# Patient Record
Sex: Male | Born: 1940 | Race: Black or African American | Hispanic: No | Marital: Single | Smoking: Current some day smoker
Health system: Southern US, Community
[De-identification: ages and names within clinical notes are randomized; demographics above are authoritative.]

## PROBLEM LIST (undated history)

## (undated) VITALS — BP 127/83 | HR 62 | Temp 97.0°F | Resp 18 | Ht 63.0 in | Wt 144.0 lb

## (undated) DIAGNOSIS — F419 Anxiety disorder, unspecified: Secondary | ICD-10-CM

## (undated) DIAGNOSIS — K769 Liver disease, unspecified: Secondary | ICD-10-CM

## (undated) DIAGNOSIS — E785 Hyperlipidemia, unspecified: Secondary | ICD-10-CM

## (undated) DIAGNOSIS — J449 Chronic obstructive pulmonary disease, unspecified: Secondary | ICD-10-CM

## (undated) DIAGNOSIS — K9189 Other postprocedural complications and disorders of digestive system: Secondary | ICD-10-CM

## (undated) DIAGNOSIS — M1990 Unspecified osteoarthritis, unspecified site: Secondary | ICD-10-CM

## (undated) DIAGNOSIS — M2550 Pain in unspecified joint: Secondary | ICD-10-CM

## (undated) DIAGNOSIS — K449 Diaphragmatic hernia without obstruction or gangrene: Secondary | ICD-10-CM

## (undated) DIAGNOSIS — F32A Depression, unspecified: Secondary | ICD-10-CM

## (undated) DIAGNOSIS — F329 Major depressive disorder, single episode, unspecified: Secondary | ICD-10-CM

## (undated) DIAGNOSIS — I480 Paroxysmal atrial fibrillation: Secondary | ICD-10-CM

## (undated) DIAGNOSIS — I10 Essential (primary) hypertension: Secondary | ICD-10-CM

## (undated) DIAGNOSIS — R0602 Shortness of breath: Secondary | ICD-10-CM

## (undated) DIAGNOSIS — Z933 Colostomy status: Secondary | ICD-10-CM

## (undated) DIAGNOSIS — I2510 Atherosclerotic heart disease of native coronary artery without angina pectoris: Secondary | ICD-10-CM

## (undated) DIAGNOSIS — F1010 Alcohol abuse, uncomplicated: Secondary | ICD-10-CM

## (undated) DIAGNOSIS — F99 Mental disorder, not otherwise specified: Secondary | ICD-10-CM

## (undated) DIAGNOSIS — I209 Angina pectoris, unspecified: Secondary | ICD-10-CM

## (undated) DIAGNOSIS — K567 Ileus, unspecified: Secondary | ICD-10-CM

## (undated) HISTORY — PX: ABDOMINAL SURGERY: SHX537

## (undated) HISTORY — DX: Colostomy status: Z93.3

## (undated) HISTORY — DX: Liver disease, unspecified: K76.9

## (undated) HISTORY — DX: Alcohol abuse, uncomplicated: F10.10

---

## 2005-10-03 ENCOUNTER — Emergency Department (HOSPITAL_COMMUNITY): Admission: EM | Admit: 2005-10-03 | Discharge: 2005-10-03 | Payer: Self-pay | Admitting: Emergency Medicine

## 2006-11-11 ENCOUNTER — Emergency Department (HOSPITAL_COMMUNITY): Admission: EM | Admit: 2006-11-11 | Discharge: 2006-11-11 | Payer: Self-pay | Admitting: Emergency Medicine

## 2007-05-13 ENCOUNTER — Emergency Department (HOSPITAL_COMMUNITY): Admission: EM | Admit: 2007-05-13 | Discharge: 2007-05-13 | Payer: Self-pay | Admitting: Emergency Medicine

## 2007-05-26 ENCOUNTER — Emergency Department (HOSPITAL_COMMUNITY): Admission: EM | Admit: 2007-05-26 | Discharge: 2007-05-26 | Payer: Self-pay | Admitting: *Deleted

## 2008-03-20 ENCOUNTER — Inpatient Hospital Stay (HOSPITAL_COMMUNITY): Admission: EM | Admit: 2008-03-20 | Discharge: 2008-03-20 | Payer: Self-pay | Admitting: Emergency Medicine

## 2008-08-04 ENCOUNTER — Ambulatory Visit: Payer: Self-pay | Admitting: Cardiology

## 2008-08-05 ENCOUNTER — Inpatient Hospital Stay (HOSPITAL_COMMUNITY): Admission: EM | Admit: 2008-08-05 | Discharge: 2008-08-09 | Payer: Self-pay | Admitting: Emergency Medicine

## 2008-08-05 ENCOUNTER — Encounter (INDEPENDENT_AMBULATORY_CARE_PROVIDER_SITE_OTHER): Payer: Self-pay | Admitting: Internal Medicine

## 2008-10-27 ENCOUNTER — Emergency Department (HOSPITAL_COMMUNITY): Admission: EM | Admit: 2008-10-27 | Discharge: 2008-10-27 | Payer: Self-pay | Admitting: Emergency Medicine

## 2008-12-01 ENCOUNTER — Ambulatory Visit: Payer: Self-pay | Admitting: Internal Medicine

## 2008-12-01 DIAGNOSIS — B1910 Unspecified viral hepatitis B without hepatic coma: Secondary | ICD-10-CM | POA: Insufficient documentation

## 2008-12-01 DIAGNOSIS — I10 Essential (primary) hypertension: Secondary | ICD-10-CM | POA: Insufficient documentation

## 2008-12-01 DIAGNOSIS — Z87898 Personal history of other specified conditions: Secondary | ICD-10-CM | POA: Insufficient documentation

## 2008-12-01 DIAGNOSIS — M129 Arthropathy, unspecified: Secondary | ICD-10-CM | POA: Insufficient documentation

## 2008-12-01 DIAGNOSIS — J449 Chronic obstructive pulmonary disease, unspecified: Secondary | ICD-10-CM

## 2008-12-01 DIAGNOSIS — J4489 Other specified chronic obstructive pulmonary disease: Secondary | ICD-10-CM | POA: Insufficient documentation

## 2008-12-01 DIAGNOSIS — K7030 Alcoholic cirrhosis of liver without ascites: Secondary | ICD-10-CM

## 2009-01-01 ENCOUNTER — Ambulatory Visit: Payer: Self-pay | Admitting: Internal Medicine

## 2009-03-05 ENCOUNTER — Encounter (INDEPENDENT_AMBULATORY_CARE_PROVIDER_SITE_OTHER): Payer: Self-pay | Admitting: Cardiovascular Disease

## 2009-03-05 ENCOUNTER — Observation Stay (HOSPITAL_COMMUNITY): Admission: EM | Admit: 2009-03-05 | Discharge: 2009-03-05 | Payer: Self-pay | Admitting: *Deleted

## 2009-03-24 ENCOUNTER — Emergency Department (HOSPITAL_COMMUNITY): Admission: EM | Admit: 2009-03-24 | Discharge: 2009-03-24 | Payer: Self-pay | Admitting: Emergency Medicine

## 2009-07-27 ENCOUNTER — Emergency Department (HOSPITAL_COMMUNITY): Admission: EM | Admit: 2009-07-27 | Discharge: 2009-07-27 | Payer: Self-pay | Admitting: Emergency Medicine

## 2009-07-29 ENCOUNTER — Inpatient Hospital Stay (HOSPITAL_COMMUNITY): Admission: EM | Admit: 2009-07-29 | Discharge: 2009-07-30 | Payer: Self-pay | Admitting: Emergency Medicine

## 2009-08-06 ENCOUNTER — Emergency Department (HOSPITAL_COMMUNITY): Admission: EM | Admit: 2009-08-06 | Discharge: 2009-08-06 | Payer: Self-pay | Admitting: Emergency Medicine

## 2009-08-17 ENCOUNTER — Emergency Department (HOSPITAL_COMMUNITY): Admission: EM | Admit: 2009-08-17 | Discharge: 2009-08-17 | Payer: Self-pay | Admitting: Emergency Medicine

## 2009-09-06 ENCOUNTER — Emergency Department (HOSPITAL_COMMUNITY): Admission: EM | Admit: 2009-09-06 | Discharge: 2009-09-06 | Payer: Self-pay | Admitting: Emergency Medicine

## 2009-09-17 ENCOUNTER — Encounter (INDEPENDENT_AMBULATORY_CARE_PROVIDER_SITE_OTHER): Payer: Self-pay | Admitting: Orthopedic Surgery

## 2009-09-17 ENCOUNTER — Ambulatory Visit (HOSPITAL_COMMUNITY): Admission: RE | Admit: 2009-09-17 | Discharge: 2009-09-18 | Payer: Self-pay | Admitting: Orthopedic Surgery

## 2010-01-05 ENCOUNTER — Inpatient Hospital Stay (HOSPITAL_COMMUNITY): Admission: EM | Admit: 2010-01-05 | Discharge: 2010-01-07 | Payer: Self-pay | Admitting: Emergency Medicine

## 2010-01-07 ENCOUNTER — Encounter (INDEPENDENT_AMBULATORY_CARE_PROVIDER_SITE_OTHER): Payer: Self-pay | Admitting: Internal Medicine

## 2010-01-08 HISTORY — PX: ESOPHAGOGASTRODUODENOSCOPY: SHX1529

## 2010-01-08 HISTORY — PX: COLONOSCOPY: SHX174

## 2010-06-17 ENCOUNTER — Emergency Department (HOSPITAL_COMMUNITY): Admission: EM | Admit: 2010-06-17 | Discharge: 2010-06-17 | Payer: Self-pay | Admitting: Emergency Medicine

## 2010-06-29 ENCOUNTER — Emergency Department (HOSPITAL_COMMUNITY): Admission: EM | Admit: 2010-06-29 | Discharge: 2010-06-30 | Payer: Self-pay | Admitting: Emergency Medicine

## 2010-07-01 ENCOUNTER — Emergency Department (HOSPITAL_COMMUNITY): Admission: EM | Admit: 2010-07-01 | Discharge: 2010-07-01 | Payer: Self-pay | Admitting: Family Medicine

## 2010-09-19 ENCOUNTER — Emergency Department (HOSPITAL_COMMUNITY): Admission: EM | Admit: 2010-09-19 | Discharge: 2010-09-20 | Payer: Self-pay | Admitting: Emergency Medicine

## 2010-09-28 ENCOUNTER — Emergency Department (HOSPITAL_COMMUNITY): Admission: EM | Admit: 2010-09-28 | Discharge: 2010-09-28 | Payer: Self-pay | Admitting: Emergency Medicine

## 2010-11-11 ENCOUNTER — Ambulatory Visit (HOSPITAL_COMMUNITY)
Admission: RE | Admit: 2010-11-11 | Discharge: 2010-11-11 | Payer: Self-pay | Source: Home / Self Care | Attending: Cardiovascular Disease | Admitting: Cardiovascular Disease

## 2010-12-15 ENCOUNTER — Encounter
Admission: RE | Admit: 2010-12-15 | Discharge: 2010-12-15 | Payer: Self-pay | Source: Home / Self Care | Attending: Cardiovascular Disease | Admitting: Cardiovascular Disease

## 2010-12-18 ENCOUNTER — Emergency Department (HOSPITAL_COMMUNITY)
Admission: EM | Admit: 2010-12-18 | Discharge: 2010-12-18 | Payer: Self-pay | Source: Home / Self Care | Admitting: Emergency Medicine

## 2011-01-04 NOTE — Procedures (Signed)
  NAMEEITHEN, CASTIGLIA             ACCOUNT NO.:  [id number]  MEDICAL RECORD NO.:  [id number]          PATIENT TYPE:  OIB  LOCATION:  2899                         FACILITY:  [facility]  PHYSICIAN:  Ricki Rodriguez, M.D.  DATE OF BIRTH:  Feb 12, 1941  DATE OF PROCEDURE:  11/11/2010 DATE OF DISCHARGE:  11/11/2010                           CARDIAC CATHETERIZATION   PROCEDURE DONE BY:  Ricki Rodriguez, MD  INDICATIONS:  Recurrent chest pain and exertional dyspnea.  DESCRIPTION OF THE PROCEDURE:  The patient was brought to the cardiac catheterization laboratory.  His right groin was prepped and draped in usual sterile fashion.  Xylocaine 1% was used for local anesthesia. Using modified Seldinger technique, right femoral artery sheath was placed without difficulty.  Preformed catheters were used to view left and right coronary angiography.  Using pigtail catheter, pressures were measured across the aortic valve and left ventricle.  Left ventriculogram was done using 20 mL of dye at 10 mL per second.  Post procedure, the patient had no complications.  FINDINGS:  Hemodynamic data:  The left ventricular pressure was 148/18 and aortic pressure was 138/76.  Coronary anatomy:  The left main coronary artery was unremarkable.  Left anterior descending coronary artery:  The left anterior descending coronary artery had proximal luminal irregularities and its diagonal vessel was unremarkable.  Left circumflex coronary artery:  The left circumflex coronary artery showed proximal calcification and luminal irregularities.  Midvessel showed 50-60% tapering stenosis of his marginal branch 2 and ramus branch were essentially unremarkable.  Right coronary artery:  The right coronary artery showed proximal 20-30% lesion along with a significant calcification.  The bend of the artery had a 20% lesions x2, midvessel had 20% lesion, and distal vessel had 20% lesion.  The posterolateral branch was  unremarkable and posterior descending coronary artery was also unremarkable.  Left ventriculogram:  The left ventriculogram was unremarkable with ejection fraction of 70%.  IMPRESSION: 1. 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 2. Normal left ventricular systolic function.  RECOMMENDATIONS:  This patient will be treated medically with increasing cholesterol-reducing medication and continuing smoking cessation.     Ricki Rodriguez, M.D.     ASK/MEDQ  D:  12/29/2010  T:  12/30/2010  Job:  [id number]  Electronically Signed by Orpah Cobb M.D. on 01/04/2011 05:19:28 PM

## 2011-02-08 ENCOUNTER — Emergency Department (HOSPITAL_COMMUNITY): Payer: Medicare Other

## 2011-02-08 ENCOUNTER — Emergency Department (HOSPITAL_COMMUNITY)
Admission: EM | Admit: 2011-02-08 | Discharge: 2011-02-08 | Disposition: A | Discharge status: Home / Self Care | Payer: Medicare Other | Attending: Emergency Medicine | Admitting: Emergency Medicine

## 2011-02-08 DIAGNOSIS — R0789 Other chest pain: Secondary | ICD-10-CM | POA: Insufficient documentation

## 2011-02-08 DIAGNOSIS — J45909 Unspecified asthma, uncomplicated: Secondary | ICD-10-CM | POA: Insufficient documentation

## 2011-02-08 DIAGNOSIS — K219 Gastro-esophageal reflux disease without esophagitis: Secondary | ICD-10-CM | POA: Insufficient documentation

## 2011-02-08 DIAGNOSIS — F1010 Alcohol abuse, uncomplicated: Secondary | ICD-10-CM | POA: Insufficient documentation

## 2011-02-08 DIAGNOSIS — I4891 Unspecified atrial fibrillation: Secondary | ICD-10-CM | POA: Insufficient documentation

## 2011-02-08 DIAGNOSIS — I10 Essential (primary) hypertension: Secondary | ICD-10-CM | POA: Insufficient documentation

## 2011-02-08 LAB — POCT I-STAT, CHEM 8
BUN: 11 mg/dL (ref 6–23)
Creatinine, Ser: 1.6 mg/dL — ABNORMAL HIGH (ref 0.4–1.5)
Glucose, Bld: 81 mg/dL (ref 70–99)
Potassium: 4 mEq/L (ref 3.5–5.1)
Sodium: 142 mEq/L (ref 135–145)

## 2011-02-08 LAB — POCT CARDIAC MARKERS
CKMB, poc: 2.2 ng/mL (ref 1.0–8.0)
Myoglobin, poc: 92.1 ng/mL (ref 12–200)
Myoglobin, poc: 101 ng/mL (ref 12–200)

## 2011-02-08 LAB — RAPID URINE DRUG SCREEN, HOSP PERFORMED: Barbiturates: NOT DETECTED

## 2011-02-08 LAB — ETHANOL: Alcohol, Ethyl (B): 361 mg/dL — ABNORMAL HIGH (ref 0–10)

## 2011-02-13 LAB — CBC
HCT: 33.6 % — ABNORMAL LOW (ref 39.0–52.0)
MCH: 26 pg (ref 26.0–34.0)
MCV: 82.6 fL (ref 78.0–100.0)
Platelets: 314 10*3/uL (ref 150–400)
RDW: 17.4 % — ABNORMAL HIGH (ref 11.5–15.5)

## 2011-02-13 LAB — SURGICAL PCR SCREEN
MRSA, PCR: NEGATIVE
Staphylococcus aureus: POSITIVE — AB

## 2011-02-13 LAB — BASIC METABOLIC PANEL
BUN: 14 mg/dL (ref 6–23)
CO2: 25 mEq/L (ref 19–32)
Chloride: 103 mEq/L (ref 96–112)
Creatinine, Ser: 1.22 mg/dL (ref 0.4–1.5)
Glucose, Bld: 95 mg/dL (ref 70–99)
Potassium: 4.8 mEq/L (ref 3.5–5.1)

## 2011-02-18 LAB — CBC
Hemoglobin: 11 g/dL — ABNORMAL LOW (ref 13.0–17.0)
MCH: 28.6 pg (ref 26.0–34.0)
Platelets: 328 10*3/uL (ref 150–400)
RBC: 3.85 MIL/uL — ABNORMAL LOW (ref 4.22–5.81)
WBC: 8.8 10*3/uL (ref 4.0–10.5)

## 2011-02-18 LAB — RAPID URINE DRUG SCREEN, HOSP PERFORMED
Amphetamines: NOT DETECTED
Benzodiazepines: NOT DETECTED
Tetrahydrocannabinol: NOT DETECTED

## 2011-02-18 LAB — ETHANOL: Alcohol, Ethyl (B): 196 mg/dL — ABNORMAL HIGH (ref 0–10)

## 2011-02-18 LAB — BASIC METABOLIC PANEL
CO2: 28 mEq/L (ref 19–32)
Calcium: 9 mg/dL (ref 8.4–10.5)
Chloride: 103 mEq/L (ref 96–112)
Creatinine, Ser: 1.23 mg/dL (ref 0.4–1.5)
GFR calc Af Amer: >60 mL/min (ref >60)
Sodium: 139 mEq/L (ref 135–145)

## 2011-02-18 LAB — CK TOTAL AND CKMB (NOT AT ARMC)
CK, MB: 3.8 ng/mL (ref 0.3–4.0)
Relative Index: INVALID (ref 0.0–2.5)

## 2011-02-18 LAB — PROTIME-INR
INR: 1.02 (ref 0.00–1.49)
Prothrombin Time: 13.3 seconds (ref 11.6–15.2)

## 2011-02-22 LAB — DIFFERENTIAL
Eosinophils Absolute: 0 10*3/uL (ref 0.0–0.7)
Lymphs Abs: 4.2 10*3/uL — ABNORMAL HIGH (ref 0.7–4.0)
Monocytes Absolute: 0.5 10*3/uL (ref 0.1–1.0)
Monocytes Relative: 5 % (ref 3–12)
Neutro Abs: 5.3 10*3/uL (ref 1.7–7.7)
Neutrophils Relative %: 53 % (ref 43–77)

## 2011-02-22 LAB — RETICULOCYTES
RBC.: 3.49 MIL/uL — ABNORMAL LOW (ref 4.22–5.81)
Retic Count, Absolute: 41.9 10*3/uL (ref 19.0–186.0)
Retic Ct Pct: 1.2 % (ref 0.4–3.1)

## 2011-02-22 LAB — CBC
HCT: 27.4 % — ABNORMAL LOW (ref 39.0–52.0)
HCT: 29.2 % — ABNORMAL LOW (ref 39.0–52.0)
Hemoglobin: 8.6 g/dL — ABNORMAL LOW (ref 13.0–17.0)
Hemoglobin: 8.7 g/dL — ABNORMAL LOW (ref 13.0–17.0)
MCHC: 31.2 g/dL (ref 30.0–36.0)
MCHC: 31.9 g/dL (ref 30.0–36.0)
MCV: 82.4 fL (ref 78.0–100.0)
MCV: 82.5 fL (ref 78.0–100.0)
Platelets: 283 10*3/uL (ref 150–400)
Platelets: 311 10*3/uL (ref 150–400)
Platelets: 317 10*3/uL (ref 150–400)
Platelets: 376 10*3/uL (ref 150–400)
RDW: 17.4 % — ABNORMAL HIGH (ref 11.5–15.5)
RDW: 17.5 % — ABNORMAL HIGH (ref 11.5–15.5)
WBC: 10.4 10*3/uL (ref 4.0–10.5)
WBC: 9.2 10*3/uL (ref 4.0–10.5)
WBC: 9.3 10*3/uL (ref 4.0–10.5)

## 2011-02-22 LAB — URINALYSIS, ROUTINE W REFLEX MICROSCOPIC
Bilirubin Urine: NEGATIVE
Glucose, UA: NEGATIVE mg/dL
Hgb urine dipstick: NEGATIVE
Specific Gravity, Urine: 1.004 — ABNORMAL LOW (ref 1.005–1.030)

## 2011-02-22 LAB — COMPREHENSIVE METABOLIC PANEL
ALT: 32 U/L (ref 0–53)
AST: 33 U/L (ref 0–37)
Albumin: 2.7 g/dL — ABNORMAL LOW (ref 3.5–5.2)
Albumin: 3.4 g/dL — ABNORMAL LOW (ref 3.5–5.2)
Alkaline Phosphatase: 60 U/L (ref 39–117)
Calcium: 9.1 mg/dL (ref 8.4–10.5)
Chloride: 112 mEq/L (ref 96–112)
Creatinine, Ser: 1.11 mg/dL (ref 0.4–1.5)
GFR calc Af Amer: >60 mL/min (ref >60)
GFR calc Af Amer: >60 mL/min (ref >60)
Glucose, Bld: 74 mg/dL (ref 70–99)
Potassium: 3.9 mEq/L (ref 3.5–5.1)
Potassium: 3.9 mEq/L (ref 3.5–5.1)
Sodium: 140 mEq/L (ref 135–145)
Total Bilirubin: 0.9 mg/dL (ref 0.3–1.2)
Total Protein: 7.6 g/dL (ref 6.0–8.3)

## 2011-02-22 LAB — RAPID URINE DRUG SCREEN, HOSP PERFORMED
Benzodiazepines: NOT DETECTED
Cocaine: NOT DETECTED
Tetrahydrocannabinol: POSITIVE — AB

## 2011-02-22 LAB — BASIC METABOLIC PANEL
BUN: 3 mg/dL — ABNORMAL LOW (ref 6–23)
BUN: 6 mg/dL (ref 6–23)
CO2: 29 mEq/L (ref 19–32)
Chloride: 105 mEq/L (ref 96–112)
Chloride: 106 mEq/L (ref 96–112)
Creatinine, Ser: 1.04 mg/dL (ref 0.4–1.5)
Glucose, Bld: 113 mg/dL — ABNORMAL HIGH (ref 70–99)
Potassium: 3.9 mEq/L (ref 3.5–5.1)
Sodium: 137 mEq/L (ref 135–145)

## 2011-02-22 LAB — CROSSMATCH
ABO/RH(D): O POS
Antibody Screen: NEGATIVE

## 2011-02-22 LAB — APTT: aPTT: 27 seconds (ref 24–37)

## 2011-02-22 LAB — ETHANOL: Alcohol, Ethyl (B): 161 mg/dL — ABNORMAL HIGH (ref 0–10)

## 2011-02-22 LAB — FOLATE: Folate: 18.4 ng/mL

## 2011-02-22 LAB — HEMOCCULT GUIAC POC 1CARD (OFFICE): Fecal Occult Bld: NEGATIVE

## 2011-03-09 LAB — ANAEROBIC CULTURE

## 2011-03-09 LAB — CULTURE, FUNGUS WITHOUT SMEAR

## 2011-03-09 LAB — CBC
HCT: 31.8 % — ABNORMAL LOW (ref 39.0–52.0)
MCV: 99.8 fL (ref 78.0–100.0)
Platelets: 242 10*3/uL (ref 150–400)
RBC: 3.18 MIL/uL — ABNORMAL LOW (ref 4.22–5.81)
RDW: 13.9 % (ref 11.5–15.5)
WBC: 8.8 10*3/uL (ref 4.0–10.5)
WBC: 10.4 10*3/uL (ref 4.0–10.5)

## 2011-03-09 LAB — AFB CULTURE WITH SMEAR (NOT AT ARMC): Acid Fast Smear: NONE SEEN

## 2011-03-09 LAB — DIFFERENTIAL
Eosinophils Absolute: 0.1 10*3/uL (ref 0.0–0.7)
Eosinophils Relative: 1 % (ref 0–5)
Lymphocytes Relative: 30 % (ref 12–46)
Lymphs Abs: 3.1 10*3/uL (ref 0.7–4.0)
Monocytes Relative: 8 % (ref 3–12)

## 2011-03-09 LAB — GLUCOSE, CAPILLARY: Glucose-Capillary: 50 mg/dL — ABNORMAL LOW (ref 70–99)

## 2011-03-09 LAB — GRAM STAIN

## 2011-03-09 LAB — WOUND CULTURE

## 2011-03-09 LAB — BASIC METABOLIC PANEL
Chloride: 109 mEq/L (ref 96–112)
GFR calc Af Amer: >60 mL/min (ref >60)
GFR calc non Af Amer: >60 mL/min (ref >60)
Potassium: 4.1 mEq/L (ref 3.5–5.1)
Sodium: 140 mEq/L (ref 135–145)

## 2011-03-10 LAB — COMPREHENSIVE METABOLIC PANEL
ALT: 33 U/L (ref 0–53)
AST: 34 U/L (ref 0–37)
Alkaline Phosphatase: 60 U/L (ref 39–117)
CO2: 29 mEq/L (ref 19–32)
Calcium: 8.9 mg/dL (ref 8.4–10.5)
GFR calc Af Amer: 59 mL/min — ABNORMAL LOW (ref >60)
GFR calc non Af Amer: 49 mL/min — ABNORMAL LOW (ref >60)
Potassium: 4.3 mEq/L (ref 3.5–5.1)
Sodium: 136 mEq/L (ref 135–145)

## 2011-03-10 LAB — ANAEROBIC CULTURE: Gram Stain: NONE SEEN

## 2011-03-10 LAB — CULTURE, ROUTINE-ABSCESS

## 2011-03-10 LAB — CK TOTAL AND CKMB (NOT AT ARMC): Relative Index: INVALID (ref 0.0–2.5)

## 2011-03-10 LAB — CBC
HCT: 38.8 % — ABNORMAL LOW (ref 39.0–52.0)
Hemoglobin: 13.3 g/dL (ref 13.0–17.0)
MCHC: 34.4 g/dL (ref 30.0–36.0)
MCV: 99.2 fL (ref 78.0–100.0)
RBC: 3.91 MIL/uL — ABNORMAL LOW (ref 4.22–5.81)
WBC: 9.9 10*3/uL (ref 4.0–10.5)

## 2011-03-10 LAB — DIFFERENTIAL
Basophils Relative: 0 % (ref 0–1)
Eosinophils Absolute: 0.1 10*3/uL (ref 0.0–0.7)
Eosinophils Relative: 1 % (ref 0–5)
Lymphs Abs: 2.9 10*3/uL (ref 0.7–4.0)
Monocytes Relative: 6 % (ref 3–12)

## 2011-03-10 LAB — TROPONIN I: Troponin I: 0.01 ng/mL (ref 0.00–0.06)

## 2011-03-11 LAB — APTT
aPTT: 28 seconds (ref 24–37)
aPTT: 76 seconds — ABNORMAL HIGH (ref 24–37)

## 2011-03-11 LAB — CARDIAC PANEL(CRET KIN+CKTOT+MB+TROPI)
CK, MB: 2.1 ng/mL (ref 0.3–4.0)
Relative Index: INVALID (ref 0.0–2.5)
Total CK: 60 U/L (ref 7–232)
Troponin I: 0.02 ng/mL (ref 0.00–0.06)
Troponin I: 0.02 ng/mL (ref 0.00–0.06)

## 2011-03-11 LAB — CBC
Hemoglobin: 14.4 g/dL (ref 13.0–17.0)
Hemoglobin: 14.8 g/dL (ref 13.0–17.0)
Platelets: 197 10*3/uL (ref 150–400)
RBC: 4.34 MIL/uL (ref 4.22–5.81)
RDW: 13.7 % (ref 11.5–15.5)
RDW: 14 % (ref 11.5–15.5)
WBC: 8.6 10*3/uL (ref 4.0–10.5)

## 2011-03-11 LAB — DIFFERENTIAL
Basophils Absolute: 0 10*3/uL (ref 0.0–0.1)
Lymphocytes Relative: 44 % (ref 12–46)
Monocytes Absolute: 0.6 10*3/uL (ref 0.1–1.0)
Monocytes Relative: 7 % (ref 3–12)
Neutro Abs: 4.1 10*3/uL (ref 1.7–7.7)

## 2011-03-11 LAB — CK TOTAL AND CKMB (NOT AT ARMC): CK, MB: 2.2 ng/mL (ref 0.3–4.0)

## 2011-03-11 LAB — LIPID PANEL
LDL Cholesterol: 85 mg/dL (ref 0–99)
VLDL: 15 mg/dL (ref 0–40)

## 2011-03-11 LAB — BASIC METABOLIC PANEL
Calcium: 9.1 mg/dL (ref 8.4–10.5)
GFR calc Af Amer: >60 mL/min (ref >60)
GFR calc non Af Amer: 52 mL/min — ABNORMAL LOW (ref >60)
Sodium: 139 mEq/L (ref 135–145)

## 2011-03-11 LAB — POCT CARDIAC MARKERS
CKMB, poc: 1.1 ng/mL (ref 1.0–8.0)
Troponin i, poc: <0.05 ng/mL (ref 0.00–0.09)
Troponin i, poc: <0.05 ng/mL (ref 0.00–0.09)

## 2011-03-11 LAB — RAPID URINE DRUG SCREEN, HOSP PERFORMED
Amphetamines: NOT DETECTED
Benzodiazepines: POSITIVE — AB

## 2011-03-11 LAB — TROPONIN I: Troponin I: 0.02 ng/mL (ref 0.00–0.06)

## 2011-03-11 LAB — HEPARIN LEVEL (UNFRACTIONATED): Heparin Unfractionated: 0.29 IU/mL — ABNORMAL LOW (ref 0.30–0.70)

## 2011-03-15 LAB — POCT I-STAT, CHEM 8
Calcium, Ion: 1.15 mmol/L (ref 1.12–1.32)
Chloride: 104 mEq/L (ref 96–112)
Creatinine, Ser: 1.1 mg/dL (ref 0.4–1.5)
Glucose, Bld: 91 mg/dL (ref 70–99)
Potassium: 3.6 mEq/L (ref 3.5–5.1)

## 2011-03-15 LAB — CARDIAC PANEL(CRET KIN+CKTOT+MB+TROPI)
CK, MB: 3.2 ng/mL (ref 0.3–4.0)
Relative Index: INVALID (ref 0.0–2.5)
Troponin I: 0.03 ng/mL (ref 0.00–0.06)

## 2011-03-15 LAB — LIPID PANEL
HDL: 35 mg/dL — ABNORMAL LOW (ref >39)
Total CHOL/HDL Ratio: 3.8 RATIO
Triglycerides: 47 mg/dL (ref <150)

## 2011-03-15 LAB — RAPID URINE DRUG SCREEN, HOSP PERFORMED
Barbiturates: NOT DETECTED
Opiates: NOT DETECTED
Tetrahydrocannabinol: NOT DETECTED

## 2011-03-15 LAB — CBC
HCT: 48.2 % (ref 39.0–52.0)
Hemoglobin: 16.2 g/dL (ref 13.0–17.0)
MCHC: 33.6 g/dL (ref 30.0–36.0)
MCV: 97.2 fL (ref 78.0–100.0)
RDW: 14.2 % (ref 11.5–15.5)

## 2011-03-15 LAB — URINALYSIS, ROUTINE W REFLEX MICROSCOPIC
Nitrite: NEGATIVE
Specific Gravity, Urine: 1.001 — ABNORMAL LOW (ref 1.005–1.030)
Urobilinogen, UA: 0.2 mg/dL (ref 0.0–1.0)

## 2011-03-15 LAB — DIFFERENTIAL
Basophils Absolute: 0.1 10*3/uL (ref 0.0–0.1)
Basophils Relative: 1 % (ref 0–1)
Eosinophils Relative: 2 % (ref 0–5)
Monocytes Absolute: 0.9 10*3/uL (ref 0.1–1.0)
Neutro Abs: 5.4 10*3/uL (ref 1.7–7.7)

## 2011-03-15 LAB — PROTIME-INR: Prothrombin Time: 12.4 seconds (ref 11.6–15.2)

## 2011-03-15 LAB — TSH: TSH: 1.007 u[IU]/mL (ref 0.350–4.500)

## 2011-03-15 LAB — POCT CARDIAC MARKERS
CKMB, poc: 2.3 ng/mL (ref 1.0–8.0)
Troponin i, poc: <0.05 ng/mL (ref 0.00–0.09)

## 2011-03-15 LAB — CK TOTAL AND CKMB (NOT AT ARMC)
CK, MB: 3.9 ng/mL (ref 0.3–4.0)
Relative Index: 3.5 — ABNORMAL HIGH (ref 0.0–2.5)
Total CK: 112 U/L (ref 7–232)

## 2011-04-18 NOTE — Discharge Summary (Signed)
NAMESELESTINO, Zachary [name]             ACCOUNT NO.:  [id number]   MEDICAL RECORD NO.:  [id number]          PATIENT TYPE:  INP   LOCATION:  1401                         FACILITY:  [facility]   PHYSICIAN:  Ricki Rodriguez, M.D.  DATE OF BIRTH:  1941-01-16   DATE OF ADMISSION:  07/29/2009  DATE OF DISCHARGE:  07/30/2009                               DISCHARGE SUMMARY   FINAL DIAGNOSES:  1. Chest pain.  2. Hypertension.  3. Alcohol use disorder.  4. Marijuana and cocaine use disorder.   DISCHARGE MEDICATIONS:  1. Tylenol 325 mg 4 times daily as needed.  2. Albuterol 90 mcg inhaler 2 puffs 4 times daily as needed.  3. Aspirin 325 mg one daily.  4. Plavix 75 mg one daily.  5. Hydrochlorothiazide and Benicar as 10/6.25 mg daily.  6. Imdur 30 mg daily.  7. Multivitamin with iron with mineral tablet one daily.  8. Nitroglycerin 0.4 mg tablet once sublingual every 5 minutes x3 as      needed for chest pain.  9. Darvocet-N 100 one twice daily as needed for pain.  10.Colchicine 0.6 mg one twice daily.  11.Toprol-XL 25 mg one daily.  12.Simvastatin 40 mg one in the evening.   DISCHARGE DIET:  Low-sodium, heart-healthy diet.   DISCHARGE ACTIVITY:  The patient is doing his activity slowly.   FOLLOWUP:  Follow up by Dr. Orpah Cobb in 1 week.  The patient is to  call [phone] for appointment.   HISTORY:  This 70 year old white male presented with chest pain without  nausea or vomiting, shortness of breath, fever, or cough.  The patient  did have a drug screen positive for cocaine and marijuana.  He has a  past history positive for hypertension, alcohol intake, and  polysubstance abuse.   PHYSICAL EXAMINATION:  VITAL SIGNS:  Temperature 98.3, pulse 47,  respirations 20, blood pressure 181/97.  GENERAL:  The patient is a well-built, averagely-nourished white male,  in no acute distress.  HEENT:  The patient is normocephalic, atraumatic with hazel eyes.  Conjunctivae pink.  Sclerae are white.   Wears glasses and dentures.  NECK:  No JVD.  No bruit.  LUNGS:  Clear with end-expiratory wheeze.  HEART:  S1 and S2 normal.  ABDOMEN:  Soft.  EXTREMITIES:  No edema, cyanosis, or clubbing.  SKIN:  Warm and dry.  NEUROLOGIC:  The patient is alert and oriented x3 with bilateral equal  grips and cranial nerves grossly intact.  The patient is right handed.   LABORATORY DATA:  Normal hemoglobin, hematocrit, WBC count, platelet  count.  Normal electrolytes, BUN, creatinine.  Normal CK-MB, troponin I.  Chest x-ray, stable COPD, otherwise unremarkable.   HOSPITAL COURSE:  The patient was admitted to telemetry unit.  Myocardial infarction was ruled out.  He was started on Imdur and pain  medication like Darvocet.  His activities were monitored.  He had no  additional chest pain and he was discharged home in satisfactory  condition with outpatient nuclear stress test versus cardiac  catheterization.      Ricki Rodriguez, M.D.  Electronically Signed  ASK/MEDQ  D:  07/30/2009  T:  07/31/2009  Job:  [id number]
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
## 2011-04-18 NOTE — Discharge Summary (Signed)
Zachary [name], Zachary [name]             ACCOUNT NO.:  [id number]   MEDICAL RECORD NO.:  [id number]          PATIENT TYPE:  INP   LOCATION:  3001                         FACILITY:  [facility]   PHYSICIAN:  Lonia Blood, M.D.       DATE OF BIRTH:  09-25-1941   DATE OF ADMISSION:  08/04/2008  DATE OF DISCHARGE:  08/09/2008                               DISCHARGE SUMMARY   PRIMARY CARE PHYSICIAN:  This patient was referred to Dr. Della Goo.   DISCHARGE DIAGNOSES:  1. Cocaine-induced chest pain without myocardial infarction.  2. Hypertension, stage II.  3. Acute diastolic congestive heart failure - new diagnosis.  4. Chronic obstructive pulmonary disease with mild exacerbation.  5. Tobacco abuse.  6. Cocaine abuse.  7. Alcohol abuse with alcohol withdrawal delirium - resolved.  8. Right inguinal hernia.  9. History of cirrhosis - without clinical evidence of such currently.  10.Steroid-induced hyperglycemia.   DISCHARGE MEDICATIONS:  1. Aspirin 81 mg daily.  2. Nicotine patch 14 mg daily.  3. Multivitamin 1 daily.  4. Amlodipine 10 mg daily.  5. Ventolin 1 puff 3 times a day as needed.   CONDITION ON DISCHARGE:  Zachary [name] was discharged in good condition.  At the time of discharge, he was without any respiratory distress.  He  was alert and oriented without signs of any delirium.  The patient is  told to follow a low-sodium diet with water restriction and to follow  with Dr. Della Goo on August 12, 2008 at 3:45 p.m.  At the time  of the followup visit, the patient will need scheduling for pulmonary  function testing to assess his severity of COPD, the patient will need  recheck of his capillary blood glucose to assure that the steroid-  induced hyperglycemia has resolved, and the patient will need preop  evaluation for his right inguinal hernia which probably include a  restratification with an outpatient stress test for given his age and  multiple risk factors.  At the  time of discharge, the patient was told  to abstain from smoking, using cocaine, and drinking alcohol.   PROCEDURES DURING THIS ADMISSION:  1. August 04, 2008, the patient underwent a chest x-ray with      findings of COPD.  2. August 05, 2008, transthoracic echocardiogram with findings of      ejection fraction of 55-60%, features consistent with pseudonormal      left ventricular filling pattern with abnormal relaxation and      increasing pressure consistent with diastolic heart failure,      possible hypokinesis of the basal inferoposterior wall.   CONSULTATIONS DURING THIS ADMISSION:  No consultations obtained.   HISTORY AND PHYSICAL:  Refer to the dictated H&P which was done by Dr.  Vania Rea on August 04, 2008.   HOSPITAL COURSE:  1. Chest pain.  Zachary [name] presented to the emergency room with      complaints of chest pain.  He was admitted to a Telemetry Unit and      was observed closely for any signs of arrhythmia.  He did not  develop any malignant-type arrhythmias.  His cardiac markers were      checked frequently and his troponin I remained within normal      limits.  The patient's urinary drug screen confirmed the presence      of cocaine.  With discontinuation of the cocaine and treatment with      nitroglycerin and blood pressure lowering, Zachary [name] remained      without any chest pain throughout this admission.  2. Wheezing.  By August 06, 2008, the patient was found to have      significant wheezing.  He was probably suffering a COPD      exacerbation versus diastolic congestive heart failure      exacerbation.  The patient was treated with intravenous steroids      and nebulizers and he had some improvement, but remained with      paroxysmal nocturnal dyspnea.  At that point in time, we added      intravenous furosemide which resulted in complete resolution of the      patient's wheezing and no further recurrence of the paroxysmal       nocturnal dyspnea.  For now, Zachary [name] is to continue cocaine      abstinence, use blood pressure medication in the form of Norvasc,      taking aspirin on a daily basis, use a bronchodilator as needed,      and then further followup with the primary care physician for      further treatment in regards to his COPD, diastolic congestive      heart failure, and cocaine-induced chest pain.  3. Hyperglycemia.  The patient had a hemoglobin A1c, which was checked      at 5.7.  With starting steroids, the patient was hyperglycemic up      to 308.  Zachary [name] will need a recheck CBG, while not taking      steroids to be sure that he reverts back to normal.  4. Polysubstance abuse.  We have extensively counseled Zachary [name]      about the importance of stopping cocaine, alcohol, and cigarettes.  5. Reported history of cirrhosis.  The patient's liver function tests      were fairly normal.  The patient requested and tested for hepatitis      A, B, and C, results of which were pending currently.  6. Alcoholism with alcohol withdrawal syndrome.  Zachary [name] became      delirious by August 05, 2008.  He was treated with a Librium      protocol and vitamins and he is currently without signs of      withdrawal.      Lonia Blood, M.D.  Electronically Signed     SL/MEDQ  D:  08/09/2008  T:  08/09/2008  Job:  [id number]   cc:   Della Goo, M.D.

## 2011-04-21 NOTE — Discharge Summary (Signed)
Zachary [name], Zachary [name]             ACCOUNT NO.:  [id number]   MEDICAL RECORD NO.:  [id number]          PATIENT TYPE:  INP   LOCATION:  1437                         FACILITY:  [facility]   PHYSICIAN:  Ricki Rodriguez, M.D.  DATE OF BIRTH:  [birth year]   DATE OF ADMISSION:  03/05/2009  DATE OF DISCHARGE:  03/05/2009                               DISCHARGE SUMMARY   FINAL DIAGNOSES:  1. Supraventricular tachycardia.  2. Chest pain.  3. Cocaine use, deserted.   DISCHARGE MEDICATIONS:  1. Metoprolol 25 mg 1 twice daily.  2. Aspirin 81 mg daily.  3. Multivitamin 1 daily.   DISCHARGE DIET:  Low sodium, heart-healthy diet.   DISCHARGE ACTIVITY:  Increase activity slowly and stop any activity that  causes chest pain, shortness of breath, dizziness, sweating, or  excessive weakness.   FOLLOW UP:  By Dr. Orpah Cobb in 1 week.  Patient to call [phone] for  appointment.   HISTORY:  This 70 year old American [place] male had chest pain along  with palpitations.  His EKG showed supraventricular tachycardia that  converted to sinus rhythm with 4 mg of IV Adenosine.  He has a history  of alcohol use.   PHYSICAL EXAMINATION:  Temperature 98, pulse 135, followed by a sinus  rhythm of a rate of 63.  Respirations 18, blood pressure 120/60.  GENERAL:  Patient is averagely built and nourished.  HEENT:  Patient is normocephalic and atraumatic.  He has brown eyes.  Conjunctivae pink.  Sclerae are white.  Positive upper and lower  dentures.  NECK:  No JVD.  LUNGS:  Clear bilaterally.  HEART:  Normal S1 and S2.  ABDOMEN:  Distended but nontender with a scar of surgery.  EXTREMITIES:  No clubbing, cyanosis or edema.  SKIN:  Warm and dry.  NEUROLOGIC:  Patient moves all 4 extremities.   LABORATORY DATA:  Normal hemoglobin and hematocrit, borderline WBC  count, normal platelet count, normal electrolytes, BUN and creatinine.   EKG showed non-sustained ventricular tachycardia followed by sinus  rhythm.   Myoglobin was normal.  Troponin I was normal.  CK-MB was normal.  B-type  natriuretic peptide was borderline at 225.  Drug screen was positive for  cocaine use.   HOSPITAL COURSE:  Patient was placed in observation. He had an  ultrasound of his heart that showed normal LV systolic function with  mild aortic valve regurgitation and mild aortic root dilatation with  basal septal hypertrophy. He was started on a small dose of beta  blocker.  His condition remained stable overnight.  Hence, he was  discharged home on March 07, 2009 in satisfactory condition with followup  by me in 1 week.      Ricki Rodriguez, M.D.  Electronically Signed     ASK/MEDQ  D:  04/14/2009  T:  04/14/2009  Job:  [id number]

## 2011-04-26 ENCOUNTER — Emergency Department (HOSPITAL_COMMUNITY)
Admit: 2011-04-26 | Discharge: 2011-04-26 | Disposition: A | Discharge status: Other Acute Inpatient Hospital | Payer: Medicare Other | Attending: Emergency Medicine | Admitting: Emergency Medicine

## 2011-04-26 ENCOUNTER — Inpatient Hospital Stay (HOSPITAL_COMMUNITY)
Admission: AD | Admit: 2011-04-26 | Discharge: 2011-05-09 | DRG: 897 | Disposition: A | Discharge status: Home Health | Payer: 59 | Source: Ambulatory Visit | Attending: Psychiatry | Admitting: Psychiatry

## 2011-04-26 ENCOUNTER — Emergency Department (HOSPITAL_COMMUNITY): Payer: Medicare Other

## 2011-04-26 DIAGNOSIS — R42 Dizziness and giddiness: Secondary | ICD-10-CM | POA: Insufficient documentation

## 2011-04-26 DIAGNOSIS — I10 Essential (primary) hypertension: Secondary | ICD-10-CM | POA: Insufficient documentation

## 2011-04-26 DIAGNOSIS — E871 Hypo-osmolality and hyponatremia: Secondary | ICD-10-CM | POA: Insufficient documentation

## 2011-04-26 DIAGNOSIS — R51 Headache: Secondary | ICD-10-CM | POA: Insufficient documentation

## 2011-04-26 DIAGNOSIS — I2510 Atherosclerotic heart disease of native coronary artery without angina pectoris: Secondary | ICD-10-CM

## 2011-04-26 DIAGNOSIS — M129 Arthropathy, unspecified: Secondary | ICD-10-CM | POA: Insufficient documentation

## 2011-04-26 DIAGNOSIS — J45909 Unspecified asthma, uncomplicated: Secondary | ICD-10-CM | POA: Insufficient documentation

## 2011-04-26 DIAGNOSIS — IMO0002 Reserved for concepts with insufficient information to code with codable children: Secondary | ICD-10-CM | POA: Insufficient documentation

## 2011-04-26 DIAGNOSIS — W19XXXA Unspecified fall, initial encounter: Secondary | ICD-10-CM

## 2011-04-26 DIAGNOSIS — W1809XA Striking against other object with subsequent fall, initial encounter: Secondary | ICD-10-CM | POA: Insufficient documentation

## 2011-04-26 DIAGNOSIS — Z79899 Other long term (current) drug therapy: Secondary | ICD-10-CM | POA: Insufficient documentation

## 2011-04-26 DIAGNOSIS — I4891 Unspecified atrial fibrillation: Secondary | ICD-10-CM | POA: Insufficient documentation

## 2011-04-26 DIAGNOSIS — D649 Anemia, unspecified: Secondary | ICD-10-CM

## 2011-04-26 DIAGNOSIS — M109 Gout, unspecified: Secondary | ICD-10-CM

## 2011-04-26 DIAGNOSIS — S0003XA Contusion of scalp, initial encounter: Secondary | ICD-10-CM

## 2011-04-26 DIAGNOSIS — Z7982 Long term (current) use of aspirin: Secondary | ICD-10-CM

## 2011-04-26 DIAGNOSIS — F1010 Alcohol abuse, uncomplicated: Secondary | ICD-10-CM | POA: Insufficient documentation

## 2011-04-26 DIAGNOSIS — S0990XA Unspecified injury of head, initial encounter: Secondary | ICD-10-CM | POA: Insufficient documentation

## 2011-04-26 DIAGNOSIS — Z8619 Personal history of other infectious and parasitic diseases: Secondary | ICD-10-CM | POA: Insufficient documentation

## 2011-04-26 DIAGNOSIS — F1020 Alcohol dependence, uncomplicated: Principal | ICD-10-CM

## 2011-04-26 DIAGNOSIS — S0180XA Unspecified open wound of other part of head, initial encounter: Secondary | ICD-10-CM | POA: Insufficient documentation

## 2011-04-26 DIAGNOSIS — G589 Mononeuropathy, unspecified: Secondary | ICD-10-CM

## 2011-04-26 LAB — RAPID URINE DRUG SCREEN, HOSP PERFORMED
Amphetamines: NOT DETECTED
Barbiturates: NOT DETECTED
Benzodiazepines: NOT DETECTED
Cocaine: NOT DETECTED
Opiates: NOT DETECTED

## 2011-04-26 LAB — COMPREHENSIVE METABOLIC PANEL
Alkaline Phosphatase: 102 U/L (ref 39–117)
BUN: 13 mg/dL (ref 6–23)
Chloride: 86 mEq/L — ABNORMAL LOW (ref 96–112)
Glucose, Bld: 79 mg/dL (ref 70–99)
Potassium: 4.8 mEq/L (ref 3.5–5.1)
Total Bilirubin: 0.5 mg/dL (ref 0.3–1.2)

## 2011-04-26 LAB — DIFFERENTIAL
Basophils Relative: 1 % (ref 0–1)
Eosinophils Absolute: 0 10*3/uL (ref 0.0–0.7)
Monocytes Absolute: 1 10*3/uL (ref 0.1–1.0)
Monocytes Relative: 12 % (ref 3–12)
Neutrophils Relative %: 66 % (ref 43–77)

## 2011-04-26 LAB — URINALYSIS, ROUTINE W REFLEX MICROSCOPIC
Bilirubin Urine: NEGATIVE
Glucose, UA: NEGATIVE mg/dL
Hgb urine dipstick: NEGATIVE
Ketones, ur: 15 mg/dL — AB
pH: 5.5 (ref 5.0–8.0)

## 2011-04-26 LAB — POCT I-STAT, CHEM 8
BUN: 15 mg/dL (ref 6–23)
Calcium, Ion: 1.1 mmol/L — ABNORMAL LOW (ref 1.12–1.32)
Creatinine, Ser: 1.5 mg/dL (ref 0.4–1.5)
Glucose, Bld: 163 mg/dL — ABNORMAL HIGH (ref 70–99)
Hemoglobin: 11.6 g/dL — ABNORMAL LOW (ref 13.0–17.0)
TCO2: 21 mmol/L (ref 0–100)

## 2011-04-26 LAB — POCT CARDIAC MARKERS

## 2011-04-26 LAB — CBC
MCH: 26.1 pg (ref 26.0–34.0)
MCHC: 34 g/dL (ref 30.0–36.0)
Platelets: 303 10*3/uL (ref 150–400)
RBC: 4.36 MIL/uL (ref 4.22–5.81)

## 2011-04-27 ENCOUNTER — Ambulatory Visit (HOSPITAL_COMMUNITY)
Admission: EM | Admit: 2011-04-27 | Discharge: 2011-04-28 | Disposition: A | Discharge status: Other Acute Inpatient Hospital | Payer: Medicare Other | Source: Ambulatory Visit | Attending: Emergency Medicine | Admitting: Emergency Medicine

## 2011-04-27 DIAGNOSIS — R059 Cough, unspecified: Secondary | ICD-10-CM | POA: Insufficient documentation

## 2011-04-27 DIAGNOSIS — R509 Fever, unspecified: Secondary | ICD-10-CM | POA: Insufficient documentation

## 2011-04-27 DIAGNOSIS — Z79899 Other long term (current) drug therapy: Secondary | ICD-10-CM | POA: Insufficient documentation

## 2011-04-27 DIAGNOSIS — R05 Cough: Secondary | ICD-10-CM | POA: Insufficient documentation

## 2011-04-27 DIAGNOSIS — I10 Essential (primary) hypertension: Secondary | ICD-10-CM | POA: Insufficient documentation

## 2011-04-27 DIAGNOSIS — I4891 Unspecified atrial fibrillation: Secondary | ICD-10-CM | POA: Insufficient documentation

## 2011-04-27 DIAGNOSIS — J4489 Other specified chronic obstructive pulmonary disease: Secondary | ICD-10-CM | POA: Insufficient documentation

## 2011-04-27 DIAGNOSIS — M129 Arthropathy, unspecified: Secondary | ICD-10-CM | POA: Insufficient documentation

## 2011-04-27 DIAGNOSIS — F1020 Alcohol dependence, uncomplicated: Secondary | ICD-10-CM

## 2011-04-27 DIAGNOSIS — J449 Chronic obstructive pulmonary disease, unspecified: Secondary | ICD-10-CM | POA: Insufficient documentation

## 2011-04-27 LAB — BASIC METABOLIC PANEL
BUN: 13 mg/dL (ref 6–23)
CO2: 26 mEq/L (ref 19–32)
Calcium: 9.3 mg/dL (ref 8.4–10.5)
GFR calc non Af Amer: 60 mL/min — ABNORMAL LOW (ref >60)
Glucose, Bld: 97 mg/dL (ref 70–99)
Sodium: 128 mEq/L — ABNORMAL LOW (ref 135–145)

## 2011-04-28 ENCOUNTER — Inpatient Hospital Stay (HOSPITAL_COMMUNITY): Payer: Medicare Other

## 2011-04-28 DIAGNOSIS — E871 Hypo-osmolality and hyponatremia: Secondary | ICD-10-CM | POA: Insufficient documentation

## 2011-04-28 DIAGNOSIS — M5030 Other cervical disc degeneration, unspecified cervical region: Secondary | ICD-10-CM | POA: Insufficient documentation

## 2011-04-28 DIAGNOSIS — F1010 Alcohol abuse, uncomplicated: Secondary | ICD-10-CM | POA: Insufficient documentation

## 2011-04-28 DIAGNOSIS — I10 Essential (primary) hypertension: Secondary | ICD-10-CM | POA: Insufficient documentation

## 2011-04-28 DIAGNOSIS — S0990XA Unspecified injury of head, initial encounter: Secondary | ICD-10-CM | POA: Insufficient documentation

## 2011-04-28 DIAGNOSIS — R42 Dizziness and giddiness: Secondary | ICD-10-CM | POA: Insufficient documentation

## 2011-04-28 DIAGNOSIS — Z8619 Personal history of other infectious and parasitic diseases: Secondary | ICD-10-CM | POA: Insufficient documentation

## 2011-04-28 DIAGNOSIS — W1809XA Striking against other object with subsequent fall, initial encounter: Secondary | ICD-10-CM | POA: Insufficient documentation

## 2011-04-28 DIAGNOSIS — G319 Degenerative disease of nervous system, unspecified: Secondary | ICD-10-CM | POA: Insufficient documentation

## 2011-04-28 DIAGNOSIS — R51 Headache: Secondary | ICD-10-CM | POA: Insufficient documentation

## 2011-04-28 LAB — URINALYSIS, ROUTINE W REFLEX MICROSCOPIC
Glucose, UA: NEGATIVE mg/dL
Hgb urine dipstick: NEGATIVE
Ketones, ur: NEGATIVE mg/dL
Protein, ur: NEGATIVE mg/dL
pH: 6 (ref 5.0–8.0)

## 2011-04-28 NOTE — H&P (Signed)
Zachary [name], Zachary [name]             ACCOUNT NO.:  [id number]  MEDICAL RECORD NO.:  [id number]           PATIENT TYPE:  I  LOCATION:  0305                          [facility]  [facility]  PHYSICIAN:  Franchot Gallo, MD     DATE OF BIRTH:  01-28-1941  DATE OF ADMISSION:  04/26/2011 DATE OF DISCHARGE:                      PSYCHIATRIC ADMISSION ASSESSMENT   This is a 70 year old male who was voluntarily admitted on Apr 26, 2011.  HISTORY OF PRESENT ILLNESS:  The patient presented to the emergency department after a fall due to intoxication.  It stated the patient fell into  a wall and hit his head.  He has been drinking daily,  drinking up to 4 40-ounce daily.  He reports he drinks all day long.  He has lost his appetite with weight loss of at least 10 pounds.  He has had trouble sleeping.  He denies any suicidal thoughts.  He denies any other recreational drug use.  PAST PSYCHIATRIC HISTORY:  First admission to [facility]. Has had a history of being detoxed approximately 14 years ago.  No current outpatient mental health therapy.  SOCIAL HISTORY:  The patient lives with two friends.  He receives a Radio producer.  He denies any legal charges.  FAMILY HISTORY:  None.  DRUG HISTORY:  The patient reports drinking as early as 70 years of age, experiencing blackouts.  Denies any seizure activity.  He has had an 46- year history of sobriety,  relapsed about 3 years ago.  PAST MEDICAL HISTORY:  Medical problems: Coronary artery disease, gout and hypertension.  MEDICATIONS:  Benicar 40 mg daily, Celebrex 200 mg daily, colchicine 0.6 mg b.i.d. Imdur  30 mg daily, metoprolol 25 mg b.i.d. potassium chloride 10 mg daily, Plavix 10 mg daily and Dexilant  DR 60 mg daily.  DRUG ALLERGIES:  OxyContin  PHYSICAL EXAMINATION:  Noted to have superficial healing laceration left medial eyebrow. No other significant findings.  The patient had a CAT scan of the head that shows no acute  intracranial abnormality, progressive atrophy, possible scalp contusion of the right orbit. Also had a CAT scan of the spine that showed no acute abnormality of the cervical spine,  degenerative disk and joint disease, stable.  His CBC shows a hemoglobin 11.4, hematocrit of 33.5.  Platelet count is within normal limits.  Urinalysis is negative.  Urine drug screen is negative.  Alcohol level less than less than 11. Sodium of 122, potassium 4.8. albumin 2.9.  MENTAL STATUS EXAM:  Patient is awake and cooperative.  He walks slowly. For the interview,  he is dressed in his own clothing,  somewhat unkempt.  His speech is soft spoken but clear.  His mood is neutral.  He denies any depression or suicidal thoughts, wanting to get help, asking appropriate questions.  Denies any suicidal or homicidal thoughts or hallucinations.  IMPRESSION:  AXIS I:  Alcohol dependence. AXIS II:  None. AXIS III:  History of asthma, hypertension, gout and coronary artery disease. AXIS IV:  Medical problems, other psychosocial problems were the chronic substance use. AXIS V:  30.  PLAN:  Put patient on the Librium protocol.  Will encourage fluids.  We will recheck his labs.  Have trazodone available for sleep.  Assess his motivation for rehab programs and identify of comorbidities.  His tentative length of stay at this time is 4-5 days.     Landry Corporal, N.P.   ______________________________ Franchot Gallo, MD    JO/MEDQ  D:  04/27/2011  T:  04/28/2011  Job:  [id number]  Electronically Signed by Limmie PatriciaP. on 04/28/2011 02:18:51 PM Electronically Signed by Franchot Gallo MD on 04/28/2011 05:03:51 PM

## 2011-05-01 ENCOUNTER — Ambulatory Visit (HOSPITAL_COMMUNITY)
Admission: EM | Admit: 2011-05-01 | Discharge: 2011-05-02 | Disposition: A | Discharge status: Home / Self Care | Payer: 59 | Source: Ambulatory Visit | Attending: Emergency Medicine | Admitting: Emergency Medicine

## 2011-05-01 DIAGNOSIS — K219 Gastro-esophageal reflux disease without esophagitis: Secondary | ICD-10-CM | POA: Insufficient documentation

## 2011-05-01 DIAGNOSIS — R0989 Other specified symptoms and signs involving the circulatory and respiratory systems: Secondary | ICD-10-CM | POA: Insufficient documentation

## 2011-05-01 DIAGNOSIS — R079 Chest pain, unspecified: Secondary | ICD-10-CM | POA: Insufficient documentation

## 2011-05-01 DIAGNOSIS — F1020 Alcohol dependence, uncomplicated: Secondary | ICD-10-CM | POA: Insufficient documentation

## 2011-05-01 DIAGNOSIS — R0609 Other forms of dyspnea: Secondary | ICD-10-CM | POA: Insufficient documentation

## 2011-05-01 DIAGNOSIS — R110 Nausea: Secondary | ICD-10-CM | POA: Insufficient documentation

## 2011-05-01 LAB — COMPREHENSIVE METABOLIC PANEL
AST: 30 U/L (ref 0–37)
Albumin: 2.6 g/dL — ABNORMAL LOW (ref 3.5–5.2)
Alkaline Phosphatase: 86 U/L (ref 39–117)
BUN: 14 mg/dL (ref 6–23)
CO2: 28 mEq/L (ref 19–32)
Chloride: 100 mEq/L (ref 96–112)
Creatinine, Ser: 1.3 mg/dL (ref 0.4–1.5)
GFR calc Af Amer: >60 mL/min (ref >60)
GFR calc non Af Amer: 55 mL/min — ABNORMAL LOW (ref >60)
Potassium: 4.5 mEq/L (ref 3.5–5.1)
Total Bilirubin: 0.3 mg/dL (ref 0.3–1.2)

## 2011-05-02 LAB — CK TOTAL AND CKMB (NOT AT ARMC)
CK, MB: 0.6 ng/mL (ref 0.3–4.0)
Relative Index: INVALID (ref 0.0–2.5)
Total CK: 27 U/L (ref 7–232)
Total CK: 26 U/L (ref 7–232)

## 2011-05-02 LAB — POCT I-STAT, CHEM 8
BUN: 15 mg/dL (ref 6–23)
Chloride: 100 mEq/L (ref 96–112)
Creatinine, Ser: 1.5 mg/dL (ref 0.4–1.5)
Hemoglobin: 10.5 g/dL — ABNORMAL LOW (ref 13.0–17.0)
Potassium: 4.3 mEq/L (ref 3.5–5.1)
Sodium: 136 mEq/L (ref 135–145)

## 2011-05-02 LAB — CBC
MCHC: 32.8 g/dL (ref 30.0–36.0)
Platelets: 230 10*3/uL (ref 150–400)
RDW: 17.5 % — ABNORMAL HIGH (ref 11.5–15.5)
WBC: 6.9 10*3/uL (ref 4.0–10.5)

## 2011-05-02 LAB — CARDIAC PANEL(CRET KIN+CKTOT+MB+TROPI)
CK, MB: 0.9 ng/mL (ref 0.3–4.0)
Relative Index: INVALID (ref 0.0–2.5)
Total CK: 26 U/L (ref 7–232)

## 2011-05-02 LAB — DIFFERENTIAL
Basophils Absolute: 0.1 10*3/uL (ref 0.0–0.1)
Basophils Relative: 1 % (ref 0–1)
Eosinophils Absolute: 0.2 10*3/uL (ref 0.0–0.7)
Eosinophils Relative: 3 % (ref 0–5)
Monocytes Absolute: 1 10*3/uL (ref 0.1–1.0)

## 2011-05-03 LAB — BASIC METABOLIC PANEL
Calcium: 9 mg/dL (ref 8.4–10.5)
Chloride: 97 mEq/L (ref 96–112)
Creatinine, Ser: 1.24 mg/dL (ref 0.4–1.5)
GFR calc Af Amer: >60 mL/min (ref >60)
Sodium: 132 mEq/L — ABNORMAL LOW (ref 135–145)

## 2011-05-03 LAB — CBC
MCH: 25.5 pg — ABNORMAL LOW (ref 26.0–34.0)
Platelets: 296 10*3/uL (ref 150–400)
RBC: 3.88 MIL/uL — ABNORMAL LOW (ref 4.22–5.81)

## 2011-05-04 LAB — CBC
MCV: 80.8 fL (ref 78.0–100.0)
Platelets: 334 10*3/uL (ref 150–400)
RBC: 4.01 MIL/uL — ABNORMAL LOW (ref 4.22–5.81)
WBC: 10 10*3/uL (ref 4.0–10.5)

## 2011-05-04 LAB — IRON AND TIBC
Iron: 21 ug/dL — ABNORMAL LOW (ref 42–135)
Saturation Ratios: 6 % — ABNORMAL LOW (ref 20–55)
UIBC: 343 ug/dL

## 2011-05-04 NOTE — Consult Note (Signed)
Zachary [name], Zachary [name]             ACCOUNT NO.:  [id number]  MEDICAL RECORD NO.:  [id number]           PATIENT TYPE:  I  LOCATION:  0305                          FACILITY:  [facility]  PHYSICIAN:  Mauro Kaufmann, MD         DATE OF BIRTH:  1941-03-04  DATE OF CONSULTATION:  05/02/2011 DATE OF DISCHARGE:                                CONSULTATION   REQUESTING PHYSICIAN:  Franchot Gallo, MD  CHIEF COMPLAINT:  Anemia.  HISTORY OF PRESENT ILLNESS:  This is a 70 year old male who is admitted to the [facility] for alcohol dependence.  The patient has a history of asthma, hypertension, gout, and CAD.  The patient had a chest pain yet last night for which he was seen in the ER and he had negative cardiac enzymes.  He was sent back to the [facility].  The patient's hemoglobin had a drop from 11.4-9.7, so medical consultation was obtained for the anemia.  The patient at this time denies any symptoms.  No chest pain or shortness of breath.  No headache, noblurred vision.  No runny nose or sore throat.  The patient denies any fever.  No passing out spell.  No known dizziness.  PAST MEDICAL HISTORY:  Significant for; 1. Hypertension. 2. Gout. 3. Asthma. 4. CAD.  MEDICATIONS: 1. Benicar 40 mg daily. 2. Celebrex 200 mg p.o. daily. 3. Colchicine 0.6 mg p.o. b.i.d. 4. Imdur 30 mg p.o. daily. 5. Metoprolol 25 mg p.o. 6. Potassium chloride 10 mg daily. 7. Plavix 75 mg p.o. daily. 8. Dexilant 60 mg p.o. daily.  REVIEW OF SYSTEMS:  As above.  PHYSICAL EXAMINATION:  VITAL SIGNS:  The patient's blood pressure is 95/63, temperature 98, respirations 16, pulse 62 HEENT:  Atraumatic and normocephalic.  Eyes, extraocular muscles intact. Oral mucosa is pink and moist. NECK:  Supple. CHEST:  Clear to auscultation bilaterally. HEART:  S1 and S2.  Regular rate and rhythm. ABDOMEN:  Normal, soft, nontender.  No organomegaly. EXTREMITIES:  No cyanosis, clubbing, or edema.  No focal  deficit at this time.  PERTINENT LABS:  As of today, WBC is 66.9, hemoglobin 9.7, hematocrit 29.6, white count of 230, sodium 134, potassium 4.5, chloride is 100, CO2 of 28, BUN 14, creatinine 1.30, and glucose is 90.  ASSESSMENT AND RECOMMENDATIONS: 1. Anemia.  The patient hemoglobin has dropped on point 11.4 to 9.7.     I would recommend getting stool for occult blood to check for blood     in the stools as the patient has been on Celebrex for long time for     the gout.  Also repeat a CBC in the morning. 2. Coronary artery disease.  The patient has mild-to-moderate CAD as     per the cardiac cath done in January of this year by Dr. Algie Coffer,     so I will continue the patient on medical management at this time. 3. Hypertension.  The patient's blood pressure is on the lower side     which could be due to be secondary he is taking metoprolol as well     as Imdur,  I would continue these medications as the patient is     taking these for CAD. 4. Asthma.  The patient was started on DuoNeb on nebulizer treatments     for the asthma. 5. Gout.  The patient can continue on colchicine.  I would discontinue     the Celebrex at this time due to the concern for anemia.  If the patient's occult blood is negative and his hemoglobin remains stable, he can follow up with his primary care provider as an outpatient and I would recommend not putting him back on Celebrex at this time.     Mauro Kaufmann, MD     GL/MEDQ  D:  05/02/2011  T:  05/02/2011  Job:  [id number]  Electronically Signed by Mauro Kaufmann  on 05/04/2011 07:10:17 AM

## 2011-05-05 DIAGNOSIS — F1020 Alcohol dependence, uncomplicated: Secondary | ICD-10-CM

## 2011-05-05 LAB — TESTOSTERONE: Testosterone: 289.83 ng/dL (ref 250–890)

## 2011-05-05 LAB — TESTOSTERONE, FREE: Testosterone, Free: 46.6 pg/mL — ABNORMAL LOW (ref 47.0–244.0)

## 2011-05-05 LAB — SEX HORMONE BINDING GLOBULIN: Sex Hormone Binding: 45 nmol/L (ref 13–71)

## 2011-05-06 LAB — URINALYSIS, ROUTINE W REFLEX MICROSCOPIC
Bilirubin Urine: NEGATIVE
Hgb urine dipstick: NEGATIVE
Ketones, ur: NEGATIVE mg/dL
Nitrite: NEGATIVE
Urobilinogen, UA: 0.2 mg/dL (ref 0.0–1.0)
pH: 6 (ref 5.0–8.0)

## 2011-05-06 LAB — CBC
HCT: 30 % — ABNORMAL LOW (ref 39.0–52.0)
MCH: 25.1 pg — ABNORMAL LOW (ref 26.0–34.0)
MCV: 81.1 fL (ref 78.0–100.0)
Platelets: 375 10*3/uL (ref 150–400)
RDW: 17.6 % — ABNORMAL HIGH (ref 11.5–15.5)

## 2011-05-08 LAB — STOOL CULTURE

## 2011-05-12 NOTE — Discharge Summary (Signed)
Zachary [name], KICHLINE NO.:  [id number]  MEDICAL RECORD NO.:  [id number]  LOCATION:                                FACILITY:  [facility]  PHYSICIAN:  Franchot Gallo, MD     DATE OF BIRTH:  01-31-1941  DATE OF ADMISSION:  04/26/2011 DATE OF DISCHARGE:  05/09/2011                              DISCHARGE SUMMARY   REASON FOR ADMISSION:  This was a 70 year old male that we admitted after a fall from intoxication.  Apparently the patient had hit his head and was drinking daily up to 4 40 ounces daily, reporting drinking all day long and had an approximate weight loss of 10 pounds.  He denied any suicidal thoughts or other recreational drug use.  FINAL IMPRESSION:  AXIS I:  Alcohol dependence. AXIS II:  None. AXIS III:  History of asthma, hypertension, gout and coronary artery disease. AXIS IV:  Medical problems, psychosocial problems, chronic substance use. AXIS V:  50.  PERTINENT LABS:  CAT scan of the head showed no acute intracranial abnormality, progressive atrophy and with a possible scalp contusion of the right orbit. Hemoglobin 11.4, hematocrit of 33.5.  Platelet counts was within normal limits.  Urine drug screen is negative.  Alcohol level less than 11.  The patient was hyponatremic with a sodium of 122, potassium 4.8 and albumin of 2.9.  SIGNIFICANT FINDINGS:  The patient is a unkempt but oriented male, walking very slowly, appears very frail and has poor eye contact.  He was placed in a substance abuse program and started on a detox protocol. He had trazodone for sleep.  The following day the patient had to go to the emergency department with a fever of 101.  No significant findings were noted.  He denied any acute withdrawal symptoms.  Repeat labs showed a sodium of 128 and a potassium of 4.5.  The patient was having a lot issues with his feet and stiffness pain and neuropathy, but denied any withdrawal symptoms.  He denied any pressure or  suicidal thoughts.  His appetite was improving but still appeared very frail.  We added Celebrex to control his pain.  The patient was considered a fall risk.  The patient was placed on a one-to-one due to unsteadiness we are monitoring his ADLs and he was agreeable to further rehab at a SNF unit. We had Internal Medicine come in for the patient's low blood pressures and dizziness and low H&H. He was very sleepy with poor eye contact.  He denied any depression but was offering very little about his plans.  We started Cymbalta for neuropathic pain and Neurontin for pain as well. Physical therapy was coming to evaluate him for either SNF or home health. He remained on a one-to-one and again was continued to be followed by internal medicine.  He appeared to be flat, depressed and had a blunted affect, was slow to speak and did not may make eye contact.  We continued to offer him fluids and nutritional supplements. We then had a nutrition assessment for his low albumin and his poor appetite.  He was endorsing good sleep and was trying to work on drinking more, denied any suicidal thoughts.  We did a testosterone level, which was low, and started the patient on AndroGel.  The patient was making little effort into caring for himself and appeared disheveled with poor eye contact looking d weak, remaining in bed.  He was improving slightly and we are working on his conditioning.  We encouraged the patient to be out of bed, attend groups and continue with the one-to-one We clarified his living situation.  His friend had made contact and stated that she would like to see the patient and felt that she could handle taking him home.  We had contact with Ava, the Child psychotherapist. His friend stated that the patient was doing much better than when he came in, his mood was good and his color was better.  On day of discharge the patient was requesting discharge to the home of a friend. He opted to go home  instead of going to a skilled nursing facility.  Home health will continue to monitor him at home.  His sleep was good.  His appetite was improving, having mild depressive symptoms, tolerating his medications.  There are no signs of any alcohol withdrawal.  DISCHARGE MEDICATIONS: 1. Symbicort 2 puffs b.i.d. 2. Cymbalta 30 mg daily. 3. Gabapentin 300 mg 1 t.i.d. 4. Iron daily. 5. Testosterone 5 grams daily. 6. Thiamine 100 mg daily. 7. Enteric-coated aspirin 81 mg daily. 8. Colchicine 0.6 mg b.i.d. 10.Imdur XR 30 mg daily. 11.Lopressor 25 mg 1 b.i.d. 12.Plavix 75 mg daily. 13.Potassium chloride 10 mEq daily. 14.The patient is to stop taking his Celebrex  DISCHARGE INSTRUCTIONS:  The patient was instructed to stop drinking. He was provided a list of AA meetings.  He had an appointment with Dr. Everlene Other  at [facility] at phone number [phone]  on May 12, 2011 at 3:15.     Landry Corporal, N.P.   ______________________________ Franchot Gallo, MD    JO/MEDQ  D:  05/12/2011  T:  05/12/2011  Job:  [id number]  Electronically Signed by Limmie PatriciaP. on 05/12/2011 03:17:08 PM Electronically Signed by Franchot Gallo MD on 05/12/2011 06:12:40 PM

## 2011-08-29 LAB — CBC
Hemoglobin: 15.4
RBC: 4.55
WBC: 13.6 — ABNORMAL HIGH

## 2011-08-29 LAB — POCT I-STAT, CHEM 8
Creatinine, Ser: 1.3
HCT: 50
Hemoglobin: 17
Potassium: 3.6
Sodium: 139
TCO2: 26

## 2011-08-29 LAB — PROTIME-INR
INR: 0.9
Prothrombin Time: 12.8

## 2011-08-29 LAB — TYPE AND SCREEN: Antibody Screen: NEGATIVE

## 2011-08-29 LAB — ABO/RH: ABO/RH(D): O POS

## 2011-09-03 ENCOUNTER — Emergency Department (HOSPITAL_COMMUNITY): Payer: Medicare Other

## 2011-09-03 ENCOUNTER — Emergency Department (HOSPITAL_COMMUNITY)
Admission: EM | Admit: 2011-09-03 | Discharge: 2011-09-04 | Disposition: A | Discharge status: Home / Self Care | Payer: Medicare Other | Attending: Emergency Medicine | Admitting: Emergency Medicine

## 2011-09-03 DIAGNOSIS — I10 Essential (primary) hypertension: Secondary | ICD-10-CM | POA: Insufficient documentation

## 2011-09-03 DIAGNOSIS — M2550 Pain in unspecified joint: Secondary | ICD-10-CM | POA: Insufficient documentation

## 2011-09-03 DIAGNOSIS — M129 Arthropathy, unspecified: Secondary | ICD-10-CM | POA: Insufficient documentation

## 2011-09-03 DIAGNOSIS — R0602 Shortness of breath: Secondary | ICD-10-CM | POA: Insufficient documentation

## 2011-09-03 DIAGNOSIS — R05 Cough: Secondary | ICD-10-CM | POA: Insufficient documentation

## 2011-09-03 DIAGNOSIS — Z8619 Personal history of other infectious and parasitic diseases: Secondary | ICD-10-CM | POA: Insufficient documentation

## 2011-09-03 DIAGNOSIS — Z862 Personal history of diseases of the blood and blood-forming organs and certain disorders involving the immune mechanism: Secondary | ICD-10-CM | POA: Insufficient documentation

## 2011-09-03 DIAGNOSIS — J3489 Other specified disorders of nose and nasal sinuses: Secondary | ICD-10-CM | POA: Insufficient documentation

## 2011-09-03 DIAGNOSIS — R509 Fever, unspecified: Secondary | ICD-10-CM | POA: Insufficient documentation

## 2011-09-03 DIAGNOSIS — J4489 Other specified chronic obstructive pulmonary disease: Secondary | ICD-10-CM | POA: Insufficient documentation

## 2011-09-03 DIAGNOSIS — D72829 Elevated white blood cell count, unspecified: Secondary | ICD-10-CM | POA: Insufficient documentation

## 2011-09-03 DIAGNOSIS — Z8639 Personal history of other endocrine, nutritional and metabolic disease: Secondary | ICD-10-CM | POA: Insufficient documentation

## 2011-09-03 DIAGNOSIS — R059 Cough, unspecified: Secondary | ICD-10-CM | POA: Insufficient documentation

## 2011-09-03 DIAGNOSIS — J449 Chronic obstructive pulmonary disease, unspecified: Secondary | ICD-10-CM | POA: Insufficient documentation

## 2011-09-03 LAB — DIFFERENTIAL
Basophils Absolute: 0 10*3/uL (ref 0.0–0.1)
Eosinophils Relative: 1 % (ref 0–5)
Lymphocytes Relative: 14 % (ref 12–46)
Lymphs Abs: 2.3 10*3/uL (ref 0.7–4.0)
Neutro Abs: 13 10*3/uL — ABNORMAL HIGH (ref 1.7–7.7)
Neutrophils Relative %: 77 % (ref 43–77)

## 2011-09-03 LAB — CBC
HCT: 41.6 % (ref 39.0–52.0)
MCV: 92.4 fL (ref 78.0–100.0)
RBC: 4.5 MIL/uL (ref 4.22–5.81)
RDW: 15.8 % — ABNORMAL HIGH (ref 11.5–15.5)
WBC: 16.9 10*3/uL — ABNORMAL HIGH (ref 4.0–10.5)

## 2011-09-03 LAB — COMPREHENSIVE METABOLIC PANEL
AST: 21 U/L (ref 0–37)
Albumin: 3.3 g/dL — ABNORMAL LOW (ref 3.5–5.2)
Alkaline Phosphatase: 92 U/L (ref 39–117)
BUN: 11 mg/dL (ref 6–23)
CO2: 30 mEq/L (ref 19–32)
Chloride: 98 mEq/L (ref 96–112)
GFR calc Af Amer: 53 mL/min — ABNORMAL LOW (ref >60)
Potassium: 4 mEq/L (ref 3.5–5.1)
Total Bilirubin: 0.3 mg/dL (ref 0.3–1.2)

## 2011-09-06 LAB — DIFFERENTIAL
Eosinophils Relative: 2
Lymphocytes Relative: 41
Lymphs Abs: 4.3 — ABNORMAL HIGH
Monocytes Absolute: 0.9
Monocytes Relative: 8
Neutro Abs: 4.7

## 2011-09-06 LAB — RAPID URINE DRUG SCREEN, HOSP PERFORMED
Amphetamines: NOT DETECTED
Barbiturates: NOT DETECTED
Benzodiazepines: NOT DETECTED
Cocaine: POSITIVE — AB
Opiates: NOT DETECTED
Tetrahydrocannabinol: NOT DETECTED

## 2011-09-06 LAB — OCCULT BLOOD X 1 CARD TO LAB, STOOL: Fecal Occult Bld: NEGATIVE

## 2011-09-06 LAB — BASIC METABOLIC PANEL
BUN: 14
BUN: 19
CO2: 26
Chloride: 99
GFR calc Af Amer: >60
GFR calc non Af Amer: >60
GFR calc non Af Amer: >60
GFR calc non Af Amer: >60
Glucose, Bld: 118 — ABNORMAL HIGH
Glucose, Bld: 277 — ABNORMAL HIGH
Potassium: 4
Potassium: 4.1
Potassium: 4.3
Sodium: 136
Sodium: 140

## 2011-09-06 LAB — COMPREHENSIVE METABOLIC PANEL
ALT: 33
Albumin: 2.5 — ABNORMAL LOW
Alkaline Phosphatase: 93
BUN: 7
Chloride: 104
Glucose, Bld: 151 — ABNORMAL HIGH
Potassium: 3.7
Sodium: 136
Total Bilirubin: 0.4

## 2011-09-06 LAB — CARDIAC PANEL(CRET KIN+CKTOT+MB+TROPI)
Relative Index: INVALID
Total CK: 54
Troponin I: 0.02

## 2011-09-06 LAB — CK TOTAL AND CKMB (NOT AT ARMC)
CK, MB: 2.4
Relative Index: INVALID
Total CK: 56

## 2011-09-06 LAB — CBC
HCT: 35.3 — ABNORMAL LOW
HCT: 44.4
Hemoglobin: 12.1 — ABNORMAL LOW
Hemoglobin: 14.8
RBC: 4.44
RDW: 15
WBC: 10.4
WBC: 9.5

## 2011-09-06 LAB — PROTIME-INR
INR: 1
Prothrombin Time: 13.2

## 2011-09-06 LAB — HEPATIC FUNCTION PANEL
ALT: 40
Alkaline Phosphatase: 111
Indirect Bilirubin: 0.3
Total Protein: 7.6

## 2011-09-06 LAB — RHEUMATOID FACTOR: Rhuematoid fact SerPl-aCnc: <20

## 2011-09-06 LAB — HEPATITIS PANEL, ACUTE
HCV Ab: NEGATIVE
Hep A IgM: NEGATIVE

## 2011-09-06 LAB — B-NATRIURETIC PEPTIDE (CONVERTED LAB): Pro B Natriuretic peptide (BNP): 79

## 2011-09-06 LAB — SEDIMENTATION RATE: Sed Rate: 59 — ABNORMAL HIGH

## 2011-09-06 LAB — HEMOGLOBIN A1C: Hgb A1c MFr Bld: 5.7

## 2011-09-06 LAB — VITAMIN B12: Vitamin B-12: 807 (ref 211–911)

## 2011-09-06 LAB — TROPONIN I: Troponin I: 0.04

## 2011-09-06 LAB — APTT: aPTT: 30

## 2011-09-06 LAB — MAGNESIUM: Magnesium: 1.9

## 2011-09-06 LAB — POCT CARDIAC MARKERS: Troponin i, poc: <0.05

## 2011-09-21 LAB — COMPREHENSIVE METABOLIC PANEL
Albumin: 2.9 — ABNORMAL LOW
Alkaline Phosphatase: 70
BUN: 8
CO2: 25
Chloride: 105
Creatinine, Ser: 1.22
GFR calc non Af Amer: 60 — ABNORMAL LOW
Glucose, Bld: 117 — ABNORMAL HIGH
Potassium: 3.4 — ABNORMAL LOW
Total Bilirubin: 0.3

## 2011-09-21 LAB — PROTIME-INR: INR: 1

## 2011-09-21 LAB — APTT: aPTT: 29

## 2011-09-21 LAB — CBC
HCT: 45.6
Hemoglobin: 15.4
MCV: 100.9 — ABNORMAL HIGH
Platelets: 237
RBC: 4.51
WBC: 9.6

## 2011-09-21 LAB — DIFFERENTIAL
Basophils Absolute: 0
Basophils Relative: 1
Lymphocytes Relative: 37
Monocytes Absolute: 0.8 — ABNORMAL HIGH
Neutro Abs: 5.1

## 2011-09-21 LAB — URINALYSIS, ROUTINE W REFLEX MICROSCOPIC
Bilirubin Urine: NEGATIVE
Glucose, UA: NEGATIVE
Hgb urine dipstick: NEGATIVE
Specific Gravity, Urine: 1.007
pH: 6

## 2011-09-21 LAB — LIPASE, BLOOD: Lipase: 53

## 2011-09-21 LAB — POCT CARDIAC MARKERS: CKMB, poc: <1 — ABNORMAL LOW

## 2011-10-12 ENCOUNTER — Emergency Department (HOSPITAL_COMMUNITY): Payer: 59

## 2011-10-12 ENCOUNTER — Emergency Department (HOSPITAL_COMMUNITY)
Admission: EM | Admit: 2011-10-12 | Discharge: 2011-10-13 | Disposition: A | Discharge status: Other Acute Inpatient Hospital | Payer: 59 | Source: Home / Self Care | Attending: Emergency Medicine | Admitting: Emergency Medicine

## 2011-10-12 ENCOUNTER — Encounter: Payer: Self-pay | Admitting: Emergency Medicine

## 2011-10-12 DIAGNOSIS — F1010 Alcohol abuse, uncomplicated: Secondary | ICD-10-CM | POA: Insufficient documentation

## 2011-10-12 DIAGNOSIS — R05 Cough: Secondary | ICD-10-CM | POA: Insufficient documentation

## 2011-10-12 DIAGNOSIS — I2510 Atherosclerotic heart disease of native coronary artery without angina pectoris: Secondary | ICD-10-CM | POA: Insufficient documentation

## 2011-10-12 DIAGNOSIS — I10 Essential (primary) hypertension: Secondary | ICD-10-CM | POA: Insufficient documentation

## 2011-10-12 DIAGNOSIS — R079 Chest pain, unspecified: Secondary | ICD-10-CM | POA: Insufficient documentation

## 2011-10-12 DIAGNOSIS — R059 Cough, unspecified: Secondary | ICD-10-CM | POA: Insufficient documentation

## 2011-10-12 DIAGNOSIS — F3289 Other specified depressive episodes: Secondary | ICD-10-CM | POA: Insufficient documentation

## 2011-10-12 DIAGNOSIS — F329 Major depressive disorder, single episode, unspecified: Secondary | ICD-10-CM

## 2011-10-12 DIAGNOSIS — J45909 Unspecified asthma, uncomplicated: Secondary | ICD-10-CM | POA: Insufficient documentation

## 2011-10-12 DIAGNOSIS — Z79899 Other long term (current) drug therapy: Secondary | ICD-10-CM | POA: Insufficient documentation

## 2011-10-12 DIAGNOSIS — E785 Hyperlipidemia, unspecified: Secondary | ICD-10-CM | POA: Insufficient documentation

## 2011-10-12 HISTORY — DX: Essential (primary) hypertension: I10

## 2011-10-12 HISTORY — DX: Atherosclerotic heart disease of native coronary artery without angina pectoris: I25.10

## 2011-10-12 HISTORY — DX: Hyperlipidemia, unspecified: E78.5

## 2011-10-12 LAB — ETHANOL: Alcohol, Ethyl (B): 252 mg/dL — ABNORMAL HIGH (ref 0–11)

## 2011-10-12 LAB — CBC
MCV: 92.1 fL (ref 78.0–100.0)
Platelets: 250 10*3/uL (ref 150–400)
RBC: 4.32 MIL/uL (ref 4.22–5.81)
RDW: 15 % (ref 11.5–15.5)
WBC: 9.3 10*3/uL (ref 4.0–10.5)

## 2011-10-12 LAB — COMPREHENSIVE METABOLIC PANEL
ALT: 25 U/L (ref 0–53)
AST: 39 U/L — ABNORMAL HIGH (ref 0–37)
Alkaline Phosphatase: 99 U/L (ref 39–117)
CO2: 29 mEq/L (ref 19–32)
Chloride: 95 mEq/L — ABNORMAL LOW (ref 96–112)
GFR calc Af Amer: >90 mL/min (ref >90)
GFR calc non Af Amer: 88 mL/min — ABNORMAL LOW (ref >90)
Glucose, Bld: 90 mg/dL (ref 70–99)
Potassium: 3.9 mEq/L (ref 3.5–5.1)
Sodium: 134 mEq/L — ABNORMAL LOW (ref 135–145)

## 2011-10-12 LAB — PROTIME-INR
INR: 0.99 (ref 0.00–1.49)
Prothrombin Time: 13.3 seconds (ref 11.6–15.2)

## 2011-10-12 LAB — URINALYSIS, ROUTINE W REFLEX MICROSCOPIC
Bilirubin Urine: NEGATIVE
Glucose, UA: NEGATIVE mg/dL
Ketones, ur: NEGATIVE mg/dL
Leukocytes, UA: NEGATIVE
Protein, ur: NEGATIVE mg/dL
pH: 5.5 (ref 5.0–8.0)

## 2011-10-12 LAB — RAPID URINE DRUG SCREEN, HOSP PERFORMED
Amphetamines: NOT DETECTED
Barbiturates: NOT DETECTED
Benzodiazepines: NOT DETECTED
Cocaine: NOT DETECTED

## 2011-10-12 LAB — DIFFERENTIAL
Basophils Absolute: 0 10*3/uL (ref 0.0–0.1)
Lymphocytes Relative: 45 % (ref 12–46)
Lymphs Abs: 4.1 10*3/uL — ABNORMAL HIGH (ref 0.7–4.0)
Neutro Abs: 4.6 10*3/uL (ref 1.7–7.7)
Neutrophils Relative %: 50 % (ref 43–77)

## 2011-10-12 MED ORDER — MONTELUKAST SODIUM 10 MG PO TABS
10.0000 mg | ORAL_TABLET | Freq: Every day | ORAL | Status: DC
  Administered 2011-10-13: 10 mg via ORAL
  Filled 2011-10-12: qty 1

## 2011-10-12 MED ORDER — NICOTINE 21 MG/24HR TD PT24
21.0000 mg | MEDICATED_PATCH | Freq: Every day | TRANSDERMAL | Status: DC
  Administered 2011-10-13: 21 mg via TRANSDERMAL
  Filled 2011-10-12 (×2): qty 1

## 2011-10-12 MED ORDER — COLCHICINE 0.6 MG PO TABS
1.2000 mg | ORAL_TABLET | Freq: Every day | ORAL | Status: DC
  Administered 2011-10-13: 1.2 mg via ORAL
  Filled 2011-10-12 (×2): qty 2

## 2011-10-12 MED ORDER — CELECOXIB 200 MG PO CAPS
200.0000 mg | ORAL_CAPSULE | Freq: Two times a day (BID) | ORAL | Status: DC
  Administered 2011-10-13 (×2): 200 mg via ORAL
  Filled 2011-10-12 (×2): qty 1

## 2011-10-12 MED ORDER — METOPROLOL TARTRATE 25 MG PO TABS
25.0000 mg | ORAL_TABLET | Freq: Two times a day (BID) | ORAL | Status: DC
  Administered 2011-10-13 (×2): 25 mg via ORAL
  Filled 2011-10-12 (×2): qty 1

## 2011-10-12 MED ORDER — ALLOPURINOL 300 MG PO TABS
300.0000 mg | ORAL_TABLET | Freq: Two times a day (BID) | ORAL | Status: DC
  Administered 2011-10-13 (×2): 300 mg via ORAL
  Filled 2011-10-12 (×2): qty 1

## 2011-10-12 MED ORDER — SODIUM CHLORIDE 0.9 % IV BOLUS (SEPSIS)
1000.0000 mL | Freq: Once | INTRAVENOUS | Status: AC
  Administered 2011-10-12: 1000 mL via INTRAVENOUS

## 2011-10-12 MED ORDER — SODIUM CHLORIDE 0.9 % IV SOLN
INTRAVENOUS | Status: DC
  Administered 2011-10-12 (×2): via INTRAVENOUS

## 2011-10-12 MED ORDER — DULOXETINE HCL 30 MG PO CPEP
30.0000 mg | ORAL_CAPSULE | Freq: Every day | ORAL | Status: DC
  Administered 2011-10-13: 30 mg via ORAL
  Filled 2011-10-12 (×2): qty 1

## 2011-10-12 MED ORDER — PANTOPRAZOLE SODIUM 40 MG PO TBEC
40.0000 mg | DELAYED_RELEASE_TABLET | Freq: Every day | ORAL | Status: DC
  Administered 2011-10-13 (×2): 40 mg via ORAL
  Filled 2011-10-12 (×2): qty 1

## 2011-10-12 MED ORDER — GI COCKTAIL ~~LOC~~
30.0000 mL | Freq: Once | ORAL | Status: AC
  Administered 2011-10-12: 30 mL via ORAL
  Filled 2011-10-12: qty 30

## 2011-10-12 MED ORDER — ONDANSETRON HCL 8 MG PO TABS: 4.0000 mg | ORAL_TABLET | Freq: Three times a day (TID) | ORAL | Status: DC | PRN

## 2011-10-12 MED ORDER — FAMOTIDINE 20 MG PO TABS
20.0000 mg | ORAL_TABLET | Freq: Once | ORAL | Status: AC
  Administered 2011-10-12: 20 mg via ORAL
  Filled 2011-10-12: qty 1

## 2011-10-12 MED ORDER — OLMESARTAN MEDOXOMIL 20 MG PO TABS
20.0000 mg | ORAL_TABLET | Freq: Every day | ORAL | Status: DC
  Administered 2011-10-13 (×2): 20 mg via ORAL
  Filled 2011-10-12 (×2): qty 1

## 2011-10-12 MED ORDER — GABAPENTIN 300 MG PO CAPS
300.0000 mg | ORAL_CAPSULE | Freq: Three times a day (TID) | ORAL | Status: DC
  Administered 2011-10-13 (×2): 300 mg via ORAL
  Filled 2011-10-12 (×3): qty 1

## 2011-10-12 MED ORDER — CLOPIDOGREL BISULFATE 75 MG PO TABS
75.0000 mg | ORAL_TABLET | Freq: Every day | ORAL | Status: DC
  Administered 2011-10-13 (×2): 75 mg via ORAL
  Filled 2011-10-12 (×3): qty 1

## 2011-10-12 MED ORDER — TRAZODONE HCL 50 MG PO TABS
50.0000 mg | ORAL_TABLET | Freq: Every day | ORAL | Status: DC
  Administered 2011-10-13: 50 mg via ORAL
  Filled 2011-10-12: qty 1

## 2011-10-12 MED ORDER — ALUM & MAG HYDROXIDE-SIMETH 200-200-20 MG/5ML PO SUSP: 30.0000 mL | ORAL | Status: DC | PRN

## 2011-10-12 MED ORDER — IBUPROFEN 200 MG PO TABS
600.0000 mg | ORAL_TABLET | Freq: Three times a day (TID) | ORAL | Status: DC | PRN
  Administered 2011-10-12 – 2011-10-13 (×2): 600 mg via ORAL
  Filled 2011-10-12 (×2): qty 3

## 2011-10-12 MED ORDER — HYDROCHLOROTHIAZIDE 12.5 MG PO CAPS
12.5000 mg | ORAL_CAPSULE | Freq: Every day | ORAL | Status: DC
  Administered 2011-10-13: 12.5 mg via ORAL
  Filled 2011-10-12 (×2): qty 1

## 2011-10-12 MED ORDER — POTASSIUM CHLORIDE CRYS ER 20 MEQ PO TBCR
10.0000 meq | EXTENDED_RELEASE_TABLET | Freq: Every day | ORAL | Status: DC
  Administered 2011-10-12 – 2011-10-13 (×2): 10 meq via ORAL
  Filled 2011-10-12 (×2): qty 1

## 2011-10-12 MED ORDER — TIOTROPIUM BROMIDE MONOHYDRATE 18 MCG IN CAPS
18.0000 ug | ORAL_CAPSULE | Freq: Every day | RESPIRATORY_TRACT | Status: DC
  Administered 2011-10-13 (×2): 18 ug via RESPIRATORY_TRACT
  Filled 2011-10-12: qty 5

## 2011-10-12 MED ORDER — ONDANSETRON HCL 4 MG/2ML IJ SOLN
4.0000 mg | Freq: Once | INTRAMUSCULAR | Status: AC
  Administered 2011-10-12: 4 mg via INTRAVENOUS
  Filled 2011-10-12: qty 2

## 2011-10-12 MED ORDER — MORPHINE SULFATE 2 MG/ML IJ SOLN
2.0000 mg | Freq: Once | INTRAMUSCULAR | Status: AC
  Administered 2011-10-12: 2 mg via INTRAVENOUS
  Filled 2011-10-12: qty 1

## 2011-10-12 MED ORDER — ZOLPIDEM TARTRATE 5 MG PO TABS
10.0000 mg | ORAL_TABLET | Freq: Every evening | ORAL | Status: DC | PRN
  Administered 2011-10-12: 10 mg via ORAL
  Filled 2011-10-12: qty 2

## 2011-10-12 MED ORDER — LORAZEPAM 1 MG PO TABS: 1.0000 mg | ORAL_TABLET | Freq: Three times a day (TID) | ORAL | Status: DC | PRN

## 2011-10-12 MED ORDER — ISOSORBIDE MONONITRATE ER 30 MG PO TB24
30.0000 mg | ORAL_TABLET | Freq: Every day | ORAL | Status: DC
  Administered 2011-10-13 (×2): 30 mg via ORAL
  Filled 2011-10-12 (×2): qty 1

## 2011-10-12 MED ORDER — ACETAMINOPHEN 325 MG PO TABS: 650.0000 mg | ORAL_TABLET | ORAL | Status: DC | PRN

## 2011-10-12 MED ORDER — OLMESARTAN MEDOXOMIL-HCTZ 20-12.5 MG PO TABS: 1.0000 | ORAL_TABLET | Freq: Every day | ORAL | Status: DC

## 2011-10-12 NOTE — ED Provider Notes (Signed)
History     CSN: [id number] Arrival date & time: 10/12/2011  3:31 PM   First MD Initiated Contact with Patient 10/12/11 1535      Chief Complaint  Patient presents with  . Chest Pain  . Alcohol Intoxication    (Consider location/radiation/quality/duration/timing/severity/associated sxs/prior treatment) HPI  Relates he started having chest pain off and on several days ago. He relates about 3 PM he was drinking beer states he had about 48 ounces and he started getting chest pain. He indicates the pain in his is in his left lower chest and it is a pressure pain. He states it hurts more when he breathes deep or coughs but nothing makes it go away. He denies nausea vomiting although he states he did have vomiting couple days ago. He denies diaphoresis shortness of breath. He states he has had a cough with white mucus but no fever. He denies swelling in his legs. He has had chest pain before and has seen Dr. Algie Coffer and had "some kind of test". He is not noted he has heart disease or not. He does not know if he has a stent or not. Patient relates he has problems with alcohol. He states he was last in detox about 3 months ago however he started drinking a few weeks ago. He states he's interested in detox again.  Primary care Dr. Everlene Other Cardiology Dr Algie Coffer  Past Medical History  Diagnosis Date  . Asthma   . Hypertension   . Coronary artery disease   . Hyperlipemia   alcoholism  No past surgical history on file.  No family history on file.  History  Substance Use Topics  . Smoking status: Former Smoker -- 2 years  . Smokeless tobacco: Not on file  . Alcohol Use: Yes     pt states that he has been drinking heavily for the past 4 days    former drug use Lives with her friend Retired Patient states he uses oxygen at night    Review of Systems  All other systems reviewed and are negative.    Allergies  Review of patient's allergies indicates no known allergies.  Home  Medications   Current Outpatient Rx  Name Route Sig Dispense Refill  . ALLOPURINOL 300 MG PO TABS Oral Take 300 mg by mouth 2 (two) times daily.      . CELECOXIB 200 MG PO CAPS Oral Take 200 mg by mouth 2 (two) times daily.      Marland Kitchen CLOPIDOGREL BISULFATE 75 MG PO TABS Oral Take 75 mg by mouth daily.      . COLCHICINE 0.6 MG PO TABS Oral Take 1.2 mg by mouth daily.      . DEXLANSOPRAZOLE 60 MG PO CPDR Oral Take 60 mg by mouth daily.      . DULOXETINE HCL 30 MG PO CPEP Oral Take 30 mg by mouth daily.      Marland Kitchen GABAPENTIN 300 MG PO CAPS Oral Take 300 mg by mouth 3 (three) times daily.      . ISOSORBIDE MONONITRATE ER 30 MG PO TB24 Oral Take 30 mg by mouth daily.      Marland Kitchen METOPROLOL TARTRATE 25 MG PO TABS Oral Take 25 mg by mouth 2 (two) times daily.      Marland Kitchen MONTELUKAST SODIUM 10 MG PO TABS Oral Take 10 mg by mouth at bedtime.      Marland Kitchen OLMESARTAN MEDOXOMIL-HCTZ 20-12.5 MG PO TABS Oral Take 1 tablet by mouth daily.      Marland Kitchen  POTASSIUM CHLORIDE 10 MEQ PO TBCR Oral Take 10 mEq by mouth.      Marland Kitchen TIOTROPIUM BROMIDE MONOHYDRATE 18 MCG IN CAPS Inhalation Place 18 mcg into inhaler and inhale daily.      . TRAZODONE HCL 50 MG PO TABS Oral Take 50 mg by mouth at bedtime.        BP 115/78  Pulse 72  Temp(Src) 98.1 F (36.7 C) (Oral)  Resp 19  SpO2 98%  Vital signs normal  Physical Exam  Vitals reviewed. Constitutional: He is oriented to person, place, and time. He appears well-developed and well-nourished.       The patient is alert and awake he does appear to be under the influence of alcohol.  HENT:  Head: Normocephalic and atraumatic.  Mouth/Throat: Uvula is midline, oropharynx is clear and moist and mucous membranes are normal.       Patient wears dentures  Eyes: Conjunctivae and EOM are normal. Pupils are equal, round, and reactive to light.  Neck: Normal range of motion. Neck supple.  Cardiovascular: Normal rate, regular rhythm and normal heart sounds.   Pulmonary/Chest: Effort normal and breath  sounds normal.  Abdominal: Soft. Bowel sounds are normal. He exhibits no distension and no mass. There is no tenderness. There is no rebound and no guarding.  Musculoskeletal: Normal range of motion.       No edema noted  Neurological: He is alert and oriented to person, place, and time.  Skin: Skin is warm and dry.  Psychiatric: He has a normal mood and affect. His behavior is normal. Thought content normal.    ED Course  Procedures (including critical care time)  Results for orders placed during the hospital encounter of 10/12/11  ETHANOL      Component Value Range   Alcohol, Ethyl (B) 252 (*) 0 - 11 (mg/dL)  CBC      Component Value Range   WBC 9.3  4.0 - 10.5 (K/uL)   RBC 4.32  4.22 - 5.81 (MIL/uL)   Hemoglobin 14.0  13.0 - 17.0 (g/dL)   HCT 40.9  81.1 - 91.4 (%)   MCV 92.1  78.0 - 100.0 (fL)   MCH 32.4  26.0 - 34.0 (pg)   MCHC 35.2  30.0 - 36.0 (g/dL)   RDW 78.2  95.6 - 21.3 (%)   Platelets 250  150 - 400 (K/uL)  DIFFERENTIAL      Component Value Range   Neutrophils Relative 50  43 - 77 (%)   Neutro Abs 4.6  1.7 - 7.7 (K/uL)   Lymphocytes Relative 45  12 - 46 (%)   Lymphs Abs 4.1 (*) 0.7 - 4.0 (K/uL)   Monocytes Relative 5  3 - 12 (%)   Monocytes Absolute 0.5  0.1 - 1.0 (K/uL)   Eosinophils Relative 1  0 - 5 (%)   Eosinophils Absolute 0.1  0.0 - 0.7 (K/uL)   Basophils Relative 0  0 - 1 (%)   Basophils Absolute 0.0  0.0 - 0.1 (K/uL)  COMPREHENSIVE METABOLIC PANEL      Component Value Range   Sodium 134 (*) 135 - 145 (mEq/L)   Potassium 3.9  3.5 - 5.1 (mEq/L)   Chloride 95 (*) 96 - 112 (mEq/L)   CO2 29  19 - 32 (mEq/L)   Glucose, Bld 90  70 - 99 (mg/dL)   BUN 5 (*) 6 - 23 (mg/dL)   Creatinine, Ser 0.86  0.50 - 1.35 (mg/dL)   Calcium 9.2  8.4 - 10.5 (mg/dL)   Total Protein 7.2  6.0 - 8.3 (g/dL)   Albumin 3.4 (*) 3.5 - 5.2 (g/dL)   AST 39 (*) 0 - 37 (U/L)   ALT 25  0 - 53 (U/L)   Alkaline Phosphatase 99  39 - 117 (U/L)   Total Bilirubin 0.5  0.3 - 1.2 (mg/dL)    GFR calc non Af Amer 88 (*) >90 (mL/min)   GFR calc Af Amer >90  >90 (mL/min)  LIPASE, BLOOD      Component Value Range   Lipase 76 (*) 11 - 59 (U/L)  APTT      Component Value Range   aPTT 30  24 - 37 (seconds)  PROTIME-INR      Component Value Range   Prothrombin Time 13.3  11.6 - 15.2 (seconds)   INR 0.99  0.00 - 1.49   URINE RAPID DRUG SCREEN (HOSP PERFORMED)      Component Value Range   Opiates NONE DETECTED  NONE DETECTED    Cocaine NONE DETECTED  NONE DETECTED    Benzodiazepines NONE DETECTED  NONE DETECTED    Amphetamines NONE DETECTED  NONE DETECTED    Tetrahydrocannabinol NONE DETECTED  NONE DETECTED    Barbiturates NONE DETECTED  NONE DETECTED   URINALYSIS, ROUTINE W REFLEX MICROSCOPIC      Component Value Range   Color, Urine YELLOW  YELLOW    Appearance CLEAR  CLEAR    Specific Gravity, Urine 1.004 (*) 1.005 - 1.030    pH 5.5  5.0 - 8.0    Glucose, UA NEGATIVE  NEGATIVE (mg/dL)   Hgb urine dipstick NEGATIVE  NEGATIVE    Bilirubin Urine NEGATIVE  NEGATIVE    Ketones, ur NEGATIVE  NEGATIVE (mg/dL)   Protein, ur NEGATIVE  NEGATIVE (mg/dL)   Urobilinogen, UA 0.2  0.0 - 1.0 (mg/dL)   Nitrite NEGATIVE  NEGATIVE    Leukocytes, UA NEGATIVE  NEGATIVE   POCT I-STAT TROPONIN I      Component Value Range   Troponin i, poc 0.01  0.00 - 0.08 (ng/mL)   Comment 3            Laboratory interpretation mild hyponatremia and low chloride, intoxicated, mildly elevated lipase with normal liver function tests   Dg Chest Portable 1 View  10/12/2011  *RADIOLOGY REPORT*  Clinical Data: Chest pain hypertension  CHEST - 1 VIEW  Comparison:  None.  Findings: The heart size and mediastinal contours are within normal limits.  Both lungs are clear.  Mild hyperinflation evident.  IMPRESSION: No active disease.  Original Report Authenticated By: Judie Petit. Ruel Favors, M.D.    Date: 10/12/2011  Rate: 73  Rhythm: indeterminate  QRS Axis: normal  Intervals: normal  ST/T Wave abnormalities:  normal  Conduction Disutrbances:none  Narrative Interpretation: PVC's NSTWC  Old EKG Reviewed: none available   Course Pt states he wants to be admitted for alcohol rehab. States he has depression, has had suicidal thoughts, but won't tell me about them.  Pt still has pain and now indicates his abdomen. He then asks the nurse if he can eat.  20:15 Berna Spare, ACT aware patient needs evaluation.  Diagnoses that have been ruled out:  Diagnoses that are still under consideration:  Final diagnoses:  Alcohol abuse  Depression  Chest pain    Plan  Admit for alcohol detox.  Devoria Albe, MD, FACEP   MDM          Ward Givens, MD 10/13/11  0011 

## 2011-10-12 NOTE — ED Notes (Signed)
Pt states pain 8/10.  Given gi coctail per Dr Lynelle Doctor.  Sats in low 90's, lung sounds diminished.  Pt states he has o2 at home and uses it when he needs it.  Placed on 2L and sats of 95%.

## 2011-10-12 NOTE — ED Notes (Signed)
ekg has been done and given to dr. Knapp. 

## 2011-10-12 NOTE — ED Notes (Signed)
Urine sample has been collected if needed. 

## 2011-10-12 NOTE — ED Notes (Signed)
Attempted access x 2, 2nd rn to attempt

## 2011-10-12 NOTE — ED Notes (Signed)
Pt states that he has been drinking heavily for the past 4 days and states for the past 2 he has been hurting "around his heart".  States that he has heart problems but is unsure of exactly what.  States that he has been drinking beer and no drug use
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
## 2011-10-12 NOTE — ED Notes (Signed)
Pt given more apple juice

## 2011-10-13 ENCOUNTER — Encounter (HOSPITAL_COMMUNITY): Payer: Self-pay | Admitting: *Deleted

## 2011-10-13 ENCOUNTER — Inpatient Hospital Stay (HOSPITAL_COMMUNITY)
Admission: AD | Admit: 2011-10-13 | Discharge: 2011-10-17 | DRG: 897 | Disposition: A | Discharge status: Home / Self Care | Payer: 59 | Source: Ambulatory Visit | Attending: Psychiatry | Admitting: Psychiatry

## 2011-10-13 DIAGNOSIS — K449 Diaphragmatic hernia without obstruction or gangrene: Secondary | ICD-10-CM

## 2011-10-13 DIAGNOSIS — J4489 Other specified chronic obstructive pulmonary disease: Secondary | ICD-10-CM

## 2011-10-13 DIAGNOSIS — I2510 Atherosclerotic heart disease of native coronary artery without angina pectoris: Secondary | ICD-10-CM

## 2011-10-13 DIAGNOSIS — F3289 Other specified depressive episodes: Secondary | ICD-10-CM

## 2011-10-13 DIAGNOSIS — I509 Heart failure, unspecified: Secondary | ICD-10-CM

## 2011-10-13 DIAGNOSIS — J449 Chronic obstructive pulmonary disease, unspecified: Secondary | ICD-10-CM

## 2011-10-13 DIAGNOSIS — M129 Arthropathy, unspecified: Secondary | ICD-10-CM

## 2011-10-13 DIAGNOSIS — F329 Major depressive disorder, single episode, unspecified: Secondary | ICD-10-CM

## 2011-10-13 DIAGNOSIS — Z87891 Personal history of nicotine dependence: Secondary | ICD-10-CM

## 2011-10-13 DIAGNOSIS — Z560 Unemployment, unspecified: Secondary | ICD-10-CM

## 2011-10-13 DIAGNOSIS — Z79899 Other long term (current) drug therapy: Secondary | ICD-10-CM

## 2011-10-13 DIAGNOSIS — F1020 Alcohol dependence, uncomplicated: Principal | ICD-10-CM

## 2011-10-13 DIAGNOSIS — E785 Hyperlipidemia, unspecified: Secondary | ICD-10-CM

## 2011-10-13 DIAGNOSIS — I10 Essential (primary) hypertension: Secondary | ICD-10-CM

## 2011-10-13 HISTORY — DX: Shortness of breath: R06.02

## 2011-10-13 HISTORY — DX: Unspecified osteoarthritis, unspecified site: M19.90

## 2011-10-13 HISTORY — DX: Chronic obstructive pulmonary disease, unspecified: J44.9

## 2011-10-13 HISTORY — DX: Angina pectoris, unspecified: I20.9

## 2011-10-13 HISTORY — DX: Diaphragmatic hernia without obstruction or gangrene: K44.9

## 2011-10-13 HISTORY — DX: Mental disorder, not otherwise specified: F99

## 2011-10-13 MED ORDER — OLMESARTAN MEDOXOMIL-HCTZ 20-12.5 MG PO TABS: 1.0000 | ORAL_TABLET | Freq: Every day | ORAL | Status: DC

## 2011-10-13 MED ORDER — THERA M PLUS PO TABS
1.0000 | ORAL_TABLET | Freq: Every day | ORAL | Status: DC
  Administered 2011-10-13 – 2011-10-17 (×5): 1 via ORAL
  Filled 2011-10-13 (×5): qty 1

## 2011-10-13 MED ORDER — ALBUTEROL SULFATE HFA 108 (90 BASE) MCG/ACT IN AERS: 2.0000 | INHALATION_SPRAY | RESPIRATORY_TRACT | Status: DC

## 2011-10-13 MED ORDER — POTASSIUM CHLORIDE CRYS ER 10 MEQ PO TBCR
10.0000 meq | EXTENDED_RELEASE_TABLET | Freq: Two times a day (BID) | ORAL | Status: DC
  Administered 2011-10-14 – 2011-10-17 (×7): 10 meq via ORAL
  Filled 2011-10-13 (×11): qty 1

## 2011-10-13 MED ORDER — CHLORDIAZEPOXIDE HCL 25 MG PO CAPS
25.0000 mg | ORAL_CAPSULE | Freq: Three times a day (TID) | ORAL | Status: AC
  Administered 2011-10-14 – 2011-10-15 (×4): 25 mg via ORAL
  Filled 2011-10-13 (×2): qty 1

## 2011-10-13 MED ORDER — PANTOPRAZOLE SODIUM 40 MG PO TBEC
40.0000 mg | DELAYED_RELEASE_TABLET | Freq: Every day | ORAL | Status: DC
  Administered 2011-10-14 – 2011-10-17 (×4): 40 mg via ORAL
  Filled 2011-10-13 (×5): qty 1

## 2011-10-13 MED ORDER — OLMESARTAN MEDOXOMIL 20 MG PO TABS
20.0000 mg | ORAL_TABLET | Freq: Every day | ORAL | Status: DC
  Administered 2011-10-13 – 2011-10-17 (×5): 20 mg via ORAL
  Filled 2011-10-13 (×8): qty 1

## 2011-10-13 MED ORDER — HYDROCHLOROTHIAZIDE 12.5 MG PO CAPS
12.5000 mg | ORAL_CAPSULE | Freq: Every day | ORAL | Status: DC
  Administered 2011-10-13 – 2011-10-17 (×5): 12.5 mg via ORAL
  Filled 2011-10-13 (×7): qty 1

## 2011-10-13 MED ORDER — THIAMINE HCL 100 MG/ML IJ SOLN
100.0000 mg | Freq: Once | INTRAMUSCULAR | Status: AC
  Administered 2011-10-13: 100 mg via INTRAMUSCULAR

## 2011-10-13 MED ORDER — LOPERAMIDE HCL 2 MG PO CAPS: 2.0000 mg | ORAL_CAPSULE | ORAL | Status: AC | PRN

## 2011-10-13 MED ORDER — ONDANSETRON 4 MG PO TBDP: 4.0000 mg | ORAL_TABLET | Freq: Four times a day (QID) | ORAL | Status: AC | PRN

## 2011-10-13 MED ORDER — METOPROLOL TARTRATE 25 MG PO TABS
25.0000 mg | ORAL_TABLET | Freq: Two times a day (BID) | ORAL | Status: DC
  Administered 2011-10-14 – 2011-10-17 (×7): 25 mg via ORAL
  Filled 2011-10-13 (×11): qty 1

## 2011-10-13 MED ORDER — ALLOPURINOL 300 MG PO TABS
300.0000 mg | ORAL_TABLET | Freq: Two times a day (BID) | ORAL | Status: DC
  Administered 2011-10-14 – 2011-10-17 (×7): 300 mg via ORAL
  Filled 2011-10-13 (×12): qty 1

## 2011-10-13 MED ORDER — CLOPIDOGREL BISULFATE 75 MG PO TABS
75.0000 mg | ORAL_TABLET | Freq: Every day | ORAL | Status: DC
  Administered 2011-10-14 – 2011-10-17 (×4): 75 mg via ORAL
  Filled 2011-10-13 (×5): qty 1

## 2011-10-13 MED ORDER — ISOSORBIDE MONONITRATE ER 30 MG PO TB24
30.0000 mg | ORAL_TABLET | Freq: Every day | ORAL | Status: DC
  Administered 2011-10-14 – 2011-10-17 (×4): 30 mg via ORAL
  Filled 2011-10-13 (×6): qty 1

## 2011-10-13 MED ORDER — MAGNESIUM HYDROXIDE 400 MG/5ML PO SUSP: 30.0000 mL | Freq: Every day | ORAL | Status: DC | PRN

## 2011-10-13 MED ORDER — CHLORDIAZEPOXIDE HCL 25 MG PO CAPS
25.0000 mg | ORAL_CAPSULE | Freq: Four times a day (QID) | ORAL | Status: AC | PRN
  Administered 2011-10-14 – 2011-10-15 (×2): 25 mg via ORAL
  Filled 2011-10-13 (×2): qty 1

## 2011-10-13 MED ORDER — ALUM & MAG HYDROXIDE-SIMETH 200-200-20 MG/5ML PO SUSP: 30.0000 mL | ORAL | Status: DC | PRN

## 2011-10-13 MED ORDER — HYDROXYZINE PAMOATE 25 MG PO CAPS: 25.0000 mg | ORAL_CAPSULE | Freq: Four times a day (QID) | ORAL | Status: AC | PRN

## 2011-10-13 MED ORDER — CHLORDIAZEPOXIDE HCL 25 MG PO CAPS
25.0000 mg | ORAL_CAPSULE | ORAL | Status: AC
  Administered 2011-10-15 – 2011-10-16 (×2): 25 mg via ORAL
  Filled 2011-10-13 (×2): qty 1

## 2011-10-13 MED ORDER — CHLORDIAZEPOXIDE HCL 25 MG PO CAPS
25.0000 mg | ORAL_CAPSULE | Freq: Four times a day (QID) | ORAL | Status: AC
  Administered 2011-10-13 – 2011-10-14 (×4): 25 mg via ORAL
  Filled 2011-10-13 (×4): qty 1

## 2011-10-13 MED ORDER — CHLORDIAZEPOXIDE HCL 25 MG PO CAPS
25.0000 mg | ORAL_CAPSULE | Freq: Every day | ORAL | Status: AC
  Administered 2011-10-17: 25 mg via ORAL
  Filled 2011-10-13: qty 1

## 2011-10-13 MED ORDER — ALBUTEROL SULFATE HFA 108 (90 BASE) MCG/ACT IN AERS
INHALATION_SPRAY | RESPIRATORY_TRACT | Status: AC
  Filled 2011-10-13: qty 6.7

## 2011-10-13 MED ORDER — DULOXETINE HCL 30 MG PO CPEP
30.0000 mg | ORAL_CAPSULE | Freq: Every day | ORAL | Status: DC
  Administered 2011-10-14 – 2011-10-17 (×4): 30 mg via ORAL
  Filled 2011-10-13 (×6): qty 1

## 2011-10-13 MED ORDER — TRAZODONE HCL 50 MG PO TABS
50.0000 mg | ORAL_TABLET | Freq: Every day | ORAL | Status: DC
  Administered 2011-10-13 – 2011-10-15 (×3): 50 mg via ORAL
  Filled 2011-10-13 (×5): qty 1

## 2011-10-13 MED ORDER — MONTELUKAST SODIUM 10 MG PO TABS
10.0000 mg | ORAL_TABLET | Freq: Every day | ORAL | Status: DC
  Administered 2011-10-13 – 2011-10-16 (×4): 10 mg via ORAL
  Filled 2011-10-13 (×6): qty 1

## 2011-10-13 MED ORDER — TIOTROPIUM BROMIDE MONOHYDRATE 18 MCG IN CAPS
18.0000 ug | ORAL_CAPSULE | Freq: Every day | RESPIRATORY_TRACT | Status: DC
  Administered 2011-10-14 – 2011-10-17 (×4): 18 ug via RESPIRATORY_TRACT
  Filled 2011-10-13 (×2): qty 5

## 2011-10-13 MED ORDER — GABAPENTIN 300 MG PO CAPS
300.0000 mg | ORAL_CAPSULE | Freq: Three times a day (TID) | ORAL | Status: DC
  Administered 2011-10-14 – 2011-10-17 (×11): 300 mg via ORAL
  Filled 2011-10-13 (×16): qty 1

## 2011-10-13 MED ORDER — ALBUTEROL SULFATE HFA 108 (90 BASE) MCG/ACT IN AERS
2.0000 | INHALATION_SPRAY | RESPIRATORY_TRACT | Status: DC | PRN
  Administered 2011-10-13 – 2011-10-16 (×4): 2 via RESPIRATORY_TRACT

## 2011-10-13 MED ORDER — CELECOXIB 100 MG PO CAPS
200.0000 mg | ORAL_CAPSULE | Freq: Two times a day (BID) | ORAL | Status: DC
  Administered 2011-10-14: 100 mg via ORAL
  Administered 2011-10-14 – 2011-10-17 (×6): 200 mg via ORAL
  Filled 2011-10-13 (×12): qty 1

## 2011-10-13 MED ORDER — VITAMIN B-1 100 MG PO TABS
100.0000 mg | ORAL_TABLET | Freq: Every day | ORAL | Status: DC
  Administered 2011-10-14 – 2011-10-17 (×4): 100 mg via ORAL
  Filled 2011-10-13 (×6): qty 1

## 2011-10-13 NOTE — Progress Notes (Signed)
Assessment Note   Zachary [name] is an 70 y.o. male that presented to the emergency department requesting alcohol detox.  Pt was initially inebriated and unable to complete assessment.  This a.m. Pt is now oriented and continues to request detox for his ongoing alcohol use.  Pt reports drinking up to or more than a 12 pk QD for last several months since [facility] d/c in May.  Pt also admits "trying Crack several times" in recent months.  Pt's CIWA score is currently 10 and he reports stomach pain and anxiety. Pt denies SI, HI, or any active psychosis, but does admit depression a/w his ongoing use.  Pt would like to be admitted inpt for tx of alcohol.  Pt has MCR and MCD.  Pt is able to contract for safety.  Please run for possible inpatient treatment.  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 Asthma   . Hypertension   . Coronary artery disease   . Hyperlipemia    Axis IV: other psychosocial or environmental problems, problems with primary support group and non-compliance with treatment Axis V: 31-40 impairment in reality testing  Past Medical History:  Past Medical History  Diagnosis Date  . Asthma   . Hypertension   . Coronary artery disease   . Hyperlipemia     No past surgical history on file.  Family History: No family history on file.  Social History:  reports that he has quit smoking. He does not have any smokeless tobacco history on file. He reports that he drinks alcohol. He reports that he does not use illicit drugs.  Allergies: No Known Allergies  Home Medications:  Medications Prior to Admission  Medication Dose Route Frequency Provider Last Rate Last Dose  . 0.9 %  sodium chloride infusion   Intravenous Continuous Ward Givens, MD 100 mL/hr at 10/12/11 2052    . acetaminophen (TYLENOL) tablet 650 mg  650 mg Oral Q4H PRN Ward Givens, MD      . allopurinol (ZYLOPRIM) tablet 300 mg  300 mg Oral  BID Ward Givens, MD   300 mg at 10/13/11 0120  . alum & mag hydroxide-simeth (MAALOX/MYLANTA) 200-200-20 MG/5ML suspension 30 mL  30 mL Oral PRN Ward Givens, MD      . celecoxib (CELEBREX) capsule 200 mg  200 mg Oral BID Ward Givens, MD   200 mg at 10/13/11 0120  . clopidogrel (PLAVIX) tablet 75 mg  75 mg Oral Daily Ward Givens, MD   75 mg at 10/13/11 0000  . colchicine tablet 1.2 mg  1.2 mg Oral Daily Ward Givens, MD      . DULoxetine (CYMBALTA) DR capsule 30 mg  30 mg Oral Daily Ward Givens, MD      . famotidine (PEPCID) tablet 20 mg  20 mg Oral Once Ward Givens, MD   20 mg at 10/12/11 2005  . gabapentin (NEURONTIN) capsule 300 mg  300 mg Oral TID Ward Givens, MD   300 mg at 10/13/11 0120  . gi cocktail  30 mL Oral Once Ward Givens, MD   30 mL at 10/12/11 2006  . hydrochlorothiazide (MICROZIDE) capsule 12.5 mg  12.5 mg Oral Daily Ward Givens, MD      . ibuprofen (ADVIL,MOTRIN) tablet 600 mg  600 mg Oral Q8H PRN Ward Givens, MD   600 mg at 10/12/11 2359  .  isosorbide mononitrate (IMDUR) 24 hr tablet 30 mg  30 mg Oral Daily Ward Givens, MD   30 mg at 10/13/11 0120  . LORazepam (ATIVAN) tablet 1 mg  1 mg Oral Q8H PRN Ward Givens, MD      . metoprolol tartrate (LOPRESSOR) tablet 25 mg  25 mg Oral BID Ward Givens, MD   25 mg at 10/13/11 0120  . montelukast (SINGULAIR) tablet 10 mg  10 mg Oral QHS Ward Givens, MD   10 mg at 10/13/11 0120  . morphine 2 MG/ML injection 2 mg  2 mg Intravenous Once Ward Givens, MD   2 mg at 10/12/11 1615  . nicotine (NICODERM CQ - dosed in mg/24 hours) patch 21 mg  21 mg Transdermal Daily Ward Givens, MD   21 mg at 10/13/11 0120  . olmesartan (BENICAR) tablet 20 mg  20 mg Oral Daily Ward Givens, MD   20 mg at 10/13/11 0120  . ondansetron (ZOFRAN) injection 4 mg  4 mg Intravenous Once Ward Givens, MD   4 mg at 10/12/11 1615  . ondansetron (ZOFRAN) tablet 4 mg  4 mg Oral Q8H PRN Ward Givens, MD      . pantoprazole (PROTONIX) EC tablet 40 mg  40 mg Oral Q1200 Ward Givens,  MD   40 mg at 10/13/11 0000  . potassium chloride SA (K-DUR,KLOR-CON) CR tablet 10 mEq  10 mEq Oral Daily Ward Givens, MD   10 mEq at 10/12/11 2358  . sodium chloride 0.9 % bolus 1,000 mL  1,000 mL Intravenous Once Ward Givens, MD   1,000 mL at 10/12/11 1615  . tiotropium (SPIRIVA) inhalation capsule 18 mcg  18 mcg Inhalation Daily Ward Givens, MD   18 mcg at 10/13/11 0126  . traZODone (DESYREL) tablet 50 mg  50 mg Oral QHS Ward Givens, MD   50 mg at 10/13/11 0120  . zolpidem (AMBIEN) tablet 10 mg  10 mg Oral QHS PRN Ward Givens, MD   10 mg at 10/12/11 2359  . DISCONTD: olmesartan-hydrochlorothiazide (BENICAR HCT) 20-12.5 MG per tablet 1 tablet  1 tablet Oral Daily Ward Givens, MD       No current outpatient prescriptions on file as of 10/12/2011.    OB/GYN Status:  No LMP for male patient.  General Assessment Data Living Arrangements: Non-Relatives Can pt return to current living arrangement?: Yes Admission Status: Voluntary Is patient capable of signing voluntary admission?: Yes Transfer from: Acute Hospital Referral Source: MD  Risk to self Suicidal Ideation: No-Not Currently/Within Last 6 Months Suicidal Intent: No-Not Currently/Within Last 6 Months Is patient at risk for suicide?: No Suicidal Plan?: No-Not Currently/Within Last 6 Months Access to Means: No What has been your use of drugs/alcohol within the last 12 months?: Pt reports drinking more than a 12 pk QD for months and tried Crack several months ago "a few times."  Other Self Harm Risks: None Intentional Self Injurious Behavior: None Factors that decrease suicide risk: Positive social support Family Suicide History: No Persecutory voices/beliefs?: No Depression: Yes Depression Symptoms: Fatigue;Guilt;Feeling worthless/self pity Substance abuse history and/or treatment for substance abuse?: Yes Suicide prevention information given to non-admitted patients: Not applicable  Risk to Others Homicidal Ideation:  No Thoughts of Harm to Others: No Current Homicidal Intent: No Current Homicidal Plan: No Access to Homicidal Means: No History of harm to others?: No Assessment of Violence: None Noted Does patient have access  to weapons?: Yes (Comment) Criminal Charges Pending?: No Does patient have a court date: No  Mental Status Report Appear/Hygiene: Disheveled Eye Contact: Good Motor Activity: Unsteady Speech: Soft;Slurred Level of Consciousness: Drowsy Mood: Ambivalent Affect: Depressed Anxiety Level: Minimal Thought Processes: Relevant Judgement: Impaired Orientation: Person;Place;Time;Situation Obsessive Compulsive Thoughts/Behaviors: Moderate  Cognitive Functioning Concentration: Decreased Memory: Recent Impaired;Remote Intact IQ: Average Insight: Poor Impulse Control: Poor Appetite: Fair Sleep: No Change Total Hours of Sleep: 5  Vegetative Symptoms: None  Prior Inpatient/Outpatient Therapy Prior Therapy: Inpatient Prior Therapy Dates: 2012-[facility]; 2008-[facility]; 2002-[facility] Prior Therapy Facilty/Provider(s): [facility]; [facility]; [facility] Reason for Treatment: Detox            Values / Beliefs Cultural Requests During Hospitalization: None Spiritual Requests During Hospitalization: None        Additional Information 1:1 In Past 12 Months?: No CIRT Risk: No Elopement Risk: No Does patient have medical clearance?: Yes     Disposition:  Disposition Disposition of Patient: Referred to Patient referred to: Other (Comment) [facility] for inpatient treatment)  On Site Evaluation by:   Reviewed with Physician:     Angelica Ran 10/13/2011 8:25 AM

## 2011-10-13 NOTE — ED Notes (Signed)
Patient resting quietly at this time.  Side rails up and call bell in reach

## 2011-10-13 NOTE — ED Notes (Signed)
Pt given morning medications. Vital signs stable. Resting quietly watching television. No signs of distress at the time.

## 2011-10-13 NOTE — ED Notes (Signed)
Pt resting quietly at the time. No signs of distress. Vital signs stable.  
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
## 2011-10-13 NOTE — ED Notes (Signed)
Security and ER tech Femi at [employer] for transport to [facility]  Pt a/ox3, NAD, ambulatory with no verbal complaints at this time.
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
## 2011-10-13 NOTE — Tx Team (Signed)
Initial Interdisciplinary Treatment Plan  PATIENT STRENGTHS: (choose at least two) General fund of knowledge Supportive family/friends  PATIENT STRESSORS: Health problems Substance abuse   PROBLEM LIST: Problem List/Patient Goals Date to be addressed Date deferred Reason deferred Estimated date of resolution  Substance Abuse            Co-morbidities                                           DISCHARGE CRITERIA:  Verbal commitment to aftercare and medication compliance Withdrawal symptoms are absent or subacute and managed without 24-hour nursing intervention  PRELIMINARY DISCHARGE PLAN: Attend 12-step recovery group Return to previous living arrangement  PATIENT/FAMIILY INVOLVEMENT: This treatment plan has been presented to and reviewed with the patient, Zachary [name], and/or family member, Zachary [name].  The patient and family have been given the opportunity to ask questions and make suggestions.  Michele Rockers [facility] 10/13/2011, 5:46 PM

## 2011-10-13 NOTE — ED Notes (Signed)
[name] (step-daughter) [phone]

## 2011-10-13 NOTE — ED Notes (Signed)
Pt is alert, oriented, reports continued pain "around my heart".  Pt describes as a cramping pain that is intermittent.

## 2011-10-13 NOTE — Progress Notes (Signed)
This 70 yo M has been accepted for Adult Inpatient Services by Lynann Bologna, NP to Dr. Dan Humphreys to bed 307.02 at 12pm.  All support paperwork has been completed and faxed to [facility] for review.  Dr. And nursing staff notified and agreeable with transfer.

## 2011-10-13 NOTE — ED Notes (Signed)
Patient resting quietly at this time.  Side rails up, call bell in reach

## 2011-10-13 NOTE — Progress Notes (Signed)
  1700 Admission note:  Pt is a 70 y/o s/m admitted following medical clearance requesting alcohol detox. States drinking has increased significantly over the past week to a 12 pack per day. Denies other SA.  Had a 10 year period of sobriety at one time.  Presents cooperative but minimizing of severity of symptoms of addiction saying "I've been drinking for 50 years".  Denies SI/HI and no overt s/s of depressed mood. States he lives with a friend with whom he can return at d/c.  Multiple medical problems including HTN,COPD, arthritis, CAD, and gout. Mild CP that radiates to back. Belonging and body search completed with orientation to unit and initiation of 15' checks.  Librium protocol initiated. Fluids encouraged. Stevinson Cellar RN

## 2011-10-13 NOTE — ED Notes (Signed)
Pt resting quietly at the time. Eating breakfast. Vital signs stable. No signs of distress noted at present. Pharmacy notified of scheduled medications.

## 2011-10-14 DIAGNOSIS — F1010 Alcohol abuse, uncomplicated: Secondary | ICD-10-CM

## 2011-10-14 DIAGNOSIS — F329 Major depressive disorder, single episode, unspecified: Secondary | ICD-10-CM

## 2011-10-14 MED ORDER — ALBUTEROL SULFATE (5 MG/ML) 0.5% IN NEBU
2.5000 mg | INHALATION_SOLUTION | RESPIRATORY_TRACT | Status: DC | PRN
  Administered 2011-10-14 – 2011-10-17 (×4): 2.5 mg via RESPIRATORY_TRACT
  Filled 2011-10-14 (×3): qty 0.5

## 2011-10-14 NOTE — Progress Notes (Signed)
  The patient has a flat affect and depressed mood. He presents as helpless and looking for someone to take care of him.  Impaired judgment and limited insight regarding his substance abuse. He did attend evening AA group. Support and encouragement given. Safety maintained.

## 2011-10-14 NOTE — Progress Notes (Deleted)
[facility] Group Notes:  (Counselor/Nursing/MHT/Case Management/Adjunct)  10/14/2011 1:115PM  Type of Therapy:  Counseling / Dance/Movement Therapy  Participation Level:  Did Not Attend   Kym Groom 10/14/2011, 3:17 PM

## 2011-10-14 NOTE — Progress Notes (Signed)
Pt affect is blunted. Pt complains of not being able to sleep but throughout the day has been able to take a few naps. Pt is pleasant and does attend groups. Pt was offered support and encouragement. Pt is receptive to treatment and safety maintained on unit.

## 2011-10-14 NOTE — Progress Notes (Signed)
Suicide Risk Assessment  Admission Assessment     Demographic factors:    Current Mental Status:    Loss Factors:    Historical Factors:    Risk Reduction Factors:     CLINICAL FACTORS:   Depression:   Comorbid alcohol abuse/dependence Impulsivity Insomnia Alcohol/Substance Abuse/Dependencies Previous Psychiatric Diagnoses and Treatments Medical Diagnoses and Treatments/Surgeries  COGNITIVE FEATURES THAT CONTRIBUTE TO RISK:  Polarized thinking    SUICIDE RISK:   Minimal: No identifiable suicidal ideation.  Patients presenting with no risk factors but with morbid ruminations; may be classified as minimal risk based on the severity of the depressive symptoms  PLAN OF CARE: This is a 70 years old the male [facility] with the alcohol abuse versus dependence. The patient relapsed alcohol drinking one to 2 weeks ago. He drinks beer upto 24 cans of beer per day. Patient has a history of for rehabilitation at [facility] in [facility] about 15 years ago. Patient started drinking when he was 70 years old. He has a previous psychiatric admission for detox in 6-7 months ago. He has been suffering with the chronic obstructive pulmonary disease, quit smoking about a year ago. He has history of arthritis, gout and/or heart problems. Family history: patient has no family members, he has no children, he lives in [place] x 40 years. Patient seeking detox services and also interested in rehabilitation after discharge. Patient has no history of suicide ideations, and has no evidence of her psychotic symptoms. Patient has denied delirium tremens or seizures but he has black out and fell down in the past. Patient has status post right index finger amputated. Patient will be receiving alcohol detox protocol and will be discharged to rehabilitation services when it is appropriate.    Mitchelle Goerner,JANARDHAHA R. 10/14/2011, 2:23 PM

## 2011-10-14 NOTE — Progress Notes (Signed)
The patient has an anxious mood and affect. He requested a nebulizer treatment. O2 sat was 95.  Slight tremors noted. Limited insight regarding his health and taking better care of himself.  Social and interacting in the milieu.

## 2011-10-14 NOTE — Progress Notes (Signed)
Pt. Did not attend group on communication  Wandra Scot 10/14/2011 7:56 PM

## 2011-10-14 NOTE — Progress Notes (Signed)
[facility] Group Notes:  (Counselor/Nursing/MHT/Case Management/Adjunct)  10/14/2011 1:15PM  Type of Therapy:  Counseling / Dance/Movement Therapy  Participation Level:  Did Not Attend   Kym Groom 10/14/2011, 3:17 PM

## 2011-10-14 NOTE — Progress Notes (Signed)
Pt. attended and participated in aftercare planning group. Pt. accepted information on suicide prevention, warning signs to look for with suicide and crisis line numbers to use. The pt. agreed to call crisis line numbers if having warning signs or having thoughts of suicide. Pt. listed their current anxiety level as an 8-9 for he has not been sleeping well.

## 2011-10-15 NOTE — Progress Notes (Signed)
Pt has been flat and depressed on the unit. Pt reported that he was having some anxiety and tremors could be felt at fingertips. Pt got two doses of Librium this morning, he reported that the Librium was helping with his withdrawals. Patient's CIWA was a 5, no other additional medication was given or requested. Pt reported being negative SI/HI, no AH/VH noted. Pt did report trouble breathing this morning, and was given a breathing treatment. Pt reported that the breathing treatment helps to break up the mucous. Pt has been to all groups and appears to be engaging in treatment.

## 2011-10-15 NOTE — Progress Notes (Signed)
[facility] MD Progress Note  10/15/2011 12:20 PM  Diagnosis:  Alcohol abuse vs dependence  ADL's: Intact  Sleep:  Yes,  AEB:  Appetite:  Yes,  AEB:  Suicidal Ideation:   Plan:  No  Intent:  No  Means:  No  Homicidal Ideation:   Plan:  No  Intent:  No  Means:  No  AEB (as evidenced by):  Mental Status: General Appearance Zachary [name]:  Casual Eye Contact:  Good Motor Behavior:  Restlestness Speech:  Normal Level of Consciousness:  Drowsy Mood:  Angry, Anxious, Depressed and Irritable Affect:  constricted Anxiety Level:  Moderate Thought Process:  Coherent and Relevant Thought Content:  WNL Perception:  Normal Judgment:  Poor Insight:  Absent Cognition:  Orientation time, place and person Memory Immediate Concentration Yes Sleep:  Number of Hours: 5.25   Vital Signs:Blood pressure 143/82, pulse 76, temperature 98 F (36.7 C), temperature source Oral, resp. rate 24, height 5\' 5"  (1.651 m), weight 84.369 kg (186 lb), SpO2 95.00%.  Lab Results: No results found for this or any previous visit (from the past 48 hour(s)).   Treatment Plan Summary: Daily contact with patient to assess and evaluate symptoms and progress in treatment Medication management  Plan: Continue treatment plan and no medication changes.    Witney Huie,JANARDHAHA R. 10/15/2011, 12:20 PM

## 2011-10-15 NOTE — Progress Notes (Signed)
During morning medication administration pt complained of some SOB, he requested a nebulizer treatment. Treatment was administered, O2 sat was 95.

## 2011-10-15 NOTE — Progress Notes (Signed)
[facility] Group Notes: (Counselor/Nursing/MHT/Case Management/Adjunct)   10/15/2011 1:15PM  Type of Therapy: Counseling Group / Dance/Movement Therapy  Participation Level: Minimal  Participation Quality: Appropriate  Affect: Appropriate  Cognitive: Appropriate  Insight: Limited  Engagement in Group: Limited  Engagement in Therapy: Limited  Modes of Intervention: Clarification, Education, Problem-solving, Socialization and Support   Summary of Progress/Problems:  Group discussed sources of support in their lives and the difference between what makes supports healthy or unhealthy, as well as how to identify them. Pt. shared that the family he lives with gives support by "keeping [him] straight." Group also focused setting safe boundaries with themselves and with others when encountering triggers by saying "no" or "stop".   Joelee Snoke  10/15/2011, 3:44 PM

## 2011-10-15 NOTE — Progress Notes (Signed)
[facility] Group Notes:  (Counselor/Nursing/MHT/Case Management/Adjunct)  10/15/2011 9:04 PM  Type of Therapy:  Psychoeducational Skills  Participation Level:  Minimal  Participation Quality:  Attentive  Affect:  Blunted  Cognitive:  Appropriate  Insight:  Limited  Engagement in Group:  None  Engagement in Therapy:  n/a  Modes of Intervention:  Activity, Education, Problem-solving, Socialization and Support  Summary of Progress/Problems: Group focused on support systems. Oluwaseun participated minimally in game asking questions about self and peers. Group discussed who their support people/systems are and what they offer, Aurelio did not seem to hear/or be listening to group discussion. Group was given handout and discussed how quality time can be used to maintain healthy support relationships. Vishruth was given homework assignment to evaluate a current relationship and create an action plan to improve it.   Wandra Scot 10/15/2011, 9:04 PM

## 2011-10-15 NOTE — Progress Notes (Signed)
Pt. is a 70 year old male here for detox from alcohol. Pt. was asked for consent to contact family member or friend, but because he does not have access to his phone, he is unable to provide a contact number for Juanetta Snow at this time and was unsure of how to get the number. Pt. was given and explained Suicide Prevention Information and pt. verbally acknowledged that he understood and will use this information if needed.

## 2011-10-16 ENCOUNTER — Encounter (INDEPENDENT_AMBULATORY_CARE_PROVIDER_SITE_OTHER): Payer: Medicare Other | Admitting: Surgery

## 2011-10-16 MED ORDER — TRAZODONE HCL 100 MG PO TABS
100.0000 mg | ORAL_TABLET | Freq: Every day | ORAL | Status: DC
  Administered 2011-10-16: 100 mg via ORAL
  Filled 2011-10-16 (×3): qty 1

## 2011-10-16 NOTE — Progress Notes (Signed)
Patient has been isolative today; staying in his room and lying in bed.  Patient has labored breathing today and needs a nebulizer treatment which has been pulled for him when he awakens.  Patient denies any SI/HI/AVH.  He continues to strive for maintenance of his sobriety.  Patient denies any depression, but rates his hopelessness as an 8.  He has poor attendance in group.  His appearance remains disheveled and hygiene poor.  He has generalized pain and has not requested any prns.  Continue to assess and maintain safety.  Patient has intact thought processes; fair eye contact; alert & oriented X4.

## 2011-10-16 NOTE — Progress Notes (Signed)
[facility] Group Notes:  (Counselor/Nursing/MHT/Case Management/Adjunct)  10/16/2011 4:24 PM  Type of Therapy:  group therapy  Participation Level:  Did Not Attend  Purcell Nails 10/16/2011, 4:24 PM

## 2011-10-16 NOTE — Progress Notes (Signed)
  The patient has a flat affect and depressed mood. He denies any signs or symptoms of withdrawal. Denies suicidal ideation. Interacting appropriately in the milieu. Coughing up large amounts of mucous. Tightness in chest, relieved by inhaler. Will continue to monitor.

## 2011-10-16 NOTE — Progress Notes (Signed)
[facility] Group Notes:  (Counselor/Nursing/MHT/Case Management/Adjunct)  10/16/2011 2:21 PM  Type of Therapy:  Group Therapy  Participation Level:  Did Not Attend  Summary of Progress/Problems:   Purcell Nails 10/16/2011, 2:21 PM

## 2011-10-16 NOTE — Progress Notes (Signed)
Pt attended AM group, good participation.  Stated he came here to get off of alcohol. Wants to get part time job.  When I asked for clarification, he explained that he has too much time on his hands, thus the drinking.  I suggested there may be other options, such as volunteering, or going to groups in the community to focus on wellness and sobriety.  Appeared to be open to this possibility.  States he has many friends that are supportive, but when I talked about calling them with him, he explained his phone is at home and all the numbers are in the phone, not his head.Likely d/c in 1-2 days.

## 2011-10-16 NOTE — Progress Notes (Signed)
[facility] MD Progress Note  10/16/2011 7:26 PM  Diagnosis:   Axis I: Alcohol Abuse Axis II: No diagnosis Axis III:  Past Medical History  Diagnosis Date  . Hypertension   . Coronary artery disease   . Hyperlipemia   . No pertinent past medical history   . Angina   . Shortness of breath   . Mental disorder   . Arthritis   . COPD (chronic obstructive pulmonary disease)   . Hiatal hernia     ADL's:  Intact  Sleep:  No  Appetite:  Yes,  AEB:  Suicidal Ideation:   Plan:  No  Intent:  No  Means:  No  Homicidal Ideation:   Plan:  No  Intent:  No  Means:  No  AEB (as evidenced by):eating most of his meals.  Mental Status: General Appearance Zachary [name]:  Disheveled Eye Contact:  Fair Motor Behavior:  Normal Speech:  Garbled Level of Consciousness:  Alert Mood:  Euthymic Affect:  Blunt Anxiety Level:  None Thought Process:  Coherent Thought Content:  WNL Perception:  Normal Judgment:  Fair Insight:  Present Cognition:  Orientation time, place and person Sleep:  Number of Hours: 4.25   Vital Signs:Blood pressure 122/75, pulse 71, temperature 98 F (36.7 C), temperature source Oral, resp. rate 20, height 5\' 5"  (1.651 m), weight 84.369 kg (186 lb), SpO2 95.00%.  Lab Results: No results found for this or any previous visit (from the past 48 hour(s)).  Physical Findings: AIMS:  , ,  ,  ,    CIWA:  CIWA-Ar Total: 0  COWS:     Treatment Plan Summary: Daily contact with patient to assess and evaluate symptoms and progress in treatment Medication management  Plan: Some trouble with sleep, so will increase Trazodone Deanna Boehlke 10/16/2011, 7:26 PM

## 2011-10-16 NOTE — Progress Notes (Signed)
  Pt was  In the hallway at the time of this assessment. Pt was appropriate during assessment with fair eye contact. Pt slept through group, but had no c/o of SI/HI,AVH, or pain. Pt was concerned about sleep medication and was told of the Trazodone increase from 50-100 mg. Support was given, Q15 checks continue, pt remains safe on the unit.

## 2011-10-17 DIAGNOSIS — F1020 Alcohol dependence, uncomplicated: Secondary | ICD-10-CM | POA: Diagnosis present

## 2011-10-17 MED ORDER — HYDROCHLOROTHIAZIDE 12.5 MG PO CAPS: 12.5000 mg | ORAL_CAPSULE | Freq: Every day | ORAL | Status: DC

## 2011-10-17 MED ORDER — TRAZODONE HCL 100 MG PO TABS: 100.0000 mg | ORAL_TABLET | Freq: Every day | ORAL | Status: DC

## 2011-10-17 MED ORDER — THIAMINE HCL 100 MG PO TABS: 100.0000 mg | ORAL_TABLET | Freq: Every day | ORAL | Status: DC

## 2011-10-17 MED ORDER — THERA M PLUS PO TABS: 1.0000 | ORAL_TABLET | Freq: Every day | ORAL | Status: DC

## 2011-10-17 NOTE — Progress Notes (Signed)
Patient discharged to home today.  Reviewed all discharge instructions, medications, and follow up care.  Patient verbalized understanding of all.  All belongings returned.  Patient denies suicidal ideation at this time.  Suicide risk assessment done.  Patient was given a supply of medication from the hospital pharmacy.  He was also given a bus pass.  He left the unit ambulatory and was escorted to the front lobby.  Security was called to give him a ride to the bus stop.

## 2011-10-17 NOTE — Progress Notes (Signed)
Pt was having difficulty breathing.  Offered his prn Albuteral inhaler which he accepted.  Will continue to monitor for response.  Safety maintained with q15 minute  checks.

## 2011-10-17 NOTE — Progress Notes (Signed)
[facility] Group Notes:  (Counselor/Nursing/MHT/Case Management/Adjunct)  10/17/2011 4:54 PM  Type of Therapy:  Group Therapy  Participation Level:  Did Not Attend   Summary of Progress/Problems:   Purcell Nails 10/17/2011, 4:54 PM

## 2011-10-17 NOTE — Progress Notes (Signed)
Suicide Risk Assessment  Discharge Assessment     Demographic factors:    Current Mental Status:    Risk Reduction Factors:     CLINICAL FACTORS:   Alcohol/Substance Abuse/Dependencies Previous Psychiatric Diagnoses and Treatments Medical Diagnoses and Treatments/Surgeries  COGNITIVE FEATURES THAT CONTRIBUTE TO RISK:  Loss of executive function Thought constriction (tunnel vision)    SUICIDE RISK:   Minimal: No identifiable suicidal ideation.  Patients presenting with no risk factors but with morbid ruminations; may be classified as minimal risk based on the severity of the depressive symptoms  Patient denies suicidal or homicidal ideation, hallucinations, illusions, or delusions. Patient engages with good eye contact, is able to focus adequately in a one to one setting, and has clear goal directed thoughts. Patient speaks with a natural conversational volume, rate, and tone. Anxiety was reported at 1 on a scale of 1 the least and 10 the most. Depression was reported also at 1 on the same scale. Patient is oriented to name, city and state only not to building or date, recent memory intact. Judgement: poor Insight: limited Plan: Stop drinking alcohol, start going to [employer], get a sponsor  Zachary [name], Zachary [name] 10/17/2011, 2:23 PM

## 2011-10-17 NOTE — Progress Notes (Signed)
Interdisciplinary Treatment Plan Update (Adult)  Date:  10/17/2011 Time Reviewed:  7:58 AM  Progress in Treatment: Attending groups: Yes. and As evidenced by:  intermittently Participating in groups:  Yes. and As evidenced by:  when in attendence, and asked questions directly Taking medication as prescribed:  Yes. Tolerating medication:  Yes. Family/Significant othe contact made:  No, will contact:  none available Patient understands diagnosis:  Yes. and As evidenced by:  asking for help with detox, stopping drinking Discussing patient identified problems/goals with staff:  Yes. and As evidenced by:  but limited insight, judgement Medical problems stabilized or resolved:  Yes. Denies suicidal/homicidal ideation: Yes. and As evidenced by:  self inventory Issues/concerns per patient self-inventory:  Yes. and As evidenced by:  c/o pain Other:  New problem(s) identified: No, Describe:  none  Reason for Continuation of Hospitalization: Medication stabilization Withdrawal symptoms  Interventions implemented related to continuation of hospitalization: Librium detox protocol,  Encourage group attendance, participation  Additional comments:Possible d/c today.  Dr will see and determine plan.  Will get medical follow up appointment, talk to pt about sobriety plan and possible referrals.  Estimated length of stay: 1-2 days  Discharge Plan: return home New goal(s):  Review of initial/current patient goals per problem list:   1.  Goal(s):safely detox from alcohol  Met:  Yes  Target date:11/13  As evidenced ZO:[id number] CIWA score from current to 0  2.  Goal (s):Zachary [name] will identify a comprehensive sobriety plan  Met:  No  Target date:11/13  As evidenced by: referrals to helping agencies, groups, mtgs by d/c day  3.  Goal(s):  Met:  No  Target date:  As evidenced by:  4.  Goal(s):  Met:  No  Target date:  As evidenced by:  Attendees: Patient:   11/13/20127:58  AM  Family:   11/13/20127:58 AM  Physician:  Orson Aloe 11/13/20127:58 AM  Nursing:   Izola Price 11/13/20127:58 AM  Case Manager:  Richelle Ito 11/13/20127:58 AM  Counselor:  Vanetta Mulders 11/13/20127:58 AM  Other:   11/13/20127:58 AM  Other:   11/13/20127:58 AM  Other:   11/13/20127:58 AM  Other:   11/13/20127:58 AM   Scribe for Treatment Team:   Ida Rogue, 10/17/2011, 7:58 AM

## 2011-10-17 NOTE — Progress Notes (Signed)
[facility] Group Notes:  (Counselor/Nursing/MHT/Case Management/Adjunct)  10/17/2011 12:46 PM  Type of Therapy:  Group Therapy  Participation Level:  Did Not Attend     Purcell Nails 10/17/2011, 12:46 PM

## 2011-10-17 NOTE — Progress Notes (Signed)
Pt was awake and having difficulty breathing.  Offered prn Albuteral inhaler which pt accepted.   Will continue to monitor.  Safety maintained with q15 minute checks.

## 2011-10-17 NOTE — Progress Notes (Signed)
Recreation Therapy Group Note  Date: 10/17/2011         Time: 1000       Group Topic/Focus: Patient invited to participate in animal assisted therapy. Pets as a coping skill and responsibility were discussed.   Participation Level: Did Not Attend  Participation Quality: Not Applicable  Affect: Not Applicable  Cognitive: Not Applicable   Additional Comments: Patient declined pet therapy.  

## 2011-10-17 NOTE — Discharge Summary (Addendum)
Discharge Note  Date of Admission:  10/13/2011  Date of Discharge:  10/17/2011  Level of Care:  OP  Discharge destination:  Home  Is patient on multiple antipsychotic therapies at discharge:  No    Patient phone:  [phone] (home)  Patient address:   [address] [place] [address],   Follow-up recommendations:    Comments:    The patient received suicide prevention pamphlet:  No Belongings returned:  Judene Companion, Antaeus Karel 10/17/2011, 2:30 PM

## 2011-10-17 NOTE — Discharge Summary (Addendum)
Physician Discharge Summary  Patient ID: Zachary [name] MRN: [id number] DOB/AGE: 1941/08/24 70 y.o.  Admit date: 10/13/2011 Discharge date: 10/17/2011  Discharge Diagnoses:  Principal Problem:  *Alcohol dependence  Axis I #1 alcohol dependence Axis II deferred Axis III arterial hypertension congestive heart failure Axis IV moderate psychosocial issues related to substance use Axis V 45  Discharged Condition: good  Hospital Course:   Was voluntarily admitted and started on Librium protocol.  He did okay on that.  He got his nights and days mixed up and slept through most of his daytime groups.  He cooperated minimally with the case manger who attempted to call Ava a lady he stays with.  He was not willing to go to any rehab facility or treatment.  He came in for detox only and was content with just that and no additional treatment.  He denied that any doctor has ever told him that he is damaging his liver with his drinking.  He was referred to his primary care doctor for follow-up for his considerable medical problems.   Discharge Exam: Blood pressure 107/68, pulse 67, temperature 98.1 F (36.7 C), temperature source Oral, resp. rate 20, height 5\' 5"  (1.651 m), weight 84.369 kg (186 lb), SpO2 95.00%.  Patient denies suicidal or homicidal ideation, hallucinations, illusions, or delusions. Patient engages with good eye contact, is able to focus adequately in a one to one setting, and has clear goal directed thoughts. Patient speaks with a natural conversational volume, rate, and tone. Anxiety was reported at 1 on a scale of 1 the least and 10 the most. Depression was reported also at 1 on the same scale. Patient is oriented to name, city and state only not to building or date, recent memory intact. Judgement: poor Insight: limited Plan: Stop drinking alcohol, start going to [employer], get a sponsor   Disposition: Home  Discharge Orders    Future Orders Please Complete By  Expires   Diet - low sodium heart healthy        Current Discharge Medication List    START taking these medications   Details  hydrochlorothiazide (MICROZIDE) 12.5 MG capsule Take 1 capsule (12.5 mg total) by mouth daily. Qty: 30 capsule, Refills: 0    Multiple Vitamins-Minerals (MULTIVITAMINS THER. W/MINERALS) TABS Take 1 tablet by mouth daily. Qty: 30 each, Refills: 0    thiamine 100 MG tablet Take 1 tablet (100 mg total) by mouth daily. Qty: 30 tablet, Refills: 0      CONTINUE these medications which have CHANGED   Details  traZODone (DESYREL) 100 MG tablet Take 1 tablet (100 mg total) by mouth at bedtime. Qty: 30 tablet, Refills: 0      CONTINUE these medications which have NOT CHANGED   Details  allopurinol (ZYLOPRIM) 300 MG tablet Take 300 mg by mouth 2 (two) times daily.      celecoxib (CELEBREX) 200 MG capsule Take 200 mg by mouth 2 (two) times daily.      clopidogrel (PLAVIX) 75 MG tablet Take 75 mg by mouth daily.      dexlansoprazole (DEXILANT) 60 MG capsule Take 60 mg by mouth daily.      DULoxetine (CYMBALTA) 30 MG capsule Take 30 mg by mouth daily.      gabapentin (NEURONTIN) 300 MG capsule Take 300 mg by mouth 3 (three) times daily.      isosorbide mononitrate (IMDUR) 30 MG 24 hr tablet Take 30 mg by mouth daily.      metoprolol tartrate (  LOPRESSOR) 25 MG tablet Take 25 mg by mouth 2 (two) times daily.      montelukast (SINGULAIR) 10 MG tablet Take 10 mg by mouth at bedtime.      olmesartan-hydrochlorothiazide (BENICAR HCT) 20-12.5 MG per tablet Take 1 tablet by mouth daily.      potassium chloride (KLOR-CON) 10 MEQ CR tablet Take 10 mEq by mouth.      tiotropium (SPIRIVA) 18 MCG inhalation capsule Place 18 mcg into inhaler and inhale daily.        STOP taking these medications     colchicine 0.6 MG tablet        Follow-up Information    Follow up with BOUSKA,DAVID E on 10/20/2011. (1:15PM)    Contact information:   [address] [place] [address] [phone]          Signed: Orson Aloe 10/17/2011, 2:31 PM

## 2011-10-17 NOTE — Progress Notes (Signed)
Pt to d/c today.  Talked to [facility] with whom Ronnald has lived along with Logan's mother Ava and Logan's children.  Phone [phone].  She is concerned about his continued relapses, and states that he will not be allowed to stay with them if it continues.  He has not actively pursued a sobriety plan in the past.  Her mother is in medical rehab., but will be returning home soon.  She is not able to pick Valentino up as she does not have a car.  Pt given bus pass, made sure he knew how to get home.  Given scripts, meds, follow up with Dr Everlene Other.

## 2011-10-18 NOTE — Progress Notes (Signed)
Patient Discharge Instructions: No consent for Zachary [name], 10/18/2011, 2:00 PM

## 2011-11-02 ENCOUNTER — Encounter (INDEPENDENT_AMBULATORY_CARE_PROVIDER_SITE_OTHER): Payer: Self-pay | Admitting: Surgery

## 2011-11-09 ENCOUNTER — Emergency Department (HOSPITAL_COMMUNITY)
Admission: EM | Admit: 2011-11-09 | Discharge: 2011-11-10 | Disposition: A | Discharge status: Home / Self Care | Payer: Medicare Other | Attending: Emergency Medicine | Admitting: Emergency Medicine

## 2011-11-09 ENCOUNTER — Encounter (HOSPITAL_COMMUNITY): Payer: Self-pay

## 2011-11-09 DIAGNOSIS — I10 Essential (primary) hypertension: Secondary | ICD-10-CM | POA: Insufficient documentation

## 2011-11-09 DIAGNOSIS — M47812 Spondylosis without myelopathy or radiculopathy, cervical region: Secondary | ICD-10-CM | POA: Insufficient documentation

## 2011-11-09 DIAGNOSIS — I2510 Atherosclerotic heart disease of native coronary artery without angina pectoris: Secondary | ICD-10-CM | POA: Insufficient documentation

## 2011-11-09 DIAGNOSIS — E785 Hyperlipidemia, unspecified: Secondary | ICD-10-CM | POA: Insufficient documentation

## 2011-11-09 DIAGNOSIS — S2239XA Fracture of one rib, unspecified side, initial encounter for closed fracture: Secondary | ICD-10-CM | POA: Insufficient documentation

## 2011-11-09 DIAGNOSIS — Z79899 Other long term (current) drug therapy: Secondary | ICD-10-CM | POA: Insufficient documentation

## 2011-11-09 DIAGNOSIS — G319 Degenerative disease of nervous system, unspecified: Secondary | ICD-10-CM | POA: Insufficient documentation

## 2011-11-09 DIAGNOSIS — R079 Chest pain, unspecified: Secondary | ICD-10-CM | POA: Insufficient documentation

## 2011-11-09 DIAGNOSIS — W19XXXA Unspecified fall, initial encounter: Secondary | ICD-10-CM | POA: Insufficient documentation

## 2011-11-09 NOTE — ED Notes (Signed)
[id number]<AV> Expected date:<BR> Expected time:<BR> Means of arrival:<BR> Comments:<BR> Ems/fall/hip pain

## 2011-11-10 ENCOUNTER — Emergency Department (HOSPITAL_COMMUNITY): Payer: Medicare Other

## 2011-11-10 ENCOUNTER — Other Ambulatory Visit: Payer: Self-pay

## 2011-11-10 LAB — POCT I-STAT, CHEM 8
Chloride: 94 mEq/L — ABNORMAL LOW (ref 96–112)
Creatinine, Ser: 1.2 mg/dL (ref 0.50–1.35)
Glucose, Bld: 77 mg/dL (ref 70–99)
Potassium: 4.1 mEq/L (ref 3.5–5.1)

## 2011-11-10 LAB — CBC
HCT: 39.6 % (ref 39.0–52.0)
Hemoglobin: 13.6 g/dL (ref 13.0–17.0)
MCH: 31.9 pg (ref 26.0–34.0)
MCV: 93 fL (ref 78.0–100.0)
RBC: 4.26 MIL/uL (ref 4.22–5.81)

## 2011-11-10 MED ORDER — KETOROLAC TROMETHAMINE 60 MG/2ML IM SOLN
60.0000 mg | Freq: Once | INTRAMUSCULAR | Status: AC
  Administered 2011-11-10: 60 mg via INTRAMUSCULAR
  Filled 2011-11-10: qty 2

## 2011-11-10 MED ORDER — HYDROCODONE-ACETAMINOPHEN 5-500 MG PO TABS: 1.0000 | ORAL_TABLET | Freq: Four times a day (QID) | ORAL | Status: AC | PRN

## 2011-11-10 NOTE — ED Provider Notes (Signed)
History     CSN: [id number] Arrival date & time: 11/09/2011 11:51 PM   First MD Initiated Contact with Patient 11/10/11 0017      Chief Complaint  Patient presents with  . Fall  . Alcohol Intoxication    (Consider location/radiation/quality/duration/timing/severity/associated sxs/prior treatment) Patient is a 70 y.o. male presenting with fall and intoxication. The history is provided by the patient. No language interpreter was used.  Fall The accident occurred 3 to 5 hours ago. The fall occurred while walking. He fell from a height of 1 to 2 ft. He landed on dirt. There was no blood loss. The point of impact was the head (left ribs). Pain location: ribs. The pain is at a severity of 10/10. He was ambulatory at the scene. There was no entrapment after the fall. There was no drug use involved in the accident. There was alcohol use involved in the accident. Pertinent negatives include no visual change, no fever, no numbness, no abdominal pain, no bowel incontinence, no nausea, no vomiting, no hematuria, no headaches, no hearing loss, no loss of consciousness and no tingling. The symptoms are aggravated by activity and pressure on the injury. He has tried nothing for the symptoms. The treatment provided no relief.  Alcohol Intoxication Pertinent negatives include no chest pain, no abdominal pain, no headaches and no shortness of breath.  No SOB no DOE, no n/v/d.   Past Medical History  Diagnosis Date  . Hypertension   . Coronary artery disease   . Hyperlipemia   . No pertinent past medical history   . Angina   . Shortness of breath   . Mental disorder   . Arthritis   . COPD (chronic obstructive pulmonary disease)   . Hiatal hernia     Past Surgical History  Procedure Date  . Abdominal surgery     History reviewed. No pertinent family history.  History  Substance Use Topics  . Smoking status: Former Smoker -- 1.0 packs/day for 15 years    Types: Cigarettes    Quit date:  10/12/2010  . Smokeless tobacco: Not on file  . Alcohol Use: 7.2 oz/week    12 Cans of beer per week     pt heavy drinker      Review of Systems  Constitutional: Negative for fever.  HENT: Negative for facial swelling.   Eyes: Negative for discharge.  Respiratory: Negative for apnea, choking, chest tightness and shortness of breath.   Cardiovascular: Negative for chest pain.  Gastrointestinal: Negative for nausea, vomiting, abdominal pain, abdominal distention and bowel incontinence.  Genitourinary: Negative for hematuria and difficulty urinating.  Musculoskeletal: Negative for arthralgias.  Neurological: Negative for tingling, loss of consciousness, numbness and headaches.  Hematological: Negative.   Psychiatric/Behavioral: Negative.     Allergies  Review of patient's allergies indicates no known allergies.  Home Medications   Current Outpatient Rx  Name Route Sig Dispense Refill  . ALLOPURINOL 300 MG PO TABS Oral Take 300 mg by mouth 2 (two) times daily.      Marland Kitchen CLOPIDOGREL BISULFATE 75 MG PO TABS Oral Take 75 mg by mouth daily.      . DEXLANSOPRAZOLE 60 MG PO CPDR Oral Take 60 mg by mouth daily.      . DULOXETINE HCL 30 MG PO CPEP Oral Take 30 mg by mouth daily.      . ISOSORBIDE MONONITRATE ER 30 MG PO TB24 Oral Take 30 mg by mouth daily.      Marland Kitchen METOPROLOL TARTRATE  25 MG PO TABS Oral Take 25 mg by mouth 2 (two) times daily.      Marland Kitchen OLMESARTAN MEDOXOMIL-HCTZ 20-12.5 MG PO TABS Oral Take 1 tablet by mouth daily.      . TRAZODONE HCL 50 MG PO TABS Oral Take 50 mg by mouth at bedtime.      . CELECOXIB 200 MG PO CAPS Oral Take 200 mg by mouth 2 (two) times daily.      Marland Kitchen GABAPENTIN 300 MG PO CAPS Oral Take 300 mg by mouth 3 (three) times daily.      Marland Kitchen HYDROCODONE-ACETAMINOPHEN 5-500 MG PO TABS Oral Take 1 tablet by mouth every 6 (six) hours as needed for pain. 9 tablet 0  . MONTELUKAST SODIUM 10 MG PO TABS Oral Take 10 mg by mouth at bedtime.      Carma Leaven M PLUS PO TABS Oral  Take 1 tablet by mouth daily. 30 each 0    FOR nutritional recovery  . POTASSIUM CHLORIDE 10 MEQ PO TBCR Oral Take 10 mEq by mouth.      . THIAMINE HCL 100 MG PO TABS Oral Take 1 tablet (100 mg total) by mouth daily. 30 tablet 0    FOR nutritional recovery  . TIOTROPIUM BROMIDE MONOHYDRATE 18 MCG IN CAPS Inhalation Place 18 mcg into inhaler and inhale daily.        BP 138/68  Pulse 94  Temp(Src) 98.7 F (37.1 C) (Oral)  Resp 20  SpO2 94%  Physical Exam  Constitutional: He is oriented to person, place, and time. He appears well-developed and well-nourished. No distress.  HENT:  Head: Normocephalic and atraumatic.  Right Ear: No hemotympanum.  Left Ear: No hemotympanum.  Mouth/Throat: No oropharyngeal exudate.  Eyes: EOM are normal. Pupils are equal, round, and reactive to light. Right eye exhibits no discharge. Left eye exhibits no discharge.  Neck: No tracheal deviation present. No thyromegaly present.  Cardiovascular: Normal rate and regular rhythm.   Pulmonary/Chest: Effort normal and breath sounds normal. He has no wheezes. He exhibits tenderness.  Abdominal: Soft. Bowel sounds are normal. He exhibits no mass. There is no tenderness. There is no rebound and no guarding.  Musculoskeletal: Normal range of motion. He exhibits no edema.       No snuff box tenderness, negative anterior and posterior drawer tests of B knees  Neurological: He is alert and oriented to person, place, and time. He displays normal reflexes. No cranial nerve deficit.  Skin: Skin is warm and dry.  Psychiatric: Thought content normal.    ED Course  Procedures (including critical care time)  Labs Reviewed  CBC - Abnormal; Notable for the following:    WBC 12.3 (*)    All other components within normal limits  POCT I-STAT, CHEM 8 - Abnormal; Notable for the following:    Sodium 131 (*)    Chloride 94 (*)    BUN 5 (*)    Calcium, Ion 1.05 (*)    All other components within normal limits  POCT I-STAT  TROPONIN I  I-STAT, CHEM 8  I-STAT TROPONIN I   Dg Chest 2 View  11/10/2011  *RADIOLOGY REPORT*  Clinical Data: Status post fall; mid left chest pain.  CHEST - 2 VIEW  Comparison: Chest radiograph performed 09/03/2011  Findings: The lungs are well-aerated and clear.  There is no evidence of focal opacification, pleural effusion or pneumothorax. Mild new lateral pleural thickening underlying the left lateral sixth and seventh ribs raises question for an  underlying acute essentially nondisplaced rib fracture.  The heart is borderline normal in size; the mediastinal contour is within normal limits.  No definite osseous abnormalities are seen. Apparent lucency along the base of the left acromion is nonspecific and may be artifactual in nature.  IMPRESSION:  1.  Mild new lateral pleural thickening underlying the left lateral sixth and seventh ribs raises question for acute essentially nondisplaced rib fracture. 2.  No acute cardiopulmonary process seen.  Original Report Authenticated By: Tonia Ghent, M.D.   Ct Head Wo Contrast  11/10/2011  *RADIOLOGY REPORT*  Clinical Data:  Status post fall.  Possible intoxication.  CT HEAD WITHOUT CONTRAST CT CERVICAL SPINE WITHOUT CONTRAST  Technique:  Multidetector CT imaging of the head and cervical spine was performed following the standard protocol without intravenous contrast.  Multiplanar CT image reconstructions of the cervical spine were also generated.  Comparison:  04/26/2011 head and cervical spine CT.  CT HEAD  Findings: There is stable mild atrophy and mild chronic small vessel ischemic change.  No acute intracranial hemorrhage, mass lesion, brain edema or extra-axial fluid collection is identified. There is no evidence of cortical infarction.  The visualized paranasal sinuses remain clear.  The mastoids and middle ears are clear.  There are extensive intracranial vascular calcifications.  The calvarium is intact.  IMPRESSION: No acute intracranial or calvarial  findings.  CT CERVICAL SPINE  Findings: The cervical alignment is stable with mild straightening. There is no evidence of acute fracture or traumatic subluxation. Asymmetric facet disease on the right at C2-C3 appears stable. There is multilevel spondylosis with disc space loss and posterior osteophytes most advanced at C5-C6 and C6-C7.  No acute soft tissue findings are identified.  IMPRESSION:  1.  No evidence of acute cervical spine fracture, traumatic subluxation or static signs of instability. 2.  Stable spondylosis and alignment.  Original Report Authenticated By: Gerrianne Scale, M.D.   Ct Cervical Spine Wo Contrast  11/10/2011  *RADIOLOGY REPORT*  Clinical Data:  Status post fall.  Possible intoxication.  CT HEAD WITHOUT CONTRAST CT CERVICAL SPINE WITHOUT CONTRAST  Technique:  Multidetector CT imaging of the head and cervical spine was performed following the standard protocol without intravenous contrast.  Multiplanar CT image reconstructions of the cervical spine were also generated.  Comparison:  04/26/2011 head and cervical spine CT.  CT HEAD  Findings: There is stable mild atrophy and mild chronic small vessel ischemic change.  No acute intracranial hemorrhage, mass lesion, brain edema or extra-axial fluid collection is identified. There is no evidence of cortical infarction.  The visualized paranasal sinuses remain clear.  The mastoids and middle ears are clear.  There are extensive intracranial vascular calcifications.  The calvarium is intact.  IMPRESSION: No acute intracranial or calvarial findings.  CT CERVICAL SPINE  Findings: The cervical alignment is stable with mild straightening. There is no evidence of acute fracture or traumatic subluxation. Asymmetric facet disease on the right at C2-C3 appears stable. There is multilevel spondylosis with disc space loss and posterior osteophytes most advanced at C5-C6 and C6-C7.  No acute soft tissue findings are identified.  IMPRESSION:  1.  No  evidence of acute cervical spine fracture, traumatic subluxation or static signs of instability. 2.  Stable spondylosis and alignment.  Original Report Authenticated By: Gerrianne Scale, M.D.     1. Rib fracture   2. Fall       MDM   Date: 11/10/2011  Rate: 79  Rhythm: normal sinus rhythm  QRS Axis: normal  Intervals: normal  ST/T Wave abnormalities: nonspecific ST changes  Conduction Disutrbances:none  Narrative Interpretation:   Old EKG Reviewed: none available and unchanged   Return for chest pain, shortness or breath, nausea vomiting or any concerns.  Follow up with your family doctor       Wallie Lagrand K Miklos Bidinger-Rasch, MD 11/10/11 [phone]

## 2011-11-10 NOTE — ED Notes (Signed)
Unable to obtain signature prior to d/c.

## 2012-02-25 ENCOUNTER — Encounter (HOSPITAL_COMMUNITY): Payer: Self-pay

## 2012-02-25 ENCOUNTER — Encounter (HOSPITAL_COMMUNITY): Payer: Self-pay | Admitting: *Deleted

## 2012-02-25 ENCOUNTER — Encounter (HOSPITAL_COMMUNITY): Payer: Self-pay | Admitting: Licensed Clinical Social Worker

## 2012-02-25 ENCOUNTER — Emergency Department (HOSPITAL_COMMUNITY): Payer: 59

## 2012-02-25 ENCOUNTER — Inpatient Hospital Stay (HOSPITAL_COMMUNITY)
Admission: RE | Admit: 2012-02-25 | Discharge: 2012-03-06 | DRG: 897 | Disposition: A | Discharge status: Home / Self Care | Payer: 59 | Source: Ambulatory Visit | Attending: Psychiatry | Admitting: Psychiatry

## 2012-02-25 ENCOUNTER — Emergency Department (HOSPITAL_COMMUNITY)
Admission: EM | Admit: 2012-02-25 | Discharge: 2012-02-25 | Disposition: A | Discharge status: Other Acute Inpatient Hospital | Payer: 59 | Source: Home / Self Care | Attending: Emergency Medicine | Admitting: Emergency Medicine

## 2012-02-25 DIAGNOSIS — J449 Chronic obstructive pulmonary disease, unspecified: Secondary | ICD-10-CM | POA: Insufficient documentation

## 2012-02-25 DIAGNOSIS — R062 Wheezing: Secondary | ICD-10-CM | POA: Insufficient documentation

## 2012-02-25 DIAGNOSIS — R05 Cough: Secondary | ICD-10-CM | POA: Insufficient documentation

## 2012-02-25 DIAGNOSIS — I10 Essential (primary) hypertension: Secondary | ICD-10-CM

## 2012-02-25 DIAGNOSIS — F10229 Alcohol dependence with intoxication, unspecified: Secondary | ICD-10-CM

## 2012-02-25 DIAGNOSIS — M129 Arthropathy, unspecified: Secondary | ICD-10-CM | POA: Insufficient documentation

## 2012-02-25 DIAGNOSIS — I2510 Atherosclerotic heart disease of native coronary artery without angina pectoris: Secondary | ICD-10-CM

## 2012-02-25 DIAGNOSIS — M109 Gout, unspecified: Secondary | ICD-10-CM

## 2012-02-25 DIAGNOSIS — F329 Major depressive disorder, single episode, unspecified: Secondary | ICD-10-CM

## 2012-02-25 DIAGNOSIS — Z79899 Other long term (current) drug therapy: Secondary | ICD-10-CM

## 2012-02-25 DIAGNOSIS — J4489 Other specified chronic obstructive pulmonary disease: Secondary | ICD-10-CM | POA: Insufficient documentation

## 2012-02-25 DIAGNOSIS — F3289 Other specified depressive episodes: Secondary | ICD-10-CM

## 2012-02-25 DIAGNOSIS — F1020 Alcohol dependence, uncomplicated: Secondary | ICD-10-CM

## 2012-02-25 DIAGNOSIS — E785 Hyperlipidemia, unspecified: Secondary | ICD-10-CM | POA: Insufficient documentation

## 2012-02-25 DIAGNOSIS — R059 Cough, unspecified: Secondary | ICD-10-CM | POA: Insufficient documentation

## 2012-02-25 DIAGNOSIS — R4182 Altered mental status, unspecified: Secondary | ICD-10-CM | POA: Insufficient documentation

## 2012-02-25 HISTORY — DX: Major depressive disorder, single episode, unspecified: F32.9

## 2012-02-25 HISTORY — DX: Depression, unspecified: F32.A

## 2012-02-25 LAB — COMPREHENSIVE METABOLIC PANEL
Alkaline Phosphatase: 127 U/L — ABNORMAL HIGH (ref 39–117)
BUN: 3 mg/dL — ABNORMAL LOW (ref 6–23)
CO2: 25 mEq/L (ref 19–32)
Calcium: 9.2 mg/dL (ref 8.4–10.5)
GFR calc Af Amer: >90 mL/min (ref >90)
GFR calc non Af Amer: 87 mL/min — ABNORMAL LOW (ref >90)
Glucose, Bld: 91 mg/dL (ref 70–99)
Potassium: 3.4 mEq/L — ABNORMAL LOW (ref 3.5–5.1)
Total Protein: 7.6 g/dL (ref 6.0–8.3)

## 2012-02-25 LAB — CBC
HCT: 39.1 % (ref 39.0–52.0)
Hemoglobin: 13.6 g/dL (ref 13.0–17.0)
MCH: 33.3 pg (ref 26.0–34.0)
MCHC: 34.8 g/dL (ref 30.0–36.0)
RBC: 4.08 MIL/uL — ABNORMAL LOW (ref 4.22–5.81)

## 2012-02-25 LAB — RAPID URINE DRUG SCREEN, HOSP PERFORMED
Amphetamines: NOT DETECTED
Opiates: NOT DETECTED

## 2012-02-25 LAB — ETHANOL: Alcohol, Ethyl (B): 203 mg/dL — ABNORMAL HIGH (ref 0–11)

## 2012-02-25 MED ORDER — ALUM & MAG HYDROXIDE-SIMETH 200-200-20 MG/5ML PO SUSP
30.0000 mL | ORAL | Status: DC | PRN
  Administered 2012-02-28: 30 mL via ORAL

## 2012-02-25 MED ORDER — ALBUTEROL SULFATE (5 MG/ML) 0.5% IN NEBU
5.0000 mg | INHALATION_SOLUTION | Freq: Once | RESPIRATORY_TRACT | Status: AC
  Administered 2012-02-25: 2.5 mg via RESPIRATORY_TRACT
  Filled 2012-02-25: qty 1

## 2012-02-25 MED ORDER — IPRATROPIUM BROMIDE 0.02 % IN SOLN
RESPIRATORY_TRACT | Status: AC
  Administered 2012-02-25: 0.5 mg
  Filled 2012-02-25: qty 2.5

## 2012-02-25 MED ORDER — IPRATROPIUM BROMIDE 0.02 % IN SOLN
0.5000 mg | Freq: Once | RESPIRATORY_TRACT | Status: AC
  Administered 2012-02-25: 0.5 mg via RESPIRATORY_TRACT

## 2012-02-25 MED ORDER — CHLORDIAZEPOXIDE HCL 25 MG PO CAPS
25.0000 mg | ORAL_CAPSULE | Freq: Every day | ORAL | Status: AC
  Administered 2012-03-01: 25 mg via ORAL
  Filled 2012-02-25: qty 1

## 2012-02-25 MED ORDER — THIAMINE HCL 100 MG/ML IJ SOLN
100.0000 mg | Freq: Once | INTRAMUSCULAR | Status: AC
  Administered 2012-02-25: 100 mg via INTRAMUSCULAR

## 2012-02-25 MED ORDER — CHLORDIAZEPOXIDE HCL 25 MG PO CAPS
25.0000 mg | ORAL_CAPSULE | ORAL | Status: AC
  Administered 2012-02-28 – 2012-02-29 (×2): 25 mg via ORAL
  Filled 2012-02-25 (×2): qty 1

## 2012-02-25 MED ORDER — ADULT MULTIVITAMIN W/MINERALS CH
1.0000 | ORAL_TABLET | Freq: Every day | ORAL | Status: DC
  Administered 2012-02-26 – 2012-03-06 (×10): 1 via ORAL
  Filled 2012-02-25 (×13): qty 1

## 2012-02-25 MED ORDER — LOPERAMIDE HCL 2 MG PO CAPS: 2.0000 mg | ORAL_CAPSULE | ORAL | Status: AC | PRN

## 2012-02-25 MED ORDER — CHLORDIAZEPOXIDE HCL 25 MG PO CAPS
25.0000 mg | ORAL_CAPSULE | Freq: Three times a day (TID) | ORAL | Status: AC
  Administered 2012-02-27 – 2012-02-28 (×3): 25 mg via ORAL
  Filled 2012-02-25 (×3): qty 1

## 2012-02-25 MED ORDER — LORAZEPAM 1 MG PO TABS
1.0000 mg | ORAL_TABLET | Freq: Once | ORAL | Status: AC
  Administered 2012-02-25: 1 mg via ORAL
  Filled 2012-02-25: qty 1

## 2012-02-25 MED ORDER — MAGNESIUM HYDROXIDE 400 MG/5ML PO SUSP: 30.0000 mL | Freq: Every day | ORAL | Status: DC | PRN

## 2012-02-25 MED ORDER — CHLORDIAZEPOXIDE HCL 25 MG PO CAPS
25.0000 mg | ORAL_CAPSULE | Freq: Four times a day (QID) | ORAL | Status: AC
  Administered 2012-02-25 – 2012-02-27 (×6): 25 mg via ORAL
  Filled 2012-02-25 (×6): qty 1

## 2012-02-25 MED ORDER — HYDROXYZINE HCL 25 MG PO TABS: 25.0000 mg | ORAL_TABLET | Freq: Four times a day (QID) | ORAL | Status: AC | PRN

## 2012-02-25 MED ORDER — VITAMIN B-1 100 MG PO TABS
100.0000 mg | ORAL_TABLET | Freq: Every day | ORAL | Status: DC
  Administered 2012-02-26 – 2012-03-06 (×10): 100 mg via ORAL
  Filled 2012-02-25: qty 3
  Filled 2012-02-25 (×3): qty 1
  Filled 2012-02-25: qty 3
  Filled 2012-02-25 (×8): qty 1

## 2012-02-25 MED ORDER — CHLORDIAZEPOXIDE HCL 25 MG PO CAPS: 25.0000 mg | ORAL_CAPSULE | Freq: Four times a day (QID) | ORAL | Status: AC | PRN

## 2012-02-25 MED ORDER — ONDANSETRON 4 MG PO TBDP: 4.0000 mg | ORAL_TABLET | Freq: Four times a day (QID) | ORAL | Status: AC | PRN

## 2012-02-25 MED ORDER — ACETAMINOPHEN 325 MG PO TABS
650.0000 mg | ORAL_TABLET | Freq: Four times a day (QID) | ORAL | Status: DC | PRN
  Administered 2012-02-26 – 2012-03-05 (×3): 650 mg via ORAL
  Filled 2012-02-25: qty 2

## 2012-02-25 NOTE — Discharge Instructions (Signed)
Go to Behavioral Health. 

## 2012-02-25 NOTE — ED Notes (Signed)
Patient here for medical clearance and head CT and then can go to behavioral health for admission for detox from ETOH. Patient reports that his last intake was yesterday, has had frequent falls and blackouts related to heavy etoh use. Alert and oriented on arrival

## 2012-02-25 NOTE — ED Provider Notes (Signed)
History     CSN: [id number]  Arrival date & time 02/25/12  1454   First MD Initiated Contact with Patient 02/25/12 1510      Chief Complaint  Patient presents with  . Medical Clearance    (Consider location/radiation/quality/duration/timing/severity/associated sxs/prior treatment) HPI Comments: Patient presents for medical clearance for EtOH detox. Patient states he drinks four 40's a day. Last drink was yesterday. Patient has been seen by behavioral health and has a bed. Head CT requested due to patient reporting black outs. He has a history of COPD and is on breathing medications. Denies other medical complaints except for feeling "shaky".  Patient is a 70 y.o. male presenting with alcohol problem. The history is provided by the patient.  Alcohol Problem This is a chronic problem. Associated symptoms include coughing. Pertinent negatives include no abdominal pain, chest pain, congestion, fever, headaches, myalgias, nausea, rash, sore throat or vomiting. The symptoms are aggravated by nothing.    Past Medical History  Diagnosis Date  . Hypertension   . Coronary artery disease   . Hyperlipemia   . No pertinent past medical history   . Angina   . Shortness of breath   . Mental disorder   . Arthritis   . COPD (chronic obstructive pulmonary disease)   . Hiatal hernia   . Depression     Past Surgical History  Procedure Date  . Abdominal surgery     No family history on file.  History  Substance Use Topics  . Smoking status: Former Smoker -- 1.0 packs/day for 15 years    Types: Cigarettes    Quit date: 10/12/2010  . Smokeless tobacco: Not on file  . Alcohol Use: 7.2 oz/week    12 Cans of beer per week     pt heavy drinker      Review of Systems  Constitutional: Negative for fever.  HENT: Negative for congestion, sore throat and rhinorrhea.   Eyes: Negative for redness.  Respiratory: Positive for cough and wheezing. Negative for shortness of breath.     Cardiovascular: Negative for chest pain.  Gastrointestinal: Negative for nausea, vomiting, abdominal pain and diarrhea.  Genitourinary: Negative for dysuria.  Musculoskeletal: Negative for myalgias.  Skin: Negative for rash.  Neurological: Negative for headaches.    Allergies  Review of patient's allergies indicates no known allergies.  Home Medications   Current Outpatient Rx  Name Route Sig Dispense Refill  . ALLOPURINOL 300 MG PO TABS Oral Take 300 mg by mouth 2 (two) times daily.      . CELECOXIB 200 MG PO CAPS Oral Take 200 mg by mouth 2 (two) times daily.      Marland Kitchen CLOPIDOGREL BISULFATE 75 MG PO TABS Oral Take 75 mg by mouth daily.      . DEXLANSOPRAZOLE 60 MG PO CPDR Oral Take 60 mg by mouth daily.      . DULOXETINE HCL 30 MG PO CPEP Oral Take 30 mg by mouth daily.      Marland Kitchen GABAPENTIN 300 MG PO CAPS Oral Take 300 mg by mouth 3 (three) times daily.      . ISOSORBIDE MONONITRATE ER 30 MG PO TB24 Oral Take 30 mg by mouth daily.      Marland Kitchen METOPROLOL TARTRATE 25 MG PO TABS Oral Take 25 mg by mouth 2 (two) times daily.      Marland Kitchen MONTELUKAST SODIUM 10 MG PO TABS Oral Take 10 mg by mouth at bedtime.      Leonel Ramsay  PLUS PO TABS Oral Take 1 tablet by mouth daily. 30 each 0    FOR nutritional recovery  . OLMESARTAN MEDOXOMIL-HCTZ 20-12.5 MG PO TABS Oral Take 1 tablet by mouth daily.      Marland Kitchen POTASSIUM CHLORIDE 10 MEQ PO TBCR Oral Take 10 mEq by mouth.      . THIAMINE HCL 100 MG PO TABS Oral Take 1 tablet (100 mg total) by mouth daily. 30 tablet 0    FOR nutritional recovery  . TIOTROPIUM BROMIDE MONOHYDRATE 18 MCG IN CAPS Inhalation Place 18 mcg into inhaler and inhale daily.      . TRAZODONE HCL 50 MG PO TABS Oral Take 50 mg by mouth at bedtime.        BP 109/74  Pulse 102  Temp(Src) 98.8 F (37.1 C) (Oral)  Resp 20  SpO2 90%  Physical Exam  Nursing note and vitals reviewed. Constitutional: He is oriented to person, place, and time. He appears well-developed and well-nourished.  HENT:   Head: Normocephalic and atraumatic.  Eyes: Conjunctivae are normal. Right eye exhibits no discharge. Left eye exhibits no discharge.  Neck: Normal range of motion. Neck supple.  Cardiovascular: Normal rate, regular rhythm and normal heart sounds.   Pulmonary/Chest: Effort normal. No respiratory distress. He has wheezes. He has no rales.  Abdominal: Soft. There is no tenderness.  Musculoskeletal: He exhibits no edema and no tenderness.  Neurological: He is alert and oriented to person, place, and time. He has normal strength. No cranial nerve deficit or sensory deficit. GCS eye subscore is 4. GCS verbal subscore is 5. GCS motor subscore is 6.  Skin: Skin is warm and dry.  Psychiatric: He has a normal mood and affect.    ED Course  Procedures (including critical care time)  Labs Reviewed  CBC - Abnormal; Notable for the following:    RBC 4.08 (*)    RDW 16.6 (*)    All other components within normal limits  COMPREHENSIVE METABOLIC PANEL - Abnormal; Notable for the following:    Sodium 133 (*)    Potassium 3.4 (*)    Chloride 94 (*)    BUN 3 (*)    AST 100 (*) HEMOLYSIS AT THIS LEVEL MAY AFFECT RESULT   Alkaline Phosphatase 127 (*)    GFR calc non Af Amer 87 (*)    All other components within normal limits  ETHANOL - Abnormal; Notable for the following:    Alcohol, Ethyl (B) 203 (*)    All other components within normal limits  URINE RAPID DRUG SCREEN (HOSP PERFORMED)  URINE RAPID DRUG SCREEN (HOSP PERFORMED)   Ct Head Wo Contrast  02/25/2012  *RADIOLOGY REPORT*  Clinical Data: Multiple falls.  Altered mental status.  Medical clearing exam.  CT HEAD WITHOUT CONTRAST  Technique:  Contiguous axial images were obtained from the base of the skull through the vertex without contrast.  Comparison: Head CT scan 11/10/2011.  Findings: There is some cortical atrophy and chronic microvascular ischemic change.  No evidence of acute infarction, hemorrhage, mass lesion, mass effect, midline  shift or abnormal extra-axial fluid collection.  Very mild mucosal thickening left maxillary sinus is noted.  Calvarium intact.  No pneumocephalus or hydrocephalus.  IMPRESSION: No acute finding.  Atrophy and chronic microvascular ischemic change.  Original Report Authenticated By: Bernadene Bell. D'ALESSIO, M.D.     1. Alcohol dependence     3:28 PM Patient seen and examined. Work-up initiated. Medications ordered.   Vital signs reviewed and  are as follows: Filed Vitals:   02/25/12 1510  BP: 109/74  Pulse: 102  Temp: 98.8 F (37.1 C)  Resp: 20   Will give breathing treatment due to wheezing. Work-up pending.   4:00 PM Discussed with Dr. Lynelle Doctor. Work-up pending.   7:37 PM Dr. Lynelle Doctor has seen. First urine sample was mislabeled.   7:45 PM Patient cleared for transfer. Pt seen at time of discharge. He appears well. Breathing normally.   MDM  To [facility] for EtOH detox.         Renne Crigler, [place] 02/25/12 1946

## 2012-02-25 NOTE — BH Assessment (Signed)
Assessment Note   Zachary [name] is an 71 y.o. male, widowed who presents with friends to [facility] requesting alcohol detox. Pt reports a long history of alcohol dependence and was last detoxed at [facility] 10/2011. He reports he was sober approximately 3 weeks and then relapsed. He states he is seeking treatment at this time because he has been having blackouts and frequent falls. He pointed out a wound on the bridge of his nose where he recently fell. He states he has been drinking approximately four 40 oz daily. He reports his last drink was 02/25/2012 but his breathalyzer reads 0.133. He reports recent symptoms related to alcohol dependence including blackouts, nausea, vomiting, diarrhea and tremors. He states he cannot sleep without alcohol. He denies any other substance abuse saying "I stay off the streets." He reports depressive symptoms including feeling of sadness, social isolation, fatigue and guilt. He denies any past or current suicidal ideation, homicidal ideation or psychotic symptoms.   Pt is currently in a wheel chair due to being unsteady on his feet. He states he can ambulate and perform ADLs independently. He appears disheveled. He is currently intoxicated and was able to answer questions appropriately however he hesitated with most responses. He has multiple medical problems and prescription medications but admits he doesn't take his medications as prescribed.  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 Hypertension   . Coronary artery disease   . Hyperlipemia   . No pertinent past medical history   . Angina   . Shortness of breath   . Mental disorder   . Arthritis   . COPD (chronic obstructive pulmonary disease)   . Hiatal hernia   . Depression    Axis IV: problems with access to health care services and problems with primary support group Axis V: GAF=35  Past Medical History:    Past Medical History  Diagnosis Date  . Hypertension   . Coronary artery disease   . Hyperlipemia   . No pertinent past medical history   . Angina   . Shortness of breath   . Mental disorder   . Arthritis   . COPD (chronic obstructive pulmonary disease)   . Hiatal hernia   . Depression     Past Surgical History  Procedure Date  . Abdominal surgery     Family History: No family history on file.  Social History:  reports that he quit smoking about 16 months ago. His smoking use included Cigarettes. He has a 15 pack-year smoking history. He does not have any smokeless tobacco history on file. He reports that he drinks about 7.2 ounces of alcohol per week. He reports that he does not use illicit drugs.  Additional Social History:  Alcohol / Drug Use Pain Medications: Denies Prescriptions: Denies Over the Counter: Denies History of alcohol / drug use?: Yes Substance #1 Name of Substance 1: Alcohol 1 - Age of First Use: 18 1 - Amount (size/oz): Approximately four 40 oz beers 1 - Frequency: Daily 1 - Duration: 3 month 1 - Last Use / Amount: Two 40 oz beers Allergies: No Known Allergies  Home Medications:  No current facility-administered medications on file as of 02/25/2012.   Medications Prior to Admission  Medication Sig Dispense Refill  . allopurinol (ZYLOPRIM) 300 MG tablet Take 300 mg by mouth 2 (two) times daily.        . celecoxib (CELEBREX) 200  MG capsule Take 200 mg by mouth 2 (two) times daily.        . clopidogrel (PLAVIX) 75 MG tablet Take 75 mg by mouth daily.        Marland Kitchen dexlansoprazole (DEXILANT) 60 MG capsule Take 60 mg by mouth daily.        . DULoxetine (CYMBALTA) 30 MG capsule Take 30 mg by mouth daily.        Marland Kitchen gabapentin (NEURONTIN) 300 MG capsule Take 300 mg by mouth 3 (three) times daily.        . isosorbide mononitrate (IMDUR) 30 MG 24 hr tablet Take 30 mg by mouth daily.        . metoprolol tartrate (LOPRESSOR) 25 MG tablet Take 25 mg by mouth 2 (two)  times daily.        . montelukast (SINGULAIR) 10 MG tablet Take 10 mg by mouth at bedtime.        . Multiple Vitamins-Minerals (MULTIVITAMINS THER. W/MINERALS) TABS Take 1 tablet by mouth daily.  30 each  0  . olmesartan-hydrochlorothiazide (BENICAR HCT) 20-12.5 MG per tablet Take 1 tablet by mouth daily.        . potassium chloride (KLOR-CON) 10 MEQ CR tablet Take 10 mEq by mouth.        . thiamine 100 MG tablet Take 1 tablet (100 mg total) by mouth daily.  30 tablet  0  . tiotropium (SPIRIVA) 18 MCG inhalation capsule Place 18 mcg into inhaler and inhale daily.        . traZODone (DESYREL) 50 MG tablet Take 50 mg by mouth at bedtime.          OB/GYN Status:  No LMP for male patient.  General Assessment Data Location of Assessment: [facility] Assessment Services Living Arrangements: Friends Can pt return to current living arrangement?: Yes Admission Status: Voluntary Is patient capable of signing voluntary admission?: Yes Transfer from: Home Referral Source: Self/Family/Friend  Education Status Is patient currently in school?: No  Risk to self Suicidal Ideation: No Suicidal Intent: No Is patient at risk for suicide?: No Suicidal Plan?: No Access to Means: No What has been your use of drugs/alcohol within the last 12 months?: Pt has been drinking heavily on daily basis for 3 months Previous Attempts/Gestures: No How many times?: 0  Other Self Harm Risks: None Triggers for Past Attempts: None known Intentional Self Injurious Behavior: None Family Suicide History: No Recent stressful life event(s): Recent negative physical changes Persecutory voices/beliefs?: No Depression: Yes Depression Symptoms: Despondent;Isolating;Fatigue;Guilt;Loss of interest in usual pleasures Substance abuse history and/or treatment for substance abuse?: Yes Suicide prevention information given to non-admitted patients: Not applicable  Risk to Others Homicidal Ideation: No Thoughts of Harm to Others:  No Current Homicidal Intent: No Current Homicidal Plan: No Access to Homicidal Means: No Identified Victim: None History of harm to others?: No Assessment of Violence: None Noted Violent Behavior Description: Pt denies violent behavior Does patient have access to weapons?: No Criminal Charges Pending?: No Does patient have a court date: No  Psychosis Hallucinations: None noted Delusions: None noted  Mental Status Report Appear/Hygiene: Disheveled;Layered clothes Eye Contact: Fair Motor Activity: Unsteady Speech: Logical/coherent;Soft Level of Consciousness: Alert;Other (Comment) (Intoxicated) Mood: Depressed Affect: Appropriate to circumstance Anxiety Level: None Thought Processes: Coherent;Relevant Judgement: Unimpaired Orientation: Person;Place;Time;Situation Obsessive Compulsive Thoughts/Behaviors: None  Cognitive Functioning Concentration: Decreased Memory: Remote Intact;Recent Intact IQ: Average Insight: Fair Impulse Control: Good Appetite: Fair Weight Loss: 0  Weight Gain: 0  Sleep: Decreased (Uses  alcohol to sleep) Total Hours of Sleep: 7  Vegetative Symptoms: Decreased grooming  Prior Inpatient Therapy Prior Inpatient Therapy: Yes Prior Therapy Dates: 10/2011, 2008, 2002 Prior Therapy Facilty/Provider(s): [facility], [facility], [facility] Reason for Treatment: Alcohol detox  Prior Outpatient Therapy Prior Outpatient Therapy: No Prior Therapy Dates: None Prior Therapy Facilty/Provider(s): None Reason for Treatment: None  ADL Screening (condition at time of admission) Patient's cognitive ability adequate to safely complete daily activities?: Yes Patient able to express need for assistance with ADLs?: Yes Independently performs ADLs?: Yes Weakness of Legs: Both Weakness of Arms/Hands: None  Home Assistive Devices/Equipment Home Assistive Devices/Equipment: None    Abuse/Neglect Assessment (Assessment to be complete while patient is alone) Physical Abuse:  Denies Verbal Abuse: Denies Sexual Abuse: Denies Exploitation of patient/patient's resources: Denies Self-Neglect: Denies     Merchant navy officer (For Healthcare) Advance Directive: Patient does not have advance directive;Patient would not like information Pre-existing out of facility DNR order (yellow form or pink MOST form): No Nutrition Screen Diet: Regular Unintentional weight loss greater than 10lbs within the last month: No Problems chewing or swallowing foods and/or liquids: No Home Tube Feeding or Total Parenteral Nutrition (TPN): No Patient appears severely malnourished: No  Additional Information 1:1 In Past 12 Months?: No CIRT Risk: No Elopement Risk: No Does patient have medical clearance?: No     Disposition:  Disposition Disposition of Patient: Inpatient treatment program Type of inpatient treatment program: Adult  On Site Evaluation by:   Reviewed with Physician:  Mervyn Gay, MD  Consulted with Dr. Elsie Saas who accepted Pt to [facility] pending medical clearance. Dr. Elsie Saas also requests a head CT due to Pt's frequent and recent falls and blackouts. Consulted with Jacquelyne Balint, [facility] who confirmed bed space: 304-1. Discussed treatment recommendation with Pt who agrees to transfer to [facility] for medical clearance and then admission to [facility]. Pricilla Handler, RN at [employer] [employer] and gave report. Pt transported to [facility] by security and [facility] staff.   Patsy Baltimore, Harlin Rain 02/25/2012 2:56 PM

## 2012-02-25 NOTE — ED Notes (Signed)
Patient changed into scrubs and labs ordered, security to wand

## 2012-02-25 NOTE — Tx Team (Signed)
Initial Interdisciplinary Treatment Plan  PATIENT STRENGTHS: (choose at least two) Active sense of humor General fund of knowledge Work skills  PATIENT STRESSORS: Health problems Substance abuse   PROBLEM LIST: Problem List/Patient Goals Date to be addressed Date deferred Reason deferred Estimated date of resolution  ETOH Abuse 02/25/12                                                      DISCHARGE CRITERIA:  Ability to meet basic life and health needs Withdrawal symptoms are absent or subacute and managed without 24-hour nursing intervention  PRELIMINARY DISCHARGE PLAN: Attend 12-step recovery group Return to previous living arrangement  PATIENT/FAMIILY INVOLVEMENT: This treatment plan has been presented to and reviewed with the patient, Zachary [name], Zachary [name] 02/25/2012, 9:48 PM

## 2012-02-25 NOTE — ED Provider Notes (Signed)
Patient has a long history of alcoholism. He relates he has been sober as long as 10 years. He relates last time he was in detox/tear and he was sober couple months. Patient looks a little ill contacts that he is alert and cooperative. He also abrasion on the bridge of his nose which he states is from a fall.  Medical screening examination/treatment/procedure(s) were conducted as a shared visit with non-physician practitioner(s) and myself.  I personally evaluated the patient during the encounter Devoria Albe, MD, Franz Dell, MD 02/25/12 [phone]

## 2012-02-25 NOTE — Progress Notes (Signed)
Patient ID: Zachary [name], male   DOB: 1941/03/14, 71 y.o.   MRN: [id number] This is a 71 y.o. widowed/male with a Dx of ETOH Dependence. The pt. presented as a walk-in to [facility] looking for ETOH detox. The pt. Reports he was here 10/2011 for detox and relapsed three weeks after discharge. He states he is drinking up to four 40 ounce beers daily. He is having blackouts and falls frequently. Denies any other substance abuse. Denies any A/V hallucinations.Presents with a depressed mood and affect, but denies suicidal ideation. States he is tired of being drunk and wants to get sober. His appearance is disheveled and due to tremors is unstable on his feet. Medical history includes HTN, COPD, CAD, Hyperlipidemia, and Gout. CIWA on admission is an 8.

## 2012-02-26 DIAGNOSIS — F10229 Alcohol dependence with intoxication, unspecified: Secondary | ICD-10-CM

## 2012-02-26 LAB — URINALYSIS, ROUTINE W REFLEX MICROSCOPIC
Bilirubin Urine: NEGATIVE
Nitrite: NEGATIVE
Protein, ur: NEGATIVE mg/dL
Specific Gravity, Urine: 1.018 (ref 1.005–1.030)
Urobilinogen, UA: 1 mg/dL (ref 0.0–1.0)

## 2012-02-26 MED ORDER — POTASSIUM CHLORIDE CRYS ER 10 MEQ PO TBCR: 10.0000 meq | EXTENDED_RELEASE_TABLET | Freq: Every day | ORAL | Status: DC

## 2012-02-26 MED ORDER — OLMESARTAN MEDOXOMIL-HCTZ 20-12.5 MG PO TABS: 1.0000 | ORAL_TABLET | Freq: Every day | ORAL | Status: DC

## 2012-02-26 MED ORDER — DULOXETINE HCL 30 MG PO CPEP
30.0000 mg | ORAL_CAPSULE | Freq: Every day | ORAL | Status: DC
  Administered 2012-02-26 – 2012-03-06 (×10): 30 mg via ORAL
  Filled 2012-02-26 (×9): qty 1
  Filled 2012-02-26 (×2): qty 7
  Filled 2012-02-26 (×2): qty 1

## 2012-02-26 MED ORDER — POTASSIUM CHLORIDE CRYS ER 10 MEQ PO TBCR
10.0000 meq | EXTENDED_RELEASE_TABLET | Freq: Two times a day (BID) | ORAL | Status: DC
  Administered 2012-02-26 – 2012-03-06 (×19): 10 meq via ORAL
  Filled 2012-02-26 (×6): qty 1
  Filled 2012-02-26: qty 6
  Filled 2012-02-26: qty 1
  Filled 2012-02-26: qty 6
  Filled 2012-02-26 (×2): qty 1
  Filled 2012-02-26: qty 6
  Filled 2012-02-26 (×2): qty 1
  Filled 2012-02-26: qty 6
  Filled 2012-02-26 (×10): qty 1

## 2012-02-26 MED ORDER — PANTOPRAZOLE SODIUM 40 MG PO TBEC
40.0000 mg | DELAYED_RELEASE_TABLET | Freq: Every day | ORAL | Status: DC
  Administered 2012-02-26 – 2012-02-29 (×4): 40 mg via ORAL
  Filled 2012-02-26 (×6): qty 1

## 2012-02-26 MED ORDER — GABAPENTIN 300 MG PO CAPS
300.0000 mg | ORAL_CAPSULE | Freq: Three times a day (TID) | ORAL | Status: DC
  Administered 2012-02-26 – 2012-03-06 (×27): 300 mg via ORAL
  Filled 2012-02-26: qty 1
  Filled 2012-02-26: qty 9
  Filled 2012-02-26 (×5): qty 1
  Filled 2012-02-26: qty 9
  Filled 2012-02-26 (×9): qty 1
  Filled 2012-02-26: qty 9
  Filled 2012-02-26 (×2): qty 1
  Filled 2012-02-26: qty 9
  Filled 2012-02-26 (×3): qty 1
  Filled 2012-02-26: qty 9
  Filled 2012-02-26 (×10): qty 1
  Filled 2012-02-26: qty 9

## 2012-02-26 MED ORDER — TIOTROPIUM BROMIDE MONOHYDRATE 18 MCG IN CAPS: 18.0000 ug | ORAL_CAPSULE | Freq: Every day | RESPIRATORY_TRACT | Status: DC

## 2012-02-26 MED ORDER — ALLOPURINOL 300 MG PO TABS: 300.0000 mg | ORAL_TABLET | Freq: Two times a day (BID) | ORAL | Status: DC

## 2012-02-26 MED ORDER — CLOPIDOGREL BISULFATE 75 MG PO TABS: 75.0000 mg | ORAL_TABLET | Freq: Every day | ORAL | Status: DC

## 2012-02-26 MED ORDER — ISOSORBIDE MONONITRATE ER 30 MG PO TB24
30.0000 mg | ORAL_TABLET | Freq: Every day | ORAL | Status: DC
  Administered 2012-02-26 – 2012-03-06 (×10): 30 mg via ORAL
  Filled 2012-02-26: qty 3
  Filled 2012-02-26 (×7): qty 1
  Filled 2012-02-26: qty 3
  Filled 2012-02-26 (×4): qty 1

## 2012-02-26 MED ORDER — METOPROLOL TARTRATE 25 MG PO TABS: 25.0000 mg | ORAL_TABLET | Freq: Two times a day (BID) | ORAL | Status: DC

## 2012-02-26 MED ORDER — METOPROLOL TARTRATE 25 MG PO TABS
25.0000 mg | ORAL_TABLET | Freq: Two times a day (BID) | ORAL | Status: DC
  Administered 2012-02-26 – 2012-03-06 (×19): 25 mg via ORAL
  Filled 2012-02-26 (×6): qty 1
  Filled 2012-02-26: qty 6
  Filled 2012-02-26 (×6): qty 1
  Filled 2012-02-26: qty 6
  Filled 2012-02-26 (×4): qty 1
  Filled 2012-02-26: qty 6
  Filled 2012-02-26 (×4): qty 1
  Filled 2012-02-26: qty 6
  Filled 2012-02-26 (×2): qty 1

## 2012-02-26 MED ORDER — COLCHICINE 0.6 MG PO TABS
0.6000 mg | ORAL_TABLET | Freq: Every day | ORAL | Status: DC
  Administered 2012-02-26 – 2012-03-06 (×10): 0.6 mg via ORAL
  Filled 2012-02-26 (×2): qty 1
  Filled 2012-02-26 (×2): qty 3
  Filled 2012-02-26 (×10): qty 1

## 2012-02-26 MED ORDER — GABAPENTIN 300 MG PO CAPS: 300.0000 mg | ORAL_CAPSULE | Freq: Three times a day (TID) | ORAL | Status: DC

## 2012-02-26 MED ORDER — CELECOXIB 100 MG PO CAPS
200.0000 mg | ORAL_CAPSULE | Freq: Two times a day (BID) | ORAL | Status: DC
  Administered 2012-02-26 – 2012-03-06 (×19): 200 mg via ORAL
  Filled 2012-02-26 (×14): qty 1
  Filled 2012-02-26: qty 12
  Filled 2012-02-26 (×3): qty 1
  Filled 2012-02-26: qty 12
  Filled 2012-02-26: qty 1
  Filled 2012-02-26: qty 12
  Filled 2012-02-26 (×3): qty 1
  Filled 2012-02-26: qty 12

## 2012-02-26 MED ORDER — ALLOPURINOL 300 MG PO TABS
300.0000 mg | ORAL_TABLET | Freq: Two times a day (BID) | ORAL | Status: DC
  Administered 2012-02-26 – 2012-03-06 (×19): 300 mg via ORAL
  Filled 2012-02-26 (×4): qty 1
  Filled 2012-02-26 (×2): qty 6
  Filled 2012-02-26: qty 1
  Filled 2012-02-26: qty 6
  Filled 2012-02-26 (×6): qty 1
  Filled 2012-02-26: qty 6
  Filled 2012-02-26 (×10): qty 1

## 2012-02-26 MED ORDER — CLOPIDOGREL BISULFATE 75 MG PO TABS
75.0000 mg | ORAL_TABLET | Freq: Every day | ORAL | Status: DC
  Administered 2012-02-27 – 2012-03-06 (×9): 75 mg via ORAL
  Filled 2012-02-26: qty 1
  Filled 2012-02-26: qty 3
  Filled 2012-02-26 (×7): qty 1
  Filled 2012-02-26: qty 3
  Filled 2012-02-26 (×3): qty 1

## 2012-02-26 MED ORDER — HYDROCHLOROTHIAZIDE 12.5 MG PO CAPS
12.5000 mg | ORAL_CAPSULE | Freq: Every day | ORAL | Status: DC
  Administered 2012-02-26 – 2012-03-06 (×10): 12.5 mg via ORAL
  Filled 2012-02-26: qty 1
  Filled 2012-02-26: qty 3
  Filled 2012-02-26 (×2): qty 1
  Filled 2012-02-26: qty 3
  Filled 2012-02-26 (×8): qty 1

## 2012-02-26 MED ORDER — DULOXETINE HCL 30 MG PO CPEP: 30.0000 mg | ORAL_CAPSULE | Freq: Every day | ORAL | Status: DC

## 2012-02-26 MED ORDER — CELECOXIB 200 MG PO CAPS: 200.0000 mg | ORAL_CAPSULE | Freq: Two times a day (BID) | ORAL | Status: DC

## 2012-02-26 MED ORDER — MONTELUKAST SODIUM 10 MG PO TABS
10.0000 mg | ORAL_TABLET | Freq: Every day | ORAL | Status: DC
  Administered 2012-02-26 – 2012-03-05 (×9): 10 mg via ORAL
  Filled 2012-02-26 (×4): qty 1
  Filled 2012-02-26 (×2): qty 3
  Filled 2012-02-26 (×7): qty 1

## 2012-02-26 MED ORDER — VITAMIN B-1 100 MG PO TABS: 100.0000 mg | ORAL_TABLET | Freq: Every day | ORAL | Status: DC

## 2012-02-26 MED ORDER — ISOSORBIDE MONONITRATE ER 30 MG PO TB24: 30.0000 mg | ORAL_TABLET | Freq: Every day | ORAL | Status: DC

## 2012-02-26 MED ORDER — TRAZODONE HCL 50 MG PO TABS
50.0000 mg | ORAL_TABLET | Freq: Every day | ORAL | Status: DC
  Administered 2012-02-26 – 2012-03-05 (×9): 50 mg via ORAL
  Filled 2012-02-26 (×3): qty 1
  Filled 2012-02-26: qty 3
  Filled 2012-02-26 (×6): qty 1
  Filled 2012-02-26: qty 3
  Filled 2012-02-26 (×2): qty 1

## 2012-02-26 MED ORDER — TIOTROPIUM BROMIDE MONOHYDRATE 18 MCG IN CAPS
18.0000 ug | ORAL_CAPSULE | Freq: Every day | RESPIRATORY_TRACT | Status: DC
  Administered 2012-02-26 – 2012-03-06 (×10): 18 ug via RESPIRATORY_TRACT
  Filled 2012-02-26: qty 5

## 2012-02-26 NOTE — Progress Notes (Signed)
Patient's self inventory sheet, has poor sleep, poor appetite, low energy level, poor attention span.  Rated depression and hopelessness #10.  Has experienced tremors.  Denied SI, contracts for safety.   Has experienced pain, dizziness, headaches in past 24 hours.  Zero pain goal.  Worst pain #9.  No pain goal.  Worst pain #9.  No questions for staff.   Does not know ways to better take care of himself.  Plans to live in apartment with friends after discharge.  No problems taking meds after discharge. Patient has been using wheelchair to ambulate in hallway today.

## 2012-02-26 NOTE — [facility] Suicide Risk Assessment (Signed)
Suicide Risk Assessment  Admission Assessment     Demographic factors:  Assessment Details Time of Assessment: Admission Information Obtained From: Patient Current Mental Status:    Loss Factors:  Loss Factors: Decline in physical health Historical Factors:    Risk Reduction Factors:  Risk Reduction Factors: Living with another person, especially a relative  CLINICAL FACTORS:   Depression:   Comorbid alcohol abuse/dependence Alcohol/Substance Abuse/Dependencies Chronic Pain Unstable or Poor Therapeutic Relationship Previous Psychiatric Diagnoses and Treatments  COGNITIVE FEATURES THAT CONTRIBUTE TO RISK:  Closed-mindedness Thought constriction (tunnel vision)    SUICIDE RISK:   Moderate:  Frequent suicidal ideation with limited intensity, and duration, some specificity in terms of plans, no associated intent, good self-control, limited dysphoria/symptomatology, some risk factors present, and identifiable protective factors, including available and accessible social support.  Reason for hospitalization: .Passed out too many times form drinking  Diagnosis:  Axis I: [place] Dependence with acute intoxication  ADL's:  Impaired  Sleep: Poor  Appetite:  Good  Suicidal Ideation:  Pr denies any suicidal thoughts. Homicidal Ideation:  Denies adamantly any homicidal thoughts.  Mental Status Examination/Evaluation: Objective:  Appearance: Disheveled  Eye Contact::  Fair  Speech:  Clear and Coherent  Volume:  Increased  Mood:  7 /10 on a scale of 1 is the best and 10 is the worst  Anxiety: low/10 on the same scale  Affect:  Congruent  Thought Process:  Coherent  Orientation:  Other:  Disoriented to date and place.  Thought Content:  WDL  Suicidal Thoughts:  No  Homicidal Thoughts:  No  Memory:  Immediate;   Poor  Judgement:  Impaired  Insight:  Lacking  Psychomotor Activity:  Normal  Concentration:  Fair  Recall:  Fair  Akathisia:  No  Handed:  Right  AIMS (if  indicated):     Assets:  Communication Skills Desire for Improvement Financial Resources/Insurance Housing Resilience  Sleep:       Vital Signs: Blood pressure 142/98, pulse 100, temperature 98.6 F (37 C), resp. rate 16, height 5\' 3"  (1.6 m), weight 65.318 kg (144 lb), SpO2 98.00%. Current Medications:  Current Facility-Administered Medications  Medication Dose Route Frequency Provider Last Rate Last Dose  . acetaminophen (TYLENOL) tablet 650 mg  650 mg Oral Q6H PRN Verne Spurr, PA-C      . allopurinol (ZYLOPRIM) tablet 300 mg  300 mg Oral BID Sanjuana Kava, NP   300 mg at 02/26/12 1146  . alum & mag hydroxide-simeth (MAALOX/MYLANTA) 200-200-20 MG/5ML suspension 30 mL  30 mL Oral Q4H PRN Verne Spurr, PA-C      . celecoxib (CELEBREX) capsule 200 mg  200 mg Oral BID Sanjuana Kava, NP   200 mg at 02/26/12 1145  . chlordiazePOXIDE (LIBRIUM) capsule 25 mg  25 mg Oral Q6H PRN Verne Spurr, PA-C      . chlordiazePOXIDE (LIBRIUM) capsule 25 mg  25 mg Oral QID Verne Spurr, PA-C   25 mg at 02/26/12 1145   Followed by  . chlordiazePOXIDE (LIBRIUM) capsule 25 mg  25 mg Oral TID Verne Spurr, PA-C       Followed by  . chlordiazePOXIDE (LIBRIUM) capsule 25 mg  25 mg Oral BH-qamhs Verne Spurr, PA-C       Followed by  . chlordiazePOXIDE (LIBRIUM) capsule 25 mg  25 mg Oral Daily Verne Spurr, PA-C      . clopidogrel (PLAVIX) tablet 75 mg  75 mg Oral Q breakfast Sanjuana Kava, NP      .  DULoxetine (CYMBALTA) DR capsule 30 mg  30 mg Oral Daily Sanjuana Kava, NP   30 mg at 02/26/12 1144  . gabapentin (NEURONTIN) capsule 300 mg  300 mg Oral TID Sanjuana Kava, NP   300 mg at 02/26/12 1144  . hydrochlorothiazide (MICROZIDE) capsule 12.5 mg  12.5 mg Oral Daily Sanjuana Kava, NP   12.5 mg at 02/26/12 1144  . hydrOXYzine (ATARAX/VISTARIL) tablet 25 mg  25 mg Oral Q6H PRN Verne Spurr, PA-C      . isosorbide mononitrate (IMDUR) 24 hr tablet 30 mg  30 mg Oral Daily Sanjuana Kava, NP   30 mg at  02/26/12 1143  . loperamide (IMODIUM) capsule 2-4 mg  2-4 mg Oral PRN Verne Spurr, PA-C      . magnesium hydroxide (MILK OF MAGNESIA) suspension 30 mL  30 mL Oral Daily PRN Verne Spurr, PA-C      . metoprolol tartrate (LOPRESSOR) tablet 25 mg  25 mg Oral BID Sanjuana Kava, NP   25 mg at 02/26/12 1143  . mulitivitamin with minerals tablet 1 tablet  1 tablet Oral Daily Verne Spurr, PA-C   1 tablet at 02/26/12 [phone]  . ondansetron (ZOFRAN-ODT) disintegrating tablet 4 mg  4 mg Oral Q6H PRN Verne Spurr, PA-C      . potassium chloride (K-DUR,KLOR-CON) CR tablet 10 mEq  10 mEq Oral BID Sanjuana Kava, NP   10 mEq at 02/26/12 1142  . thiamine (B-1) injection 100 mg  100 mg Intramuscular Once Verne Spurr, PA-C   100 mg at 02/25/12 2235  . thiamine (VITAMIN B-1) tablet 100 mg  100 mg Oral Daily Verne Spurr, PA-C   100 mg at 02/26/12 5621  . tiotropium (SPIRIVA) inhalation capsule 18 mcg  18 mcg Inhalation Daily Sanjuana Kava, NP   18 mcg at 02/26/12 1141   Facility-Administered Medications Ordered in Other Encounters  Medication Dose Route Frequency Provider Last Rate Last Dose  . albuterol (PROVENTIL) (5 MG/ML) 0.5% nebulizer solution 5 mg  5 mg Nebulization Once Renne Crigler, PA   2.5 mg at 02/25/12 1537  . ipratropium (ATROVENT) 0.02 % nebulizer solution        0.5 mg at 02/25/12 1537  . ipratropium (ATROVENT) nebulizer solution 0.5 mg  0.5 mg Nebulization Once Renne Crigler, PA   0.5 mg at 02/25/12 1537  . LORazepam (ATIVAN) tablet 1 mg  1 mg Oral Once Renne Crigler, PA   1 mg at 02/25/12 1536    Lab Results:  Results for orders placed during the hospital encounter of 02/25/12 (from the past 48 hour(s))  CBC     Status: Abnormal   Collection Time   02/25/12  3:23 PM      Component Value Range Comment   WBC 8.1  4.0 - 10.5 (K/uL)    RBC 4.08 (*) 4.22 - 5.81 (MIL/uL)    Hemoglobin 13.6  13.0 - 17.0 (g/dL)    HCT 30.8  65.7 - 84.6 (%)    MCV 95.8  78.0 - 100.0 (fL)    MCH 33.3  26.0 -  34.0 (pg)    MCHC 34.8  30.0 - 36.0 (g/dL)    RDW 96.2 (*) 95.2 - 15.5 (%)    Platelets 257  150 - 400 (K/uL)   COMPREHENSIVE METABOLIC PANEL     Status: Abnormal   Collection Time   02/25/12  3:23 PM      Component Value Range Comment   Sodium 133 (*)  135 - 145 (mEq/L)    Potassium 3.4 (*) 3.5 - 5.1 (mEq/L)    Chloride 94 (*) 96 - 112 (mEq/L)    CO2 25  19 - 32 (mEq/L)    Glucose, Bld 91  70 - 99 (mg/dL)    BUN 3 (*) 6 - 23 (mg/dL)    Creatinine, Ser 1.61  0.50 - 1.35 (mg/dL)    Calcium 9.2  8.4 - 10.5 (mg/dL)    Total Protein 7.6  6.0 - 8.3 (g/dL)    Albumin 3.7  3.5 - 5.2 (g/dL)    AST 096 (*) 0 - 37 (U/L) HEMOLYSIS AT THIS LEVEL MAY AFFECT RESULT   ALT 51  0 - 53 (U/L)    Alkaline Phosphatase 127 (*) 39 - 117 (U/L)    Total Bilirubin 0.5  0.3 - 1.2 (mg/dL)    GFR calc non Af Amer 87 (*) >90 (mL/min)    GFR calc Af Amer >90  >90 (mL/min)   ETHANOL     Status: Abnormal   Collection Time   02/25/12  3:23 PM      Component Value Range Comment   Alcohol, Ethyl (B) 203 (*) 0 - 11 (mg/dL)   URINE RAPID DRUG SCREEN (HOSP PERFORMED)     Status: Normal   Collection Time   02/25/12  5:50 PM      Component Value Range Comment   Opiates NONE DETECTED  NONE DETECTED     Cocaine NONE DETECTED  NONE DETECTED     Benzodiazepines NONE DETECTED  NONE DETECTED     Amphetamines NONE DETECTED  NONE DETECTED     Tetrahydrocannabinol NONE DETECTED  NONE DETECTED     Barbiturates NONE DETECTED  NONE DETECTED     Physical Findings: AIMS:   CIWA:   CIWA-Ar Total: 5  COWS:      Treatment Plan Summary: Daily contact with patient to assess and evaluate symptoms and progress in treatment Medication management  Risk of harm to self is elevated by his addictions.  He has a desire to live for  Himself.  Risk of harm to others is elevated by his past history of lots of fights and legal charges for aggression, but none in 2 decades.  Plan: We will admit the patient for crisis stabilization and  treatment. I talked to pt about restarting his home meds for gout, COPD, chest pains, low potassium and arthritis.  Also put him on detox protocol. I explained the risks and benefits of medication in detail.  We will continue on q. 15 checks the unit protocol. At this time there is no clinical indication for one-to-one observation as patient contract for safety and presents little risk to harm themself and others.  We will increase collateral information. I encourage patient to participate in group milieu therapy. Pt will be seen in treatment team meeting tomorrow morning for further treatment and appropriate discharge planning. Please see history and physical note for more detailed information ELOS: 3 to 5 days.   Zachary [name] 02/26/2012, 3:28 PM

## 2012-02-26 NOTE — H&P (Signed)
Psychiatric Admission Assessment Adult  Patient Identification:  Zachary [name]  Date of Evaluation:  02/26/2012  Chief Complaint:  ETOH DEPENDENCE DEPRESSIVE D/O,NOS  History of Present Illness:: This is a 71 year old Caucasian male who is a walk-in admission to [facility] with complaints of alcohol abuse/dependence requesting detox treatment. Patient reports, "I'm here because I drink too much alcohol. I like the test of beer and I have been drinking since I was 71 years old. I started drinking at a very young age because we were making alcoholic beverage at that time. It is called moon shine.  So, I got use to drinking alcohol and the rest is history. I know that I am an alcoholic, but I quit drinking and was sober x 10 years, then I went back to drinking. I was in this hospital 2 times in the past, this is my third time here. I decided to come back to get cleaned again because I was falling too much. I can hardly stand up. I had a blackout last Friday"  Mood Symptoms:  Guilt,  Depression Symptoms:  depressed mood, difficulty concentrating, impaired memory, loss of energy/fatigue,  (Hypo) Manic Symptoms:  Irritable Mood, Labiality of Mood,  Anxiety Symptoms:  Excessive Worry,  Psychotic Symptoms:  Hallucinations: None  PTSD Symptoms: Had a traumatic exposure:  Patient denies any traumatic event in his life.  Past Psychiatric History: Diagnosis: Alcohol dependence  Hospitalizations: [facility] x 3  Outpatient Care: [facility] with Dr. Dierdre Searles  Substance Abuse Care: "I have to the treatment center on [address] in the past, I just can't remember the name"  Self-Mutilation: Patient denies.  Suicidal Attempts: Patient denies  Violent Behaviors: Patient denies.   Past Medical History:   Past Medical History  Diagnosis Date  . Hypertension   . Coronary artery disease   . Hyperlipemia   . No pertinent past medical history   . Angina   . Shortness of breath   . Mental disorder   .  Arthritis   . COPD (chronic obstructive pulmonary disease)   . Hiatal hernia   . Depression   . Gout    Loss of Consciousness:  "I had a blackout last Friday" Seizure History:  "I get withdrawal seizures sometimes" Cardiac History:  HTN, CAD, COPD, Hyperlipisemia.  Allergies:  No Known Allergies  PTA Medications: Prescriptions prior to admission  Medication Sig Dispense Refill  . allopurinol (ZYLOPRIM) 300 MG tablet Take 300 mg by mouth 2 (two) times daily.        . celecoxib (CELEBREX) 200 MG capsule Take 200 mg by mouth 2 (two) times daily.        . clopidogrel (PLAVIX) 75 MG tablet Take 75 mg by mouth daily.        . colchicine 0.6 MG tablet Take 0.6 mg by mouth daily.      Marland Kitchen dexlansoprazole (DEXILANT) 60 MG capsule Take 60 mg by mouth daily.        . DULoxetine (CYMBALTA) 30 MG capsule Take 30 mg by mouth daily.        Marland Kitchen gabapentin (NEURONTIN) 300 MG capsule Take 300 mg by mouth 3 (three) times daily.        . isosorbide mononitrate (IMDUR) 30 MG 24 hr tablet Take 30 mg by mouth daily.        . metoprolol tartrate (LOPRESSOR) 25 MG tablet Take 25 mg by mouth 2 (two) times daily.        . montelukast (SINGULAIR) 10  MG tablet Take 10 mg by mouth at bedtime.        Marland Kitchen olmesartan-hydrochlorothiazide (BENICAR HCT) 20-12.5 MG per tablet Take 1 tablet by mouth daily.        . potassium chloride (KLOR-CON) 10 MEQ CR tablet Take 10 mEq by mouth.        . thiamine 100 MG tablet Take 1 tablet (100 mg total) by mouth daily.  30 tablet  0  . tiotropium (SPIRIVA) 18 MCG inhalation capsule Place 18 mcg into inhaler and inhale daily.        . traZODone (DESYREL) 50 MG tablet Take 50 mg by mouth at bedtime.          Previous Psychotropic Medications:  Medication/Dose  See medication lists.               Substance Abuse History in the last 12 months: Substance Age of 1st Use Last Use Amount Specific Type  Nicotine "I quit smoking"     Alcohol 11 Prior to hosp 40 oz Beer  Cannabis "I  quit this last year"     Opiates Denies use     Cocaine Denies use     Methamphetamines Denies use     LSD Denies use     Ecstasy Denies use     Benzodiazepines Denies use     Caffeine      Inhalants      Others:                         Consequences of Substance Abuse: Medical Consequences:  Liver damage,   Legal Consequences:  Arrests, jail time Family Consequences:  Family discord Blackouts:   Withdrawal Symptoms:   Headaches Tremors  Social History: Current Place of Residence:  [place]  Place of Birth: [place], [place]    Family Members: "All my family members are dead, except 1 sister who lives in [place]"  Marital Status:  Widowed  Children: 0  Sons: 0  Daughters: 0  Relationships: "I'm not in a relationship"  Education:  No HS diploma  Educational Problems/Performance: "I did not finish high school"  Religious Beliefs/Practices: None reported  History of Abuse (Emotional/Phsycial/Sexual): None reported  Occupational Experiences: "I draw SSI"  Military History:  None.  Legal History: None reported  Hobbies/Interests: None reported  Family History:  No family history on file.  Mental Status Examination/Evaluation: Objective:  Appearance: Disheveled  Eye Contact::  Good  Speech:  Clear and Coherent  Volume:  Normal  Mood:  "I'm not depressed"  Affect:  Congruent  Thought Process:  Coherent  Orientation:  Other:  Oriented to person and place  Thought Content:  Rumination  Suicidal Thoughts:  No  Homicidal Thoughts:  No  Memory:  Immediate;   Good Recent;   Fair Remote;   Poor  Judgement:  Poor  Insight:  Fair  Psychomotor Activity:  Tremor  Concentration:  Fair  Recall:  Fair  Akathisia:  No  Handed:  Right  AIMS (if indicated):     Assets:  Desire for Improvement  Sleep:       Laboratory/X-Ray: None Psychological Evaluation(s)      Assessment:    AXIS I:  Alcohol dependence AXIS II:  Deferred AXIS III:   Past Medical History    Diagnosis Date  . Hypertension   . Coronary artery disease   . Hyperlipemia   . No pertinent past medical history   . Angina   .  Shortness of breath   . Mental disorder   . Arthritis   . COPD (chronic obstructive pulmonary disease)   . Hiatal hernia   . Depression   . Gout    AXIS IV:  economic problems, other psychosocial or environmental problems and problems related to social environment AXIS V:  41-50 serious symptoms  Treatment Plan/Recommendations:  Admit for safety and stabilization.                                                                 Review and reinstate any pertinent home medications.                                                                 Obtain urinalysis.                                                           Treatment Plan Summary: Daily contact with patient to assess and evaluate symptoms and progress in treatment Medication management Current Medications:  Current Facility-Administered Medications  Medication Dose Route Frequency Provider Last Rate Last Dose  . acetaminophen (TYLENOL) tablet 650 mg  650 mg Oral Q6H PRN Verne Spurr, PA-C      . alum & mag hydroxide-simeth (MAALOX/MYLANTA) 200-200-20 MG/5ML suspension 30 mL  30 mL Oral Q4H PRN Verne Spurr, PA-C      . chlordiazePOXIDE (LIBRIUM) capsule 25 mg  25 mg Oral Q6H PRN Verne Spurr, PA-C      . chlordiazePOXIDE (LIBRIUM) capsule 25 mg  25 mg Oral QID Verne Spurr, PA-C   25 mg at 02/25/12 2233   Followed by  . chlordiazePOXIDE (LIBRIUM) capsule 25 mg  25 mg Oral TID Verne Spurr, PA-C       Followed by  . chlordiazePOXIDE (LIBRIUM) capsule 25 mg  25 mg Oral BH-qamhs Verne Spurr, PA-C       Followed by  . chlordiazePOXIDE (LIBRIUM) capsule 25 mg  25 mg Oral Daily Verne Spurr, PA-C      . hydrOXYzine (ATARAX/VISTARIL) tablet 25 mg  25 mg Oral Q6H PRN Verne Spurr, PA-C      . loperamide (IMODIUM) capsule 2-4 mg  2-4 mg Oral PRN Verne Spurr, PA-C      . magnesium  hydroxide (MILK OF MAGNESIA) suspension 30 mL  30 mL Oral Daily PRN Verne Spurr, PA-C      . mulitivitamin with minerals tablet 1 tablet  1 tablet Oral Daily Verne Spurr, PA-C      . ondansetron (ZOFRAN-ODT) disintegrating tablet 4 mg  4 mg Oral Q6H PRN Verne Spurr, PA-C      . thiamine (B-1) injection 100 mg  100 mg Intramuscular Once Verne Spurr, PA-C   100 mg at 02/25/12 2235  . thiamine (VITAMIN B-1) tablet 100 mg  100 mg Oral Daily Verne Spurr, PA-C  Facility-Administered Medications Ordered in Other Encounters  Medication Dose Route Frequency Provider Last Rate Last Dose  . albuterol (PROVENTIL) (5 MG/ML) 0.5% nebulizer solution 5 mg  5 mg Nebulization Once Renne Crigler, PA   2.5 mg at 02/25/12 1537  . ipratropium (ATROVENT) 0.02 % nebulizer solution        0.5 mg at 02/25/12 1537  . ipratropium (ATROVENT) nebulizer solution 0.5 mg  0.5 mg Nebulization Once Renne Crigler, PA   0.5 mg at 02/25/12 1537  . LORazepam (ATIVAN) tablet 1 mg  1 mg Oral Once Renne Crigler, PA   1 mg at 02/25/12 1536    Observation Level/Precautions:  Q 15 minutes checks for safety  Laboratory:  UA  Psychotherapy:  Group  Medications:  See medication lists.  Routine PRN Medications:  Yes  Consultations:  Not indicated at this time  Discharge Concerns: Safety  Other:     Sanjuana Kava 3/25/20139:03 AM

## 2012-02-26 NOTE — ED Provider Notes (Signed)
See prior note  IVC papers signed around 23:30.     Ward Givens, MD 02/26/12 1040

## 2012-02-26 NOTE — Progress Notes (Addendum)
Adult Psychosocial Assessment Update Interdisciplinary Team  Previous [facility] admissions/discharges:  Admissions Discharges  Date:   10/13/11 Date:   10/17/11  Date:     04/26/11 Date:     05/09/11  Date: Date:  Date: Date:  Date: Date:   Changes since the last Psychosocial Assessment (including adherence to outpatient mental health and/or substance abuse treatment, situational issues contributing to decompensation and/or relapse). Stayed sober about 3 weeks after last detox admission & did not follow up with outpatient  Appointments. Has recently been drinking about four 40 oz beers daily, and reports that  He has been experiencing nausea, diarrhea and tremors along with drinking. Juvon   Reports that recently he has been getting dizzy, blacking out and falling a lot. He has   Several bruises from these falls.      Discharge Plan 1. Will you be returning to the same living situation after discharge?   Yes:              X No:      If no, what is your plan?    Kavian reports he lives with friends and can return to their home at time of discharge.        2. Would you like a referral for services when you are discharged? Yes:   X       If yes, for what services?  No:       Yes, would like follow up with therapist and psychiatrist.       Summary and Recommendations (to be completed by the evaluator) Kairos is a 71 year old widowed male diagnosed with Alcohol Dependence. He reports  He has been drinking since age 12 and has a long family history of alcohol abuse. States  He just really likes the taste of beer. No family supports as his closest living relative is   In [place], though he thinks the couple he lives with are pretty supportive to him.  Not  Interested in long term treatment; has been to [facility] in the past. Gustave would   Benefit from crisis stabilization, medication evaluation, therapy groups for processing   Thoughts/feelings/experiences,  psychoed groups for coping skills, and case   Management for discharge planning.          Signature:  [name], 02/26/2012 9:03 AM

## 2012-02-26 NOTE — Progress Notes (Signed)
Has been in bed entire shift. Had to awoken to take HS meds. Opened eyes spontaneously to name. Appears flat and depressed. Calm and cooperative with assessment. No acute distress noted. States hehas been very sleepy and wants to get back to sleep. VS taken and stable. Denies any WD symptoms and no subjective signs of WD noted. Denies pain or discomfort. Denies SI/HI/AVH and contracts for safety. No questions or concerns expressed. POC and medications for the shift reviewed and understanding verbalized. Safety has been maintained with Q15 minute observation. Will continue current POC.

## 2012-02-26 NOTE — Tx Team (Signed)
Interdisciplinary Treatment Plan Update (Adult)  Date:  02/26/2012  Time Reviewed:  11:32 AM   Progress in Treatment: Attending groups:   Yes   Participating in groups:  Yes Taking medication as prescribed:  Yes Tolerating medication:  Yes Family/Significant othe contact made: To be assessed by Counsel for contact with family Patient understands diagnosis:  Yes Discussing patient identified problems/goals with staff: Yes Medical problems stabilized or resolved: Yes Denies suicidal/homicidal ideation:Yes Issues/concerns per patient self-inventory: None reported Other:  New problem(s) identified:  Reason for Continuation of Hospitalization: Depression Medication stabilization Withdrawal symptoms  Interventions implemented related to continuation of hospitalization:  Medication Management; safety checks q 15 mins  Additional comments:  Estimated length of stay:  Discharge Plan:  New goal(s):  Review of initial/current patient goals per problem list:   1.  Goal(s):  Detox from alcohol  Met:  Yes  Target date:  d/c  As evidenced by: Detox protocol will be completed  2.  Goal (s):  Reduce depression  Met:  No  Target date: d/c  As evidenced by:  Patient will rate depression at four or below  3.  Goal(s):  Stabilize on medications  Met:  No  Target date: d/c  As evidenced by:  Patient will report being stable on medications - less symptomatic  4.  Goal(s):  Refer for outpatient follow up  Met:  No  Target date: d/c  As evidenced by:  Follow up will be in place  Attendees: Patient:     Family:     Physician:  Orson Aloe, MD 02/26/2012 11:32 AM   Nursing:   Quintella Reichert, RN 02/26/2012 11:32 AM   CaseManager:  Juline Patch, LCSW 02/26/2012 11:32 AM   Counselor:  Angus Palms, LCSW 02/26/2012 11:32 AM   Other:  Council Mechanic, NP 02/26/2012 11:32 AM   Other:  Reyes Ivan, LCSWA 02/26/2012  11:32 AM  Other:  Roswell Miners, RN 02/26/2012  11:38 AM    Other:      Scribe for Treatment Team:   Wynn Banker, LCSW,  02/26/2012 11:32 AM

## 2012-02-26 NOTE — Progress Notes (Signed)
Pt resting comfortably in bed, moderate tremors noted to bilateral hands. Pt agreed to took scheduled medications without complaint including Thiamine injection. Pt complained of leg cramps, requesting potassium, offered Gatorade pt agreed. Pt mood depressed, denies SI and HI. Bernie Covey, RN

## 2012-02-26 NOTE — Progress Notes (Signed)
Recreation Therapy Group Note  Date: 02/26/2012        Time: 1145       Group Topic/Focus: Patient invited to participate in animal assisted therapy. Pets as a coping skill and responsibility were discussed.   Participation Level: Active  Participation Quality: Appropriate and Attentive  Affect: Appropriate  Cognitive: Appropriate and Oriented   Additional Comments: Patient initially angrily refused pet therapy, but brightened significantly when he saw the therapy dog. Patient stayed in group for the entire session and was among the last patients to leave for lunch.

## 2012-02-26 NOTE — H&P (Signed)
Medical/psychiatric screening examination/treatment/procedure(s) were performed by non-physician practitioner and as supervising physician I was immediately available for consultation/collaboration.   I have seen and examined this patient and agree with this evaluation.  

## 2012-02-26 NOTE — Progress Notes (Signed)
[facility] Group Notes: (Counselor/Nursing/MHT/Case Management/Adjunct)  02/26/2012 @ 11:00am   Type of Therapy: Group Therapy   Participation Level: Minimal   Participation Quality: Attentive   Affect: Anxious   Cognitive: Appropriate   Insight: None   Engagement in Group: None   Engagement in Therapy: None   Modes of Intervention: Support and Exploration   Summary of Progress/Problems: Zachary [name] was attentive but not engaged in group process   [name] 02/26/2012  3:58 PM

## 2012-02-27 NOTE — Progress Notes (Signed)
[facility] MD Progress Note  02/27/2012 9:27 PM  Diagnosis:  Axis I: Alcohol Dependence with acute intoxication  ADL's:  Intact  Sleep: Good  Appetite:  Good  Suicidal Ideation:  Pt denies suicidal thoughts Homicidal Ideation:  Denies adamantly any homicidal thoughts.  Mental Status Examination/Evaluation: Objective:  Appearance: Disheveled  Eye Contact::  Fair  Speech:  Clear and Coherent  Volume:  Normal  Mood:  Euthymic  Affect:  Congruent  Thought Process:  Coherent  Orientation:  Full  Thought Content:  WDL  Suicidal Thoughts:  No  Homicidal Thoughts:  No  Memory:  Immediate;   Fair  Judgement:  Impaired  Insight:  Lacking  Psychomotor Activity:  Normal  Concentration:  Fair  Recall:  Fair  Akathisia:  No  AIMS (if indicated):     Assets:  Communication Skills Desire for Improvement Financial Resources/Insurance Resilience  Sleep:  Number of Hours: 6    Vital Signs:Blood pressure 109/73, pulse 76, temperature 98.1 F (36.7 C), temperature source Oral, resp. rate 16, height 5\' 3"  (1.6 m), weight 65.318 kg (144 lb), SpO2 98.00%. Current Medications: Current Facility-Administered Medications  Medication Dose Route Frequency Provider Last Rate Last Dose  . acetaminophen (TYLENOL) tablet 650 mg  650 mg Oral Q6H PRN Verne Spurr, PA-C   650 mg at 02/26/12 2255  . allopurinol (ZYLOPRIM) tablet 300 mg  300 mg Oral BID Sanjuana Kava, NP   300 mg at 02/27/12 1653  . alum & mag hydroxide-simeth (MAALOX/MYLANTA) 200-200-20 MG/5ML suspension 30 mL  30 mL Oral Q4H PRN Verne Spurr, PA-C      . celecoxib (CELEBREX) capsule 200 mg  200 mg Oral BID Sanjuana Kava, NP   200 mg at 02/27/12 1653  . chlordiazePOXIDE (LIBRIUM) capsule 25 mg  25 mg Oral Q6H PRN Verne Spurr, PA-C      . chlordiazePOXIDE (LIBRIUM) capsule 25 mg  25 mg Oral QID Verne Spurr, PA-C   25 mg at 02/27/12 0809   Followed by  . chlordiazePOXIDE (LIBRIUM) capsule 25 mg  25 mg Oral TID Verne Spurr, PA-C   25  mg at 02/27/12 1654   Followed by  . chlordiazePOXIDE (LIBRIUM) capsule 25 mg  25 mg Oral BH-qamhs Verne Spurr, PA-C       Followed by  . chlordiazePOXIDE (LIBRIUM) capsule 25 mg  25 mg Oral Daily Verne Spurr, PA-C      . clopidogrel (PLAVIX) tablet 75 mg  75 mg Oral Q breakfast Sanjuana Kava, NP   75 mg at 02/27/12 1259  . colchicine tablet 0.6 mg  0.6 mg Oral Daily Mike Craze, MD   0.6 mg at 02/27/12 0810  . DULoxetine (CYMBALTA) DR capsule 30 mg  30 mg Oral Daily Sanjuana Kava, NP   30 mg at 02/27/12 0810  . gabapentin (NEURONTIN) capsule 300 mg  300 mg Oral TID Sanjuana Kava, NP   300 mg at 02/27/12 1654  . hydrochlorothiazide (MICROZIDE) capsule 12.5 mg  12.5 mg Oral Daily Sanjuana Kava, NP   12.5 mg at 02/27/12 1610  . hydrOXYzine (ATARAX/VISTARIL) tablet 25 mg  25 mg Oral Q6H PRN Verne Spurr, PA-C      . isosorbide mononitrate (IMDUR) 24 hr tablet 30 mg  30 mg Oral Daily Sanjuana Kava, NP   30 mg at 02/27/12 9604  . loperamide (IMODIUM) capsule 2-4 mg  2-4 mg Oral PRN Verne Spurr, PA-C      . magnesium hydroxide (MILK OF  MAGNESIA) suspension 30 mL  30 mL Oral Daily PRN Verne Spurr, PA-C      . metoprolol tartrate (LOPRESSOR) tablet 25 mg  25 mg Oral BID Sanjuana Kava, NP   25 mg at 02/27/12 1655  . montelukast (SINGULAIR) tablet 10 mg  10 mg Oral QHS Mike Craze, MD   10 mg at 02/27/12 2124  . mulitivitamin with minerals tablet 1 tablet  1 tablet Oral Daily Verne Spurr, PA-C   1 tablet at 02/27/12 [phone]  . ondansetron (ZOFRAN-ODT) disintegrating tablet 4 mg  4 mg Oral Q6H PRN Verne Spurr, PA-C      . pantoprazole (PROTONIX) EC tablet 40 mg  40 mg Oral Q1200 Mike Craze, MD   40 mg at 02/27/12 1221  . potassium chloride (K-DUR,KLOR-CON) CR tablet 10 mEq  10 mEq Oral BID Sanjuana Kava, NP   10 mEq at 02/27/12 1655  . thiamine (VITAMIN B-1) tablet 100 mg  100 mg Oral Daily Verne Spurr, PA-C   100 mg at 02/27/12 1308  . tiotropium (SPIRIVA) inhalation capsule 18 mcg   18 mcg Inhalation Daily Sanjuana Kava, NP   18 mcg at 02/27/12 0813  . traZODone (DESYREL) tablet 50 mg  50 mg Oral QHS Mike Craze, MD   50 mg at 02/27/12 2124    Lab Results:  Results for orders placed during the hospital encounter of 02/25/12 (from the past 48 hour(s))  URINALYSIS, ROUTINE W REFLEX MICROSCOPIC     Status: Normal   Collection Time   02/26/12  5:40 PM      Component Value Range Comment   Color, Urine YELLOW  YELLOW  YELLOW   APPearance CLEAR  CLEAR  CLEAR   Specific Gravity, Urine 1.018  1.005 - 1.030  1.018   pH 6.5  5.0 - 8.0  6.5   Glucose, UA NEGATIVE  NEGATIVE (mg/dL) NEGATIVE   Hgb urine dipstick NEGATIVE  NEGATIVE  NEGATIVE   Bilirubin Urine NEGATIVE  NEGATIVE  NEGATIVE   Ketones, ur NEGATIVE  NEGATIVE (mg/dL) NEGATIVE   Protein, ur NEGATIVE  NEGATIVE (mg/dL) NEGATIVE   Urobilinogen, UA 1.0  0.0 - 1.0 (mg/dL) 1.0   Nitrite NEGATIVE  NEGATIVE  NEGATIVE   Leukocytes, UA NEGATIVE  NEGATIVE      Physical Findings: AIMS:  , ,  ,  ,    CIWA:  CIWA-Ar Total: 2  COWS:     Treatment Plan Summary: Daily contact with patient to assess and evaluate symptoms and progress in treatment Medication management  Plan: Pt stated inh discharge planning that he craves alcohol like other people crave food. He was offered some medications that might help his cravings.  He was most interested in that.  He was informed that we will start that on Thursday to get him past his withdrawal.  Mirren Gest 02/27/2012, 9:27 PM

## 2012-02-27 NOTE — Progress Notes (Signed)
Pt has been calm and cooperative this evening. Pt did not attend group but did spend some time outside of his bedroom during this shift. Pt reports some depression. Pt has remained fall free at this time with the use of a wheelchair. Pt vitals this evenings were WNL. Pt reported some mouth dryness upon waking up and was given some Gatorade. Continued support and availability as needed has been extended to his pt. Pt safety remains with q17min checks.

## 2012-02-27 NOTE — Progress Notes (Signed)
[facility] Group Notes: (Counselor/Nursing/MHT/Case Management/Adjunct) 02/27/2012   @ 11:00 Feelings About Diagnosis  Type of Therapy:  Group Therapy  Participation Level:    Participation Quality:    Affect:    Cognitive:  Appropriate  Insight:    Engagement in Group:   Engagement in Therapy:    Modes of Intervention:  Support and Exploration  Summary of Progress/Problems: Jacquese sat through the first half of group, then stormed out stating that he does not understand any of what anyone in this group is talking about because he has never been depressed or anxious. On his way out the door he stated "None of this has anything to do with me. I drink beer for the same reason you people drink Pepsi; it tastes good!   [name] 02/27/2012  4:00 PM

## 2012-02-27 NOTE — Progress Notes (Signed)
Patient's self inventory sheet, patient sleeps well, has good appetite, normal energy level, good attention span.  N/A for depression and hopeless.  Stated he has been shaky.  Pain goal #4-5.  Worst pain #9.  Plans to "Quit drinking and go to AA.  Quit drinking for 10 years, hope I can do it again.    No questions for staff.  Has been ambulating with wheelchair in hallway.   Patient has been cooperative, pleasant and alert today.

## 2012-02-27 NOTE — Progress Notes (Signed)
Grief and loss group   Facilitated grief and loss group on [address] [facility]. Discussed group rules and limits of privacy/confidentiality. Discussed types of loss and normal grief reactions. Invited group members to share experiences w/ grief; topics focused on death/dying and interpersonal losses. Group was initially quiet but progressively engaged, was supportive of one another.   Pt was present for duration of group but did not speak or actively participate. Pt appeared to listen attentively in beginning, but appeared to become lethargic and progressively disengaged.  Jetty Berland B MS, LPCA, NCC

## 2012-02-27 NOTE — Progress Notes (Signed)
[facility] Group Notes: (Counselor/Nursing/MHT/Case Management/Adjunct)  02/27/2012 @1 :15pm  Relaxation Techniques   Type of Therapy: Group Therapy   Participation Level: Good   Participation Quality: Attentive, Appropriate   Affect: Appropriate   Cognitive: Appropriate   Insight: Good   Engagement in Group: Good    Engagement in Therapy: Good   Modes of Intervention: Support and Exploration   Summary of Progress/Problems: Steward Drone participated in focused breathing and progressive muscle relaxation exercises.   [name] 02/27/2012  4:06 PM

## 2012-02-28 MED ORDER — ACAMPROSATE CALCIUM 333 MG PO TBEC
666.0000 mg | DELAYED_RELEASE_TABLET | Freq: Three times a day (TID) | ORAL | Status: DC
  Administered 2012-02-28 – 2012-03-06 (×21): 666 mg via ORAL
  Filled 2012-02-28: qty 18
  Filled 2012-02-28 (×5): qty 2
  Filled 2012-02-28: qty 18
  Filled 2012-02-28 (×6): qty 2
  Filled 2012-02-28: qty 18
  Filled 2012-02-28 (×9): qty 2
  Filled 2012-02-28: qty 18
  Filled 2012-02-28 (×3): qty 2
  Filled 2012-02-28: qty 18
  Filled 2012-02-28 (×2): qty 2
  Filled 2012-02-28: qty 18

## 2012-02-28 NOTE — Tx Team (Signed)
Interdisciplinary Treatment Plan Update (Adult)  Date:  02/28/2012  Time Reviewed:  11:22 AM   Progress in Treatment: Attending groups:   Yes   Participating in groups:  Yes Taking medication as prescribed:  Yes Tolerating medication:  Yes Family/Significant othe contact made:  Patient understands diagnosis:  Yes Discussing patient identified problems/goals with staff: Yes Medical problems stabilized or resolved: Yes Denies suicidal/homicidal ideation:Yes Issues/concerns per patient self-inventory:  Other:  New problem(s) identified:  Reason for Continuation of Hospitalization: Medication stabilization Withdrawal symptoms  Interventions implemented related to continuation of hospitalization:  Medication Management; safety checks q 15 mins  Additional comments:  Estimated length of stay: 1-2 days  Discharge Plan:  New goal(s):  Review of initial/current patient goals per problem list:   1.  Goal(s): Detox from ETOH  Met:  Yes  Target date: d/c  As evidenced by:  Patient will have completed detox  2.  Goal (s):  Reduce depression  Met:  Yes  Target date: d/c  As evidenced by:  Patient is denying depression  3.  Goal(s):  Stabilize on medications  Met:  Yes  Target date:  d/c  As evidenced by:  Patient will reports being stable on mediaitons  4.  Goal (s)  Refer for residential Treatment  . Met:  Yes  .  Target date:  D/c  .  As evidenced by: Appointment in place for assessment interview at [facility]    Attendees: Patient:  Zachary [name] 3/27/201311:22 AM  Family:     Physician:  Orson Aloe, MD 02/28/2012 11:22 AM   Nursing:   Baird Cancer, RN 02/28/2012 11:22 AM   CaseManager:  Juline Patch, LCSW 02/28/2012 11:22 AM   Counselor:  Angus Palms, LCSW 02/28/2012 11:22 AM   Other:  Scot Jun, RN 02/28/2012 11:22 AM   Other:  Reyes Ivan, LCSWA 02/28/2012  11:22 AM   Other:     Other:      Scribe for Treatment Team:   Wynn Banker, LCSW,  02/28/2012 11:22 AM

## 2012-02-28 NOTE — Progress Notes (Signed)
[facility] MD Progress Note  02/28/2012 1:56 PM  I am doing fine. My mood is okay. I still has tremors to my legs. That is why I can't stand up and or walk without the risks of me falling. That is why I am using this wheelchair"  Diagnosis:   Axis I: Alcohol dependence with acute alcoholic intoxication. Axis II: Deferred Axis III:  Past Medical History  Diagnosis Date  . Hypertension   . Coronary artery disease   . Hyperlipemia   . No pertinent past medical history   . Angina   . Shortness of breath   . Mental disorder   . Arthritis   . COPD (chronic obstructive pulmonary disease)   . Hiatal hernia   . Depression   . Gout    Axis IV: No changes Axis V: 61-70 mild symptoms  ADL's:  Intact  Sleep: Good  Appetite:  Good  Suicidal Ideation:  Plan:  No Intent:  No Means:  No Homicidal Ideation:  Plan:  No Intent:  no Means:  No  AEB (as evidenced by): Per patient's report.  Mental Status Examination/Evaluation: Objective:  Appearance: Disheveled  Eye Contact::  Good  Speech:  Clear and Coherent  Volume:  Normal  Mood:  Euthymic  Affect:  Appropriate  Thought Process:  Coherent  Orientation:  Other:  oriented x 2  Thought Content:  Rumination  Suicidal Thoughts:  No  Homicidal Thoughts:  No  Memory:  Immediate;   Fair Recent;   Fair Remote;   Fair  Judgement:  Impaired  Insight:  Fair  Psychomotor Activity:  Tremor  Concentration:  Fair  Recall:  Fair  Akathisia:  No  Handed:  Right  AIMS (if indicated):     Assets:  Desire for Improvement  Sleep:  Number of Hours: 6.75    Vital Signs:Blood pressure 132/78, pulse 54, temperature 97.8 F (36.6 C), temperature source Oral, resp. rate 19, height 5\' 3"  (1.6 m), weight 65.318 kg (144 lb), SpO2 98.00%. Current Medications: Current Facility-Administered Medications  Medication Dose Route Frequency Provider Last Rate Last Dose  . acetaminophen (TYLENOL) tablet 650 mg  650 mg Oral Q6H PRN Verne Spurr, PA-C   650  mg at 02/28/12 0650  . allopurinol (ZYLOPRIM) tablet 300 mg  300 mg Oral BID Sanjuana Kava, NP   300 mg at 02/28/12 5621  . alum & mag hydroxide-simeth (MAALOX/MYLANTA) 200-200-20 MG/5ML suspension 30 mL  30 mL Oral Q4H PRN Verne Spurr, PA-C      . celecoxib (CELEBREX) capsule 200 mg  200 mg Oral BID Sanjuana Kava, NP   200 mg at 02/28/12 0808  . chlordiazePOXIDE (LIBRIUM) capsule 25 mg  25 mg Oral Q6H PRN Verne Spurr, PA-C      . chlordiazePOXIDE (LIBRIUM) capsule 25 mg  25 mg Oral TID Verne Spurr, PA-C   25 mg at 02/28/12 3086   Followed by  . chlordiazePOXIDE (LIBRIUM) capsule 25 mg  25 mg Oral BH-qamhs Verne Spurr, PA-C       Followed by  . chlordiazePOXIDE (LIBRIUM) capsule 25 mg  25 mg Oral Daily Verne Spurr, PA-C      . clopidogrel (PLAVIX) tablet 75 mg  75 mg Oral Q breakfast Sanjuana Kava, NP   75 mg at 02/28/12 5784  . colchicine tablet 0.6 mg  0.6 mg Oral Daily Mike Craze, MD   0.6 mg at 02/28/12 6962  . DULoxetine (CYMBALTA) DR capsule 30 mg  30 mg Oral  Daily Sanjuana Kava, NP   30 mg at 02/28/12 0981  . gabapentin (NEURONTIN) capsule 300 mg  300 mg Oral TID Sanjuana Kava, NP   300 mg at 02/28/12 1130  . hydrochlorothiazide (MICROZIDE) capsule 12.5 mg  12.5 mg Oral Daily Sanjuana Kava, NP   12.5 mg at 02/28/12 0808  . hydrOXYzine (ATARAX/VISTARIL) tablet 25 mg  25 mg Oral Q6H PRN Verne Spurr, PA-C      . isosorbide mononitrate (IMDUR) 24 hr tablet 30 mg  30 mg Oral Daily Sanjuana Kava, NP   30 mg at 02/28/12 0807  . loperamide (IMODIUM) capsule 2-4 mg  2-4 mg Oral PRN Verne Spurr, PA-C      . magnesium hydroxide (MILK OF MAGNESIA) suspension 30 mL  30 mL Oral Daily PRN Verne Spurr, PA-C      . metoprolol tartrate (LOPRESSOR) tablet 25 mg  25 mg Oral BID Sanjuana Kava, NP   25 mg at 02/28/12 1914  . montelukast (SINGULAIR) tablet 10 mg  10 mg Oral QHS Mike Craze, MD   10 mg at 02/27/12 2124  . mulitivitamin with minerals tablet 1 tablet  1 tablet Oral Daily  Verne Spurr, PA-C   1 tablet at 02/28/12 7829  . ondansetron (ZOFRAN-ODT) disintegrating tablet 4 mg  4 mg Oral Q6H PRN Verne Spurr, PA-C      . pantoprazole (PROTONIX) EC tablet 40 mg  40 mg Oral Q1200 Mike Craze, MD   40 mg at 02/28/12 1130  . potassium chloride (K-DUR,KLOR-CON) CR tablet 10 mEq  10 mEq Oral BID Sanjuana Kava, NP   10 mEq at 02/28/12 5621  . thiamine (VITAMIN B-1) tablet 100 mg  100 mg Oral Daily Verne Spurr, PA-C   100 mg at 02/28/12 3086  . tiotropium (SPIRIVA) inhalation capsule 18 mcg  18 mcg Inhalation Daily Sanjuana Kava, NP   18 mcg at 02/28/12 0807  . traZODone (DESYREL) tablet 50 mg  50 mg Oral QHS Mike Craze, MD   50 mg at 02/27/12 2124    Lab Results:  Results for orders placed during the hospital encounter of 02/25/12 (from the past 48 hour(s))  URINALYSIS, ROUTINE W REFLEX MICROSCOPIC     Status: Normal   Collection Time   02/26/12  5:40 PM      Component Value Range Comment   Color, Urine YELLOW  YELLOW  YELLOW   APPearance CLEAR  CLEAR  CLEAR   Specific Gravity, Urine 1.018  1.005 - 1.030  1.018   pH 6.5  5.0 - 8.0  6.5   Glucose, UA NEGATIVE  NEGATIVE (mg/dL) NEGATIVE   Hgb urine dipstick NEGATIVE  NEGATIVE  NEGATIVE   Bilirubin Urine NEGATIVE  NEGATIVE  NEGATIVE   Ketones, ur NEGATIVE  NEGATIVE (mg/dL) NEGATIVE   Protein, ur NEGATIVE  NEGATIVE (mg/dL) NEGATIVE   Urobilinogen, UA 1.0  0.0 - 1.0 (mg/dL) 1.0   Nitrite NEGATIVE  NEGATIVE  NEGATIVE   Leukocytes, UA NEGATIVE  NEGATIVE      Physical Findings: AIMS:  , ,  ,  ,    CIWA:  CIWA-Ar Total: 2  COWS:     Treatment Plan Summary: Daily contact with patient to assess and evaluate symptoms and progress in treatment Medication management  Plan: Continue current treatment plan.  Armandina Stammer I 02/28/2012, 1:56 PM

## 2012-02-28 NOTE — Progress Notes (Signed)
Patient seen during d/c planning group.  He reports being better but endorses some tremors that make it difficulty to walk.  He denies SI/HI, depression and anxiety.  Patient to consider residential treatment as an option for treatment.  Patient advised he has been drinking since he was 71 years old.

## 2012-02-28 NOTE — Progress Notes (Signed)
Pt observed in bed awake during evening wrap-up group.  Pt states he chose not to go to group tonight.  Pt is on a Librium detox protocol.  He denies withdrawal symptoms at this time.  He says he craves alcohol all the time.  He is considering the use of Antebuse to help him stop drinking.  Discharge plans are not complete.  He will probably follow-up with [facility].  Our interaction was minimal.  He preferred to doze instead of talking.  Encouraged pt to make his needs known.  Safety maintained with q15 minute checks.

## 2012-02-28 NOTE — Progress Notes (Signed)
[facility] Group Notes:  (Counselor/Nursing/MHT/Case Management/Adjunct)  02/28/2012 12:03 PM  Type of Therapy:  Group Therapy  Participation Level:  Did Not Attend   Zachary [name] 02/28/2012, 12:03 PM

## 2012-02-28 NOTE — Progress Notes (Signed)
Pt rates depression at a 1 and hopelessness at a 1. Pt attends groups and interacts well with peers and staff. Pt was offered support and encouragement. Pt has been very sleepy and thinks it is due to his medications but stated he was going to speak with the doctor about it. Pt denies SI/HI. Pt is receptive to treatment and safety is maintained on unit.

## 2012-02-28 NOTE — Progress Notes (Signed)
[facility] MD Progress Note  02/28/2012 2:48 PM  Diagnosis:  Axis I: Alcohol Dependence with acute intoxication  ADL's:  Intact  Sleep: Good  Appetite:  Good  Suicidal Ideation:  Pt denies suicidal thoughts Homicidal Ideation:  Denies adamantly any homicidal thoughts.  Mental Status Examination/Evaluation: Objective:  Appearance: Disheveled, in somewhat dirty hospital gown  Eye Contact::  Fair  Speech:  Clear and Coherent  Volume:  Normal  Mood:  Euthymic  Affect:  Congruent  Thought Process:  Coherent  Orientation:  Full  Thought Content:  WDL  Suicidal Thoughts:  No  Homicidal Thoughts:  No  Memory:  Immediate;   Fair  Judgement:  Fair  Insight:  Fair  Psychomotor Activity:  Normal  Concentration:  Fair  Recall:  Fair  Akathisia:  No  AIMS (if indicated):     Assets:  Communication Skills Desire for Improvement Financial Resources/Insurance Resilience  Sleep:  Number of Hours: 6.75    Vital Signs:Blood pressure 132/78, pulse 54, temperature 97.8 F (36.6 C), temperature source Oral, resp. rate 19, height 5\' 3"  (1.6 m), weight 65.318 kg (144 lb), SpO2 98.00%. Current Medications: Current Facility-Administered Medications  Medication Dose Route Frequency Provider Last Rate Last Dose  . acetaminophen (TYLENOL) tablet 650 mg  650 mg Oral Q6H PRN Verne Spurr, PA-C   650 mg at 02/28/12 0650  . allopurinol (ZYLOPRIM) tablet 300 mg  300 mg Oral BID Sanjuana Kava, NP   300 mg at 02/28/12 8119  . alum & mag hydroxide-simeth (MAALOX/MYLANTA) 200-200-20 MG/5ML suspension 30 mL  30 mL Oral Q4H PRN Verne Spurr, PA-C      . celecoxib (CELEBREX) capsule 200 mg  200 mg Oral BID Sanjuana Kava, NP   200 mg at 02/28/12 0808  . chlordiazePOXIDE (LIBRIUM) capsule 25 mg  25 mg Oral Q6H PRN Verne Spurr, PA-C      . chlordiazePOXIDE (LIBRIUM) capsule 25 mg  25 mg Oral TID Verne Spurr, PA-C   25 mg at 02/28/12 1478   Followed by  . chlordiazePOXIDE (LIBRIUM) capsule 25 mg  25 mg Oral  BH-qamhs Verne Spurr, PA-C       Followed by  . chlordiazePOXIDE (LIBRIUM) capsule 25 mg  25 mg Oral Daily Verne Spurr, PA-C      . clopidogrel (PLAVIX) tablet 75 mg  75 mg Oral Q breakfast Sanjuana Kava, NP   75 mg at 02/28/12 2956  . colchicine tablet 0.6 mg  0.6 mg Oral Daily Mike Craze, MD   0.6 mg at 02/28/12 2130  . DULoxetine (CYMBALTA) DR capsule 30 mg  30 mg Oral Daily Sanjuana Kava, NP   30 mg at 02/28/12 0807  . gabapentin (NEURONTIN) capsule 300 mg  300 mg Oral TID Sanjuana Kava, NP   300 mg at 02/28/12 1130  . hydrochlorothiazide (MICROZIDE) capsule 12.5 mg  12.5 mg Oral Daily Sanjuana Kava, NP   12.5 mg at 02/28/12 0808  . hydrOXYzine (ATARAX/VISTARIL) tablet 25 mg  25 mg Oral Q6H PRN Verne Spurr, PA-C      . isosorbide mononitrate (IMDUR) 24 hr tablet 30 mg  30 mg Oral Daily Sanjuana Kava, NP   30 mg at 02/28/12 0807  . loperamide (IMODIUM) capsule 2-4 mg  2-4 mg Oral PRN Verne Spurr, PA-C      . magnesium hydroxide (MILK OF MAGNESIA) suspension 30 mL  30 mL Oral Daily PRN Verne Spurr, PA-C      . metoprolol tartrate (  LOPRESSOR) tablet 25 mg  25 mg Oral BID Sanjuana Kava, NP   25 mg at 02/28/12 1610  . montelukast (SINGULAIR) tablet 10 mg  10 mg Oral QHS Mike Craze, MD   10 mg at 02/27/12 2124  . mulitivitamin with minerals tablet 1 tablet  1 tablet Oral Daily Verne Spurr, PA-C   1 tablet at 02/28/12 9604  . ondansetron (ZOFRAN-ODT) disintegrating tablet 4 mg  4 mg Oral Q6H PRN Verne Spurr, PA-C      . pantoprazole (PROTONIX) EC tablet 40 mg  40 mg Oral Q1200 Mike Craze, MD   40 mg at 02/28/12 1130  . potassium chloride (K-DUR,KLOR-CON) CR tablet 10 mEq  10 mEq Oral BID Sanjuana Kava, NP   10 mEq at 02/28/12 5409  . thiamine (VITAMIN B-1) tablet 100 mg  100 mg Oral Daily Verne Spurr, PA-C   100 mg at 02/28/12 8119  . tiotropium (SPIRIVA) inhalation capsule 18 mcg  18 mcg Inhalation Daily Sanjuana Kava, NP   18 mcg at 02/28/12 0807  . traZODone (DESYREL)  tablet 50 mg  50 mg Oral QHS Mike Craze, MD   50 mg at 02/27/12 2124    Lab Results:  Results for orders placed during the hospital encounter of 02/25/12 (from the past 48 hour(s))  URINALYSIS, ROUTINE W REFLEX MICROSCOPIC     Status: Normal   Collection Time   02/26/12  5:40 PM      Component Value Range Comment   Color, Urine YELLOW  YELLOW  YELLOW   APPearance CLEAR  CLEAR  CLEAR   Specific Gravity, Urine 1.018  1.005 - 1.030  1.018   pH 6.5  5.0 - 8.0  6.5   Glucose, UA NEGATIVE  NEGATIVE (mg/dL) NEGATIVE   Hgb urine dipstick NEGATIVE  NEGATIVE  NEGATIVE   Bilirubin Urine NEGATIVE  NEGATIVE  NEGATIVE   Ketones, ur NEGATIVE  NEGATIVE (mg/dL) NEGATIVE   Protein, ur NEGATIVE  NEGATIVE (mg/dL) NEGATIVE   Urobilinogen, UA 1.0  0.0 - 1.0 (mg/dL) 1.0   Nitrite NEGATIVE  NEGATIVE  NEGATIVE   Leukocytes, UA NEGATIVE  NEGATIVE      Physical Findings: AIMS:  , ,  ,  ,    CIWA:  CIWA-Ar Total: 2  COWS:     Treatment Plan Summary: Daily contact with patient to assess and evaluate symptoms and progress in treatment Medication management  Plan: Pt was seen in treatment team.  He reported that he is wanting to try and stay clean.  He agrees to try Campral to help with his cravings.  He is even agreeable to trying Antabuse, but will hold back on that for now. He is agreeable to trying residential treatment again.  These are huge changes for this man who says in group that he craves alcohol like others crave Pepsi, he just likes the taste.  Donell Tomkins 02/28/2012, 2:48 PM

## 2012-02-29 MED ORDER — PANTOPRAZOLE SODIUM 40 MG PO TBEC
40.0000 mg | DELAYED_RELEASE_TABLET | Freq: Two times a day (BID) | ORAL | Status: DC
  Administered 2012-03-01 – 2012-03-06 (×11): 40 mg via ORAL
  Filled 2012-02-29 (×18): qty 1

## 2012-02-29 NOTE — Tx Team (Signed)
Patient seen during discharge planning group.  He continues to endorse problems with ambulation. He was informed of being accepted for an admission assessment at [facility] on April 8.  Patient stated he will be have transportation to [facility].

## 2012-02-29 NOTE — Discharge Instructions (Signed)
Attend 90 meetings in 90 days. Get trusted sponsor from the advise of others or from whomever in meetings seems to make sense, has a proven track record, will hold you responsible for your sobriety, and both expects and insists on total abstinence.  Work the steps HONESTLY with the trusted sponsor. Get obsessed with your recovery by often reminding yourself of how DEADLY this dredged horrible disease of addiction JUST IS. Focus the first month on speaker meetings where you specifically look at how your life has been wrecked by drugs/alcohol and how your life has been similar to that of the speakers.   

## 2012-02-29 NOTE — Progress Notes (Signed)
[facility] Group Notes: (Counselor/Nursing/MHT/Case Management/Adjunct)  02/29/2012 11:00am  [facility] in [facility]   Type of Therapy: Group Therapy   Participation Level: Did Not Attend    Zachary [name]  02/29/2012 3:32 PM

## 2012-02-29 NOTE — Progress Notes (Signed)
Pt complained of some heart burn and was given medication for it. Pt continues to use a wheelchair instead of walking. Pt will be going to [facility]. Pt attends some groups. Pt was offered support and encouragement. Pt safety maintained on unit.

## 2012-02-29 NOTE — [facility] Suicide Risk Assessment (Signed)
Suicide Risk Assessment  Discharge Assessment     Demographic factors:  Assessment Details Time of Assessment: Admission Information Obtained From: Patient Current Mental Status:    Loss Factors:  Loss Factors: Decline in physical health Historical Factors:    Risk Reduction Factors:  Risk Reduction Factors: Living with another person, especially a relative  ADL's:  Intact  Sleep: Good  Appetite:  Good  Suicidal Ideation:  Denies adamantly any suicidal thoughts. Homicidal Ideation:  Denies adamantly any homicidal thoughts.  Mental Status Examination/Evaluation: Objective:  Appearance: Casual  Eye Contact::  Good  Speech:  Clear and Coherent  Volume:  Normal  Mood:  Euthymic  Affect:  Congruent  Thought Process:  Coherent  Orientation:  Full  Thought Content:  WDL  Suicidal Thoughts:  No  Homicidal Thoughts:  No  Memory:  Immediate;   Good  Judgement:  Good  Insight:  Good  Psychomotor Activity:  Normal  Concentration:  Good  Recall:  Good  Akathisia:  No  AIMS (if indicated):     Assets:  Communication Skills Desire for Improvement Housing  Sleep: Number of Hours: 6.5    Vital Signs: Blood pressure 119/80, pulse 66, temperature 97.8 F (36.6 C), temperature source Oral, resp. rate 18, height 5\' 3"  (1.6 m), weight 65.318 kg (144 lb), SpO2 97.00%.  Labs No results found for this or any previous visit (from the past 72 hour(s)).  Pt was prepared to leave, but we could not reach the family he was living with.  His discharge was postponed.  Zachary [name] 02/29/2012, 6:08 PM

## 2012-02-29 NOTE — Progress Notes (Signed)
[facility] Group Notes: (Counselor/Nursing/MHT/Case Management/Adjunct)  02/29/2012 1:15pm Lovingkindness/Self-Worth Meditation  Type of Therapy: Group Therapy  Participation Level: Did Not Attend    Zachary [name]  02/29/2012 3:32 PM

## 2012-03-01 LAB — GLUCOSE, CAPILLARY: Glucose-Capillary: 77 mg/dL (ref 70–99)

## 2012-03-01 NOTE — Progress Notes (Signed)
Interdisciplinary Treatment Plan Update (Adult)  Date:  03/01/2012  Time Reviewed:  11:08 AM   Progress in Treatment: Attending groups:   Yes   Participating in groups:  Yes Taking medication as prescribed:  Yes Tolerating medication:  Yes Family/Significant othe contact made: Counselor to make collateral contact with support person Patient understands diagnosis:  Yes Discussing patient identified problems/goals with staff: Yes Medical problems stabilized or resolved: Yes Denies suicidal/homicidal ideation:Yes Issues/concerns per patient self-inventory:   None identified Other:  New problem(s) identified:  Reason for Continuation of Hospitalization:  Interventions implemented related to continuation of hospitalization:  Additional comments:  Estimated length of stay:  Discharge today  Discharge Plan: Home with friends - Follow up with [facility] and [facility]  New goal(s):  Review of initial/current patient goals per problem list:   1.  Goal(s):  Detox  Met:  Yes  Target date:d/c  As evidenced by:  Patient completed detox protocol.  2.  Goal (s):  Stabilize on medication  Met:  Yes  Target date: d/c  As evidenced by: patient reports he is stable on medication and ready to discharge  3.  Goal(s):  Refer for residential treatment  Met:  Yes  Target date: d/c  As evidenced by:   Patient scheduled for an admission assessment at [facility] on 03/11/12  Attendees: Patient:  Zachary [name] 03/01/2012  11:08 AM  Family:     Physician:  Orson Aloe, MD 03/01/2012 11:08 AM   Nursing:   Manuela Schwartz, RN 03/01/2012 11:08 AM   CaseManager:  Juline Patch, LCSW 03/01/2012 11:08 AM   Counselor:  Angus Palms, LCSW 03/01/2012 11:08 AM   Other:  03/01/2012 11:08 AM   Other:  Reyes Ivan, LCSWA 03/01/2012  11:08 AM   Other:     Other:      Scribe for Treatment Team:   Wynn Banker, LCSW,  03/01/2012 11:08 AM

## 2012-03-01 NOTE — Discharge Summary (Signed)
Physician Discharge Summary Note  Patient:  Zachary [name] is an 71 y.o., male MRN:  [id number] DOB:  [birth year] Patient phone:  [phone] (home)  Patient address:   [address] [place] [address],   Date of Admission:  02/25/2012 Date of Discharge: 03/01/12  Reason for Admission: Alcohol detox  Discharge Diagnoses: Principal Problem:  *Alcohol dependence with acute alcoholic intoxication   Axis Diagnosis:   AXIS I:  Alcohol dpendence with acute alcoholic intoxication. AXIS II:  Deferred AXIS III:   Past Medical History  Diagnosis Date  . Hypertension   . Coronary artery disease   . Hyperlipemia   . No pertinent past medical history   . Angina   . Shortness of breath   . Mental disorder   . Arthritis   . COPD (chronic obstructive pulmonary disease)   . Hiatal hernia   . Depression   . Gout    AXIS IV:  other psychosocial or environmental problems AXIS V:  61-70 mild symptoms  Level of Care:  OP  Hospital Course:  This is a 72 year old Caucasian male who is a walk-in admission to [facility] with complaints of alcohol abuse/dependence requesting detox treatment. Patient reports, "I'm here because I drink too much alcohol. I like the test of beer and I have been drinking since I was 71 years old. I started drinking at a very young age because we were making alcoholic beverage at that time. It is called moon shine. So, I got use to drinking alcohol and the rest is history. I know that I am an alcoholic, but I quit drinking and was sober x 10 years, then I went back to drinking. I was in this hospital 2 times in the past, this is my third time here. I decided to come back to get cleaned again because I was falling too much. I can hardly stand up".  While a patient in this hospital, patient was started on librium protocol for alcohol detox. He was also enrolled in group counseling and activities. He received medication management and monitoring for his other health conditions.  And because patient is not steady on his feet and posed a potential risk for falls and related injuries, he was provided with a wheel chair to assist with his mobility. Patient participated actively in group counseling and activities. He also participated in [place] meetings being offered in this unit. He did present with fine tremors on admission, and this stabilized prior to discharge . Patient tolerated his treatment regimen without any significant adverse effects and or reactions.  Around 05:45 this am 03/01/12, patient was observed on the floor in his room by a staff. He informed the staff that he fell while trying to reach his wheel chair. Patient admits bumping his head. Assessment conducted by staff per documentation reveals no obvious signs of injuries. Patient has been out and about within the unit today mobilizing while sitting up in a wheelchair. He remains alert, oriented x 4 and aware of situation. He denies any headaches, problems with memories, and or aches and pains. There is no obvious decrease in patient's functional levels at this time.  Patient attended treatment team meeting this am and agreed with the team that he is stable for discharge. To continue psychiatric care and maintain sobriety, patient will receive medication management at [facility] and substance abuse treatment at [facility] both in [place], [place]. Patient is aware that the [facility] appointment is a walk-in appointment on 03/04/12 between 08:00 am  and 03:00 PM. He has assessment appointment scheduled at [facility] residential on 03/11/12 at 08:00 am. The addresses, dates and times for these appointments provided for patient. Patient denies any suicidal, homicidal ideations, auditory and visual hallucinations. He left [facility] facility with all personal belongings vis taxi arranged transport in no apparent distress.  Consults:  None  Significant Diagnostic Studies:  None  Discharge Vitals:   Blood pressure 110/70, pulse 53,  temperature 97.4 F (36.3 C), temperature source Oral, resp. rate 16, height 5\' 3"  (1.6 m), weight 65.318 kg (144 lb), SpO2 97.00%.  Mental Status Exam: See Mental Status Examination and Suicide Risk Assessment completed by Attending Physician prior to discharge.  Discharge destination:  Home  Is patient on multiple antipsychotic therapies at discharge:  No   Has Patient had three or more failed trials of antipsychotic monotherapy by history:  No  Recommended Plan for Multiple Antipsychotic Therapies: NA   Medication List  As of 03/01/2012  2:07 PM   ASK your doctor about these medications      Indication    allopurinol 300 MG tablet   Commonly known as: ZYLOPRIM   Take 300 mg by mouth 2 (two) times daily.       celecoxib 200 MG capsule   Commonly known as: CELEBREX   Take 200 mg by mouth 2 (two) times daily.       clopidogrel 75 MG tablet   Commonly known as: PLAVIX   Take 75 mg by mouth daily.       colchicine 0.6 MG tablet   Take 0.6 mg by mouth daily.       DEXILANT 60 MG capsule   Generic drug: dexlansoprazole   Take 60 mg by mouth daily.       DULoxetine 30 MG capsule   Commonly known as: CYMBALTA   Take 30 mg by mouth daily.       gabapentin 300 MG capsule   Commonly known as: NEURONTIN   Take 300 mg by mouth 3 (three) times daily.       isosorbide mononitrate 30 MG 24 hr tablet   Commonly known as: IMDUR   Take 30 mg by mouth daily.       metoprolol tartrate 25 MG tablet   Commonly known as: LOPRESSOR   Take 25 mg by mouth 2 (two) times daily.       montelukast 10 MG tablet   Commonly known as: SINGULAIR   Take 10 mg by mouth at bedtime.       olmesartan-hydrochlorothiazide 20-12.5 MG per tablet   Commonly known as: BENICAR HCT   Take 1 tablet by mouth daily.       potassium chloride 10 MEQ CR tablet   Commonly known as: KLOR-CON   Take 10 mEq by mouth.       thiamine 100 MG tablet   Take 1 tablet (100 mg total) by mouth daily.        tiotropium 18 MCG inhalation capsule   Commonly known as: SPIRIVA   Place 18 mcg into inhaler and inhale daily.       traZODone 50 MG tablet   Commonly known as: DESYREL   Take 50 mg by mouth at bedtime.            Follow-up Information    Follow up with [facility] . (Please go to [facility] walk-in clinic any day Monday, March 04, 2012 between 8:00 a.m.-3:00 p.m. for medication management)    Contact information:  [address] [place], [place]  [address]  [phone]      Follow up with [facility]  on 03/11/2012. (You are scheduled for an admission assessment on Monday, March 11, 2012 at 8:00 a.m.   Please call Roe Coombs at [facility] at [phone] if assistance with transportation needed)    Contact information:   [address] [place], [place]  [address]  [phone]         Follow-up recommendations:  Other:  Keep all scheduled follow-up appointments as recommended.  Comments:  Take all medications as prescribed.                       Report any adverse effects of medications to your outpatient provider promptly.  SignedArmandina Stammer I 03/01/2012, 2:07 PM

## 2012-03-01 NOTE — Progress Notes (Signed)
[facility] Adult Inpatient Family/Significant Other Suicide Prevention Education  Suicide Prevention Education:  Contact Attempts: Zachary [name] and Zachary [name], friends, [phone]), have been identified by the patient as the family member/significant other with whom the patient will be residing, and identified as the person(s) who will aid the patient in the event of a mental health crisis.  With written consent from the patient, two attempts were made to provide suicide prevention education, prior to and/or following the patient's discharge.  We were unsuccessful in providing suicide prevention education.  A suicide education pamphlet was given to the patient to share with family/significant other.  Date and time of first attempt: 03/01/12 @ 11:40am Date and time of second attempt: 03/01/12 @ 1:08pm  *At the second call, counselor was told that this was the wrong number. Counselor checked the number with the person who answered to ensure she did not misdial. Counselor then checked with Zachary [name], who stated this is the only number he has and that it is right. Counselor provided [employer] with suicide prevention pamphlet, which he accepted though he stated he has never been suicidal. The intent of this call was not only to share suicide prevention information, but also to ensure that the people with whom Zachary [name] lives are aware of his upcoming appointment for admission to [facility].  Zachary [name] 03/01/2012, 11:40 AM

## 2012-03-01 NOTE — Progress Notes (Signed)
[facility] Group Notes:  (Counselor/Nursing/MHT/Case Management/Adjunct)  03/01/2012 12:02 PM  Type of Therapy:  Group Therapy  Participation Level:  Did Not Attend  Luanna Cole 03/01/2012, 12:02 PM

## 2012-03-01 NOTE — Progress Notes (Signed)
Patient ID: Zachary [name], male   DOB: [birth year], 71 y.o.   MRN: [id number] Pt. ambulating around the hall in a W/C w/o difficulty.  Denies any problems today: tolerating meds well, appetite good and interacts well with staff and peers.

## 2012-03-01 NOTE — Progress Notes (Signed)
Patient ID: Zachary [name], male   DOB: 01-27-1941, 71 y.o.   MRN: [id number]   Neuro vitals obtained on patient this am. Nothing out of the ordinary since fall. Patient denies any injury from fall on previous shift. Denies any depression or SI this am. Possible discharge today if patient has a place to go. Denies having any alcohol cravings today. Staff will continue to monitor and encourage group attendance.

## 2012-03-01 NOTE — Progress Notes (Addendum)
Pt interaction occurred at bedside for this pt. Pt was asleep during the start of shift through group. Pt vitals wnl this evening. Pt meds explained and administered to this pt at the bedside. HOB raised with body aligned to the center. Gatorade provided throughout the evening to hydrate pt. Pt still remains as a high fall risk with wheelchair at bedside for use. Continued support and availability as needed has been extended to this pt. Pt safety remains with q4min checks.   Pt found on floor by wheel chair by tech during 0545 check. Writer spoke with pt who reported getting out of bed and falling as he was reaching for his wheelchair. Pt said he bumped his head. Writer assessed head and found no bleeding, bruises are swelling at the time. Pt vitals were 115/70 pulse 69 while sitting up on the floor. 115/75 pulse 72 while sitting in wheelchair. Pt was escorted to the bathroom and clothes changed. Pt did urinate on himself in attempt to go to the bathroom by himself. Pt cbg was 77. Dr has been contacted and has been instructed to make sure pt receives fluid and breakfast. Neuro status to be rechecked after breakfast.

## 2012-03-01 NOTE — Progress Notes (Signed)
[facility] Group Notes:  (Counselor/Nursing/MHT/Case Management/Adjunct)    Type of Therapy:  Group Therapy  Participation Level:  Did Not Attend      Zachary [name] 03/01/2012  1:51 PM

## 2012-03-01 NOTE — Progress Notes (Signed)
[facility] Case Management Discharge Plan:  Will you be returning to the same living situation after discharge: Yes,  Patient will return home with friends At discharge, do you have transportation home?:Yes,  Patient can arrange transportation. Do you have the ability to pay for your medications:Yes,  Patient has private insurance  Interagency Information:     Release of information consent forms completed and in the chart;  Patient's signature needed at discharge.  Patient to Follow up at:  Follow-up Information    Follow up with [facility] . (Please go to [facility] walk-in clinic any day Monday, March 04, 2012 between 8:00 a.m.-3:00 p.m. for medication management)    Contact information:   [address] [place], [place]  [address]  [phone]      Follow up with [facility]  on 03/11/2012. (You are scheduled for an admission assessment on Monday, March 11, 2012 at 8:00 a.m.   Please call Roe Coombs at [facility] at [phone] if assistance with transportation needed)    Contact information:   [address] [place], [place]  [address]  [phone]         Patient denies SI/HI:   Yes,  Patient has not endorsed SI during hositalization    Safety Planning and Suicide Prevention discussed:  Yes,  Reviewed during d/c planning groups  Barrier to discharge identified:Yes,  Limited support; lapse in discharge date and admission to residential treatment  Summary and Recommendations:  Follow up with [facility] on 03/11/12   Wynn Banker 03/01/2012, 11:02 AM

## 2012-03-02 NOTE — Progress Notes (Signed)
[facility] Group Notes:  (Counselor/Nursing/MHT/Case Management/Adjunct)  03/02/2012 1315  Type of Therapy:  Group Therapy  Participation Level:  Did Not Attend  Modes of Intervention:  Clarification, Problem-solving, Socialization and Support  [name], [name] 03/02/2012, 3:51 PM

## 2012-03-02 NOTE — Progress Notes (Signed)
Pt has been incontinent during this shift. Pt required staff attention of changing clothing and transferring to next bed. Pt has been placed as a do not admit due to his frequent incontinent episodes noted throughout the day. Pt was cleaned and a diaper was placed on pt to keep skin dry. Pt has a slow response time to many staff questions. Pt is aware that he is in the hospital but calls it [facility]. Pt discussed with Clinical research associate his 10 years of sobriety 10 yr ago. Pt wishes to be sober for 20 yrs. Writer encouraged pt to stay alcohol free in order to save his health and to prevent further complications.

## 2012-03-02 NOTE — Progress Notes (Signed)
Pt in bed this morning.stating that he was very tired. Did get up mid morning and attempted to transfer to his wheel chair. He started to fall, but was caught, and transferred to the wheelchair without issues. Pt. Was using humor to ask for another wheelchair "one that has wheels so I can move it...this one I am using is for someone who doesn't have arms. There are no wheels to push it". Pt continues to be incontinent and appears disheveled. Given support. Denies SI and HI and rates his depression and hopelessness as a 1. States hie is not depressed or hopeless.

## 2012-03-02 NOTE — Progress Notes (Addendum)
[facility] MD Progress Note  03/02/2012 8:20 PM  S: "I feel fine. Slept well. My mood is pretty good"    O:    Patient observed lying in bed during this assessment. He denies any signs of pain and or         injuries from recent fall. Appeared to be in no apparent distress.  Diagnosis:   Axis I: Alcohol dependence with acute intoxication. Axis II: Deferred Axis III:  Past Medical History  Diagnosis Date  . Hypertension   . Coronary artery disease   . Hyperlipemia   . No pertinent past medical history   . Angina   . Shortness of breath   . Mental disorder   . Arthritis   . COPD (chronic obstructive pulmonary disease)   . Hiatal hernia   . Depression   . Gout    Axis IV: No changes Axis V: 60  ADL's:  Intact  Sleep: Good  Appetite:  Good  Suicidal Ideation:  Plan:  No Intent:  No Means:  No Homicidal Ideation:  Plan:  No Intent:  No Means:  No  AEB (as evidenced by):  Mental Status Examination/Evaluation: Objective:  Appearance: Casual  Eye Contact::  Good  Speech:  Clear and Coherent  Volume:  Normal  Mood:  Euthymic  Affect:  Appropriate  Thought Process:  Coherent and Intact  Orientation:  Full  Thought Content:  Rumination  Suicidal Thoughts:  No  Homicidal Thoughts:  No  Memory:  Immediate;   Good Recent;   Good Remote;   Fair  Judgement:  Fair  Insight:  Fair  Psychomotor Activity:  Tremor  Concentration:  Good  Recall:  Good  Akathisia:  No  Handed:  Right  AIMS (if indicated):     Assets:  Desire for Improvement  Sleep:  Number of Hours: 5    Vital Signs:Blood pressure 110/72, pulse 68, temperature 97 F (36.1 C), temperature source Oral, resp. rate 16, height 5\' 3"  (1.6 m), weight 65.318 kg (144 lb), SpO2 97.00%. Current Medications: Current Facility-Administered Medications  Medication Dose Route Frequency Provider Last Rate Last Dose  . acamprosate (CAMPRAL) tablet 666 mg  666 mg Oral TID WC Mike Craze, MD   666 mg at 03/02/12 1723    . acetaminophen (TYLENOL) tablet 650 mg  650 mg Oral Q6H PRN Verne Spurr, PA-C   650 mg at 02/28/12 0650  . allopurinol (ZYLOPRIM) tablet 300 mg  300 mg Oral BID Sanjuana Kava, NP   300 mg at 03/02/12 1723  . alum & mag hydroxide-simeth (MAALOX/MYLANTA) 200-200-20 MG/5ML suspension 30 mL  30 mL Oral Q4H PRN Verne Spurr, PA-C   30 mL at 02/28/12 2219  . celecoxib (CELEBREX) capsule 200 mg  200 mg Oral BID Sanjuana Kava, NP   200 mg at 03/02/12 1723  . clopidogrel (PLAVIX) tablet 75 mg  75 mg Oral Q breakfast Sanjuana Kava, NP   75 mg at 03/02/12 0939  . colchicine tablet 0.6 mg  0.6 mg Oral Daily Mike Craze, MD   0.6 mg at 03/02/12 0942  . DULoxetine (CYMBALTA) DR capsule 30 mg  30 mg Oral Daily Sanjuana Kava, NP   30 mg at 03/02/12 0942  . gabapentin (NEURONTIN) capsule 300 mg  300 mg Oral TID Sanjuana Kava, NP   300 mg at 03/02/12 1723  . hydrochlorothiazide (MICROZIDE) capsule 12.5 mg  12.5 mg Oral Daily Sanjuana Kava, NP   12.5  mg at 03/02/12 0940  . isosorbide mononitrate (IMDUR) 24 hr tablet 30 mg  30 mg Oral Daily Sanjuana Kava, NP   30 mg at 03/02/12 1610  . magnesium hydroxide (MILK OF MAGNESIA) suspension 30 mL  30 mL Oral Daily PRN Verne Spurr, PA-C      . metoprolol tartrate (LOPRESSOR) tablet 25 mg  25 mg Oral BID Sanjuana Kava, NP   25 mg at 03/02/12 1723  . montelukast (SINGULAIR) tablet 10 mg  10 mg Oral QHS Mike Craze, MD   10 mg at 03/01/12 2108  . mulitivitamin with minerals tablet 1 tablet  1 tablet Oral Daily Verne Spurr, PA-C   1 tablet at 03/02/12 [phone]  . pantoprazole (PROTONIX) EC tablet 40 mg  40 mg Oral BID AC Mike Craze, MD   40 mg at 03/02/12 1723  . potassium chloride (K-DUR,KLOR-CON) CR tablet 10 mEq  10 mEq Oral BID Sanjuana Kava, NP   10 mEq at 03/02/12 1723  . thiamine (VITAMIN B-1) tablet 100 mg  100 mg Oral Daily Verne Spurr, PA-C   100 mg at 03/02/12 0940  . tiotropium (SPIRIVA) inhalation capsule 18 mcg  18 mcg Inhalation Daily Sanjuana Kava, NP   18 mcg at 03/02/12 0930  . traZODone (DESYREL) tablet 50 mg  50 mg Oral QHS Mike Craze, MD   50 mg at 03/01/12 2108    Lab Results:  Results for orders placed during the hospital encounter of 02/25/12 (from the past 48 hour(s))  GLUCOSE, CAPILLARY     Status: Normal   Collection Time   03/01/12  6:33 AM      Component Value Range Comment   Glucose-Capillary 77  70 - 99 (mg/dL)    Comment 1 Notify RN       Physical Findings: AIMS:  , ,  ,  ,    CIWA:  CIWA-Ar Total: 1  COWS:     Treatment Plan Summary: Daily contact with patient to assess and evaluate symptoms and progress in treatment Medication management  Plan: Continue current treatment plan.           Patient continue to use wheel chair to aid mobility.  Armandina Stammer I 03/02/2012, 8:20 PM

## 2012-03-03 NOTE — Progress Notes (Signed)
Pt  States that he is doing fine, however does not understand  Why he is so weak and having trouble ambulating. Does not follow his fluid restriction and has people bringing him drinks from other halls. Denies SI and HI. Was found on the floor by both the oncoming shift and the going off shift this evening. Pt states he was in his room, trying to change his clothing and lost his balance. Larey Seat and hit his head. Pt was alert and orient and stated that he did not hurt. Having difficulty staying on his knees and was lift by a nurse and a tech, cleaned off and put in bed. Oncoming nurse will handle the rest and call the physician. Given support and reassurance.

## 2012-03-03 NOTE — Progress Notes (Signed)
D: Pt did not attend wrap-up group this evening.  Pt was found in bathroom drinking from water faucet with wet shirt.  Underwriter asked what pt was doing, the pt replied," Nothing.  I have no cups in my room."  A:  Re-educated pt on 1200 restriction and support him.  R:  Pt is safe. Aundria Rud, Zyiah Withington L, MHT/NS

## 2012-03-03 NOTE — Progress Notes (Signed)
[facility] Group Notes:  (Counselor/Nursing/MHT/Case Management/Adjunct)  03/03/2012 1115AM  Type of Therapy:  Group Therapy  Participation Level:  Did Not Attend  Modes of Intervention:  Clarification, Problem-solving, Socialization and Support  [name], [name] 03/03/2012, 12:51 PM 

## 2012-03-03 NOTE — Progress Notes (Signed)
Asking for something to drink, but has had his 1200cc for the day, could not accommodate his wishes, explained this to him, was grumpy but accepted the answer, sitting on the edge of the bed, encouraged him to lie down and go back to sleep. No other complaints

## 2012-03-03 NOTE — Progress Notes (Signed)
[facility] MD Progress Note  03/03/2012 1:33 PM  S: I'm doing all right. Can't complain"  O: Patient is out of bed, sitting up in a wheel chair.      Denies any signs of pain and or discomforts.       No obvious signs of injuries.  Diagnosis:   Axis I: Alcohol dependence with acute alcholic intoxication Axis II: Deferred Axis III:  Past Medical History  Diagnosis Date  . Hypertension   . Coronary artery disease   . Hyperlipemia   . No pertinent past medical history   . Angina   . Shortness of breath   . Mental disorder   . Arthritis   . COPD (chronic obstructive pulmonary disease)   . Hiatal hernia   . Depression   . Gout    Axis IV: No changes Axis V: 60  ADL's:  Intact  Sleep: Good  Appetite:  Good  Suicidal Ideation:  Plan:  No Intent:  no Homicidal Ideation:  Plan:  No Intent:  No Means:  No  AEB (as evidenced by): Per patient's report  Mental Status Examination/Evaluation: Objective:  Appearance: Casual  Eye Contact::  Good  Speech:  Clear and Coherent  Volume:  Normal  Mood:  Euthymic  Affect:  Appropriate  Thought Process:  Coherent  Orientation:  Full  Thought Content:  WDL  Suicidal Thoughts:  No  Homicidal Thoughts:  No  Memory:  Immediate;   Good Recent;   Good Remote;   Fair  Judgement:  Fair  Insight:  Fair  Psychomotor Activity:  Normal  Concentration:  Good  Recall:  Fair  Akathisia:  No  Handed:  Right  AIMS (if indicated):     Assets:  Desire for Improvement  Sleep:  Number of Hours: 5    Vital Signs:Blood pressure 116/75, pulse 71, temperature 96.9 F (36.1 C), temperature source Oral, resp. rate 13, height 5\' 3"  (1.6 m), weight 65.318 kg (144 lb), SpO2 97.00%. Current Medications: Current Facility-Administered Medications  Medication Dose Route Frequency Provider Last Rate Last Dose  . acamprosate (CAMPRAL) tablet 666 mg  666 mg Oral TID WC Mike Craze, MD   666 mg at 03/03/12 4782  . acetaminophen (TYLENOL) tablet 650 mg   650 mg Oral Q6H PRN Verne Spurr, PA-C   650 mg at 02/28/12 0650  . allopurinol (ZYLOPRIM) tablet 300 mg  300 mg Oral BID Sanjuana Kava, NP   300 mg at 03/03/12 1109  . alum & mag hydroxide-simeth (MAALOX/MYLANTA) 200-200-20 MG/5ML suspension 30 mL  30 mL Oral Q4H PRN Verne Spurr, PA-C   30 mL at 02/28/12 2219  . celecoxib (CELEBREX) capsule 200 mg  200 mg Oral BID Sanjuana Kava, NP   200 mg at 03/03/12 1108  . clopidogrel (PLAVIX) tablet 75 mg  75 mg Oral Q breakfast Sanjuana Kava, NP   75 mg at 03/03/12 1108  . colchicine tablet 0.6 mg  0.6 mg Oral Daily Mike Craze, MD   0.6 mg at 03/03/12 1109  . DULoxetine (CYMBALTA) DR capsule 30 mg  30 mg Oral Daily Sanjuana Kava, NP   30 mg at 03/03/12 1108  . gabapentin (NEURONTIN) capsule 300 mg  300 mg Oral TID Sanjuana Kava, NP   300 mg at 03/03/12 1110  . hydrochlorothiazide (MICROZIDE) capsule 12.5 mg  12.5 mg Oral Daily Sanjuana Kava, NP   12.5 mg at 03/03/12 1107  . isosorbide mononitrate (IMDUR) 24 hr  tablet 30 mg  30 mg Oral Daily Sanjuana Kava, NP   30 mg at 03/03/12 1108  . magnesium hydroxide (MILK OF MAGNESIA) suspension 30 mL  30 mL Oral Daily PRN Verne Spurr, PA-C      . metoprolol tartrate (LOPRESSOR) tablet 25 mg  25 mg Oral BID Sanjuana Kava, NP   25 mg at 03/03/12 1110  . montelukast (SINGULAIR) tablet 10 mg  10 mg Oral QHS Mike Craze, MD   10 mg at 03/02/12 2216  . mulitivitamin with minerals tablet 1 tablet  1 tablet Oral Daily Verne Spurr, PA-C   1 tablet at 03/03/12 1108  . pantoprazole (PROTONIX) EC tablet 40 mg  40 mg Oral BID AC Mike Craze, MD   40 mg at 03/03/12 1109  . potassium chloride (K-DUR,KLOR-CON) CR tablet 10 mEq  10 mEq Oral BID Sanjuana Kava, NP   10 mEq at 03/03/12 1109  . thiamine (VITAMIN B-1) tablet 100 mg  100 mg Oral Daily Verne Spurr, PA-C   100 mg at 03/03/12 1109  . tiotropium (SPIRIVA) inhalation capsule 18 mcg  18 mcg Inhalation Daily Sanjuana Kava, NP   18 mcg at 03/03/12 1110  .  traZODone (DESYREL) tablet 50 mg  50 mg Oral QHS Mike Craze, MD   50 mg at 03/02/12 2216    Lab Results: No results found for this or any previous visit (from the past 48 hour(s)).  Physical Findings: AIMS:  , ,  ,  ,    CIWA:  CIWA-Ar Total: 1  COWS:     Treatment Plan Summary: Daily contact with patient to assess and evaluate symptoms and progress in treatment Medication management  Plan: Continue current treatment plan.  Armandina Stammer I 03/03/2012, 1:33 PM

## 2012-03-04 NOTE — Tx Team (Signed)
Interdisciplinary Treatment Plan Update (Adult)  Date:  03/04/2012  Time Reviewed:  10:56AM   Progress in Treatment: Attending groups: No, did not attend groups over the weekend or today Participating in groups: No Taking medication as prescribed:  Yes Tolerating medication: Yes Family/Significant othe contact made:  No, several attempts made but the number Michai has is incorrect Patient understands diagnosis: Yes Discussing patient identified problems/goals with staff:  Yes Medical problems stabilized or resolved: Yes Denies suicidal/homicidal ideation: Yes Issues/concerns per patient self-inventory:  No  Other:  New problem(s) identified: None  Reason for Continuation of Hospitalization: High potential for relapse Medical issues (weakness)  Interventions implemented related to continuation of hospitalization:  Medication stabilization, safety checks q 15 mins, group attendance  Additional comments:  Estimated length of stay:  Discharge Plan:  New goal(s):  Review of initial/current patient goals per problem list:   1. Goal(s): Detox from ETOH  Met: Yes  Target date: d/c  As evidenced by: Kennedy Bucker has completed detox and denies any acute withdrawal symptoms 2. Goal (s): Reduce depression  Met: Yes  Target date: d/c  As evidenced by: Kennedy Bucker is denying depression, rates at 1 today 3. Goal(s): Stabilize on medications  Met: Yes  Target date: d/c  As evidenced by: Dillan reports being stable on medications with no intolerable side effects 4. Goal (s) Refer for residential Treatment  .   Met: Yes   . Target date: D/c   . As evidenced by: Appointment in place for assessment interview at [facility] on April 8    Attendees: Patient:     Family:     Physician:  Dr Mickeal Skinner, MD 03/04/2012 10:56AM  Nursing:   Quintella Reichert, RN 03/04/2012 10:56AM  Case Manager:  Reyes Ivan, LCSWA 03/04/2012  10:56AM  Counselor:  Angus Palms, LCSW 03/04/2012  10:56AM    Other:  Serena Colonel, NP 03/04/2012  10:56AM  Other:  Neill Loft, Rn 03/04/2012  10:56AM  Other:     Other:      Scribe for Treatment Team:   [name], 03/04/2012 10:56AM

## 2012-03-04 NOTE — Progress Notes (Signed)
Patient ID: Zachary [name], male   DOB: Feb 24, 1941, 71 y.o.   MRN: [id number] Patient has been using wheelchair this morning to ambulate.  Patient has been encourage to use toilet, but patient refused stated he has used toilet.  Patient drank coffee this morning 240 cc and also one cup water while taking meds this morning, 120 cc.   On patient self inventory sheet, sleeps well, improving appetite, normal energy level, good attention span.  Rated depression and hopelessness 31.  No withdrawals.  Denied SI.  Stated his legs have hurt in past 24 hours.  Worst pain #1.  Plans to continue his meds after discharge.   Desires long term placement.  No information for staff.   Does not have discharge plans.   No problems taking his meds after discharge.

## 2012-03-04 NOTE — Progress Notes (Signed)
Recreation Therapy Group Note  Date: 03/04/2012        Time: 1145       Group Topic/Focus: Patient invited to participate in animal assisted therapy. Pets as a coping skill and responsibility were discussed.   Participation Level: Active  Participation Quality: Appropriate and Attentive  Affect: Appropriate  Cognitive: Appropriate and Oriented   Additional Comments: None. 

## 2012-03-04 NOTE — Progress Notes (Signed)
Per MHT, pt has put out more than since 1900 in urinal, and has had four large incontinent episodes. Pt has had no PO fluids on this 11p-7a shift, and previous RN reported that he had 50mL PO fluid with his HS meds. Pt has been non-compliant with fluid restriction at meals and snacks, asking for fluids from different staff members and other patients. Pt reminded of importance of fluid restriction by the RN and MHT. Pt does not respond. Presently wearing a diaper for precautions, as pt does not call for help when he stands to urinate and is unsteady on his feet. Charge nurse made aware of change in pt need. Pt remains A&Ox4 and VSS.

## 2012-03-04 NOTE — Progress Notes (Signed)
[facility] MD Progress Note  03/04/2012 4:50 PM  S: "I doing fine, just a little sleepy this morning"  O: Patient is out of bed and sitting up in a wheel chair.      Denies any discomforts. Is alert and oriented x 3.  Diagnosis:   Axis I: Alcohol dependent with acute alcoholic intoxication. Axis II: Deferred Axis III:  Past Medical History  Diagnosis Date  . Hypertension   . Coronary artery disease   . Hyperlipemia   . No pertinent past medical history   . Angina   . Shortness of breath   . Mental disorder   . Arthritis   . COPD (chronic obstructive pulmonary disease)   . Hiatal hernia   . Depression   . Gout    Axis IV: No changes Axis V: 61-70 mild symptoms  ADL's:  Intact  Sleep: Good  Appetite:  Good  Suicidal Ideation:  Plan:  No Intent:  No Means:  No Homicidal Ideation:  Plan:  No Intent:  No Means:  No  AEB (as evidenced by): per patient's report.  Mental Status Examination/Evaluation: Objective:  Appearance: Casual  Eye Contact::  Good  Speech:  Clear and Coherent  Volume:  Normal  Mood:  Euthymic  Affect:  Appropriate  Thought Process:  Coherent  Orientation:  Full  Thought Content:  Rumination  Suicidal Thoughts:  No  Homicidal Thoughts:  No  Memory:  Immediate;   Good Recent;   Good Remote;   Good  Judgement:  Good  Insight:  Good  Psychomotor Activity:  Normal  Concentration:  Good  Recall:  Good  Akathisia:  No  Handed:  Right  AIMS (if indicated):     Assets:  Desire for Improvement  Sleep:  Number of Hours: 5.5    Vital Signs:Blood pressure 114/71, pulse 68, temperature 97.1 F (36.2 C), temperature source Oral, resp. rate 16, height 5\' 3"  (1.6 m), weight 65.318 kg (144 lb), SpO2 97.00%. Current Medications: Current Facility-Administered Medications  Medication Dose Route Frequency Provider Last Rate Last Dose  . acamprosate (CAMPRAL) tablet 666 mg  666 mg Oral TID WC Mike Craze, MD   666 mg at 03/04/12 1605  . acetaminophen  (TYLENOL) tablet 650 mg  650 mg Oral Q6H PRN Verne Spurr, PA-C   650 mg at 02/28/12 0650  . allopurinol (ZYLOPRIM) tablet 300 mg  300 mg Oral BID Sanjuana Kava, NP   300 mg at 03/04/12 1605  . alum & mag hydroxide-simeth (MAALOX/MYLANTA) 200-200-20 MG/5ML suspension 30 mL  30 mL Oral Q4H PRN Verne Spurr, PA-C   30 mL at 02/28/12 2219  . celecoxib (CELEBREX) capsule 200 mg  200 mg Oral BID Sanjuana Kava, NP   200 mg at 03/04/12 1604  . clopidogrel (PLAVIX) tablet 75 mg  75 mg Oral Q breakfast Sanjuana Kava, NP   75 mg at 03/04/12 0811  . colchicine tablet 0.6 mg  0.6 mg Oral Daily Mike Craze, MD   0.6 mg at 03/04/12 0811  . DULoxetine (CYMBALTA) DR capsule 30 mg  30 mg Oral Daily Sanjuana Kava, NP   30 mg at 03/04/12 4098  . gabapentin (NEURONTIN) capsule 300 mg  300 mg Oral TID Sanjuana Kava, NP   300 mg at 03/04/12 1604  . hydrochlorothiazide (MICROZIDE) capsule 12.5 mg  12.5 mg Oral Daily Sanjuana Kava, NP   12.5 mg at 03/04/12 1191  . isosorbide mononitrate (IMDUR) 24 hr tablet  30 mg  30 mg Oral Daily Sanjuana Kava, NP   30 mg at 03/04/12 0813  . magnesium hydroxide (MILK OF MAGNESIA) suspension 30 mL  30 mL Oral Daily PRN Verne Spurr, PA-C      . metoprolol tartrate (LOPRESSOR) tablet 25 mg  25 mg Oral BID Sanjuana Kava, NP   25 mg at 03/04/12 1603  . montelukast (SINGULAIR) tablet 10 mg  10 mg Oral QHS Mike Craze, MD   10 mg at 03/03/12 2119  . mulitivitamin with minerals tablet 1 tablet  1 tablet Oral Daily Verne Spurr, PA-C   1 tablet at 03/04/12 0813  . pantoprazole (PROTONIX) EC tablet 40 mg  40 mg Oral BID AC Mike Craze, MD   40 mg at 03/04/12 1603  . potassium chloride (K-DUR,KLOR-CON) CR tablet 10 mEq  10 mEq Oral BID Sanjuana Kava, NP   10 mEq at 03/04/12 1602  . thiamine (VITAMIN B-1) tablet 100 mg  100 mg Oral Daily Verne Spurr, PA-C   100 mg at 03/04/12 1610  . tiotropium (SPIRIVA) inhalation capsule 18 mcg  18 mcg Inhalation Daily Sanjuana Kava, NP   18 mcg at  03/04/12 0815  . traZODone (DESYREL) tablet 50 mg  50 mg Oral QHS Mike Craze, MD   50 mg at 03/03/12 2120    Lab Results: No results found for this or any previous visit (from the past 48 hour(s)).  Physical Findings: AIMS:  , ,  ,  ,    CIWA:  CIWA-Ar Total: 2  COWS:     Treatment Plan Summary: Daily contact with patient to assess and evaluate symptoms and progress in treatment Medication management  Plan: Continue current treatment plan.  Armandina Stammer I 03/04/2012, 4:50 PM

## 2012-03-04 NOTE — Progress Notes (Signed)
[facility] Group Notes:  (Counselor/Nursing/MHT/Case Management/Adjunct) 03/04/2012  1:15PM Value-driven Life   Type of Therapy:  Group Therapy  Participation Level:  Did Not Attend      Zachary [name] 03/04/2012  4:02 PM

## 2012-03-04 NOTE — Progress Notes (Signed)
1900   1:1 started on patient for fall risk.  MHT found pt in bathroom, unsteady, transferring from wheelchair to toilet.  MHT, nurse discussed with charge nurse and [facility].   Dr. Ferol Luz called and 1:1 initiated.  Dr. Dan Humphreys to evaluate tomorrow.   Patient denied SI and HI.   Denied A/V hallucinations.   Denied pain.  Respirations even and unlabored.  No signs of distress or pain noted on patient's face or body movements.   Patient is safe at this time in wheelchair with 1:1 present.   Will continue to monitor for safety per MD order.

## 2012-03-05 NOTE — Progress Notes (Signed)
Patient ID: Zachary [name], male   DOB: 01-16-1941, 72 y.o.   MRN: [id number] Pt. Says "I don't have none of those things" when writer asks about depression, SHI, and anxiety. "You see my problem is I drink alcohol like y'all drink pepsi." Pt. In bed sitter at bedside, will continue 1:1 for safety.

## 2012-03-05 NOTE — Progress Notes (Addendum)
1630   Patient stated he cannot find a way home.  Plans to go to [facility] in [place] after discharge from [facility].   Denied SI/HI.   Denied A/V hallucinations.   Denied pain.   Has been to groups this afternoon.   Using wheelchair to ambulate.  1:1 present for safety.   Respirations even and unlabored.  No signs of pain/distress noted on patient's face or body movements.  Will continue 1:1 for safety per MD order. 1900    Patient denied SI and HI.   Denied A/V hallucinations.   Denied pain.   1:1 continues for safety.  Safety has been maintained throughout the day.  Respirations even and unlabored.  No signs of pain or distress noted on patient's face or body movements.  1:1 continues per MD order.

## 2012-03-05 NOTE — Progress Notes (Addendum)
[facility] Group Notes: (Counselor/Nursing/MHT/Case Management/Adjunct) 03/05/2012   @1 :15pm Self-labeling   Type of Therapy:  Group Therapy  Participation Level:  Good  Participation Quality:  Attentive, Sharing, Appropriate   Affect:  blunted  Cognitive:  Appropriate  Insight:  Good  Engagement in Group: Good  Engagement in Therapy:  Good  Modes of Intervention:  Support and Exploration  Summary of Progress/Problems: Zachary [name] participated in labeling exercise. 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 He shared that the labels that are put on people do not matter as much as how people see themselves.  [name] 03/05/2012 2:26 PM

## 2012-03-05 NOTE — Progress Notes (Addendum)
Patient ID: Zachary [name], male   DOB: 1941/06/11, 71 y.o.   MRN: [id number] Patient's self inventory form, sleeps well, good appetite, normal energy level, good attention span.   No depression, no hopelessness.  No withdrawals.   Denied SI.  Unsteady on his feet, cough.  Quit drinking, find AA meeting to go to.  No questions or staff.   No problems taking meds after discharge. 1050  Patient has 1:1 present.  Using wheelchair to ambulate.  Recording input and output, sheet on door.  Denied SI and HI.   Denied A/V hallucinations.   Denied pain.  1:1 continues for safety.  Will continue to monitor for safety with 1:1. 1400   Patient denied SI and HI.   Denied A/V hallucinations.   Denied pain.  Patient ate lunch in day room with 1:1 present.  No signs of pain or distress noted.  Respirations even and unlabored.  Patient has attended groups with 1:1 present.   Will continue to monitor patient for safety with 1:1 per MD order.

## 2012-03-05 NOTE — Progress Notes (Signed)
Grief and Loss Group  Facilitated grief and loss group on [address] [facility]. Discussed group rules and privacy/confidentiality. Discussed types of loss (re: death, relationships, self-respect) and grief reactions to loss (re: sadness, depression, anger, isolation). Group was mostly quiet, minor progessive engagement. Group members interacted mostly w/ coun rather than one another despite attempts to redirect and encourage open sharing.  Pt was active in group, shared that he had lost 4 brothers and sisters recently and that he and sister were the only ones left in family. Pt stated that he regularly dreams about memories from [place]. Pt stated that he often feels sadness when thinking about loss, but his dreams are often happy. Pt also stated that he will dream about ppl/things on his mind in vivid detail even if he does not know who they are.  Lissett Favorite B MS, LPCA, NCC

## 2012-03-05 NOTE — BH Assessment (Signed)
Pt is brought for interview in wheelchair; says legs are weak and shaky - no reason given.  He says he is here for detox from alcohol   He has been drinking 4 40 oz beer a day.  He says he is beginning to realize what he has given up by drinking so much.  He says he lives with a woman, daughter and two gddaughters. [not states whether he is related.  He is moderately neat, has good eye contact denies SIHI today  Denies AHVH-smiles  He is cognitively intact but his processing is slow; has to think a bit before he answers.  His speech is clear and organized.  His insight is improving and his judgment is better.  He has been sleeping well.Marland Kitchen He denies any symptoms of detox from alcohol.

## 2012-03-05 NOTE — Progress Notes (Signed)
[facility] Group Notes: (Counselor/Nursing/MHT/Case Management/Adjunct) 03/05/2012   @ 11:00 Feelings About Diagnosis  Type of Therapy:  Group Therapy  Participation Level:  Limited  Participation Quality:  Attentive    Affect:  Appropriate  Cognitive:  Appropriate  Insight:  Poor  Engagement in Group: Limited  Engagement in Therapy:  Limited  Modes of Intervention:  Support and Exploration  Summary of Progress/Problems: Zachary [name] shared that he believes he will be able to "beat" alcoholism once he decides what to do with his time.  He went on to say that to him recovery means living without drinking.   [name] 03/05/2012  2:03 PM

## 2012-03-05 NOTE — Progress Notes (Signed)
Pt attended discharge planning group and actively participated.  Pt presents with calm mood and affect.  Pt denies having depression, anxiety and SI.  Pt discussed his discharge plan to be to go to [facility] on 4/8, AA meetings and stay away from people that drink.  Pt states that he doesn't know when he will d/c and is waiting for the okay from the dr.  No further needs voiced by pt at this time.    Reyes Ivan, LCSWA 03/05/2012  11:13 AM

## 2012-03-06 MED ORDER — ALLOPURINOL 300 MG PO TABS: 300.0000 mg | ORAL_TABLET | Freq: Two times a day (BID) | ORAL | Status: DC

## 2012-03-06 MED ORDER — DEXLANSOPRAZOLE 60 MG PO CPDR: 60.0000 mg | DELAYED_RELEASE_CAPSULE | Freq: Every day | ORAL | Status: DC

## 2012-03-06 MED ORDER — COLCHICINE 0.6 MG PO TABS: 0.6000 mg | ORAL_TABLET | Freq: Every day | ORAL | Status: DC

## 2012-03-06 MED ORDER — GUAIFENESIN 100 MG/5ML PO SOLN
10.0000 mL | ORAL | Status: DC | PRN
  Administered 2012-03-06: 200 mg via ORAL

## 2012-03-06 MED ORDER — METOPROLOL TARTRATE 25 MG PO TABS: 25.0000 mg | ORAL_TABLET | Freq: Two times a day (BID) | ORAL | Status: DC

## 2012-03-06 MED ORDER — THIAMINE HCL 100 MG PO TABS: 100.0000 mg | ORAL_TABLET | Freq: Every day | ORAL | Status: DC

## 2012-03-06 MED ORDER — HYDROCHLOROTHIAZIDE 12.5 MG PO CAPS: 12.5000 mg | ORAL_CAPSULE | Freq: Every day | ORAL | Status: DC

## 2012-03-06 MED ORDER — DULOXETINE HCL 30 MG PO CPEP: 30.0000 mg | ORAL_CAPSULE | Freq: Every day | ORAL | Status: DC

## 2012-03-06 MED ORDER — MONTELUKAST SODIUM 10 MG PO TABS: 10.0000 mg | ORAL_TABLET | Freq: Every day | ORAL | Status: DC

## 2012-03-06 MED ORDER — ACAMPROSATE CALCIUM 333 MG PO TBEC: 666.0000 mg | DELAYED_RELEASE_TABLET | Freq: Three times a day (TID) | ORAL | Status: AC

## 2012-03-06 MED ORDER — CLOPIDOGREL BISULFATE 75 MG PO TABS: 75.0000 mg | ORAL_TABLET | Freq: Every day | ORAL | Status: DC

## 2012-03-06 MED ORDER — POTASSIUM CHLORIDE 10 MEQ PO TBCR: 10.0000 meq | EXTENDED_RELEASE_TABLET | Freq: Two times a day (BID) | ORAL | Status: DC

## 2012-03-06 MED ORDER — CELECOXIB 200 MG PO CAPS: 200.0000 mg | ORAL_CAPSULE | Freq: Two times a day (BID) | ORAL | Status: DC

## 2012-03-06 MED ORDER — TRAZODONE HCL 50 MG PO TABS: 50.0000 mg | ORAL_TABLET | Freq: Every day | ORAL | Status: DC

## 2012-03-06 MED ORDER — ISOSORBIDE MONONITRATE ER 30 MG PO TB24: 30.0000 mg | ORAL_TABLET | Freq: Every day | ORAL | Status: DC

## 2012-03-06 MED ORDER — GABAPENTIN 300 MG PO CAPS: 300.0000 mg | ORAL_CAPSULE | Freq: Three times a day (TID) | ORAL | Status: DC

## 2012-03-06 MED ORDER — TIOTROPIUM BROMIDE MONOHYDRATE 18 MCG IN CAPS: 18.0000 ug | ORAL_CAPSULE | Freq: Every day | RESPIRATORY_TRACT | Status: DC

## 2012-03-06 NOTE — Progress Notes (Signed)
03/06/2012         Time: 1415      Group Topic/Focus: The focus of this group is on enhancing patients' problem solving skills, which involves identifying the problem, brainstorming solutions and choosing and trying a solution.   Participation Level: Active  Participation Quality: Attentive  Affect: Appropriate  Cognitive: Oriented  Additional Comments: None.   Zachary [name] 03/06/2012 3:42 PM 

## 2012-03-06 NOTE — Progress Notes (Addendum)
Pt resting in bed with eyes closed.  No distress observed.  Safety maintained with 1:1 observation.  Sitter at bedside

## 2012-03-06 NOTE — Progress Notes (Signed)
[facility] Case Management Discharge Plan:  Will you be returning to the same living situation after discharge: Yes,  Patient to return to friend's home At discharge, do you have transportation home?:No.  Patient assisted with a taxi voucher to friend's home Do you have the ability to pay for your medications:Yes,  Patient has insurance and disability income  Interagency Information:     Release of information consent forms completed and in the chart;  Patient's signature needed at discharge.  Patient to Follow up at:  Follow-up Information    Follow up with [facility]  on 03/07/2012. (Please go to [facility] walk-in clinic on Thursday, March 07, 2012 between 8:00 a.m.-3:00 p.m. for medication management)    Contact information:   [address] [place], [place]  [address]  [phone]      Follow up with [facility]  on 03/11/2012. (You are scheduled for an admission assessment on Monday, March 11, 2012 at 8:00 a.m.   Please call Roe Coombs at [facility] at [phone] if assistance with transportation needed)    Contact information:   [address] [place], [place]  [address]  [phone]         Patient denies SI/HI:   Yes,  Patient has not endorsed SI during hospitalization    Safety Planning and Suicide Prevention discussed:  Yes,  Suicide prevention education reviewed during discharge planning group with all patients   Barrier to discharge identified:Yes,  Unability to admit directly to residential treatment program  Summary and Recommendations:  Follow up with [facility] for an admission assessment on Monday, March 11, 2012   Wynn Banker 03/06/2012, 2:46 PM

## 2012-03-06 NOTE — Progress Notes (Addendum)
Patient ID: Zachary [name], male   DOB: [birth year], 71 y.o.   MRN: [id number] Pt. Awake, alert, NAD.  Disheveled appearance.  Slow to process.  1:1 sitter with him at all times, safety insured.  Denies SI/HI/AVH.

## 2012-03-06 NOTE — Progress Notes (Signed)
Patient ID: Zachary [name], male   DOB: March 04, 1941, 71 y.o.   MRN: [id number]   See 1:1notes
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
## 2012-03-06 NOTE — [facility] Suicide Risk Assessment (Signed)
Suicide Risk Assessment  Discharge Assessment     Demographic factors:  Age 71 or older;Divorced or widowed    Current Mental Status Per Nursing Assessment::   On Admission:    At Discharge:     Current Mental Status Per Physician:  Loss Factors: Decline in physical health  Historical Factors:    Risk Reduction Factors:      Continued Clinical Symptoms:  Alcohol/Substance Abuse/Dependencies Chronic Pain Previous Psychiatric Diagnoses and Treatments Medical Diagnoses and Treatments/Surgeries  Discharge Diagnoses:   AXIS I:  Alcohol Dependence with acute intoxication AXIS II:  Deferred AXIS III:   Past Medical History  Diagnosis Date  . Hypertension   . Coronary artery disease   . Hyperlipemia   . No pertinent past medical history   . Angina   . Shortness of breath   . Mental disorder   . Arthritis   . COPD (chronic obstructive pulmonary disease)   . Hiatal hernia   . Depression   . Gout    AXIS IV:  other psychosocial or environmental problems AXIS V:  51-60 moderate symptoms  Cognitive Features That Contribute To Risk:  Thought constriction (tunnel vision)    Suicide Risk:  Minimal: No identifiable suicidal ideation.  Patients presenting with no risk factors but with morbid ruminations; may be classified as minimal risk based on the severity of the depressive symptoms  ADL's:  Intact  Sleep: Good  Appetite:  Good  Suicidal Ideation:  Denies adamantly any suicidal thoughts. Homicidal Ideation:  Denies adamantly any homicidal thoughts.  Mental Status Examination/Evaluation: Objective:  Appearance: Casual  Eye Contact::  Good  Speech:  Clear and Coherent  Volume:  Normal  Mood:  Euthymic  Affect:  Congruent  Thought Process:  Coherent  Orientation:  Full  Thought Content:  WDL  Suicidal Thoughts:  No  Homicidal Thoughts:  No  Memory:  Immediate;   Good  Judgement:  Good  Insight:  Good  Psychomotor Activity:  Normal  Concentration:   Good  Recall:  Good  Akathisia:  No  AIMS (if indicated):     Assets:  Communication Skills Desire for Improvement Housing Physical Health Resilience Social Support  Sleep: Number of Hours: 6    Vital Signs: Blood pressure 127/83, pulse 62, temperature 97 F (36.1 C), temperature source Oral, resp. rate 18, height 5\' 3"  (1.6 m), weight 65.318 kg (144 lb), SpO2 97.00%.  Labs No results found for this or any previous visit (from the past 72 hour(s)).  RISK REDUCTION FACTORS: What pt has learned from hospital stay is "I shouldn't do what I did."  Essentially he has decided that he needs to work harder to stay clean and sober.  Risk of self harm is elevated by his addictions, but he now has his grand kids to live for.  Risk of harm to others is elevated by his past history of fighting, but he seems more willing to allow peace to exist around him now.  PLAN: Discharge home Continue Medication List  As of 03/06/2012  2:35 PM   STOP taking these medications         olmesartan-hydrochlorothiazide 20-12.5 MG per tablet         TAKE these medications         acamprosate 333 MG tablet   Commonly known as: CAMPRAL   Take 2 tablets (666 mg total) by mouth 3 (three) times daily with meals.      allopurinol 300 MG tablet   Commonly  known as: ZYLOPRIM   Take 1 tablet (300 mg total) by mouth 2 (two) times daily. For gout      celecoxib 200 MG capsule   Commonly known as: CELEBREX   Take 1 capsule (200 mg total) by mouth 2 (two) times daily. For arthritis      clopidogrel 75 MG tablet   Commonly known as: PLAVIX   Take 1 tablet (75 mg total) by mouth daily. For prevention of stroke and heart attack      colchicine 0.6 MG tablet   Take 1 tablet (0.6 mg total) by mouth daily. For gout      dexlansoprazole 60 MG capsule   Commonly known as: DEXILANT   Take 1 capsule (60 mg total) by mouth daily. For control of stomach acid secretion and helps GERD.      DULoxetine 30 MG capsule     Commonly known as: CYMBALTA   Take 1 capsule (30 mg total) by mouth daily. For depression and pain management.      gabapentin 300 MG capsule   Commonly known as: NEURONTIN   Take 1 capsule (300 mg total) by mouth 3 (three) times daily. For anxiety and management of pain.  May be helpful for sleep.      hydrochlorothiazide 12.5 MG capsule   Commonly known as: MICROZIDE   Take 1 capsule (12.5 mg total) by mouth daily. For control of high blood pressure      isosorbide mononitrate 30 MG 24 hr tablet   Commonly known as: IMDUR   Take 1 tablet (30 mg total) by mouth daily. To prevent chest pain.      metoprolol tartrate 25 MG tablet   Commonly known as: LOPRESSOR   Take 1 tablet (25 mg total) by mouth 2 (two) times daily. For control of high blood pressure      montelukast 10 MG tablet   Commonly known as: SINGULAIR   Take 1 tablet (10 mg total) by mouth at bedtime. For shortness of air.      potassium chloride 10 MEQ CR tablet   Commonly known as: KLOR-CON   Take 1 tablet (10 mEq total) by mouth 2 (two) times daily. For potassium replacement because the hydrochlorothiazide make you lose potassium      thiamine 100 MG tablet   Take 1 tablet (100 mg total) by mouth daily. For nutritional supplementation.      tiotropium 18 MCG inhalation capsule   Commonly known as: SPIRIVA   Place 1 capsule (18 mcg total) into inhaler and inhale daily. For shortness of air.      traZODone 50 MG tablet   Commonly known as: DESYREL   Take 1 tablet (50 mg total) by mouth at bedtime. For insomnia.           Follow-up recommendations: Activities: Resume typical activities Diet: Resume typical diet Other: Follow up with outpatient provider and report any side effects to out patient prescriber.  Zachary [name] 03/06/2012, 2:34 PM

## 2012-03-08 NOTE — Progress Notes (Signed)
Patient Discharge Instructions:  Psychiatric Admission Assessment Note Provided,  03/07/2012 After Visit Summary (AVS) Provided,  03/07/2012 Face Sheet Provided, 03/07/2012 Faxed/Sent to the Next Level Care provider:  03/07/2012 Sent Suicide Risk Assessment - Discharge Assessment 03/07/2012  Faxed to [facility] @ [phone] And to [facility] @ [phone]  Wandra Scot, 03/08/2012, 3:12 PM

## 2012-03-16 ENCOUNTER — Emergency Department (HOSPITAL_COMMUNITY)
Admission: EM | Admit: 2012-03-16 | Discharge: 2012-03-16 | Disposition: A | Discharge status: Home / Self Care | Payer: Medicare Other | Attending: Emergency Medicine | Admitting: Emergency Medicine

## 2012-03-16 ENCOUNTER — Encounter (HOSPITAL_COMMUNITY): Payer: Self-pay | Admitting: *Deleted

## 2012-03-16 ENCOUNTER — Emergency Department (HOSPITAL_COMMUNITY): Payer: Medicare Other

## 2012-03-16 DIAGNOSIS — Z79899 Other long term (current) drug therapy: Secondary | ICD-10-CM | POA: Insufficient documentation

## 2012-03-16 DIAGNOSIS — I2510 Atherosclerotic heart disease of native coronary artery without angina pectoris: Secondary | ICD-10-CM | POA: Insufficient documentation

## 2012-03-16 DIAGNOSIS — S0081XA Abrasion of other part of head, initial encounter: Secondary | ICD-10-CM

## 2012-03-16 DIAGNOSIS — R071 Chest pain on breathing: Secondary | ICD-10-CM | POA: Insufficient documentation

## 2012-03-16 DIAGNOSIS — I10 Essential (primary) hypertension: Secondary | ICD-10-CM | POA: Insufficient documentation

## 2012-03-16 DIAGNOSIS — R0602 Shortness of breath: Secondary | ICD-10-CM | POA: Insufficient documentation

## 2012-03-16 DIAGNOSIS — M5030 Other cervical disc degeneration, unspecified cervical region: Secondary | ICD-10-CM | POA: Insufficient documentation

## 2012-03-16 DIAGNOSIS — G319 Degenerative disease of nervous system, unspecified: Secondary | ICD-10-CM | POA: Insufficient documentation

## 2012-03-16 DIAGNOSIS — J449 Chronic obstructive pulmonary disease, unspecified: Secondary | ICD-10-CM | POA: Insufficient documentation

## 2012-03-16 DIAGNOSIS — J4489 Other specified chronic obstructive pulmonary disease: Secondary | ICD-10-CM | POA: Insufficient documentation

## 2012-03-16 DIAGNOSIS — IMO0002 Reserved for concepts with insufficient information to code with codable children: Secondary | ICD-10-CM | POA: Insufficient documentation

## 2012-03-16 DIAGNOSIS — F10229 Alcohol dependence with intoxication, unspecified: Secondary | ICD-10-CM

## 2012-03-16 DIAGNOSIS — R0789 Other chest pain: Secondary | ICD-10-CM

## 2012-03-16 DIAGNOSIS — W19XXXA Unspecified fall, initial encounter: Secondary | ICD-10-CM | POA: Insufficient documentation

## 2012-03-16 MED ORDER — GI COCKTAIL ~~LOC~~
ORAL | Status: AC
  Administered 2012-03-16: 30 mL via ORAL
  Filled 2012-03-16: qty 30

## 2012-03-16 MED ORDER — GI COCKTAIL ~~LOC~~
30.0000 mL | Freq: Once | ORAL | Status: AC
  Administered 2012-03-16: 30 mL via ORAL

## 2012-03-16 NOTE — ED Notes (Signed)
Pt to x-ray & CT 

## 2012-03-16 NOTE — ED Notes (Signed)
Not in room, in radiology. 

## 2012-03-16 NOTE — ED Notes (Signed)
Xray/CT here for pt, no changes, alert, NAD, calm, interactive, cooperative, polite, able to use call bell, into room multiple times to update pt & orient pt to plan, c-collar remains.

## 2012-03-16 NOTE — ED Notes (Signed)
Home number (off) and emergency contact number ("wrong number") not working. Unable to leave message or contact family.

## 2012-03-16 NOTE — ED Notes (Signed)
EDP at [facility], c-collar removed.

## 2012-03-16 NOTE — ED Notes (Signed)
Weekend MSW Note;   MSW received call from bedside RN requesting assistance with transportation home. Per chart review, pt is with long hx of etoh abuse and recently discharged from [facility]. Pt presented to [facility] via E[facility] s/p multiple falls at home. Per RN, pt is cleared for discharge. MSW reviewed EMR and placed call to pts identified support Ava/[name] @ [phone] and was only able to leave a vm. MSW placed call to home number @ [phone] for which is disconnected. MSW met with pt in his prvt room to asses. Pt engaged in conversation and stated he is not able to use public transportation 2/2 his 'slow movements' and states he is afraid he would fall. Pt reports he resides with two room-mates and that he has access to enter his home. Pt confirmed his address which matches the face sheet and reports to feel safe in his home. Pt reports his room mates are most likely sleeping. Pt was agreeable to hospital sponsored cab voucher and is aware this is a one time offer. MSW briefly addressed pt's etoh hx and encouraged pt to f/u with his appts via [employer] [facility] [employer] that were arranged while in [facility]. Pt stated he has an appt.on Monday 03/18/12 and has transportation via his room mates. Pt denied any further MSW interventions and voiced appreciation for the taxi cab. MSW placed call to [facility] [employer] @ [phone] and arranged for pick up at 0930. RN aware and agreeable.   Dionne Milo MSW  [facility]  Weekend/Social Worker  [phone]

## 2012-03-16 NOTE — ED Notes (Signed)
Called Child psychotherapist, Tracie regarding assistance with transportation home

## 2012-03-16 NOTE — ED Provider Notes (Signed)
History     CSN: [id number]  Arrival date & time 03/16/12  2725   First MD Initiated Contact with Patient 03/16/12 0345      Chief Complaint  Patient presents with  . Fall  . Altered Mental Status  . Alcohol Intoxication    (Consider location/radiation/quality/duration/timing/severity/associated sxs/prior treatment) HPI 71 year old male presents from home with report of multiple falls after heavy alcohol intake. Patient recently discharged from detox. No reported LOC. Patient with abrasions to face. Patient smells heavily of alcohol and appears to be intoxicated. Patient with varying complaints, complaining of pain over entire body during different parts of the exam and history taking. Patient is moving all extremities and does not appear to be in acute distress Past Medical History  Diagnosis Date  . Hypertension   . Coronary artery disease   . Hyperlipemia   . No pertinent past medical history   . Angina   . Shortness of breath   . Mental disorder   . Arthritis   . COPD (chronic obstructive pulmonary disease)   . Hiatal hernia   . Depression   . Gout     Past Surgical History  Procedure Date  . Abdominal surgery     No family history on file.  History  Substance Use Topics  . Smoking status: Former Smoker -- 1.0 packs/day for 15 years    Types: Cigarettes    Quit date: 10/12/2010  . Smokeless tobacco: Not on file  . Alcohol Use: 7.2 oz/week    12 Cans of beer per week     pt heavy drinker      Review of Systems  Unable to perform ROS  due to intoxication  Allergies  Review of patient's allergies indicates no known allergies.  Home Medications   Current Outpatient Rx  Name Route Sig Dispense Refill  . ACAMPROSATE CALCIUM 333 MG PO TBEC Oral Take 2 tablets (666 mg total) by mouth 3 (three) times daily with meals. 180 tablet 0  . ALLOPURINOL 300 MG PO TABS Oral Take 1 tablet (300 mg total) by mouth 2 (two) times daily. For gout 60 tablet 0  .  CELECOXIB 200 MG PO CAPS Oral Take 1 capsule (200 mg total) by mouth 2 (two) times daily. For arthritis 60 capsule 0  . CLOPIDOGREL BISULFATE 75 MG PO TABS Oral Take 1 tablet (75 mg total) by mouth daily. For prevention of stroke and heart attack 30 tablet 0  . COLCHICINE 0.6 MG PO TABS Oral Take 1 tablet (0.6 mg total) by mouth daily. For gout 30 tablet 0  . DEXLANSOPRAZOLE 60 MG PO CPDR Oral Take 1 capsule (60 mg total) by mouth daily. For control of stomach acid secretion and helps GERD. 30 capsule 0  . DULOXETINE HCL 30 MG PO CPEP Oral Take 1 capsule (30 mg total) by mouth daily. For depression and pain management. 30 capsule 0  . GABAPENTIN 300 MG PO CAPS Oral Take 1 capsule (300 mg total) by mouth 3 (three) times daily. For anxiety and management of pain.  May be helpful for sleep. 90 capsule 0  . HYDROCHLOROTHIAZIDE 12.5 MG PO CAPS Oral Take 1 capsule (12.5 mg total) by mouth daily. For control of high blood pressure 30 capsule 0  . ISOSORBIDE MONONITRATE ER 30 MG PO TB24 Oral Take 1 tablet (30 mg total) by mouth daily. To prevent chest pain. 30 tablet 0  . METOPROLOL TARTRATE 25 MG PO TABS Oral Take 1 tablet (25 mg total)  by mouth 2 (two) times daily. For control of high blood pressure 60 tablet 0  . MONTELUKAST SODIUM 10 MG PO TABS Oral Take 1 tablet (10 mg total) by mouth at bedtime. For shortness of air. 30 tablet 0  . POTASSIUM CHLORIDE 10 MEQ PO TBCR Oral Take 1 tablet (10 mEq total) by mouth 2 (two) times daily. For potassium replacement because the hydrochlorothiazide make you lose potassium 60 tablet 0  . THIAMINE HCL 100 MG PO TABS Oral Take 1 tablet (100 mg total) by mouth daily. For nutritional supplementation. 30 tablet 0    FOR nutritional recovery  . TIOTROPIUM BROMIDE MONOHYDRATE 18 MCG IN CAPS Inhalation Place 1 capsule (18 mcg total) into inhaler and inhale daily. For shortness of air. 30 capsule 0  . TRAZODONE HCL 50 MG PO TABS Oral Take 1 tablet (50 mg total) by mouth at  bedtime. For insomnia. 30 tablet 0    BP 109/68  Pulse 81  Temp(Src) 98.6 F (37 C) (Oral)  Resp 20  SpO2 93%  Physical Exam  Nursing note and vitals reviewed. Constitutional: He appears well-developed and well-nourished.       Appears younger than stated age, disheveled  HENT:  Head: Normocephalic.  Right Ear: External ear normal.  Left Ear: External ear normal.  Nose: Nose normal.  Mouth/Throat: Oropharynx is clear and moist.       Abrasion to Center forehead and right lateral forehead bleeding controlled  Eyes: Conjunctivae and EOM are normal. Pupils are equal, round, and reactive to light.  Neck: Normal range of motion. Neck supple. No JVD present. No tracheal deviation present. No thyromegaly present.  Cardiovascular: Normal rate, regular rhythm, normal heart sounds and intact distal pulses.  Exam reveals no gallop and no friction rub.   No murmur heard. Pulmonary/Chest: Effort normal and breath sounds normal. No stridor. No respiratory distress. He has no wheezes. He has no rales. He exhibits tenderness (tenderness across anterior chest wall without crepitus or deformity noted).  Abdominal: Soft. Bowel sounds are normal. He exhibits no distension and no mass. There is no tenderness. There is no rebound and no guarding.  Musculoskeletal: Normal range of motion. He exhibits no edema and no tenderness.  Lymphadenopathy:    He has no cervical adenopathy.  Neurological: He has normal reflexes. No cranial nerve deficit. He exhibits normal muscle tone. Coordination (mild ataxia and movement disorder secondary to alcohol intoxication) abnormal.       Oriented to self and that he is in the hospital  Skin: Skin is dry. No rash noted. No erythema. No pallor.    ED Course  Procedures (including critical care time)  Labs Reviewed - No data to display Dg Chest 2 View  03/16/2012  *RADIOLOGY REPORT*  Clinical Data: Fall.  Left-sided chest pain.  Shortness of breath.  CHEST - 2 VIEW   Comparison: 11/10/2011  Findings: Pulmonary inflation is again seen, consistent with COPD. Both lungs are clear.  No evidence of pneumothorax or hemothorax. Heart size is within normal limits.  No mass or lymphadenopathy identified.  No evidence of mediastinal widening or tracheal deviation.  IMPRESSION: Stable exam.  COPD.  No active disease.  Original Report Authenticated By: Danae Orleans, M.D.   Ct Head Wo Contrast  03/16/2012  *RADIOLOGY REPORT*  Clinical Data:  Fall. Head injury.  Altered level of consciousness. Intoxication.  CT HEAD WITHOUT CONTRAST CT CERVICAL SPINE WITHOUT CONTRAST  Technique:  Multidetector CT imaging of the head and cervical  spine was performed following the standard protocol without intravenous contrast.  Multiplanar CT image reconstructions of the cervical spine were also generated.  Comparison:   None  CT HEAD  Findings: There is no evidence of intracranial hemorrhage, brain edema or other signs of acute infarction.  There is no evidence of intracranial mass lesion or mass effect.  No abnormal extra-axial fluid collections are identified.  Moderate to severe cerebral atrophy is again demonstrated. Ventricles are stable in size.  No evidence of skull fracture or other significant bone abnormality.  IMPRESSION:  1.  No acute intracranial abnormality. 2.  Stable diffuse cerebral atrophy.  CT CERVICAL SPINE  Findings: No evidence of acute cervical spine fracture or subluxation.  Degenerative disc disease is seen in all cervical levels, which is most severe from C5-T1.  Bilateral facet DJD is also seen which is most severe on the right at the C2-3 and C3-4.  IMPRESSION:  1.  No evidence of cervical spine fracture or subluxation. 2.  Degenerative cervical spondylosis, as described above.  Original Report Authenticated By: Danae Orleans, M.D.   Ct Cervical Spine Wo Contrast  03/16/2012  *RADIOLOGY REPORT*  Clinical Data:  Fall. Head injury.  Altered level of consciousness.  Intoxication.  CT HEAD WITHOUT CONTRAST CT CERVICAL SPINE WITHOUT CONTRAST  Technique:  Multidetector CT imaging of the head and cervical spine was performed following the standard protocol without intravenous contrast.  Multiplanar CT image reconstructions of the cervical spine were also generated.  Comparison:   None  CT HEAD  Findings: There is no evidence of intracranial hemorrhage, brain edema or other signs of acute infarction.  There is no evidence of intracranial mass lesion or mass effect.  No abnormal extra-axial fluid collections are identified.  Moderate to severe cerebral atrophy is again demonstrated. Ventricles are stable in size.  No evidence of skull fracture or other significant bone abnormality.  IMPRESSION:  1.  No acute intracranial abnormality. 2.  Stable diffuse cerebral atrophy.  CT CERVICAL SPINE  Findings: No evidence of acute cervical spine fracture or subluxation.  Degenerative disc disease is seen in all cervical levels, which is most severe from C5-T1.  Bilateral facet DJD is also seen which is most severe on the right at the C2-3 and C3-4.  IMPRESSION:  1.  No evidence of cervical spine fracture or subluxation. 2.  Degenerative cervical spondylosis, as described above.  Original Report Authenticated By: Danae Orleans, M.D.    Date: 03/16/2012  Rate: 81  Rhythm: normal sinus rhythm  QRS Axis: normal  Intervals: normal  ST/T Wave abnormalities: nonspecific T wave changes  Conduction Disutrbances:none  Narrative Interpretation:   Old EKG Reviewed: unchanged   1. Alcohol dependence with acute alcoholic intoxication   2. Fall   3. Chest wall pain   4. Facial abrasion       MDM  71 year old male with alcohol intoxication and multiple falls. We'll get CT head C-spine for possible fractures or intracranial injury. We'll get chest x-ray as well given chest wall tenderness. We'll out of sober. Do not feel patient needs wound repair only cleaning and bacitracin. We'll DC  home if workup is negative        Olivia Mackie, MD 03/16/12 [phone]

## 2012-03-16 NOTE — ED Notes (Addendum)
MD at bedside to remove C-collar at this time.

## 2012-03-16 NOTE — ED Notes (Signed)
EDP Dr. Norlene Campbell at [facility], removed from LSB, log rolled with spinal precautions, c-collar applied, IV established, pt mentions bilateral knee pain, redness noted to bilateral knees. No other changes. VSS.

## 2012-03-16 NOTE — ED Notes (Signed)
Lying flat, supine, c-collar remains, NAD, calm, alert, interactive.

## 2012-03-16 NOTE — ED Notes (Signed)
Report received, assumed care.  

## 2012-03-16 NOTE — ED Notes (Signed)
Recent d/c from detox facility, has been drinking, here by EMS ([facility]) for ETOH and falls, fell x3 in the last hour, witnessed by family, no syncope reported, just poor balance, also mentions abd pain. Pt states, "don't feel too good", mentions: CP on arrival. Arrives on LSB with head blocks (no c-collar), facial abrasions/lac noted (forehead and nose).

## 2012-03-16 NOTE — Discharge Instructions (Signed)
Your evaluation in the ER today after your fall did not show any broken bones or serious injury.  Please be careful in the future when you are walking, getting out of bed or chair as you may fall again.  Follow up with your doctor for recheck in 2-3 days.  Return to the ER for worsening condition or new concerning symptoms.   FALL PREVENTION (EDU): You have requested information on Fall Prevention.  According to a 2003 study from the Journal of the [employer], more than 1.8 million adults, aged 34 and older, were treated in emergency departments for fall-related injuries. More than 421,000 were hospitalized. The most common injuries from a fall are head injuries that in turn cause a brain injury, and fractures (broken bones). Of all types of broken bones that happen from falls, hip fractures are the most serious and lead to the greatest number of health problems and deaths.  To make your living area safer, older adults should consider the following:      Improve lighting throughout the home. Use night-lights to help you see at night.     Have handrails installed on both sides of stairways.     Have grab bars placed next to the toilet and in the shower. Also consider an elevated toilet seat and a shower chair.     Use non-slip bath mats in the tub or shower.     Remove "throw rugs" to prevent tripping.     Avoid the use of long robes to prevent tripping.     Wear well-fitted shoes or slippers. Wearing loose footwear can cause you to shuffle, and make you more likely to trip and fall. Inexpensive anti-slip socks can also be purchased.     Be sure to keep all electrical cords and small objects out of the pathway.     If a cane, walker or any other assistive device is used, be sure to have them inspected regularly. The devices must be used correctly to prevent injuries.     Remember to move about at a pace that is comfortable for your ability. For example, do not rush to  answer the doorbell or the phone. Take your time. In recent studies, a number of risk factors have been identified that make older adults more likely to have falls. It has also been shown that when these risk factors are modified, it will help to prevent falls.      Exercise: Regular physical activity or exercise increases body strength and improves balance.     Medication Review: Follow up with your doctor and pharmacist as needed to review your medications and any recent changes that may have been made. They can tell you if there are drug interactions and side-effects. If you are taking any sedatives or sleeping pills, it might be possible to have the dosage decreased, or have the number of medications reduced. These kinds of medications can cause drowsiness and dizziness, thus posing a risk of falling.     Vision Checks: Follow up with an eye doctor at least once a year to have your vision checked.  If you develop symptoms of Shortness of Breath, Chest Pain, Swelling of lips, mouth or tongue or if your condition becomes worse with any new symptoms, see your doctor or return to the Emergency Department for immediate care. Emergency services are not intended to be a substitute for comprehensive medical attention.  Please contact your doctor for follow up if not improving as expected.  Call your doctor in 5-7 days or as directed if there is no improvement.   Community Resources: *IF YOU ARE IN IMMEDIATE DANGER CALL 911!  Abuse/Neglect:  Family Services Crisis Hotline [facility]): 236-033-1383 [facility]): 623-025-9733  After hours, holidays and weekends: 917-626-5132 National Domestic Violence Hotline: 716-202-1251  Mental Health: [facility]: Drucie Ip: [phone]  Health Clinics:  [facility]): [phone] Monday - Friday 8 AM - 9 PM, Saturday and Sunday 10 AM - 9 PM  Health  [facility] [place]: [phone] Monday - Friday 8 AM - 5 PM  [facility]  [address]: [phone] Monday- Friday 8:30 AM - 5:30 PM, Sat 9 AM - 1 PM  24 [facility] [address]: [phone] [facility] [address]: [phone] [facility] [address]: [phone]  24 [facility]: [address] [phone]  Cultures: If culture results are positive, we will notify you if a change in treatment is necessary.  LABORATORY TESTS:         If you had any labs drawn in the ED that have not resulted by the time you are discharged home, we will review these lab results and the treatment given to you.  If there is any further treatment or notification needed, we will contact you by phone, or letter.  "PLEASE ENSURE THAT YOU HAVE GIVEN US YOUR CURRENT WORKING PHONE NUMBER AND YOUR CURRENT ADDRESS, so that we can contact you if needed."  RADIOLOGY TESTS:  If the referred physician wants today\'s x-rays, please call the hospital\'s Radiology Department the day before your doctor\'s appointment. [place]     [phone] [place]   [phone] [place]     [phone]  Our doctors and staff appreciate your choosing [place] for your emergency medical care needs. We are here to serve you  Alcohol Intoxication Alcohol intoxication means your blood alcohol level is above legal limits. Alcohol is a drug. It has serious side effects. These side effects can include:  Damage to your organs (liver, nervous system, and blood system).   Unclear thinking.   Slowed reflexes.   Decreased muscle coordination.  HOME CARE  Do not drink and drive.   Do not drink alcohol if you are taking medicine or using other drugs. Doing so can cause serious medical problems or even death.   Drink enough water and fluids to keep your pee (urine) clear or pale yellow.   Eat healthy foods.   Only take medicine as told by your doctor.   Join an  alcohol support group.  GET HELP RIGHT AWAY IF:  You become shaky when you stop drinking.   Your thinking is unclear or you become confused.   You throw up (vomit) blood. It may look bright red or like coffee grounds.   You notice blood in your poop (bowel movements).   You become lightheaded or pass out (faint).  MAKE SURE YOU:   Understand these instructions.   Will watch your condition.   Will get help right away if you are not doing well or get worse.  Document Released: 05/08/2008 Document Revised: 11/09/2011 Document Reviewed: 05/09/2010 [facility] Patient Information 2012 [place], [place].  Finding Treatment for Alcohol and Drug Addiction It can be hard to find the right place to get professional treatment. Here are some important things to consider:  There are  different types of treatment to choose from.   Some programs are live-in (residential) while others are not (outpatient). Sometimes a combination is offered.   No single type of program is right for everyone.   Most treatment programs involve a combination of education, counseling, and a 12-step, spiritually-based approach.   There are non-spiritually based programs (not 12-step).   Some treatment programs are government sponsored. They are geared for patients without private insurance.   Treatment programs can vary in many respects such as:   Cost and types of insurance accepted.   Types of on-site medical services offered.   Length of stay, setting, and size.   Overall philosophy of treatment.  A person may need specialized treatment or have needs not addressed by all programs. For example, adolescents need treatment appropriate for their age. Other people have secondary disorders that must be managed as well. Secondary conditions can include mental illness, such as depression or diabetes. Often, a period of detoxification from alcohol or drugs is needed. This requires medical supervision and not all  programs offer this. THINGS TO CONSIDER WHEN SELECTING A TREATMENT PROGRAM   Is the program certified by the appropriate government agency? Even private programs must be certified and employ certified professionals.   Does the program accept your insurance? If not, can a payment plan be set up?   Is the facility clean, organized, and well run? Do they allow you to speak with graduates who can share their treatment experience with you? Can you tour the facility? Can you meet with staff?   Does the program meet the full range of individual needs?   Does the treatment program address sexual orientation and physical disabilities? Do they provide age, gender, and culturally appropriate treatment services?   Is treatment available in languages other than English?   Is long-term aftercare support or guidance encouraged and provided?   Is assessment of an individual's treatment plan ongoing to ensure it meets changing needs?   Does the program use strategies to encourage reluctant patients to remain in treatment long enough to increase the likelihood of success?   Does the program offer counseling (individual or group) and other behavioral therapies?   Does the program offer medicine as part of the treatment regimen, if needed?   Is there ongoing monitoring of possible relapse? Is there a defined relapse prevention program? Are services or referrals offered to family members to ensure they understand addiction and the recovery process? This would help them support the recovering individual.   Are 12-step meetings held at the center or is transport available for patients to attend outside meetings?  In countries outside of the [place]. and [place], Magazine features editor for contact information for services in your area. Document Released: 10/19/2005 Document Revised: 11/09/2011 Document Reviewed: 04/30/2008 [facility] Patient Information 2012 [place], [place].

## 2012-03-16 NOTE — ED Notes (Signed)
Social worker at bedside.

## 2012-06-25 ENCOUNTER — Emergency Department (HOSPITAL_COMMUNITY)
Admission: EM | Admit: 2012-06-25 | Discharge: 2012-06-26 | Disposition: A | Discharge status: Home / Self Care | Payer: Medicare Other | Attending: Emergency Medicine | Admitting: Emergency Medicine

## 2012-06-25 ENCOUNTER — Emergency Department (HOSPITAL_COMMUNITY): Payer: Medicare Other

## 2012-06-25 ENCOUNTER — Encounter (HOSPITAL_COMMUNITY): Payer: Self-pay | Admitting: Emergency Medicine

## 2012-06-25 DIAGNOSIS — E785 Hyperlipidemia, unspecified: Secondary | ICD-10-CM | POA: Insufficient documentation

## 2012-06-25 DIAGNOSIS — J449 Chronic obstructive pulmonary disease, unspecified: Secondary | ICD-10-CM | POA: Insufficient documentation

## 2012-06-25 DIAGNOSIS — I10 Essential (primary) hypertension: Secondary | ICD-10-CM | POA: Insufficient documentation

## 2012-06-25 DIAGNOSIS — S0191XA Laceration without foreign body of unspecified part of head, initial encounter: Secondary | ICD-10-CM

## 2012-06-25 DIAGNOSIS — J4489 Other specified chronic obstructive pulmonary disease: Secondary | ICD-10-CM | POA: Insufficient documentation

## 2012-06-25 DIAGNOSIS — R42 Dizziness and giddiness: Secondary | ICD-10-CM | POA: Insufficient documentation

## 2012-06-25 DIAGNOSIS — I2510 Atherosclerotic heart disease of native coronary artery without angina pectoris: Secondary | ICD-10-CM | POA: Insufficient documentation

## 2012-06-25 DIAGNOSIS — W1809XA Striking against other object with subsequent fall, initial encounter: Secondary | ICD-10-CM | POA: Insufficient documentation

## 2012-06-25 DIAGNOSIS — Z87891 Personal history of nicotine dependence: Secondary | ICD-10-CM | POA: Insufficient documentation

## 2012-06-25 DIAGNOSIS — W19XXXA Unspecified fall, initial encounter: Secondary | ICD-10-CM

## 2012-06-25 DIAGNOSIS — S0100XA Unspecified open wound of scalp, initial encounter: Secondary | ICD-10-CM | POA: Insufficient documentation

## 2012-06-25 LAB — COMPREHENSIVE METABOLIC PANEL
Albumin: 2.9 g/dL — ABNORMAL LOW (ref 3.5–5.2)
Alkaline Phosphatase: 104 U/L (ref 39–117)
BUN: 21 mg/dL (ref 6–23)
CO2: 20 mEq/L (ref 19–32)
Chloride: 95 mEq/L — ABNORMAL LOW (ref 96–112)
Creatinine, Ser: 1.54 mg/dL — ABNORMAL HIGH (ref 0.50–1.35)
GFR calc non Af Amer: 44 mL/min — ABNORMAL LOW (ref >90)
Glucose, Bld: 93 mg/dL (ref 70–99)
Potassium: 3.8 mEq/L (ref 3.5–5.1)
Total Bilirubin: 0.7 mg/dL (ref 0.3–1.2)

## 2012-06-25 LAB — CBC WITH DIFFERENTIAL/PLATELET
Basophils Relative: 0 % (ref 0–1)
HCT: 39 % (ref 39.0–52.0)
Hemoglobin: 13.8 g/dL (ref 13.0–17.0)
Lymphocytes Relative: 25 % (ref 12–46)
Lymphs Abs: 2.7 10*3/uL (ref 0.7–4.0)
MCHC: 35.4 g/dL (ref 30.0–36.0)
Monocytes Absolute: 1 10*3/uL (ref 0.1–1.0)
Monocytes Relative: 9 % (ref 3–12)
Neutro Abs: 7 10*3/uL (ref 1.7–7.7)
Neutrophils Relative %: 65 % (ref 43–77)
RBC: 4.16 MIL/uL — ABNORMAL LOW (ref 4.22–5.81)
WBC: 10.8 10*3/uL — ABNORMAL HIGH (ref 4.0–10.5)

## 2012-06-25 LAB — TROPONIN I: Troponin I: <0.3 ng/mL (ref <0.30)

## 2012-06-25 MED ORDER — HYDROCODONE-ACETAMINOPHEN 5-325 MG PO TABS
1.0000 | ORAL_TABLET | Freq: Once | ORAL | Status: AC
  Administered 2012-06-25: 1 via ORAL
  Filled 2012-06-25: qty 1

## 2012-06-25 MED ORDER — HYDROCODONE-ACETAMINOPHEN 5-325 MG PO TABS: 1.0000 | ORAL_TABLET | ORAL | Status: AC | PRN

## 2012-06-25 NOTE — ED Notes (Signed)
Pt st's he blacked out at home and fell hitting his head on a entertainment center.  Pt has approx 1" lac to back of head. Bleeding controlled at this time.  Pt alert and oriented x's 3, skin warm and dry, color appropriate.

## 2012-06-25 NOTE — ED Provider Notes (Signed)
History     CSN: [id number]  Arrival date & time 06/25/12  1944   First MD Initiated Contact with Patient 06/25/12 2056      Chief Complaint  Patient presents with  . Fall  . Head Laceration     HPI The patient presents with a laceration on the right side of his head.  He notes that just prior to arrival the patient rose quickly, became lightheaded, lost consciousness and fell striking and entertainment center.  Upon awakening he denies any chest pain, dyspnea, confusion, disorientation.  He notes mild pain, nonradiating, sore in the affected area.  He denies any extremity weakness.  He states that prior to this use of multiple prior similar events, been evaluated multiple prior times.  He notes no new medication changes, he continues to smoke. Past Medical History  Diagnosis Date  . Hypertension   . Coronary artery disease   . Hyperlipemia   . No pertinent past medical history   . Angina   . Shortness of breath   . Mental disorder   . Arthritis   . COPD (chronic obstructive pulmonary disease)   . Hiatal hernia   . Depression   . Gout     Past Surgical History  Procedure Date  . Abdominal surgery     History reviewed. No pertinent family history.  History  Substance Use Topics  . Smoking status: Former Smoker -- 1.0 packs/day for 15 years    Types: Cigarettes    Quit date: 10/12/2010  . Smokeless tobacco: Not on file  . Alcohol Use: 7.2 oz/week    12 Cans of beer per week     pt heavy drinker      Review of Systems  Constitutional:       Per HPI, otherwise negative  HENT:       Per HPI, otherwise negative  Eyes: Negative.   Respiratory:       Per HPI, otherwise negative  Cardiovascular:       Per HPI, otherwise negative  Gastrointestinal: Negative for vomiting.  Genitourinary: Negative.   Musculoskeletal:       Per HPI, otherwise negative  Skin: Negative.   Neurological: Negative for syncope.    Allergies  Review of patient's allergies indicates  no known allergies.  Home Medications   Current Outpatient Rx  Name Route Sig Dispense Refill  . ALLOPURINOL 300 MG PO TABS Oral Take 1 tablet (300 mg total) by mouth 2 (two) times daily. For gout 60 tablet 0  . CELECOXIB 200 MG PO CAPS Oral Take 1 capsule (200 mg total) by mouth 2 (two) times daily. For arthritis 60 capsule 0  . CLOPIDOGREL BISULFATE 75 MG PO TABS Oral Take 1 tablet (75 mg total) by mouth daily. For prevention of stroke and heart attack 30 tablet 0  . COLCHICINE 0.6 MG PO TABS Oral Take 1 tablet (0.6 mg total) by mouth daily. For gout 30 tablet 0  . DEXLANSOPRAZOLE 60 MG PO CPDR Oral Take 1 capsule (60 mg total) by mouth daily. For control of stomach acid secretion and helps GERD. 30 capsule 0  . DULOXETINE HCL 30 MG PO CPEP Oral Take 1 capsule (30 mg total) by mouth daily. For depression and pain management. 30 capsule 0  . GABAPENTIN 300 MG PO CAPS Oral Take 1 capsule (300 mg total) by mouth 3 (three) times daily. For anxiety and management of pain.  May be helpful for sleep. 90 capsule 0  . HYDROCHLOROTHIAZIDE  12.5 MG PO CAPS Oral Take 1 capsule (12.5 mg total) by mouth daily. For control of high blood pressure 30 capsule 0  . ISOSORBIDE MONONITRATE ER 30 MG PO TB24 Oral Take 1 tablet (30 mg total) by mouth daily. To prevent chest pain. 30 tablet 0  . METOPROLOL TARTRATE 25 MG PO TABS Oral Take 1 tablet (25 mg total) by mouth 2 (two) times daily. For control of high blood pressure 60 tablet 0  . MONTELUKAST SODIUM 10 MG PO TABS Oral Take 1 tablet (10 mg total) by mouth at bedtime. For shortness of air. 30 tablet 0  . POTASSIUM CHLORIDE 10 MEQ PO TBCR Oral Take 1 tablet (10 mEq total) by mouth 2 (two) times daily. For potassium replacement because the hydrochlorothiazide make you lose potassium 60 tablet 0  . THIAMINE HCL 100 MG PO TABS Oral Take 1 tablet (100 mg total) by mouth daily. For nutritional supplementation. 30 tablet 0    FOR nutritional recovery  . TIOTROPIUM  BROMIDE MONOHYDRATE 18 MCG IN CAPS Inhalation Place 1 capsule (18 mcg total) into inhaler and inhale daily. For shortness of air. 30 capsule 0  . TRAZODONE HCL 50 MG PO TABS Oral Take 1 tablet (50 mg total) by mouth at bedtime. For insomnia. 30 tablet 0    BP 85/65  Pulse 90  Temp 97 F (36.1 C) (Oral)  Resp 16  SpO2 100%  Physical Exam  Nursing note and vitals reviewed. Constitutional: He is oriented to person, place, and time. He appears well-developed. No distress.       Disheveled elderly appearing male in no distress.  HENT:  Head: Normocephalic.    Right Ear: Tympanic membrane and ear canal normal.  Eyes: Conjunctivae and EOM are normal.  Neck: Normal range of motion. Neck supple.  Cardiovascular: Normal rate and regular rhythm.   Pulmonary/Chest: Effort normal. No stridor. No respiratory distress.  Abdominal: He exhibits no distension.  Musculoskeletal: He exhibits no edema.  Neurological: He is alert and oriented to person, place, and time.  Skin: Skin is warm and dry.  Psychiatric: He has a normal mood and affect.    ED Course  LACERATION REPAIR Date/Time: 06/25/2012 11:00 PM Performed by: Gerhard Munch Authorized by: Gerhard Munch Consent: Verbal consent obtained. Written consent not obtained. The procedure was performed in an emergent situation. Risks and benefits: risks, benefits and alternatives were discussed Consent given by: patient Time out: Immediately prior to procedure a "time out" was called to verify the correct patient, procedure, equipment, support staff and site/side marked as required. Body area: head/neck Location details: scalp Laceration length: 5 cm Tendon involvement: none Nerve involvement: none Vascular damage: no Patient sedated: no Preparation: Patient was prepped and draped in the usual sterile fashion. Irrigation solution: saline Irrigation method: jet lavage Amount of cleaning: standard Debridement: none Degree of  undermining: none Skin closure: staples Number of sutures: 3 Technique: simple Approximation: close Approximation difficulty: simple Dressing: antibiotic ointment Patient tolerance: Patient tolerated the procedure well with no immediate complications.   (including critical care time)   Labs Reviewed  CBC WITH DIFFERENTIAL  COMPREHENSIVE METABOLIC PANEL  TROPONIN I   No results found.   No diagnosis found.  Cardiac 90 sinus rhythm normal Pulse oximetry 100% room air normal  Radiographic images interpreted by me   Date: 06/25/2012  Rate: 81  Rhythm: normal sinus rhythm  QRS Axis: normal  Intervals: normal  ST/T Wave abnormalities: nonspecific ST/T changes  Conduction Disutrbances:nonspecific intraventricular conduction  delay  Narrative Interpretation:   Old EKG Reviewed: unchanged ABNORMAL - no sig changes  11:19 PM The patient denies current complaints.  MAP 73 Wound repaired. MDM  This elderly appearing male presents following a fall.  The patient's description of a fall that occurred after quickly arising, history of multiple similar events is suggestive of an orthostatic event.  The patient's denial of any chest pain, focal complaints upon awakening him again mild dizziness is reassuring.  On exam the patient is in no distress.  He does have a nonbleeding hematoma on the right side of his head.  The patient's ECG was unchanged, nonischemic.  His labs were reassuring, though he has a mild leukocytosis.  The patient was counseled on the need to abstain from alcohol intake, the need for close outpatient followup for continued evaluation of his falls. Absent neuro complaints / findings or any CP, there is low suspicion for ongoing ischemia.  The patient's chronic condition is further reassurance.    Gerhard Munch, MD 06/25/12 2329

## 2012-06-25 NOTE — ED Notes (Signed)
Per EMS, patient loss balance while getting up from a sitting position, fell, and hit head on entertainment center.  Patient has laceration to right side of head.  Patient reports dizziness.  Alert and oriented x4; PERRL present.  Bleeding controlled at laceration site.  IV started in right AC; 20 GA.

## 2012-06-26 NOTE — ED Notes (Addendum)
Rx given x1 In no acute distress, A&Ox4. D/c instructions reviewed w/ pt - pt denies any further questions or concerns at present.

## 2012-06-26 NOTE — ED Notes (Signed)
Attempted to call multiple times for pt's ride w/o success - pt unable to find a ride and unable to ambulated long distances w/o assistance d/t weakness and some dizziness.

## 2012-07-08 ENCOUNTER — Emergency Department (HOSPITAL_COMMUNITY)
Admission: EM | Admit: 2012-07-08 | Discharge: 2012-07-08 | Disposition: A | Discharge status: Home / Self Care | Payer: Medicare Other | Attending: Emergency Medicine | Admitting: Emergency Medicine

## 2012-07-08 ENCOUNTER — Encounter (HOSPITAL_COMMUNITY): Payer: Self-pay

## 2012-07-08 DIAGNOSIS — M129 Arthropathy, unspecified: Secondary | ICD-10-CM | POA: Insufficient documentation

## 2012-07-08 DIAGNOSIS — H6591 Unspecified nonsuppurative otitis media, right ear: Secondary | ICD-10-CM

## 2012-07-08 DIAGNOSIS — H6590 Unspecified nonsuppurative otitis media, unspecified ear: Secondary | ICD-10-CM | POA: Insufficient documentation

## 2012-07-08 DIAGNOSIS — Z87891 Personal history of nicotine dependence: Secondary | ICD-10-CM | POA: Insufficient documentation

## 2012-07-08 DIAGNOSIS — M109 Gout, unspecified: Secondary | ICD-10-CM | POA: Insufficient documentation

## 2012-07-08 DIAGNOSIS — F3289 Other specified depressive episodes: Secondary | ICD-10-CM | POA: Insufficient documentation

## 2012-07-08 DIAGNOSIS — I2510 Atherosclerotic heart disease of native coronary artery without angina pectoris: Secondary | ICD-10-CM | POA: Insufficient documentation

## 2012-07-08 DIAGNOSIS — E785 Hyperlipidemia, unspecified: Secondary | ICD-10-CM | POA: Insufficient documentation

## 2012-07-08 DIAGNOSIS — I10 Essential (primary) hypertension: Secondary | ICD-10-CM | POA: Insufficient documentation

## 2012-07-08 DIAGNOSIS — F329 Major depressive disorder, single episode, unspecified: Secondary | ICD-10-CM | POA: Insufficient documentation

## 2012-07-08 DIAGNOSIS — Z4802 Encounter for removal of sutures: Secondary | ICD-10-CM

## 2012-07-08 MED ORDER — AMOXICILLIN 500 MG PO CAPS: 1000.0000 mg | ORAL_CAPSULE | Freq: Three times a day (TID) | ORAL | Status: AC

## 2012-07-08 MED ORDER — ANTIPYRINE-BENZOCAINE 5.4-1.4 % OT SOLN: 3.0000 [drp] | OTIC | Status: AC | PRN

## 2012-07-08 NOTE — ED Notes (Signed)
Per Patient requested for ride home called [name] at [phone] No answer Doctor notified.

## 2012-07-08 NOTE — ED Notes (Signed)
Contacted Number spoke with Whitney Post and stated will be able to pick up patient at 1300-1400.

## 2012-07-08 NOTE — ED Notes (Signed)
Unable to locate ride home or ride that brought him to hospital. Per md, pt has to have ride home will hold in room until they arrive.

## 2012-07-08 NOTE — ED Notes (Signed)
Patient given [place] sandwich, apple sauce, crackers, orange juice.  Calm cooperative talking and movie stars and Heart Doctor.

## 2012-07-08 NOTE — ED Provider Notes (Signed)
History     CSN: [id number]  Arrival date & time 07/08/12  4098   First MD Initiated Contact with Patient 07/08/12 [phone]      Chief Complaint  Patient presents with  . Suture / Staple Removal    (Consider location/radiation/quality/duration/timing/severity/associated sxs/prior treatment) HPI Comments: Zachary [name] 71 y.o. male   The chief complaint is: Patient presents with:   Suture / Staple Removal   The patient has medical history significant for:   Past Medical History:   Hypertension                                                 Coronary artery disease                                      Hyperlipemia                                                 No pertinent past medical history                            Angina                                                       Shortness of breath                                          Mental disorder                                              Arthritis                                                    COPD (chronic obstructive pulmonary disease)                 Hiatal hernia                                                Depression                                                   Gout  The onset of the symptoms was  gradual starting 8 days ago,  The Course is  persistent, gradually worsened nothing makes symptoms worse, nothing makes symptoms better Has associated tinnitus and decreased hearing Denies  fever, cough, congestion, headache, nausea, vomiting, diarrhea.      The history is provided by the patient and medical records. History Limited By: none.   Zachary [name] is a 71 y.o. male presents to the emergency department for suture removal and complaining of right ear pain and decreased hearing.  He states the pain in his ear has been going on for 8 days.  He also states he extracted a large amount of wax from that ear this week.  The pain radiates down  into his head and slightly into his throat.  He denies fever, cough, congestion, headache, nausea, vomiting, diarrhea.  He has had associated tinnitus in the R ear.  He has not tried anything for the pain/hearing loss.    Past Medical History  Diagnosis Date  . Hypertension   . Coronary artery disease   . Hyperlipemia   . No pertinent past medical history   . Angina   . Shortness of breath   . Mental disorder   . Arthritis   . COPD (chronic obstructive pulmonary disease)   . Hiatal hernia   . Depression   . Gout     Past Surgical History  Procedure Date  . Abdominal surgery     No family history on file.  History  Substance Use Topics  . Smoking status: Former Smoker -- 1.0 packs/day for 15 years    Types: Cigarettes    Quit date: 10/12/2010  . Smokeless tobacco: Not on file  . Alcohol Use: 7.2 oz/week    12 Cans of beer per week     pt heavy drinker      Review of Systems  Constitutional: Negative for fever, chills, diaphoresis and fatigue.  HENT: Positive for hearing loss, ear pain and sore throat. Negative for nosebleeds, congestion, facial swelling, rhinorrhea, mouth sores, trouble swallowing, neck pain, neck stiffness, dental problem, postnasal drip, sinus pressure and ear discharge.   Eyes: Negative for pain.  Respiratory: Negative for shortness of breath.   Cardiovascular: Negative for chest pain.  Gastrointestinal: Negative for nausea, vomiting and diarrhea.  Skin: Negative for rash.  Neurological: Negative for headaches.    Allergies  Review of patient's allergies indicates no known allergies.  Home Medications   Current Outpatient Rx  Name Route Sig Dispense Refill  . ALLOPURINOL 300 MG PO TABS Oral Take 1 tablet (300 mg total) by mouth 2 (two) times daily. For gout 60 tablet 0  . CELECOXIB 200 MG PO CAPS Oral Take 1 capsule (200 mg total) by mouth 2 (two) times daily. For arthritis 60 capsule 0  . CLOPIDOGREL BISULFATE 75 MG PO TABS Oral Take 1  tablet (75 mg total) by mouth daily. For prevention of stroke and heart attack 30 tablet 0  . COLCHICINE 0.6 MG PO TABS Oral Take 1 tablet (0.6 mg total) by mouth daily. For gout 30 tablet 0  . DEXLANSOPRAZOLE 60 MG PO CPDR Oral Take 1 capsule (60 mg total) by mouth daily. For control of stomach acid secretion and helps GERD. 30 capsule 0  . DULOXETINE HCL 30 MG PO CPEP Oral Take 1 capsule (30 mg total) by mouth daily. For depression and pain management. 30 capsule 0  . GABAPENTIN 300 MG PO CAPS Oral Take 1 capsule (300 mg  total) by mouth 3 (three) times daily. For anxiety and management of pain.  May be helpful for sleep. 90 capsule 0  . HYDROCHLOROTHIAZIDE 12.5 MG PO CAPS Oral Take 1 capsule (12.5 mg total) by mouth daily. For control of high blood pressure 30 capsule 0  . ISOSORBIDE MONONITRATE ER 30 MG PO TB24 Oral Take 1 tablet (30 mg total) by mouth daily. To prevent chest pain. 30 tablet 0  . METOPROLOL TARTRATE 25 MG PO TABS Oral Take 1 tablet (25 mg total) by mouth 2 (two) times daily. For control of high blood pressure 60 tablet 0  . MONTELUKAST SODIUM 10 MG PO TABS Oral Take 1 tablet (10 mg total) by mouth at bedtime. For shortness of air. 30 tablet 0  . POTASSIUM CHLORIDE 10 MEQ PO TBCR Oral Take 1 tablet (10 mEq total) by mouth 2 (two) times daily. For potassium replacement because the hydrochlorothiazide make you lose potassium 60 tablet 0  . THIAMINE HCL 100 MG PO TABS Oral Take 1 tablet (100 mg total) by mouth daily. For nutritional supplementation. 30 tablet 0    FOR nutritional recovery  . TIOTROPIUM BROMIDE MONOHYDRATE 18 MCG IN CAPS Inhalation Place 1 capsule (18 mcg total) into inhaler and inhale daily. For shortness of air. 30 capsule 0  . TRAZODONE HCL 50 MG PO TABS Oral Take 1 tablet (50 mg total) by mouth at bedtime. For insomnia. 30 tablet 0  . AMOXICILLIN 500 MG PO CAPS Oral Take 2 capsules (1,000 mg total) by mouth 3 (three) times daily. 60 capsule 0  .  ANTIPYRINE-BENZOCAINE 5.4-1.4 % OT SOLN Right Ear Place 3 drops into the right ear every 2 (two) hours as needed for pain. 10 mL 0    BP 125/71  Pulse 108  Temp 98.5 F (36.9 C) (Oral)  Resp 17  SpO2 94%  Physical Exam  Nursing note and vitals reviewed. Constitutional: He appears well-developed and well-nourished. No distress.  HENT:  Head: Normocephalic and atraumatic.  Right Ear: External ear normal. No drainage. No foreign bodies. Tympanic membrane is injected and bulging. A middle ear effusion is present. Decreased hearing is noted.  Left Ear: External ear normal. No drainage. No foreign bodies. Tympanic membrane is not injected and not bulging.  No middle ear effusion. No decreased hearing is noted.  Nose: Nose normal. Right sinus exhibits no maxillary sinus tenderness and no frontal sinus tenderness. Left sinus exhibits no maxillary sinus tenderness and no frontal sinus tenderness.  Mouth/Throat: Uvula is midline, oropharynx is clear and moist and mucous membranes are normal. No uvula swelling. No oropharyngeal exudate.  Eyes: Conjunctivae are normal. No scleral icterus.  Neck: Normal range of motion. Neck supple.  Cardiovascular: Normal rate, regular rhythm, normal heart sounds and intact distal pulses.   Pulmonary/Chest: Effort normal and breath sounds normal. No respiratory distress. He has no wheezes.  Abdominal: Soft. Bowel sounds are normal. He exhibits no mass. There is no tenderness. There is no rebound and no guarding.  Musculoskeletal: Normal range of motion. He exhibits no edema.  Lymphadenopathy:    He has no cervical adenopathy.  Neurological: He is alert.       Speech is clear and goal oriented Moves extremities without ataxia  Skin: Skin is warm and dry. No rash noted. He is not diaphoretic.  Psychiatric: He has a normal mood and affect.    ED Course  Procedures (including critical care time)  Labs Reviewed - No data to display No results found.  SUTURE  REMOVAL Performed by: Dierdre Forth  Consent: Verbal consent obtained. Patient identity confirmed: provided demographic data Time out: Immediately prior to procedure a "time out" was called to verify the correct patient, procedure, equipment, support staff and site/side marked as required.  Location details: R scalp  Wound Appearance: clean  Sutures/Staples Removed: 3  Facility: sutures placed in this facility Patient tolerance: Patient tolerated the procedure well with no immediate complications.  1. Visit for suture removal   2. Right otitis media with effusion     MDM  Zachary [name] presents for suture removal in pain or near.  Staples removed without difficulty, wound was clean and dry, healing well. Instructions given on continued wound care. Evidence of right middle ear effusion and infection consistent with otitis media.  Patient is nontoxic-appearing there are no signs of systemic infection. Will treat with antibiotics on outpatient basis.    1. Medications: amoxicillin 2. Treatment: rest, hydration, good wound care 3. Follow Up: as needed in the emergency room or with your family doctor.           Dahlia Client Aliyah Abeyta, PA-C 07/08/12 [phone]

## 2012-07-08 NOTE — ED Provider Notes (Signed)
Medical screening examination/treatment/procedure(s) were performed by non-physician practitioner and as supervising physician I was immediately available for consultation/collaboration.   Charles B. Bernette Mayers, MD 07/08/12 [phone]

## 2012-07-08 NOTE — ED Notes (Signed)
Pt here for suture removal from scalp and also sts hearing is going from right ear and ear drum hurts, pt with heavy etoh on board, admits to 1/2 of 40 oz beer this am.

## 2012-11-08 IMAGING — CR DG TIBIA/FIBULA 2V*L*
4 series · 4 of 4 positions shown · non-contrast
Comparison: Left ankle radiographs 12/15/2010

CLINICAL DATA: Left ankle fracture, pain

LEFT TIBIA AND FIBULA - 2 VIEW

[t tib/fib ap left (1 of 2)]
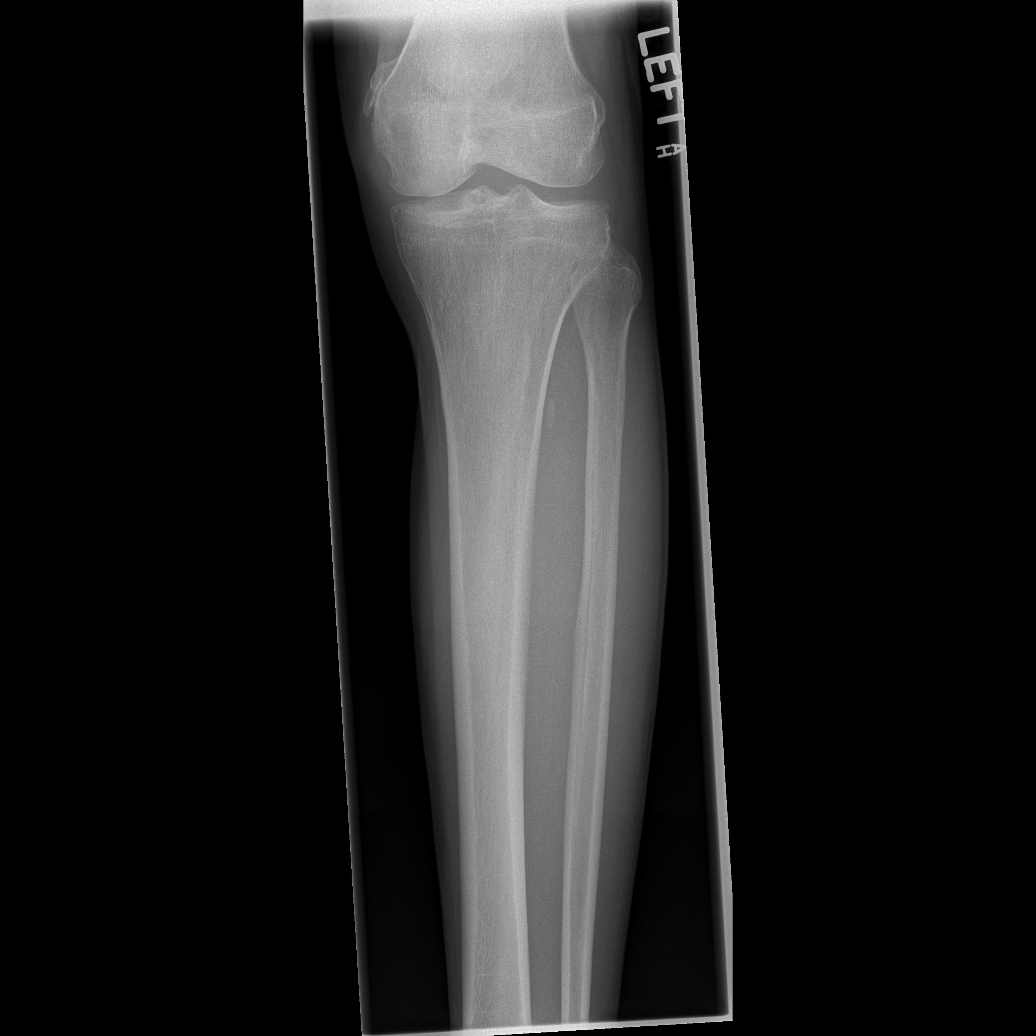

[t tib/fib ap left (2 of 2)]
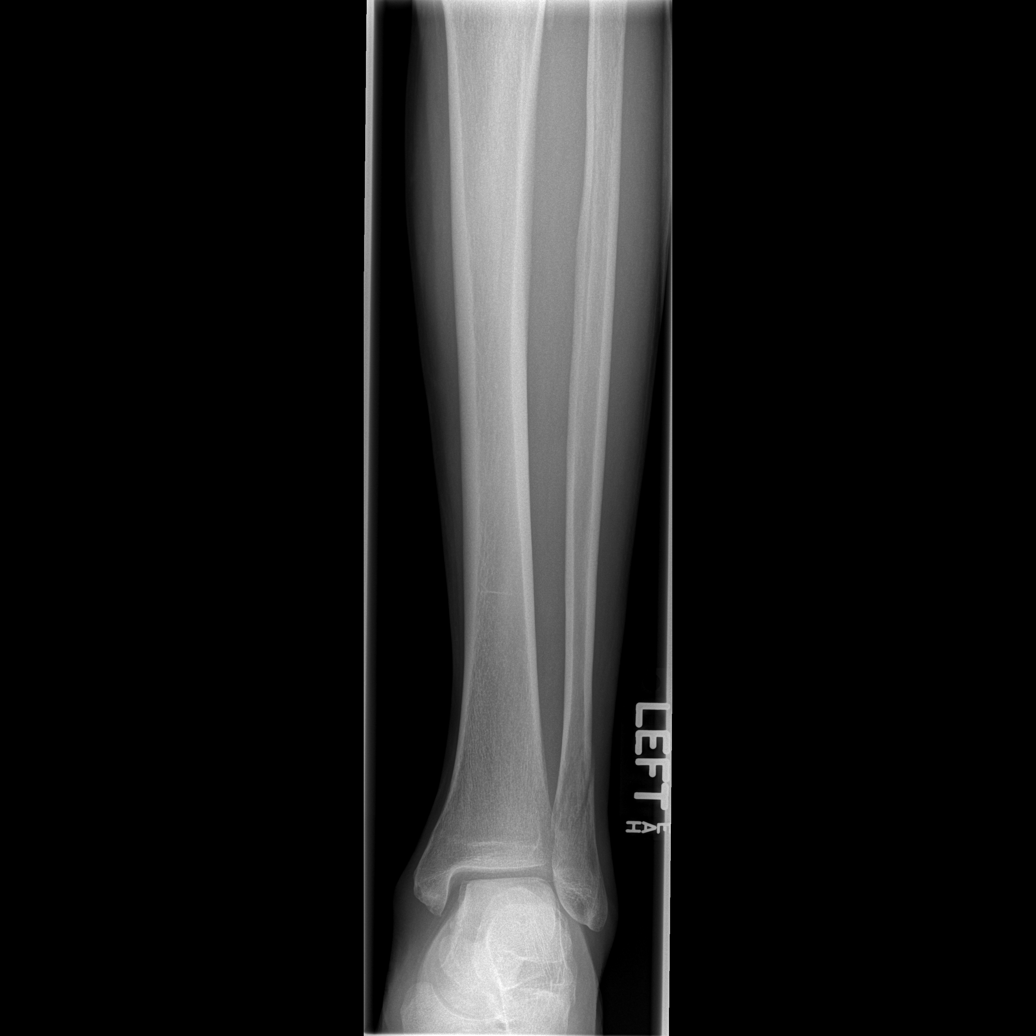

[t tib/fib lat left (1 of 2)]
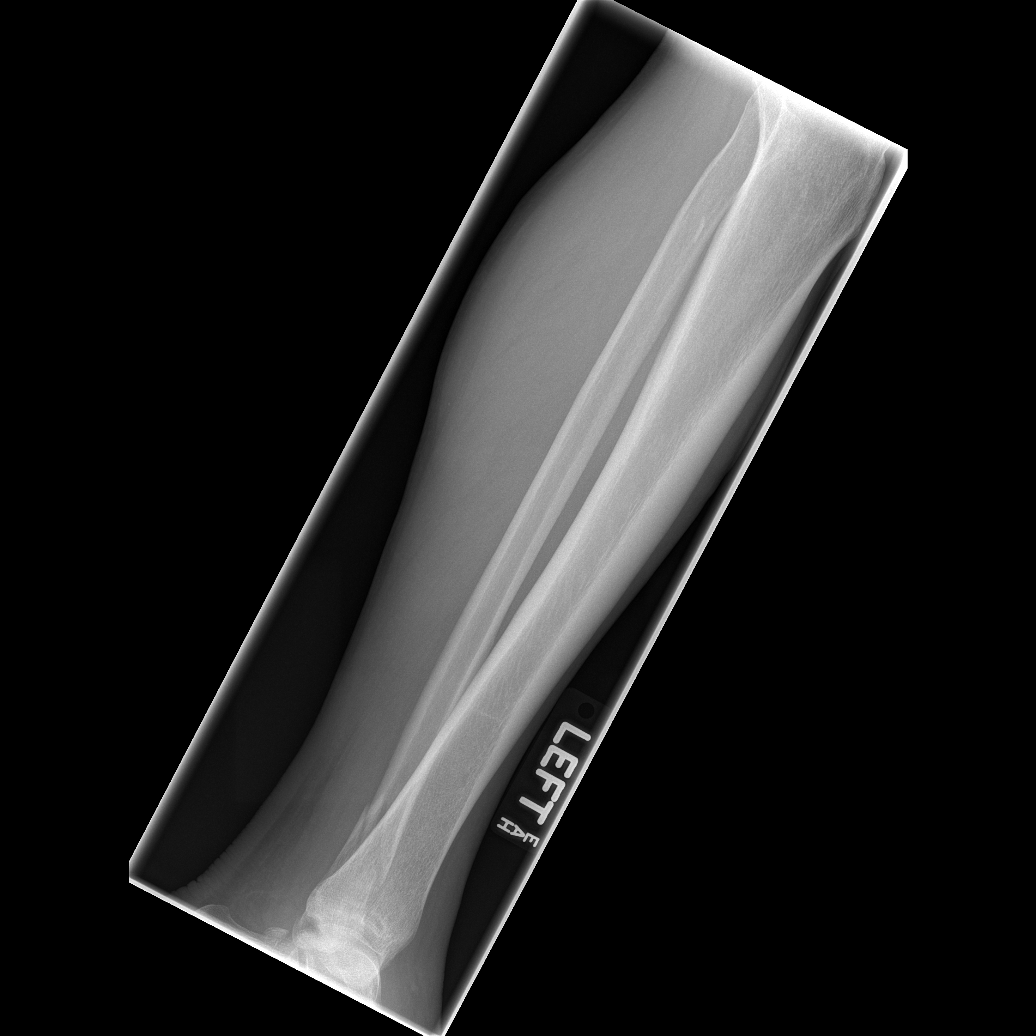

[t tib/fib lat left (2 of 2)]
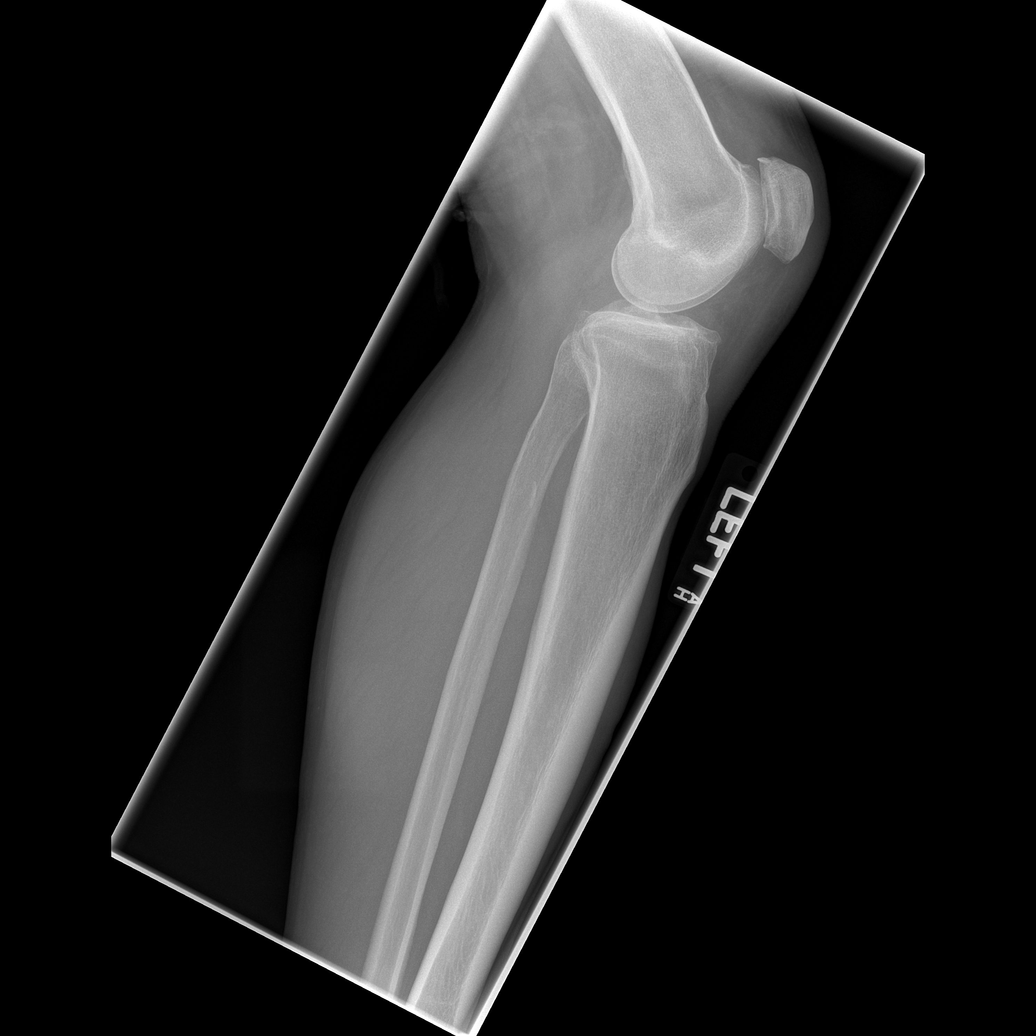

[4 of 4 positions shown; findings below may reference images not displayed]

FINDINGS: Oblique fracture of distal left fibular metadiaphysis, unchanged
since prior exam.
Ankle and knee joint alignments normal.
Bones appear demineralized.
No additional fracture, dislocation or bone destruction.
IMPRESSION: Minimally displaced oblique distal left fibular metadiaphyseal
fracture, unchanged.

## 2012-11-19 ENCOUNTER — Emergency Department (HOSPITAL_COMMUNITY): Payer: Medicare Other

## 2012-11-19 ENCOUNTER — Other Ambulatory Visit: Payer: Self-pay

## 2012-11-19 ENCOUNTER — Observation Stay (HOSPITAL_COMMUNITY)
Admission: EM | Admit: 2012-11-19 | Discharge: 2012-11-20 | Disposition: A | Discharge status: Home / Self Care | Payer: Medicare Other | Attending: Cardiovascular Disease | Admitting: Cardiovascular Disease

## 2012-11-19 ENCOUNTER — Encounter (HOSPITAL_COMMUNITY): Payer: Self-pay | Admitting: Emergency Medicine

## 2012-11-19 DIAGNOSIS — J449 Chronic obstructive pulmonary disease, unspecified: Secondary | ICD-10-CM | POA: Insufficient documentation

## 2012-11-19 DIAGNOSIS — J4489 Other specified chronic obstructive pulmonary disease: Secondary | ICD-10-CM | POA: Insufficient documentation

## 2012-11-19 DIAGNOSIS — I498 Other specified cardiac arrhythmias: Secondary | ICD-10-CM | POA: Insufficient documentation

## 2012-11-19 DIAGNOSIS — F1010 Alcohol abuse, uncomplicated: Secondary | ICD-10-CM | POA: Insufficient documentation

## 2012-11-19 DIAGNOSIS — I471 Supraventricular tachycardia: Secondary | ICD-10-CM

## 2012-11-19 DIAGNOSIS — I2510 Atherosclerotic heart disease of native coronary artery without angina pectoris: Secondary | ICD-10-CM | POA: Insufficient documentation

## 2012-11-19 DIAGNOSIS — R079 Chest pain, unspecified: Principal | ICD-10-CM | POA: Insufficient documentation

## 2012-11-19 DIAGNOSIS — R0602 Shortness of breath: Secondary | ICD-10-CM | POA: Insufficient documentation

## 2012-11-19 DIAGNOSIS — Z79899 Other long term (current) drug therapy: Secondary | ICD-10-CM | POA: Insufficient documentation

## 2012-11-19 DIAGNOSIS — I10 Essential (primary) hypertension: Secondary | ICD-10-CM | POA: Insufficient documentation

## 2012-11-19 DIAGNOSIS — Z23 Encounter for immunization: Secondary | ICD-10-CM | POA: Insufficient documentation

## 2012-11-19 DIAGNOSIS — M109 Gout, unspecified: Secondary | ICD-10-CM | POA: Insufficient documentation

## 2012-11-19 LAB — HEPARIN LEVEL (UNFRACTIONATED): Heparin Unfractionated: 0.48 IU/mL (ref 0.30–0.70)

## 2012-11-19 LAB — RAPID URINE DRUG SCREEN, HOSP PERFORMED
Cocaine: NOT DETECTED
Opiates: NOT DETECTED
Tetrahydrocannabinol: NOT DETECTED

## 2012-11-19 LAB — URINALYSIS, ROUTINE W REFLEX MICROSCOPIC
Glucose, UA: NEGATIVE mg/dL
Hgb urine dipstick: NEGATIVE
Specific Gravity, Urine: 1.008 (ref 1.005–1.030)

## 2012-11-19 LAB — TROPONIN I: Troponin I: <0.3 ng/mL (ref <0.30)

## 2012-11-19 LAB — POCT I-STAT, CHEM 8
BUN: 8 mg/dL (ref 6–23)
Chloride: 105 mEq/L (ref 96–112)
Creatinine, Ser: 1.4 mg/dL — ABNORMAL HIGH (ref 0.50–1.35)
Potassium: 3.7 mEq/L (ref 3.5–5.1)
Sodium: 146 mEq/L — ABNORMAL HIGH (ref 135–145)

## 2012-11-19 LAB — CBC
HCT: 42.2 % (ref 39.0–52.0)
MCHC: 33.9 g/dL (ref 30.0–36.0)
Platelets: 253 10*3/uL (ref 150–400)
RDW: 14.7 % (ref 11.5–15.5)
WBC: 10.2 10*3/uL (ref 4.0–10.5)

## 2012-11-19 LAB — COMPREHENSIVE METABOLIC PANEL
AST: 72 U/L — ABNORMAL HIGH (ref 0–37)
Albumin: 3.2 g/dL — ABNORMAL LOW (ref 3.5–5.2)
BUN: 9 mg/dL (ref 6–23)
Creatinine, Ser: 1.14 mg/dL (ref 0.50–1.35)
Potassium: 3.7 mEq/L (ref 3.5–5.1)
Total Protein: 7.1 g/dL (ref 6.0–8.3)

## 2012-11-19 LAB — POCT I-STAT TROPONIN I

## 2012-11-19 LAB — CG4 I-STAT (LACTIC ACID): Lactic Acid, Venous: 2.88 mmol/L — ABNORMAL HIGH (ref 0.5–2.2)

## 2012-11-19 LAB — PROTIME-INR
INR: 1 (ref 0.00–1.49)
Prothrombin Time: 13.1 seconds (ref 11.6–15.2)

## 2012-11-19 MED ORDER — CLOPIDOGREL BISULFATE 75 MG PO TABS
75.0000 mg | ORAL_TABLET | Freq: Every day | ORAL | Status: DC
  Administered 2012-11-19 – 2012-11-20 (×2): 75 mg via ORAL
  Filled 2012-11-19 (×2): qty 1

## 2012-11-19 MED ORDER — SODIUM CHLORIDE 0.9 % IJ SOLN: 3.0000 mL | Freq: Two times a day (BID) | INTRAMUSCULAR | Status: DC

## 2012-11-19 MED ORDER — POTASSIUM CHLORIDE CRYS ER 20 MEQ PO TBCR
10.0000 meq | EXTENDED_RELEASE_TABLET | Freq: Every day | ORAL | Status: DC
  Administered 2012-11-19 – 2012-11-20 (×2): 10 meq via ORAL
  Filled 2012-11-19: qty 1
  Filled 2012-11-19 (×3): qty 0.5

## 2012-11-19 MED ORDER — IOHEXOL 350 MG/ML SOLN
100.0000 mL | Freq: Once | INTRAVENOUS | Status: AC | PRN
  Administered 2012-11-19: 75 mL via INTRAVENOUS

## 2012-11-19 MED ORDER — ISOSORBIDE MONONITRATE ER 30 MG PO TB24
30.0000 mg | ORAL_TABLET | Freq: Every day | ORAL | Status: DC
  Administered 2012-11-19 – 2012-11-20 (×2): 30 mg via ORAL
  Filled 2012-11-19 (×2): qty 1

## 2012-11-19 MED ORDER — ACETAMINOPHEN 325 MG PO TABS
650.0000 mg | ORAL_TABLET | ORAL | Status: DC | PRN
  Administered 2012-11-20: 650 mg via ORAL
  Filled 2012-11-19: qty 2

## 2012-11-19 MED ORDER — ALPRAZOLAM 0.25 MG PO TABS: 0.2500 mg | ORAL_TABLET | Freq: Two times a day (BID) | ORAL | Status: DC | PRN

## 2012-11-19 MED ORDER — SODIUM CHLORIDE 0.9 % IJ SOLN: 3.0000 mL | INTRAMUSCULAR | Status: DC | PRN

## 2012-11-19 MED ORDER — SODIUM CHLORIDE 0.9 % IV BOLUS (SEPSIS)
1000.0000 mL | Freq: Once | INTRAVENOUS | Status: AC
  Administered 2012-11-19: 1000 mL via INTRAVENOUS

## 2012-11-19 MED ORDER — ALLOPURINOL 300 MG PO TABS
300.0000 mg | ORAL_TABLET | Freq: Two times a day (BID) | ORAL | Status: DC
  Administered 2012-11-19 – 2012-11-20 (×3): 300 mg via ORAL
  Filled 2012-11-19 (×4): qty 1

## 2012-11-19 MED ORDER — ASPIRIN EC 81 MG PO TBEC
81.0000 mg | DELAYED_RELEASE_TABLET | Freq: Every day | ORAL | Status: DC
  Administered 2012-11-20: 81 mg via ORAL
  Filled 2012-11-19: qty 1

## 2012-11-19 MED ORDER — HYDROCHLOROTHIAZIDE 12.5 MG PO CAPS
12.5000 mg | ORAL_CAPSULE | Freq: Every day | ORAL | Status: DC
  Administered 2012-11-19 – 2012-11-20 (×2): 12.5 mg via ORAL
  Filled 2012-11-19 (×2): qty 1

## 2012-11-19 MED ORDER — ONDANSETRON HCL 4 MG/2ML IJ SOLN: 4.0000 mg | Freq: Four times a day (QID) | INTRAMUSCULAR | Status: DC | PRN

## 2012-11-19 MED ORDER — ASPIRIN 81 MG PO CHEW
324.0000 mg | CHEWABLE_TABLET | Freq: Once | ORAL | Status: DC
  Filled 2012-11-19: qty 4

## 2012-11-19 MED ORDER — COLCHICINE 0.6 MG PO TABS
0.6000 mg | ORAL_TABLET | Freq: Every day | ORAL | Status: DC
  Administered 2012-11-19 – 2012-11-20 (×2): 0.6 mg via ORAL
  Filled 2012-11-19 (×2): qty 1

## 2012-11-19 MED ORDER — ASPIRIN 300 MG RE SUPP: 300.0000 mg | RECTAL | Status: AC

## 2012-11-19 MED ORDER — METOPROLOL TARTRATE 1 MG/ML IV SOLN
5.0000 mg | Freq: Once | INTRAVENOUS | Status: AC
  Administered 2012-11-19: 5 mg via INTRAVENOUS
  Filled 2012-11-19: qty 5

## 2012-11-19 MED ORDER — HEPARIN (PORCINE) IN NACL 100-0.45 UNIT/ML-% IJ SOLN
1100.0000 [IU]/h | INTRAMUSCULAR | Status: DC
  Administered 2012-11-19: 800 [IU]/h via INTRAVENOUS
  Filled 2012-11-19: qty 250

## 2012-11-19 MED ORDER — SODIUM CHLORIDE 0.9 % IV SOLN: 250.0000 mL | INTRAVENOUS | Status: DC | PRN

## 2012-11-19 MED ORDER — SIMVASTATIN 20 MG PO TABS
20.0000 mg | ORAL_TABLET | Freq: Every day | ORAL | Status: DC
  Administered 2012-11-19: 20 mg via ORAL
  Filled 2012-11-19 (×2): qty 1

## 2012-11-19 MED ORDER — HEPARIN BOLUS VIA INFUSION
4000.0000 [IU] | Freq: Once | INTRAVENOUS | Status: AC
  Administered 2012-11-19: 4000 [IU] via INTRAVENOUS

## 2012-11-19 MED ORDER — ZOLPIDEM TARTRATE 5 MG PO TABS: 5.0000 mg | ORAL_TABLET | Freq: Every evening | ORAL | Status: DC | PRN

## 2012-11-19 MED ORDER — GABAPENTIN 300 MG PO CAPS
300.0000 mg | ORAL_CAPSULE | Freq: Three times a day (TID) | ORAL | Status: DC
  Administered 2012-11-19 – 2012-11-20 (×4): 300 mg via ORAL
  Filled 2012-11-19 (×6): qty 1

## 2012-11-19 MED ORDER — NITROGLYCERIN 0.4 MG SL SUBL: 0.4000 mg | SUBLINGUAL_TABLET | SUBLINGUAL | Status: DC | PRN

## 2012-11-19 MED ORDER — METOPROLOL TARTRATE 25 MG PO TABS
25.0000 mg | ORAL_TABLET | Freq: Two times a day (BID) | ORAL | Status: DC
  Administered 2012-11-19 – 2012-11-20 (×3): 25 mg via ORAL
  Filled 2012-11-19 (×4): qty 1

## 2012-11-19 MED ORDER — THIAMINE HCL 100 MG PO TABS
100.0000 mg | ORAL_TABLET | Freq: Every day | ORAL | Status: DC
  Administered 2012-11-19 – 2012-11-20 (×2): 100 mg via ORAL
  Filled 2012-11-19 (×2): qty 1

## 2012-11-19 MED ORDER — INFLUENZA VIRUS VACC SPLIT PF IM SUSP
0.5000 mL | INTRAMUSCULAR | Status: AC
  Administered 2012-11-20: 0.5 mL via INTRAMUSCULAR
  Filled 2012-11-19: qty 0.5

## 2012-11-19 MED ORDER — ASPIRIN 81 MG PO CHEW
324.0000 mg | CHEWABLE_TABLET | ORAL | Status: AC
  Administered 2012-11-19: 324 mg via ORAL

## 2012-11-19 NOTE — ED Notes (Signed)
[facility] presents to ED with 71 y.o. male with chest pain since late last night (11 pm).  Pain radiates down left arm to chest.  HR 140s to 150s and would occasionally drop down to 70s.  324 mg ASA given and 1 sublingual nitroglycerin given in route by [facility]. 20 gauge IV placed in left hand with fluids running KVO upon arrival.  Pt.has had ETOH as reported by [facility].

## 2012-11-19 NOTE — Progress Notes (Signed)
ANTICOAGULATION CONSULT NOTE - Initial Consult  Pharmacy Consult for Heparin Indication: chest pain/ACS  No Known Allergies  Patient Measurements: Height: 5' 6.5" (168.9 cm) Weight: 146 lb (66.225 kg) IBW/kg (Calculated) : 64.95    Vital Signs: Temp: 98.7 F (37.1 C) (12/17 1409) Temp src: Oral (12/17 1409) BP: 137/78 mmHg (12/17 1409) Pulse Rate: 77  (12/17 1409)  Labs:  Basename 11/19/12 1458 11/19/12 0826 11/19/12 0446 11/19/12 0413  HGB -- -- 15.6 14.3  HCT -- -- 46.0 42.2  PLT -- -- -- 253  APTT -- -- -- 31  LABPROT -- -- -- 13.1  INR -- -- -- 1.00  HEPARINUNFRC 0.48 -- -- --  CREATININE -- -- 1.40* 1.14  CKTOTAL -- -- -- --  CKMB -- -- -- --  TROPONINI -- <0.30 -- --    Estimated Creatinine Clearance: 45.8 ml/min (by C-G formula based on Cr of 1.4).   Medical History: Past Medical History  Diagnosis Date  . Hypertension   . Coronary artery disease   . Hyperlipemia   . Angina   . Shortness of breath   . Mental disorder   . Arthritis   . COPD (chronic obstructive pulmonary disease)   . Hiatal hernia   . Depression   . Gout     Medications:  No anticoagulation PTA   Assessment: Zachary [name] is a 70 year old male presenting to the hospital with tachycardia and chest pain to start heparin for r/o acs. Troponin negative x 1. Heparin level (0.48) is within therapeutic range. No bleeding noted.  Goal of Therapy:  Heparin level 0.3-0.7 units/ml Monitor platelets by anticoagulation protocol: Yes   Plan:  Continue heparin infusion at 800 units/hr  F/u Am Heparin level and CBC  Thank you,   Bayard Hugger, PharmD, BCPS  Clinical Pharmacist  Pager: [phone]   11/19/2012 3:35 PM

## 2012-11-19 NOTE — ED Notes (Signed)
Admitting MD at the bedside.  

## 2012-11-19 NOTE — Progress Notes (Signed)
ANTICOAGULATION CONSULT NOTE - Initial Consult  Pharmacy Consult for Heparin Indication: chest pain/ACS  No Known Allergies  Patient Measurements:  Patient's estimated weight of 65 kg   Vital Signs: Temp: 98.7 F (37.1 C) (12/17 0415) Temp src: Oral (12/17 0415) BP: 113/70 mmHg (12/17 0715) Pulse Rate: 92  (12/17 0715)  Labs:  Basename 11/19/12 0446 11/19/12 0413  HGB 15.6 14.3  HCT 46.0 42.2  PLT -- 253  APTT -- 31  LABPROT -- 13.1  INR -- 1.00  HEPARINUNFRC -- --  CREATININE 1.40* 1.14  CKTOTAL -- --  CKMB -- --  TROPONINI -- --    The CrCl is unknown because both a height and weight (above a minimum accepted value) are required for this calculation.   Medical History: Past Medical History  Diagnosis Date  . Hypertension   . Coronary artery disease   . Hyperlipemia   . No pertinent past medical history   . Angina   . Shortness of breath   . Mental disorder   . Arthritis   . COPD (chronic obstructive pulmonary disease)   . Hiatal hernia   . Depression   . Gout     Medications:  No anticoagulation PTA   Assessment: Zachary [name] is a 71 year old male presenting to the hospital with tachycardia and chest pain to start heparin for r/o acs. Patient's cbc is wnl. Plts 253.   Goal of Therapy:  Heparin level 0.3-0.7 units/ml Monitor platelets by anticoagulation protocol: Yes   Plan:  Heparin 4000 unit IV bolus, then begin heparin drip at 800 units/hr  F/u with 6 hour HL  Daily hl/cbc   Thank you,  Brett Fairy, PharmD, BCPS 11/19/2012 8:47 AM
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
## 2012-11-19 NOTE — H&P (Signed)
Zachary [name] is an 71 y.o. male.   Chief Complaint: Chest pain HPI: 72 years old male has intermittent chest pain since yesterday. Had mild to moderate coronary artery disease in 2011. Also had SVT converted to Sinus rhythm during attempt to put IV in right forearm.  Past Medical History  Diagnosis Date  . Hypertension   . Coronary artery disease   . Hyperlipemia   . No pertinent past medical history   . Angina   . Shortness of breath   . Mental disorder   . Arthritis   . COPD (chronic obstructive pulmonary disease)   . Hiatal hernia   . Depression   . Gout       Past Surgical History  Procedure Date  . Abdominal surgery     History reviewed. No pertinent family history. Social History:  reports that he quit smoking about 2 years ago. His smoking use included Cigarettes. He has a 15 pack-year smoking history. He does not have any smokeless tobacco history on file. He reports that he drinks about 7.2 ounces of alcohol per week. He reports that he does not use illicit drugs.  Allergies: No Known Allergies   (Not in a hospital admission)  Results for orders placed during the hospital encounter of 11/19/12 (from the past 48 hour(s))  CBC     Status: Normal   Collection Time   11/19/12  4:13 AM      Component Value Range Comment   WBC 10.2  4.0 - 10.5 K/uL    RBC 4.42  4.22 - 5.81 MIL/uL    Hemoglobin 14.3  13.0 - 17.0 g/dL    HCT 13.0  86.5 - 78.4 %    MCV 95.5  78.0 - 100.0 fL    MCH 32.4  26.0 - 34.0 pg    MCHC 33.9  30.0 - 36.0 g/dL    RDW 69.6  29.5 - 28.4 %    Platelets 253  150 - 400 K/uL   COMPREHENSIVE METABOLIC PANEL     Status: Abnormal   Collection Time   11/19/12  4:13 AM      Component Value Range Comment   Sodium 145  135 - 145 mEq/L    Potassium 3.7  3.5 - 5.1 mEq/L    Chloride 106  96 - 112 mEq/L    CO2 26  19 - 32 mEq/L    Glucose, Bld 106 (*) 70 - 99 mg/dL    BUN 9  6 - 23 mg/dL    Creatinine, Ser 1.32  0.50 - 1.35 mg/dL    Calcium 9.0  8.4  - 10.5 mg/dL    Total Protein 7.1  6.0 - 8.3 g/dL    Albumin 3.2 (*) 3.5 - 5.2 g/dL    AST 72 (*) 0 - 37 U/L    ALT 43  0 - 53 U/L    Alkaline Phosphatase 84  39 - 117 U/L    Total Bilirubin 0.3  0.3 - 1.2 mg/dL    GFR calc non Af Amer 64 (*) >90 mL/min    GFR calc Af Amer 74 (*) >90 mL/min   PROTIME-INR     Status: Normal   Collection Time   11/19/12  4:13 AM      Component Value Range Comment   Prothrombin Time 13.1  11.6 - 15.2 seconds    INR 1.00  0.00 - 1.49   APTT     Status: Normal   Collection Time  11/19/12  4:13 AM      Component Value Range Comment   aPTT 31  24 - 37 seconds   ETHANOL     Status: Abnormal   Collection Time   11/19/12  4:13 AM      Component Value Range Comment   Alcohol, Ethyl (B) 255 (*) 0 - 11 mg/dL   POCT I-STAT TROPONIN I     Status: Normal   Collection Time   11/19/12  4:45 AM      Component Value Range Comment   Troponin i, poc 0.00  0.00 - 0.08 ng/mL    Comment 3            POCT I-STAT, CHEM 8     Status: Abnormal   Collection Time   11/19/12  4:46 AM      Component Value Range Comment   Sodium 146 (*) 135 - 145 mEq/L    Potassium 3.7  3.5 - 5.1 mEq/L    Chloride 105  96 - 112 mEq/L    BUN 8  6 - 23 mg/dL    Creatinine, Ser 1.61 (*) 0.50 - 1.35 mg/dL    Glucose, Bld 096 (*) 70 - 99 mg/dL    Calcium, Ion 0.45 (*) 1.13 - 1.30 mmol/L    TCO2 28  0 - 100 mmol/L    Hemoglobin 15.6  13.0 - 17.0 g/dL    HCT 40.9  81.1 - 91.4 %   URINE RAPID DRUG SCREEN (HOSP PERFORMED)     Status: Normal   Collection Time   11/19/12  5:06 AM      Component Value Range Comment   Opiates NONE DETECTED  NONE DETECTED    Cocaine NONE DETECTED  NONE DETECTED    Benzodiazepines NONE DETECTED  NONE DETECTED    Amphetamines NONE DETECTED  NONE DETECTED    Tetrahydrocannabinol NONE DETECTED  NONE DETECTED    Barbiturates NONE DETECTED  NONE DETECTED   URINALYSIS, ROUTINE W REFLEX MICROSCOPIC     Status: Normal   Collection Time   11/19/12  5:06 AM       Component Value Range Comment   Color, Urine YELLOW  YELLOW    APPearance CLEAR  CLEAR    Specific Gravity, Urine 1.008  1.005 - 1.030    pH 5.5  5.0 - 8.0    Glucose, UA NEGATIVE  NEGATIVE mg/dL    Hgb urine dipstick NEGATIVE  NEGATIVE    Bilirubin Urine NEGATIVE  NEGATIVE    Ketones, ur NEGATIVE  NEGATIVE mg/dL    Protein, ur NEGATIVE  NEGATIVE mg/dL    Urobilinogen, UA 0.2  0.0 - 1.0 mg/dL    Nitrite NEGATIVE  NEGATIVE    Leukocytes, UA NEGATIVE  NEGATIVE MICROSCOPIC NOT DONE ON URINES WITH NEGATIVE PROTEIN, BLOOD, LEUKOCYTES, NITRITE, OR GLUCOSE <1000 mg/dL.  CG4 I-STAT (LACTIC ACID)     Status: Abnormal   Collection Time   11/19/12  5:15 AM      Component Value Range Comment   Lactic Acid, Venous 2.88 (*) 0.5 - 2.2 mmol/L    Ct Angio Chest W/cm &/or Wo Cm  11/19/2012  *RADIOLOGY REPORT*  Clinical Data: Chest pain and shortness of breath.  CT ANGIOGRAPHY CHEST  Technique:  Multidetector CT imaging of the chest using the standard protocol during bolus administration of intravenous contrast. Multiplanar reconstructed images including MIPs were obtained and reviewed to evaluate the vascular anatomy.  Contrast: 75mL OMNIPAQUE IOHEXOL 350 MG/ML SOLN  Comparison:  None  Findings: The chest wall is unremarkable.  No supraclavicular or axillary mass or adenopathy.  Small scattered lymph nodes are noted.  The thyroid gland is grossly normal.  The bony thorax is intact.  No destructive bone lesions or spinal canal compromise. Remote healed rib fractures are noted bilaterally.  The heart is normal in size.  No pericardial effusion.  No mediastinal or hilar lymphadenopathy.  Scattered lymph nodes are noted.  The aorta is normal in caliber.  Advanced atherosclerotic changes are noted.  No focal aneurysm or dissection.  There are dense coronary artery calcifications.  The pulmonary arterial tree is well opacified.  No filling defects to suggest pulmonary emboli.  Examination of the lung parenchyma  demonstrates moderate peribronchial thickening particularly in the lower lung zones. There is some associated areas of subsegmental atelectasis but no focal airspace consolidation to suggest pneumonia.  No worrisome pulmonary nodules or mass lesions.  No pulmonary edema or pleural effusion.  No findings for interstitial lung disease or significant changes of emphysema.  The upper abdomen is unremarkable.  A left adrenal gland adenoma is noted.  IMPRESSION:  1.  No CT findings for pulmonary embolism. 2.  Moderate atherosclerotic changes involving the thoracic aorta but no focal aneurysm or dissection. 3.  Dense three-vessel coronary artery calcifications. 4.  Significant peribronchial thickening likely due to bronchitis. No focal infiltrates.   Original Report Authenticated By: Rudie Meyer, M.D.    Dg Chest Portable 1 View  11/19/2012  *RADIOLOGY REPORT*  Clinical Data: Chest pain  PORTABLE CHEST - 1 VIEW  Comparison: 06/25/2012  Findings: Mild hyperinflation and bibasilar opacities.  Mild pulmonary arterial prominence.  Heart size upper normal.  No pleural effusion or pneumothorax.  No interval osseous change.  IMPRESSION: Mild nonspecific lung base opacities; atelectasis or infection.   Original Report Authenticated By: Jearld Lesch, M.D.     @ROS @  Blood pressure 113/70, pulse 92, temperature 98.7 F (37.1 C), temperature source Oral, resp. rate 27, SpO2 94.00%.  Physical Exam    Constitutional: He appears well-developed and well-nourished. No distress.  HENT: Normocephalic and atraumatic, brown eyes, Oropharynx is clear and moist. No oropharyngeal exudate.  Conjunctivae normal and EOM are normal. Pupils are equal, round, and reactive to light. No scleral icterus.  Neck: No JVD, Normal range of motion. Neck supple. No JVD present. No thyromegaly present.  Cardiovascular: Regular rhythm, normal heart sounds and intact distal pulses. Exam reveals no gallop and no friction rub. II/VI systolic  murmur. Pulmonary/Chest: Effort normal and breath sounds normal. No respiratory distress. He has no wheezes. He has no rales.  Abdominal: Soft. Bowel sounds are normal. He exhibits no distension and no mass. There is no tenderness.   Musculoskeletal: Normal range of motion. He exhibits no edema and no tenderness.  Neurological: He is alert. Coordination normal.  Skin: Skin is warm and dry. No rash noted. No erythema.  Psychiatric: He has a normal mood and affect. His behavior is normal  Assessment/Plan Chest pain SVT CAD COPD Gout  Admit/r/o MI/Home medications/C.cath or nuclear stress test  Silas Sedam S 11/19/2012, 8:29 AM

## 2012-11-20 ENCOUNTER — Inpatient Hospital Stay (HOSPITAL_COMMUNITY): Payer: Medicare Other

## 2012-11-20 ENCOUNTER — Encounter (HOSPITAL_COMMUNITY): Payer: Medicare Other

## 2012-11-20 LAB — CBC
MCH: 32 pg (ref 26.0–34.0)
Platelets: 194 10*3/uL (ref 150–400)
RBC: 4.15 MIL/uL — ABNORMAL LOW (ref 4.22–5.81)

## 2012-11-20 LAB — BASIC METABOLIC PANEL
Calcium: 9 mg/dL (ref 8.4–10.5)
GFR calc Af Amer: >90 mL/min (ref >90)
GFR calc non Af Amer: 83 mL/min — ABNORMAL LOW (ref >90)
Glucose, Bld: 102 mg/dL — ABNORMAL HIGH (ref 70–99)
Potassium: 3.7 mEq/L (ref 3.5–5.1)
Sodium: 138 mEq/L (ref 135–145)

## 2012-11-20 LAB — LIPID PANEL
Cholesterol: 132 mg/dL (ref 0–200)
HDL: 59 mg/dL (ref >39)

## 2012-11-20 LAB — PROTIME-INR
INR: 1.04 (ref 0.00–1.49)
Prothrombin Time: 13.5 seconds (ref 11.6–15.2)

## 2012-11-20 LAB — HEPARIN LEVEL (UNFRACTIONATED): Heparin Unfractionated: <0.1 IU/mL — ABNORMAL LOW (ref 0.30–0.70)

## 2012-11-20 MED ORDER — TECHNETIUM TC 99M SESTAMIBI GENERIC - CARDIOLITE
30.0000 | Freq: Once | INTRAVENOUS | Status: AC | PRN
  Administered 2012-11-20: 30 via INTRAVENOUS

## 2012-11-20 MED ORDER — TECHNETIUM TC 99M SESTAMIBI GENERIC - CARDIOLITE
10.0000 | Freq: Once | INTRAVENOUS | Status: AC | PRN
  Administered 2012-11-20: 10 via INTRAVENOUS

## 2012-11-20 MED ORDER — HEPARIN BOLUS VIA INFUSION
2000.0000 [IU] | Freq: Once | INTRAVENOUS | Status: AC
  Administered 2012-11-20: 2000 [IU] via INTRAVENOUS
  Filled 2012-11-20: qty 2000

## 2012-11-20 MED ORDER — REGADENOSON 0.4 MG/5ML IV SOLN
0.4000 mg | Freq: Once | INTRAVENOUS | Status: AC
  Administered 2012-11-20: 0.4 mg via INTRAVENOUS
  Filled 2012-11-20: qty 5

## 2012-11-20 NOTE — Progress Notes (Signed)
ANTICOAGULATION CONSULT NOTE  Pharmacy Consult for Heparin Indication: chest pain/ACS  No Known Allergies  Patient Measurements: Height: 5' 6.5" (168.9 cm) Weight: 146 lb (66.225 kg) IBW/kg (Calculated) : 64.95    Vital Signs: Temp: 98.2 F (36.8 C) (12/17 2001) Temp src: Oral (12/17 2001) BP: 139/78 mmHg (12/17 2001) Pulse Rate: 54  (12/17 2001)  Labs:  Basename 11/20/12 1914 11/19/12 2021 11/19/12 1459 11/19/12 1458 11/19/12 0826 11/19/12 0446 11/19/12 0413  HGB 13.3 -- -- -- -- 15.6 --  HCT 39.7 -- -- -- -- 46.0 42.2  PLT 194 -- -- -- -- -- 253  APTT -- -- -- -- -- -- 31  LABPROT 13.5 -- -- -- -- -- 13.1  INR 1.04 -- -- -- -- -- 1.00  HEPARINUNFRC <0.10* -- -- 0.48 -- -- --  CREATININE 0.94 -- -- -- -- 1.40* 1.14  CKTOTAL -- -- -- -- -- -- --  CKMB -- -- -- -- -- -- --  TROPONINI -- <0.30 <0.30 -- <0.30 -- --    Estimated Creatinine Clearance: 68.2 ml/min (by C-G formula based on Cr of 0.94).  Assessment: 71 year old male chest pain for heparin.  Goal of Therapy:  Heparin level 0.3-0.7 units/ml Monitor platelets by anticoagulation protocol: Yes   Plan:  Heparin 2000 units IV bolus, then increase heparin 1100 units/hr Check heparin level in 6 hours.  Geannie Risen, PharmD, BCPS 11/20/2012 5:56 AM

## 2012-11-20 NOTE — Discharge Summary (Signed)
Physician Discharge Summary  Patient ID: Zachary [name] MRN: [id number] DOB/AGE: 1941-10-21 71 y.o.  Admit date: 11/19/2012 Discharge date: 11/20/2012  Admission Diagnoses: Chest pain  SVT  CAD  COPD  Gout Alcohol use disorder Discharge Diagnoses:  Principle Problems:  * Chest pain  SVT  CAD  COPD  Gout Alcohol use disorder  Discharged Condition: good  Hospital Course: 71 years old male with known history of CAD had intermittent chest pain. He underwent nuclear stress test that showed no reversible ischemia. His medications were adjusted and he was discharged home in stable condition. He is advised to refrain from alcohol and follow up in 5 days.  Consults: None  Significant Diagnostic Studies: labs: Normal CBC and normal electrolytes. Low albumin of 3.2  Nuclear medicine: Lexiscan myoview stress test without ischemia of infarction  Treatments: anticoagulation: heparin  Discharge Exam: Blood pressure 124/79, pulse 58, temperature 98.6 F (37 C), temperature source Oral, resp. rate 17, height 5' 6.5" (1.689 m), weight 66.2 kg (145 lb 15.1 oz), SpO2 94.00%.  Constitutional: He appears well-developed and well-nourished. No distress.  HENT: Normocephalic and atraumatic, brown eyes, Oropharynx is clear and moist. No oropharyngeal exudate.  Conjunctivae normal and EOM are normal. Pupils are equal, round, and reactive to light. No scleral icterus.  Neck: No JVD, Normal range of motion. Neck supple. No JVD present. No thyromegaly present.  Cardiovascular: Regular rhythm, normal heart sounds and intact distal pulses. Exam reveals no gallop and no friction rub. II/VI systolic murmur.  Pulmonary/Chest: Effort normal and breath sounds normal. No respiratory distress. He has no wheezes. He has no rales.  Abdominal: Soft. Bowel sounds are normal. He exhibits no distension and no mass. There is no tenderness.  Musculoskeletal: Normal range of motion. He exhibits no edema and no  tenderness.  Neurological: He is alert. Coordination normal.  Skin: Skin is warm and dry. No rash noted. No erythema.  Psychiatric: He has a normal mood and affect. His behavior is normal  Disposition: 01-Home or Self Care     Medication List     As of 11/20/2012  4:26 PM    STOP taking these medications         celecoxib 200 MG capsule   Commonly known as: CELEBREX      TAKE these medications         allopurinol 300 MG tablet   Commonly known as: ZYLOPRIM   Take 1 tablet (300 mg total) by mouth 2 (two) times daily. For gout      clopidogrel 75 MG tablet   Commonly known as: PLAVIX   Take 1 tablet (75 mg total) by mouth daily. For prevention of stroke and heart attack      colchicine 0.6 MG tablet   Take 1 tablet (0.6 mg total) by mouth daily. For gout      gabapentin 300 MG capsule   Commonly known as: NEURONTIN   Take 1 capsule (300 mg total) by mouth 3 (three) times daily. For anxiety and management of pain.  May be helpful for sleep.      hydrochlorothiazide 12.5 MG capsule   Commonly known as: MICROZIDE   Take 1 capsule (12.5 mg total) by mouth daily. For control of high blood pressure      isosorbide mononitrate 30 MG 24 hr tablet   Commonly known as: IMDUR   Take 1 tablet (30 mg total) by mouth daily. To prevent chest pain.      metoprolol tartrate 25 MG tablet  Commonly known as: LOPRESSOR   Take 1 tablet (25 mg total) by mouth 2 (two) times daily. For control of high blood pressure      potassium chloride 10 MEQ CR tablet   Commonly known as: KLOR-CON   Take 1 tablet (10 mEq total) by mouth 2 (two) times daily. For potassium replacement because the hydrochlorothiazide make you lose potassium      thiamine 100 MG tablet   Take 1 tablet (100 mg total) by mouth daily. For nutritional supplementation.           Follow-up Information    Follow up with [facility], MD. In 5 days.   Contact information:   [address] [place]  [address] [phone]          Signed: Ricki Rodriguez 11/20/2012, 4:26 PM

## 2012-11-20 NOTE — Progress Notes (Signed)
Utilization review completed.  

## 2012-11-20 NOTE — Progress Notes (Signed)
  ANTICOAGULATION CONSULT NOTE - Follow Up Consult  Pharmacy Consult for heparin Indication: chest pain/ACS  No Known Allergies  Patient Measurements: Height: 5' 6.5" (168.9 cm) Weight: 145 lb 15.1 oz (66.2 kg) IBW/kg (Calculated) : 64.95  Heparin Dosing Weight: 66 kg  Vital Signs: Temp: 98.4 F (36.9 C) (12/18 0636) Temp src: Oral (12/18 0636) BP: 167/91 mmHg (12/18 1031) Pulse Rate: 64  (12/18 1225)  Labs:  Basename 11/20/12 1221 11/20/12 0438 11/19/12 2021 11/19/12 1459 11/19/12 1458 11/19/12 0826 11/19/12 0446 11/19/12 0413  HGB -- 13.3 -- -- -- -- 15.6 --  HCT -- 39.7 -- -- -- -- 46.0 42.2  PLT -- 194 -- -- -- -- -- 253  APTT -- -- -- -- -- -- -- 31  LABPROT -- 13.5 -- -- -- -- -- 13.1  INR -- 1.04 -- -- -- -- -- 1.00  HEPARINUNFRC 0.41 <0.10* -- -- 0.48 -- -- --  CREATININE -- 0.94 -- -- -- -- 1.40* 1.14  CKTOTAL -- -- -- -- -- -- -- --  CKMB -- -- -- -- -- -- -- --  TROPONINI -- -- <0.30 <0.30 -- <0.30 -- --    Estimated Creatinine Clearance: 68.2 ml/min (by C-G formula based on Cr of 0.94).  Assessment: 24 yom admitted 12/17 with cp/acs on heparin infusion, heparin level (= 0.41) at goal, cbc stable, no bleeding note. Plan for stress test today.   Goal of Therapy:  Heparin level 0.3-0.7 units/ml Monitor platelets by anticoagulation protocol: Yes   Plan:  - Continue heparin infusion at current rate  - f/u AM heparin level and cbc if pt. stays, f/u stress test result.    Bayard Hugger, PharmD, BCPS  Clinical Pharmacist  Pager: [phone]  11/20/2012,1:10 PM

## 2012-11-24 ENCOUNTER — Encounter (HOSPITAL_COMMUNITY): Payer: Self-pay | Admitting: Emergency Medicine

## 2012-11-24 ENCOUNTER — Emergency Department (HOSPITAL_COMMUNITY): Payer: Medicare Other

## 2012-11-24 ENCOUNTER — Emergency Department (HOSPITAL_COMMUNITY)
Admission: EM | Admit: 2012-11-24 | Discharge: 2012-11-24 | Disposition: A | Discharge status: Home / Self Care | Payer: Medicare Other | Attending: Emergency Medicine | Admitting: Emergency Medicine

## 2012-11-24 DIAGNOSIS — J449 Chronic obstructive pulmonary disease, unspecified: Secondary | ICD-10-CM | POA: Insufficient documentation

## 2012-11-24 DIAGNOSIS — Z8719 Personal history of other diseases of the digestive system: Secondary | ICD-10-CM | POA: Insufficient documentation

## 2012-11-24 DIAGNOSIS — R000 Tachycardia, unspecified: Secondary | ICD-10-CM | POA: Insufficient documentation

## 2012-11-24 DIAGNOSIS — E785 Hyperlipidemia, unspecified: Secondary | ICD-10-CM | POA: Insufficient documentation

## 2012-11-24 DIAGNOSIS — Z8739 Personal history of other diseases of the musculoskeletal system and connective tissue: Secondary | ICD-10-CM | POA: Insufficient documentation

## 2012-11-24 DIAGNOSIS — Z79899 Other long term (current) drug therapy: Secondary | ICD-10-CM | POA: Insufficient documentation

## 2012-11-24 DIAGNOSIS — F10229 Alcohol dependence with intoxication, unspecified: Secondary | ICD-10-CM | POA: Insufficient documentation

## 2012-11-24 DIAGNOSIS — I10 Essential (primary) hypertension: Secondary | ICD-10-CM | POA: Insufficient documentation

## 2012-11-24 DIAGNOSIS — F17200 Nicotine dependence, unspecified, uncomplicated: Secondary | ICD-10-CM | POA: Insufficient documentation

## 2012-11-24 DIAGNOSIS — Z8659 Personal history of other mental and behavioral disorders: Secondary | ICD-10-CM | POA: Insufficient documentation

## 2012-11-24 DIAGNOSIS — Z8679 Personal history of other diseases of the circulatory system: Secondary | ICD-10-CM | POA: Insufficient documentation

## 2012-11-24 DIAGNOSIS — J4489 Other specified chronic obstructive pulmonary disease: Secondary | ICD-10-CM | POA: Insufficient documentation

## 2012-11-24 DIAGNOSIS — R0789 Other chest pain: Secondary | ICD-10-CM | POA: Insufficient documentation

## 2012-11-24 DIAGNOSIS — Z8709 Personal history of other diseases of the respiratory system: Secondary | ICD-10-CM | POA: Insufficient documentation

## 2012-11-24 DIAGNOSIS — I2510 Atherosclerotic heart disease of native coronary artery without angina pectoris: Secondary | ICD-10-CM | POA: Insufficient documentation

## 2012-11-24 LAB — COMPREHENSIVE METABOLIC PANEL
AST: 78 U/L — ABNORMAL HIGH (ref 0–37)
CO2: 21 mEq/L (ref 19–32)
Calcium: 9.1 mg/dL (ref 8.4–10.5)
Chloride: 99 mEq/L (ref 96–112)
Creatinine, Ser: 1 mg/dL (ref 0.50–1.35)
GFR calc Af Amer: 87 mL/min — ABNORMAL LOW (ref >90)
GFR calc non Af Amer: 75 mL/min — ABNORMAL LOW (ref >90)
Total Protein: 7.3 g/dL (ref 6.0–8.3)

## 2012-11-24 LAB — RAPID URINE DRUG SCREEN, HOSP PERFORMED
Barbiturates: NOT DETECTED
Benzodiazepines: NOT DETECTED
Cocaine: NOT DETECTED
Opiates: NOT DETECTED

## 2012-11-24 LAB — URINALYSIS, ROUTINE W REFLEX MICROSCOPIC
Glucose, UA: NEGATIVE mg/dL
Leukocytes, UA: NEGATIVE
Nitrite: NEGATIVE
Specific Gravity, Urine: 1.007 (ref 1.005–1.030)
Urobilinogen, UA: 0.2 mg/dL (ref 0.0–1.0)

## 2012-11-24 LAB — CBC WITH DIFFERENTIAL/PLATELET
Eosinophils Relative: 1 % (ref 0–5)
Hemoglobin: 15.1 g/dL (ref 13.0–17.0)
Monocytes Relative: 6 % (ref 3–12)
Neutrophils Relative %: 35 % — ABNORMAL LOW (ref 43–77)
Platelets: 215 10*3/uL (ref 150–400)
RBC: 4.65 MIL/uL (ref 4.22–5.81)
WBC: 11.1 10*3/uL — ABNORMAL HIGH (ref 4.0–10.5)

## 2012-11-24 LAB — TROPONIN I: Troponin I: <0.3 ng/mL (ref <0.30)

## 2012-11-24 LAB — ETHANOL: Alcohol, Ethyl (B): 249 mg/dL — ABNORMAL HIGH (ref 0–11)

## 2012-11-24 MED ORDER — FOLIC ACID 1 MG PO TABS
1.0000 mg | ORAL_TABLET | Freq: Once | ORAL | Status: AC
  Administered 2012-11-24: 1 mg via ORAL
  Filled 2012-11-24: qty 1

## 2012-11-24 MED ORDER — ADULT MULTIVITAMIN W/MINERALS CH
1.0000 | ORAL_TABLET | Freq: Once | ORAL | Status: AC
  Administered 2012-11-24: 1 via ORAL
  Filled 2012-11-24: qty 1

## 2012-11-24 MED ORDER — SODIUM CHLORIDE 0.9 % IV SOLN
1000.0000 mL | INTRAVENOUS | Status: DC
  Administered 2012-11-24: 1000 mL via INTRAVENOUS

## 2012-11-24 MED ORDER — KETOROLAC TROMETHAMINE 30 MG/ML IJ SOLN
30.0000 mg | Freq: Once | INTRAMUSCULAR | Status: AC
  Administered 2012-11-24: 30 mg via INTRAVENOUS
  Filled 2012-11-24: qty 1

## 2012-11-24 MED ORDER — SODIUM CHLORIDE 0.9 % IV SOLN
1000.0000 mL | Freq: Once | INTRAVENOUS | Status: AC
  Administered 2012-11-24: 1000 mL via INTRAVENOUS

## 2012-11-24 MED ORDER — VITAMIN B-1 100 MG PO TABS
100.0000 mg | ORAL_TABLET | Freq: Once | ORAL | Status: AC
  Administered 2012-11-24: 100 mg via ORAL
  Filled 2012-11-24: qty 1

## 2012-11-24 NOTE — ED Notes (Signed)
Pt for discharge.Vital signs stable and GCS 15 

## 2012-11-24 NOTE — ED Provider Notes (Addendum)
History     CSN: [id number]  Arrival date & time 11/24/12  0304   First MD Initiated Contact with Patient 11/24/12 [phone]      Chief Complaint  Patient presents with  . Chest Pain    (Consider location/radiation/quality/duration/timing/severity/associated sxs/prior treatment) HPI 71 year old male presents to emergency room complaining of chest pain. Patient reports "this is the same damn pain I came in for 5 days ago". Patient is intoxicated. Per EMR, patient was admitted 5 days ago for chest pain. He had a stress test done by Dr. Algie Coffer. which was normal. Patient was instructed not to drink alcohol. Patient reports he's had 2 beers tonight. He is noted be tachycardic. During prior evaluation in the emergency department, he was noted to have SVT with which resolved after placement of IV. Patient had negative CT angio chest 5 days ago due to tachycardia. Patient denies any abdominal pain, no vomiting. He reports he's been eating and drinking well. Patient had shortness of breath in route with EMS and was given albuterol nebulized treatment.  Past Medical History  Diagnosis Date  . Hypertension   . Coronary artery disease   . Hyperlipemia   . Angina   . Shortness of breath   . Mental disorder   . Arthritis   . COPD (chronic obstructive pulmonary disease)   . Hiatal hernia   . Depression   . Gout     Past Surgical History  Procedure Date  . Abdominal surgery     No family history on file.  History  Substance Use Topics  . Smoking status: Current Every Day Smoker -- 1.0 packs/day for 15 years    Types: Cigarettes  . Smokeless tobacco: Former Neurosurgeon  . Alcohol Use: 7.2 oz/week    12 Cans of beer per week     Comment: pt heavy drinker      Review of Systems  Unable to perform ROS: Other  intoxicated  Allergies  Review of patient's allergies indicates no known allergies.  Home Medications   Current Outpatient Rx  Name  Route  Sig  Dispense  Refill  . ALLOPURINOL  300 MG PO TABS   Oral   Take 1 tablet (300 mg total) by mouth 2 (two) times daily. For gout   60 tablet   0   . CLOPIDOGREL BISULFATE 75 MG PO TABS   Oral   Take 1 tablet (75 mg total) by mouth daily. For prevention of stroke and heart attack   30 tablet   0   . COLCHICINE 0.6 MG PO TABS   Oral   Take 1 tablet (0.6 mg total) by mouth daily. For gout   30 tablet   0   . GABAPENTIN 300 MG PO CAPS   Oral   Take 1 capsule (300 mg total) by mouth 3 (three) times daily. For anxiety and management of pain.  May be helpful for sleep.   90 capsule   0   . HYDROCHLOROTHIAZIDE 12.5 MG PO CAPS   Oral   Take 1 capsule (12.5 mg total) by mouth daily. For control of high blood pressure   30 capsule   0   . ISOSORBIDE MONONITRATE ER 30 MG PO TB24   Oral   Take 1 tablet (30 mg total) by mouth daily. To prevent chest pain.   30 tablet   0   . METOPROLOL TARTRATE 25 MG PO TABS   Oral   Take 1 tablet (25 mg total) by mouth  2 (two) times daily. For control of high blood pressure   60 tablet   0   . POTASSIUM CHLORIDE 10 MEQ PO TBCR   Oral   Take 1 tablet (10 mEq total) by mouth 2 (two) times daily. For potassium replacement because the hydrochlorothiazide make you lose potassium   60 tablet   0   . THIAMINE HCL 100 MG PO TABS   Oral   Take 1 tablet (100 mg total) by mouth daily. For nutritional supplementation.   30 tablet   0     FOR nutritional recovery     BP 107/92  Pulse 144  Temp 97.8 F (36.6 C) (Oral)  Resp 18  SpO2 98%  Physical Exam  Nursing note and vitals reviewed. Constitutional: He appears well-developed and well-nourished.  HENT:  Head: Normocephalic and atraumatic.  Nose: Nose normal.       Dry mucous membranes  Eyes: Conjunctivae normal and EOM are normal. Pupils are equal, round, and reactive to light.  Neck: Normal range of motion. Neck supple. No JVD present. No tracheal deviation present. No thyromegaly present.  Cardiovascular: Regular  rhythm, normal heart sounds and intact distal pulses.  Exam reveals no gallop and no friction rub.   No murmur heard.      Tachycardia noted  Pulmonary/Chest: Effort normal and breath sounds normal. No stridor. No respiratory distress. He has no wheezes. He has no rales. He exhibits no tenderness.  Abdominal: Soft. Bowel sounds are normal. He exhibits no distension and no mass. There is no tenderness. There is no rebound and no guarding.  Musculoskeletal: Normal range of motion. He exhibits no edema and no tenderness.  Lymphadenopathy:    He has no cervical adenopathy.  Neurological: He is alert. He has normal reflexes. He exhibits normal muscle tone. Coordination normal.  Skin: Skin is warm and dry. No rash noted. No erythema. No pallor.  Psychiatric: He has a normal mood and affect. His behavior is normal. Judgment and thought content normal.    ED Course  Procedures (including critical care time)  Labs Reviewed  ETHANOL - Abnormal; Notable for the following:    Alcohol, Ethyl (B) 249 (*)     All other components within normal limits  COMPREHENSIVE METABOLIC PANEL - Abnormal; Notable for the following:    Albumin 3.2 (*)     AST 78 (*)     GFR calc non Af Amer 75 (*)     GFR calc Af Amer 87 (*)     All other components within normal limits  CBC WITH DIFFERENTIAL - Abnormal; Notable for the following:    WBC 11.1 (*)     Neutrophils Relative 35 (*)     Lymphocytes Relative 58 (*)     Lymphs Abs 6.4 (*)     All other components within normal limits  LIPASE, BLOOD  TROPONIN I  URINALYSIS, ROUTINE W REFLEX MICROSCOPIC  URINE RAPID DRUG SCREEN (HOSP PERFORMED)  PATHOLOGIST SMEAR REVIEW   Dg Chest Port 1 View  11/24/2012  *RADIOLOGY REPORT*  Clinical Data: Chest pain and shortness of breath.  PORTABLE CHEST - 1 VIEW  Comparison: 11/19/2012  Findings: Single view of the chest was obtained.  There is improved aeration in the lung bases compared to the previous examination. There is  no focal airspace disease.  Stable appearance of the heart and mediastinum. Old left fifth rib fracture.  IMPRESSION: Slightly improved aeration at the lung bases compared to the prior examination.  No focal chest disease.   Original Report Authenticated By: Richarda Overlie, M.D.     Date: 11/24/2012  Rate: 145  Rhythm: supraventricular tachycardia (SVT)  QRS Axis: normal  Intervals: normal  ST/T Wave abnormalities: nonspecific T wave changes  Conduction Disutrbances:none  Narrative Interpretation: tachycardia from old ekg  Old EKG Reviewed: changes noted    1. Atypical chest pain   2. Alcohol dependence with acute alcoholic intoxication   3. Tachycardia, unspecified       MDM  71 year old male with chest pain or prevent presents to emergency apartment intoxicated. He was seen in the ER and admitted for same several days ago. He had a negative stress test. Patient noted be significantly tachycardic possibly SVT, however, this resolved with fluids. Patient instructed he should stop drinking alcohol.       Olivia Mackie, MD 11/24/12 0830  Olivia Mackie, MD 11/25/12 [phone]

## 2012-11-24 NOTE — ED Notes (Signed)
Pt brought to ED with chest pain by EMS.Pt is tachycardiac and SOB.Nebs and oxygen was given.

## 2012-12-07 ENCOUNTER — Observation Stay (HOSPITAL_COMMUNITY)
Admission: EM | Admit: 2012-12-07 | Discharge: 2012-12-08 | Disposition: A | Discharge status: Home / Self Care | Payer: Medicare Other | Attending: Cardiovascular Disease | Admitting: Cardiovascular Disease

## 2012-12-07 ENCOUNTER — Emergency Department (HOSPITAL_COMMUNITY): Payer: Medicare Other

## 2012-12-07 DIAGNOSIS — R0609 Other forms of dyspnea: Secondary | ICD-10-CM | POA: Insufficient documentation

## 2012-12-07 DIAGNOSIS — M109 Gout, unspecified: Secondary | ICD-10-CM | POA: Insufficient documentation

## 2012-12-07 DIAGNOSIS — J4489 Other specified chronic obstructive pulmonary disease: Secondary | ICD-10-CM | POA: Insufficient documentation

## 2012-12-07 DIAGNOSIS — R0989 Other specified symptoms and signs involving the circulatory and respiratory systems: Secondary | ICD-10-CM | POA: Insufficient documentation

## 2012-12-07 DIAGNOSIS — J449 Chronic obstructive pulmonary disease, unspecified: Secondary | ICD-10-CM | POA: Insufficient documentation

## 2012-12-07 DIAGNOSIS — F17200 Nicotine dependence, unspecified, uncomplicated: Secondary | ICD-10-CM | POA: Insufficient documentation

## 2012-12-07 DIAGNOSIS — I498 Other specified cardiac arrhythmias: Secondary | ICD-10-CM | POA: Insufficient documentation

## 2012-12-07 DIAGNOSIS — R079 Chest pain, unspecified: Principal | ICD-10-CM | POA: Insufficient documentation

## 2012-12-07 DIAGNOSIS — F1010 Alcohol abuse, uncomplicated: Secondary | ICD-10-CM | POA: Insufficient documentation

## 2012-12-07 DIAGNOSIS — I2510 Atherosclerotic heart disease of native coronary artery without angina pectoris: Secondary | ICD-10-CM | POA: Insufficient documentation

## 2012-12-07 DIAGNOSIS — I499 Cardiac arrhythmia, unspecified: Secondary | ICD-10-CM

## 2012-12-07 DIAGNOSIS — I10 Essential (primary) hypertension: Secondary | ICD-10-CM | POA: Insufficient documentation

## 2012-12-07 LAB — CBC WITH DIFFERENTIAL/PLATELET
Eosinophils Relative: 0 % (ref 0–5)
HCT: 40.7 % (ref 39.0–52.0)
Hemoglobin: 13.7 g/dL (ref 13.0–17.0)
Lymphocytes Relative: 44 % (ref 12–46)
Lymphs Abs: 3.7 10*3/uL (ref 0.7–4.0)
MCV: 97.8 fL (ref 78.0–100.0)
Monocytes Relative: 6 % (ref 3–12)
Platelets: 202 10*3/uL (ref 150–400)
RBC: 4.16 MIL/uL — ABNORMAL LOW (ref 4.22–5.81)
WBC: 8.4 10*3/uL (ref 4.0–10.5)

## 2012-12-07 LAB — RAPID URINE DRUG SCREEN, HOSP PERFORMED
Amphetamines: NOT DETECTED
Opiates: NOT DETECTED
Tetrahydrocannabinol: NOT DETECTED

## 2012-12-07 LAB — POCT I-STAT TROPONIN I: Troponin i, poc: 0 ng/mL (ref 0.00–0.08)

## 2012-12-07 LAB — COMPREHENSIVE METABOLIC PANEL
Albumin: 3.3 g/dL — ABNORMAL LOW (ref 3.5–5.2)
BUN: 11 mg/dL (ref 6–23)
Calcium: 9.8 mg/dL (ref 8.4–10.5)
Creatinine, Ser: 0.88 mg/dL (ref 0.50–1.35)
Total Protein: 7.6 g/dL (ref 6.0–8.3)

## 2012-12-07 MED ORDER — HYDROCHLOROTHIAZIDE 12.5 MG PO CAPS
12.5000 mg | ORAL_CAPSULE | Freq: Every day | ORAL | Status: DC
  Administered 2012-12-08: 12.5 mg via ORAL
  Filled 2012-12-07: qty 1

## 2012-12-07 MED ORDER — ALLOPURINOL 300 MG PO TABS
300.0000 mg | ORAL_TABLET | Freq: Two times a day (BID) | ORAL | Status: DC
  Administered 2012-12-08 (×2): 300 mg via ORAL
  Filled 2012-12-07 (×3): qty 1

## 2012-12-07 MED ORDER — ASPIRIN 81 MG PO CHEW
324.0000 mg | CHEWABLE_TABLET | Freq: Once | ORAL | Status: AC
  Administered 2012-12-07: 324 mg via ORAL
  Filled 2012-12-07: qty 4

## 2012-12-07 MED ORDER — IPRATROPIUM BROMIDE 0.02 % IN SOLN
0.5000 mg | Freq: Once | RESPIRATORY_TRACT | Status: AC
  Administered 2012-12-07: 0.5 mg via RESPIRATORY_TRACT
  Filled 2012-12-07: qty 2.5

## 2012-12-07 MED ORDER — POTASSIUM CHLORIDE CRYS ER 10 MEQ PO TBCR
10.0000 meq | EXTENDED_RELEASE_TABLET | Freq: Two times a day (BID) | ORAL | Status: DC
  Administered 2012-12-08 (×2): 10 meq via ORAL
  Filled 2012-12-07 (×3): qty 0.5

## 2012-12-07 MED ORDER — ALBUTEROL SULFATE (5 MG/ML) 0.5% IN NEBU
5.0000 mg | INHALATION_SOLUTION | Freq: Once | RESPIRATORY_TRACT | Status: AC
  Administered 2012-12-07: 5 mg via RESPIRATORY_TRACT
  Filled 2012-12-07: qty 1

## 2012-12-07 MED ORDER — ISOSORBIDE MONONITRATE ER 30 MG PO TB24
30.0000 mg | ORAL_TABLET | Freq: Every day | ORAL | Status: DC
  Administered 2012-12-08: 30 mg via ORAL
  Filled 2012-12-07: qty 1

## 2012-12-07 MED ORDER — VITAMIN B-1 100 MG PO TABS
100.0000 mg | ORAL_TABLET | Freq: Every day | ORAL | Status: DC
  Administered 2012-12-08: 100 mg via ORAL
  Filled 2012-12-07: qty 1

## 2012-12-07 MED ORDER — GABAPENTIN 300 MG PO CAPS
300.0000 mg | ORAL_CAPSULE | Freq: Three times a day (TID) | ORAL | Status: DC
  Administered 2012-12-08 (×2): 300 mg via ORAL
  Filled 2012-12-07 (×4): qty 1

## 2012-12-07 MED ORDER — COLCHICINE 0.6 MG PO TABS
0.6000 mg | ORAL_TABLET | Freq: Every day | ORAL | Status: DC
  Administered 2012-12-08: 0.6 mg via ORAL
  Filled 2012-12-07: qty 1

## 2012-12-07 MED ORDER — HYDROCODONE-ACETAMINOPHEN 5-325 MG PO TABS
1.0000 | ORAL_TABLET | ORAL | Status: DC | PRN
  Administered 2012-12-07 – 2012-12-08 (×2): 1 via ORAL
  Filled 2012-12-07 (×3): qty 1

## 2012-12-07 NOTE — ED Notes (Signed)
Pt reports ETOH usage today 4 40 oz beers.

## 2012-12-07 NOTE — ED Provider Notes (Signed)
History     CSN: [id number]  Arrival date & time 12/07/12  1916   First MD Initiated Contact with Patient 12/07/12 1926      Chief Complaint  Patient presents with  . Chest Pain    (Consider location/radiation/quality/duration/timing/severity/associated sxs/prior treatment) HPI Zachary [name] is a 72 y.o. male who presents to ED with complaint of a chest pain. Pt states pain started 3 days ago. States has been constant since then. Not worsened or improved by anything. States pain feels like tightness. Admits to chronic cough, that is unchanged from his normal cough due to COPD, pt is still smoking. Pt states he was seen twice for the last two months and admitted once for the same. Pt denies fever, chills, malaise. No swelling of extremities.    Past Medical History  Diagnosis Date  . Hypertension   . Coronary artery disease   . Hyperlipemia   . Angina   . Shortness of breath   . Mental disorder   . Arthritis   . COPD (chronic obstructive pulmonary disease)   . Hiatal hernia   . Depression   . Gout     Past Surgical History  Procedure Date  . Abdominal surgery     No family history on file.  History  Substance Use Topics  . Smoking status: Current Every Day Smoker -- 1.0 packs/day for 15 years    Types: Cigarettes  . Smokeless tobacco: Former Neurosurgeon  . Alcohol Use: 7.2 oz/week    12 Cans of beer per week     Comment: pt heavy drinker      Review of Systems  Constitutional: Negative for fever and chills.  HENT: Negative for neck pain and neck stiffness.   Respiratory: Positive for cough, chest tightness and shortness of breath.   Cardiovascular: Positive for chest pain. Negative for palpitations and leg swelling.  Gastrointestinal: Negative.   Musculoskeletal: Negative.   Skin: Negative.   Neurological: Negative for weakness and headaches.  All other systems reviewed and are negative.    Allergies  Review of patient's allergies indicates no known  allergies.  Home Medications   Current Outpatient Rx  Name  Route  Sig  Dispense  Refill  . ALLOPURINOL 300 MG PO TABS   Oral   Take 1 tablet (300 mg total) by mouth 2 (two) times daily. For gout   60 tablet   0   . CLOPIDOGREL BISULFATE 75 MG PO TABS   Oral   Take 1 tablet (75 mg total) by mouth daily. For prevention of stroke and heart attack   30 tablet   0   . COLCHICINE 0.6 MG PO TABS   Oral   Take 1 tablet (0.6 mg total) by mouth daily. For gout   30 tablet   0   . GABAPENTIN 300 MG PO CAPS   Oral   Take 1 capsule (300 mg total) by mouth 3 (three) times daily. For anxiety and management of pain.  May be helpful for sleep.   90 capsule   0   . HYDROCHLOROTHIAZIDE 12.5 MG PO CAPS   Oral   Take 1 capsule (12.5 mg total) by mouth daily. For control of high blood pressure   30 capsule   0   . ISOSORBIDE MONONITRATE ER 30 MG PO TB24   Oral   Take 1 tablet (30 mg total) by mouth daily. To prevent chest pain.   30 tablet   0   . METOPROLOL TARTRATE  25 MG PO TABS   Oral   Take 1 tablet (25 mg total) by mouth 2 (two) times daily. For control of high blood pressure   60 tablet   0   . POTASSIUM CHLORIDE 10 MEQ PO TBCR   Oral   Take 1 tablet (10 mEq total) by mouth 2 (two) times daily. For potassium replacement because the hydrochlorothiazide make you lose potassium   60 tablet   0   . THIAMINE HCL 100 MG PO TABS   Oral   Take 1 tablet (100 mg total) by mouth daily. For nutritional supplementation.   30 tablet   0     FOR nutritional recovery     BP 111/58  Pulse 72  Temp 98.9 F (37.2 C) (Oral)  Resp 21  SpO2 96%  Physical Exam  Nursing note and vitals reviewed. Constitutional: He is oriented to person, place, and time. He appears well-developed and well-nourished. No distress.  Eyes: Conjunctivae normal are normal.  Neck: Neck supple.  Cardiovascular: Normal rate, regular rhythm and normal heart sounds.   Pulmonary/Chest: Effort normal. No  respiratory distress. He has no wheezes. He has no rales.       Decreased air movement bilaterally  Abdominal: Soft. Bowel sounds are normal. He exhibits no distension. There is no tenderness. There is no rebound.  Musculoskeletal: He exhibits no edema.  Neurological: He is oriented to person, place, and time.  Skin: Skin is warm and dry.  Psychiatric: He has a normal mood and affect. His behavior is normal.    ED Course  Procedures (including critical care time)  Pt here with atypical chest pain. Myoview negative upon admission for the same a month ago. Will get labs, ECG, chest xray.    Date: 12/07/2012  Rate: 69  Rhythm: normal sinus rhythm  QRS Axis: normal  Intervals: QT prolonged  ST/T Wave abnormalities: normal  Conduction Disutrbances:none  Narrative Interpretation:   Old EKG Reviewed: unchanged  Results for orders placed during the hospital encounter of 12/07/12  COMPREHENSIVE METABOLIC PANEL      Component Value Range   Sodium 143  135 - 145 mEq/L   Potassium 3.5  3.5 - 5.1 mEq/L   Chloride 105  96 - 112 mEq/L   CO2 27  19 - 32 mEq/L   Glucose, Bld 84  70 - 99 mg/dL   BUN 11  6 - 23 mg/dL   Creatinine, Ser 1.61  0.50 - 1.35 mg/dL   Calcium 9.8  8.4 - 09.6 mg/dL   Total Protein 7.6  6.0 - 8.3 g/dL   Albumin 3.3 (*) 3.5 - 5.2 g/dL   AST 67 (*) 0 - 37 U/L   ALT 36  0 - 53 U/L   Alkaline Phosphatase 87  39 - 117 U/L   Total Bilirubin 0.3  0.3 - 1.2 mg/dL   GFR calc non Af Amer 86 (*) >90 mL/min   GFR calc Af Amer >90  >90 mL/min  CBC WITH DIFFERENTIAL      Component Value Range   WBC 8.4  4.0 - 10.5 K/uL   RBC 4.16 (*) 4.22 - 5.81 MIL/uL   Hemoglobin 13.7  13.0 - 17.0 g/dL   HCT 04.5  40.9 - 81.1 %   MCV 97.8  78.0 - 100.0 fL   MCH 32.9  26.0 - 34.0 pg   MCHC 33.7  30.0 - 36.0 g/dL   RDW 91.4 (*) 78.2 - 95.6 %   Platelets  202  150 - 400 K/uL   Neutrophils Relative 49  43 - 77 %   Neutro Abs 4.2  1.7 - 7.7 K/uL   Lymphocytes Relative 44  12 - 46 %    Lymphs Abs 3.7  0.7 - 4.0 K/uL   Monocytes Relative 6  3 - 12 %   Monocytes Absolute 0.5  0.1 - 1.0 K/uL   Eosinophils Relative 0  0 - 5 %   Eosinophils Absolute 0.0  0.0 - 0.7 K/uL   Basophils Relative 0  0 - 1 %   Basophils Absolute 0.0  0.0 - 0.1 K/uL  URINE RAPID DRUG SCREEN (HOSP PERFORMED)      Component Value Range   Opiates NONE DETECTED  NONE DETECTED   Cocaine NONE DETECTED  NONE DETECTED   Benzodiazepines NONE DETECTED  NONE DETECTED   Amphetamines NONE DETECTED  NONE DETECTED   Tetrahydrocannabinol NONE DETECTED  NONE DETECTED   Barbiturates NONE DETECTED  NONE DETECTED  ETHANOL      Component Value Range   Alcohol, Ethyl (B) 217 (*) 0 - 11 mg/dL  POCT I-STAT TROPONIN I      Component Value Range   Troponin i, poc 0.00  0.00 - 0.08 ng/mL   Comment 3           MAGNESIUM      Component Value Range   Magnesium 2.0  1.5 - 2.5 mg/dL  TROPONIN I      Component Value Range   Troponin I <0.30  <0.30 ng/mL   Dg Chest 2 View  12/07/2012  *RADIOLOGY REPORT*  Clinical Data: Chest pain, shortness of breath, cough, chest congestion.  CHEST - 2 VIEW  Comparison: 11/24/2012.  Findings: Normal sized heart.  Clear lungs.  The lungs are hyperexpanded with flattening of the hemidiaphragms.  Mild thoracic spine degenerative changes.  IMPRESSION: Mild changes of COPD.  No acute abnormality.   Original Report Authenticated By: Beckie Salts, M.D.         1. Chest pain   2. Arrhythmia   3. Alcohol abuse       MDM  Pt with recurrent atypical chest pains. Recent, 2 wks ago, stress test that was negative, admitted by Dr. Algie Coffer. Pt is in NSR, intoxicated. Pt has had runs of wide complex tachycardia and multiple PVCs in ED. Dr. Algie Coffer was consulted and he will admit pt to obs.   Filed Vitals:   12/07/12 2345 12/08/12 0026 12/08/12 0043 12/08/12 0157  BP: 112/71  140/66 110/63  Pulse:   90 86  Temp:   98.5 F (36.9 C)   TempSrc:      Resp:      Height: 5' 6.53" (1.69 m)       Weight: 145 lb 15.1 oz (66.2 kg)     SpO2:  97% 94%           Lottie Mussel, PA 12/08/12 [phone]

## 2012-12-07 NOTE — ED Notes (Signed)
Patient arrived via [facility] with chest pain described as cramping x2 days. Denies shortness of breath, diaphoresis or nausea. EMS reports upon arrival patient's O2 Sats were 82% on RA, o2 applied and sats in the mid 90's. Patient admits to drinking 4 40oz beers today. He has alcohol on his breath. Patient is alert and oriented.

## 2012-12-07 NOTE — ED Notes (Signed)
Pt ate snack of graham crackers. Tolerated well

## 2012-12-07 NOTE — ED Notes (Signed)
Pt states that he hurts all over and is requesting "pain meds"

## 2012-12-07 NOTE — H&P (Signed)
Zachary [name] is an 72 y.o. male.   Chief Complaint: Chest pain HPI: 72 years old male has intermittent cramping type chest pain since Thursday. He had mild to moderate coronary artery disease in 2011. Also has frequent VPCs and short runs of wide complex tachycardia.  Past Medical History  Diagnosis Date  . Hypertension   . Coronary artery disease   . Hyperlipemia   . Angina   . Shortness of breath   . Mental disorder   . Arthritis   . COPD (chronic obstructive pulmonary disease)   . Hiatal hernia   . Depression   . Gout       Past Surgical History  Procedure Date  . Abdominal surgery     No family history on file. Social History:  reports that he has been smoking Cigarettes.  He has a 15 pack-year smoking history. He has quit using smokeless tobacco. He reports that he drinks about 7.2 ounces of alcohol per week. He reports that he does not use illicit drugs.  Allergies: No Known Allergies   (Not in a hospital admission)  Results for orders placed during the hospital encounter of 12/07/12 (from the past 48 hour(s))  COMPREHENSIVE METABOLIC PANEL     Status: Abnormal   Collection Time   12/07/12  7:57 PM      Component Value Range Comment   Sodium 143  135 - 145 mEq/L    Potassium 3.5  3.5 - 5.1 mEq/L    Chloride 105  96 - 112 mEq/L    CO2 27  19 - 32 mEq/L    Glucose, Bld 84  70 - 99 mg/dL    BUN 11  6 - 23 mg/dL    Creatinine, Ser 1.61  0.50 - 1.35 mg/dL    Calcium 9.8  8.4 - 09.6 mg/dL    Total Protein 7.6  6.0 - 8.3 g/dL    Albumin 3.3 (*) 3.5 - 5.2 g/dL    AST 67 (*) 0 - 37 U/L    ALT 36  0 - 53 U/L    Alkaline Phosphatase 87  39 - 117 U/L    Total Bilirubin 0.3  0.3 - 1.2 mg/dL    GFR calc non Af Amer 86 (*) >90 mL/min    GFR calc Af Amer >90  >90 mL/min   CBC WITH DIFFERENTIAL     Status: Abnormal   Collection Time   12/07/12  7:57 PM      Component Value Range Comment   WBC 8.4  4.0 - 10.5 K/uL    RBC 4.16 (*) 4.22 - 5.81 MIL/uL    Hemoglobin 13.7   13.0 - 17.0 g/dL    HCT 04.5  40.9 - 81.1 %    MCV 97.8  78.0 - 100.0 fL    MCH 32.9  26.0 - 34.0 pg    MCHC 33.7  30.0 - 36.0 g/dL    RDW 91.4 (*) 78.2 - 15.5 %    Platelets 202  150 - 400 K/uL    Neutrophils Relative 49  43 - 77 %    Neutro Abs 4.2  1.7 - 7.7 K/uL    Lymphocytes Relative 44  12 - 46 %    Lymphs Abs 3.7  0.7 - 4.0 K/uL    Monocytes Relative 6  3 - 12 %    Monocytes Absolute 0.5  0.1 - 1.0 K/uL    Eosinophils Relative 0  0 - 5 %  Eosinophils Absolute 0.0  0.0 - 0.7 K/uL    Basophils Relative 0  0 - 1 %    Basophils Absolute 0.0  0.0 - 0.1 K/uL   ETHANOL     Status: Abnormal   Collection Time   12/07/12  8:01 PM      Component Value Range Comment   Alcohol, Ethyl (B) 217 (*) 0 - 11 mg/dL   POCT I-STAT TROPONIN I     Status: Normal   Collection Time   12/07/12  8:19 PM      Component Value Range Comment   Troponin i, poc 0.00  0.00 - 0.08 ng/mL    Comment 3             Dg Chest 2 View  12/07/2012  *RADIOLOGY REPORT*  Clinical Data: Chest pain, shortness of breath, cough, chest congestion.  CHEST - 2 VIEW  Comparison: 11/24/2012.  Findings: Normal sized heart.  Clear lungs.  The lungs are hyperexpanded with flattening of the hemidiaphragms.  Mild thoracic spine degenerative changes.  IMPRESSION: Mild changes of COPD.  No acute abnormality.   Original Report Authenticated By: Beckie Salts, M.D.     @ROS @ + COPD, + Chest pain, + SVT, + Alcohol use, + Gout, + HTN, + Depression, + arthritis.  Blood pressure 111/58, pulse 72, temperature 98.9 F (37.2 C), temperature source Oral, resp. rate 21, SpO2 96.00%. Constitutional: He appears well-developed and well-nourished. No distress.  HENT: Normocephalic and atraumatic, brown eyes, Oropharynx is clear and moist. Conjunctivae normal and EOM are normal. Pupils are equal, round, and reactive to light. No scleral icterus.  Neck: No JVD, Normal range of motion. Neck supple. No JVD present. No thyromegaly present.    Cardiovascular: Regular rhythm, normal heart sounds and intact distal pulses. Exam reveals no gallop and no friction rub. II/VI systolic murmur.  Pulmonary/Chest: Effort normal and breath sounds normal. No respiratory distress. He has no wheezes. He has no rales.  Abdominal: Soft. Bowel sounds are normal. There is no tenderness.  Musculoskeletal: Normal range of motion. He exhibits no edema or cyanosis. + clubbing..  Neurological: He is alert. Coordination normal.  Skin: Skin is warm and dry. No rash noted. No erythema. Loss of distal phalanx of right index finger. Psychiatric: He has a normal mood and affect. His behavior is normal.   Assessment/Plan Chest pain CAD COPD Gout VPCs and slow VT Alcohol use disorder  Place in observation/Check electrolytes/r/o MI   [facility] S 12/07/2012, 9:57 PM

## 2012-12-07 NOTE — ED Provider Notes (Signed)
Medical screening examination/treatment/procedure(s) were conducted as a shared visit with non-physician practitioner(s) and myself.  I personally evaluated the patient during the encounter  Patient seen and examined. He describes left-sided chest pain has been intermittent lasting for minutes. Associated symptoms of dyspnea. Since he has been here he has had runs of tachyarrhythmia. Likely from his alcohol use. Will be admitted to the hospital  Toy Baker, MD 12/07/12 2120

## 2012-12-08 ENCOUNTER — Encounter (HOSPITAL_COMMUNITY): Payer: Self-pay | Admitting: *Deleted

## 2012-12-08 LAB — BASIC METABOLIC PANEL
BUN: 12 mg/dL (ref 6–23)
CO2: 27 mEq/L (ref 19–32)
Chloride: 105 mEq/L (ref 96–112)
Creatinine, Ser: 0.94 mg/dL (ref 0.50–1.35)
Glucose, Bld: 101 mg/dL — ABNORMAL HIGH (ref 70–99)

## 2012-12-08 LAB — LIPID PANEL
Cholesterol: 127 mg/dL (ref 0–200)
LDL Cholesterol: 58 mg/dL (ref 0–99)
Triglycerides: 64 mg/dL (ref <150)

## 2012-12-08 LAB — MRSA PCR SCREENING: MRSA by PCR: NEGATIVE

## 2012-12-08 LAB — CBC
HCT: 37.7 % — ABNORMAL LOW (ref 39.0–52.0)
MCH: 32.2 pg (ref 26.0–34.0)
MCHC: 33.2 g/dL (ref 30.0–36.0)
MCV: 97.2 fL (ref 78.0–100.0)
RDW: 16.1 % — ABNORMAL HIGH (ref 11.5–15.5)

## 2012-12-08 LAB — PROTIME-INR: Prothrombin Time: 13.2 seconds (ref 11.6–15.2)

## 2012-12-08 MED ORDER — ACETAMINOPHEN 325 MG PO TABS: 650.0000 mg | ORAL_TABLET | ORAL | Status: DC | PRN

## 2012-12-08 MED ORDER — ASPIRIN 81 MG PO CHEW: 324.0000 mg | CHEWABLE_TABLET | ORAL | Status: DC

## 2012-12-08 MED ORDER — ALPRAZOLAM 0.25 MG PO TABS
0.2500 mg | ORAL_TABLET | Freq: Two times a day (BID) | ORAL | Status: DC | PRN
  Filled 2012-12-08: qty 1

## 2012-12-08 MED ORDER — METOPROLOL TARTRATE 25 MG PO TABS
25.0000 mg | ORAL_TABLET | Freq: Two times a day (BID) | ORAL | Status: DC
Start: 2012-12-08 — End: 2012-12-08
  Administered 2012-12-08: 25 mg via ORAL
  Filled 2012-12-08 (×2): qty 1

## 2012-12-08 MED ORDER — METOPROLOL TARTRATE 12.5 MG HALF TABLET
12.5000 mg | ORAL_TABLET | Freq: Two times a day (BID) | ORAL | Status: DC
  Administered 2012-12-08: 12.5 mg via ORAL
  Filled 2012-12-08: qty 1

## 2012-12-08 MED ORDER — DILTIAZEM HCL 100 MG IV SOLR
5.0000 mg/h | INTRAVENOUS | Status: DC
  Filled 2012-12-08: qty 100

## 2012-12-08 MED ORDER — POTASSIUM CHLORIDE 10 MEQ PO TBCR: 20.0000 meq | EXTENDED_RELEASE_TABLET | Freq: Two times a day (BID) | ORAL | Status: DC

## 2012-12-08 MED ORDER — SIMVASTATIN 20 MG PO TABS
20.0000 mg | ORAL_TABLET | Freq: Every day | ORAL | Status: DC
  Filled 2012-12-08: qty 1

## 2012-12-08 MED ORDER — SODIUM CHLORIDE 0.9 % IJ SOLN: 3.0000 mL | INTRAMUSCULAR | Status: DC | PRN

## 2012-12-08 MED ORDER — DILTIAZEM LOAD VIA INFUSION: 10.0000 mg | Freq: Once | INTRAVENOUS | Status: DC

## 2012-12-08 MED ORDER — HEPARIN BOLUS VIA INFUSION
3000.0000 [IU] | Freq: Once | INTRAVENOUS | Status: AC
  Administered 2012-12-08: 3000 [IU] via INTRAVENOUS
  Filled 2012-12-08: qty 3000

## 2012-12-08 MED ORDER — HEPARIN (PORCINE) IN NACL 100-0.45 UNIT/ML-% IJ SOLN
1000.0000 [IU]/h | INTRAMUSCULAR | Status: DC
  Administered 2012-12-08: 1000 [IU]/h via INTRAVENOUS
  Filled 2012-12-08: qty 250

## 2012-12-08 MED ORDER — SODIUM CHLORIDE 0.9 % IJ SOLN
3.0000 mL | Freq: Two times a day (BID) | INTRAMUSCULAR | Status: DC
  Administered 2012-12-08: 3 mL via INTRAVENOUS

## 2012-12-08 MED ORDER — ASPIRIN 300 MG RE SUPP
300.0000 mg | RECTAL | Status: DC
  Filled 2012-12-08: qty 1

## 2012-12-08 MED ORDER — ASPIRIN EC 81 MG PO TBEC: 81.0000 mg | DELAYED_RELEASE_TABLET | Freq: Every day | ORAL | Status: DC

## 2012-12-08 MED ORDER — CLOPIDOGREL BISULFATE 75 MG PO TABS
75.0000 mg | ORAL_TABLET | Freq: Every day | ORAL | Status: DC
  Administered 2012-12-08: 75 mg via ORAL
  Filled 2012-12-08: qty 1

## 2012-12-08 MED ORDER — SIMVASTATIN 20 MG PO TABS: 20.0000 mg | ORAL_TABLET | Freq: Every day | ORAL | Status: DC

## 2012-12-08 MED ORDER — SODIUM CHLORIDE 0.9 % IV SOLN
250.0000 mL | INTRAVENOUS | Status: DC | PRN
  Administered 2012-12-08: 250 mL via INTRAVENOUS

## 2012-12-08 MED ORDER — ONDANSETRON HCL 4 MG/2ML IJ SOLN: 4.0000 mg | Freq: Four times a day (QID) | INTRAMUSCULAR | Status: DC | PRN

## 2012-12-08 MED ORDER — HYDROCODONE-ACETAMINOPHEN 5-325 MG PO TABS
1.0000 | ORAL_TABLET | Freq: Once | ORAL | Status: AC
  Administered 2012-12-08: 1 via ORAL

## 2012-12-08 MED ORDER — METOPROLOL TARTRATE 12.5 MG HALF TABLET
12.5000 mg | ORAL_TABLET | Freq: Once | ORAL | Status: AC
  Administered 2012-12-08: 12.5 mg via ORAL
  Filled 2012-12-08: qty 1

## 2012-12-08 MED ORDER — NITROGLYCERIN 0.4 MG SL SUBL: 0.4000 mg | SUBLINGUAL_TABLET | SUBLINGUAL | Status: DC | PRN

## 2012-12-08 MED ORDER — ZOLPIDEM TARTRATE 5 MG PO TABS: 5.0000 mg | ORAL_TABLET | Freq: Every evening | ORAL | Status: DC | PRN

## 2012-12-08 MED ORDER — DILTIAZEM LOAD VIA INFUSION
10.0000 mg | Freq: Once | INTRAVENOUS | Status: DC
  Filled 2012-12-08: qty 10

## 2012-12-08 NOTE — ED Provider Notes (Signed)
Medical screening examination/treatment/procedure(s) were conducted as a shared visit with non-physician practitioner(s) and myself.  I personally evaluated the patient during the encounter  Toy Baker, MD 12/08/12 1743

## 2012-12-08 NOTE — Progress Notes (Signed)
Pt up from the ED and having frequent runs of Vtach. Pt then went into SVT with rate maintaining in the 150's. Pt stated that he could feel the increased HR and didn't feel well. BP 106/85. Dr Algie Coffer paged and made aware of the above. Orders received and will medicate and cont to monitor.

## 2012-12-08 NOTE — Progress Notes (Signed)
Subjective:  No chest pain. Had SVT responding to vagal stimulus.  Objective:  Vital Signs in the last 24 hours: Temp:  [98.5 F (36.9 C)-98.9 F (37.2 C)] 98.5 F (36.9 C) (01/05 0043) Pulse Rate:  [72-95] 90  (01/05 0043) Cardiac Rhythm:  [-] Normal sinus rhythm (01/05 0025) Resp:  [21-23] 21  (01/04 1935) BP: (111-140)/(58-78) 140/66 mmHg (01/05 0043) SpO2:  [94 %-97 %] 94 % (01/05 0043) FiO2 (%):  [21 %] 21 % (01/05 0026) Weight:  [66.2 kg (145 lb 15.1 oz)] 66.2 kg (145 lb 15.1 oz) (01/04 2345)  Physical Exam: BP Readings from Last 1 Encounters:  12/08/12 140/66    Wt Readings from Last 1 Encounters:  12/07/12 66.2 kg (145 lb 15.1 oz)    Weight change:   HEENT: [place]/AT, Eyes-Brown, PERL, EOMI, Conjunctiva-Pink, Sclera-Non-icteric Neck: No JVD, No bruit, Trachea midline. Lungs:  Clear, Bilateral. Cardiac:  Regular rhythm, normal S1 and S2, no S3.  Abdomen:  Soft, non-tender. Extremities:  No edema present. No cyanosis. No clubbing. CNS: AxOx3, Cranial nerves grossly intact, moves all 4 extremities. Right handed. Skin: Warm and dry.   Intake/Output from previous day:      Lab Results: BMET    Component Value Date/Time   NA 143 12/07/2012 1957   K 3.5 12/07/2012 1957   CL 105 12/07/2012 1957   CO2 27 12/07/2012 1957   GLUCOSE 84 12/07/2012 1957   BUN 11 12/07/2012 1957   CREATININE 0.88 12/07/2012 1957   CALCIUM 9.8 12/07/2012 1957   GFRNONAA 86* 12/07/2012 1957   GFRAA >90 12/07/2012 1957   CBC    Component Value Date/Time   WBC 8.4 12/07/2012 1957   RBC 4.16* 12/07/2012 1957   HGB 13.7 12/07/2012 1957   HCT 40.7 12/07/2012 1957   PLT 202 12/07/2012 1957   MCV 97.8 12/07/2012 1957   MCH 32.9 12/07/2012 1957   MCHC 33.7 12/07/2012 1957   RDW 15.9* 12/07/2012 1957   LYMPHSABS 3.7 12/07/2012 1957   MONOABS 0.5 12/07/2012 1957   EOSABS 0.0 12/07/2012 1957   BASOSABS 0.0 12/07/2012 1957   CARDIAC ENZYMES Lab Results  Component Value Date   CKTOTAL 26 05/02/2011   CKMB 0.9 05/02/2011   TROPONINI  <0.30 12/08/2012    Scheduled Meds:   . allopurinol  300 mg Oral BID  . aspirin  324 mg Oral NOW   Or  . aspirin  300 mg Rectal NOW  . aspirin EC  81 mg Oral Daily  . clopidogrel  75 mg Oral Q breakfast  . colchicine  0.6 mg Oral Daily  . gabapentin  300 mg Oral TID  . hydrochlorothiazide  12.5 mg Oral Daily  . isosorbide mononitrate  30 mg Oral Daily  . metoprolol tartrate  12.5 mg Oral Once  . metoprolol tartrate  25 mg Oral BID  . potassium chloride  10 mEq Oral BID  . simvastatin  20 mg Oral q1800  . sodium chloride  3 mL Intravenous Q12H  . thiamine  100 mg Oral Daily   Continuous Infusions:   . heparin 1,000 Units/hr (12/08/12 0103)   PRN Meds:.sodium chloride, acetaminophen, ALPRAZolam, HYDROcodone-acetaminophen, nitroGLYCERIN, ondansetron (ZOFRAN) IV, sodium chloride, zolpidem  Assessment/Plan:  Patient Active Hospital Problem List: Chest pain  CAD  COPD  Gout  VPCs and slow VT  Alcohol use disorder SVT  Increase metoprolol to 25 mg. bid.   LOS: 1 day    Orpah Cobb  MD  12/08/2012, 1:50 AM

## 2012-12-08 NOTE — Progress Notes (Signed)
Discharge instructions reviewed with patient and prescriptions given.  Pt. Very anxious to leave.  States is "dying" for a cigarette.  Pt. Cautioned re:  Intake of stimulants.  List of possible stimulants given to patient.  Attempted teach back with patient, but he was not willing to take part in process.  States, "I just want to get out of here"!  Pt. Escorted to exit by Clinical research associate.  Given bus pass by LCSW in order to go home to his private residence.

## 2012-12-08 NOTE — Progress Notes (Signed)
ANTICOAGULATION CONSULT NOTE - Initial Consult  Pharmacy Consult for heparin Indication: chest pain/ACS  No Known Allergies  Patient Measurements: Height: 5' 6.53" (169 cm) Weight: 145 lb 15.1 oz (66.2 kg) IBW/kg (Calculated) : 65.03   Vital Signs: Temp: 98.9 F (37.2 C) (01/04 1935) Temp src: Oral (01/04 1935) BP: 112/71 mmHg (01/04 2345) Pulse Rate: 95  (01/04 2315)  Labs:  Basename 12/07/12 1957  HGB 13.7  HCT 40.7  PLT 202  APTT --  LABPROT --  INR --  HEPARINUNFRC --  CREATININE 0.88  CKTOTAL --  CKMB --  TROPONINI --    Estimated Creatinine Clearance: 72.8 ml/min (by C-G formula based on Cr of 0.88).   Medical History: Past Medical History  Diagnosis Date  . Hypertension   . Coronary artery disease   . Hyperlipemia   . Angina   . Shortness of breath   . Mental disorder   . Arthritis   . COPD (chronic obstructive pulmonary disease)   . Hiatal hernia   . Depression   . Gout     Medications:  Scheduled:    . [COMPLETED] albuterol  5 mg Nebulization Once  . allopurinol  300 mg Oral BID  . [COMPLETED] aspirin  324 mg Oral Once  . colchicine  0.6 mg Oral Daily  . gabapentin  300 mg Oral TID  . hydrochlorothiazide  12.5 mg Oral Daily  . [COMPLETED] ipratropium  0.5 mg Nebulization Once  . isosorbide mononitrate  30 mg Oral Daily  . potassium chloride  10 mEq Oral BID  . thiamine  100 mg Oral Daily    Assessment: 72yo male c/o "cramping" CP x2d, admits to drinking four 40-oz beers today; pt was admitted recently for same, stress test revealed no reversible ischemia; to begin heparin.  Goal of Therapy:  Heparin level 0.3-0.7 units/ml Monitor platelets by anticoagulation protocol: Yes   Plan:  Will give heparin 3000 units x1 followed by gtt at 1000 units/hr (was previously therapeutic at 1100 units/hr though hospital had different heparin gtt bags then) and monitor heparin levels and CBC.  Colleen Can PharmD BCPS 12/08/2012,12:03  AM

## 2012-12-08 NOTE — Progress Notes (Signed)
Pt spontaneously converted back to SR with PVC's with a cold rag on his head. Dr Algie Coffer on unit and made aware that cardizem not started and that hr back to normal. Orders received and entered per MD. Will medicate and cont to monitor.

## 2012-12-08 NOTE — Discharge Summary (Signed)
Physician Discharge Summary  Patient ID: Zachary [name] MRN: [id number] DOB/AGE: 1941-01-02 72 y.o.  Admit date: 12/07/2012 Discharge date: 12/08/2012  Admission Diagnoses: Chest pain  CAD  COPD  Gout  VPCs and slow VT  Alcohol use disorder    Discharge Diagnoses:  Prionciple Problem:  *Chest pain*  CAD  COPD  Gout  VPCs and slow VT  Alcohol use disorder  SVT   Discharged Condition: good  Hospital Course: 72 years old male has intermittent cramping type chest pain since Thursday. He had mild to moderate coronary artery disease in 2011. Also has frequent VPCs and short runs of wide complex tachycardia. He had SVT responding to vagal maneuver. He was advised to refrain from alcohol and stimulants. He was given 1 week follow up.  Consults: None  Significant Diagnostic Studies: labs: Normal electrolytes and Creatinine. Near normal CBC. Alcohol 217 mg/dl  Treatments: cardiac meds: metoprolol  Discharge Exam: Blood pressure 115/70, pulse 67, temperature 98 F (36.7 C), temperature source Oral, resp. rate 20, height 5' 6.54" (1.69 m), weight 66.2 kg (145 lb 15.1 oz), SpO2 99.00%.  Constitutional: He appears well-developed and well-nourished. No distress.  HENT: Normocephalic and atraumatic, brown eyes, Oropharynx is clear and moist. Conjunctivae normal and EOM are normal. Pupils are equal, round, and reactive to light. No scleral icterus.  Neck: No JVD, Normal range of motion. Neck supple. No JVD present. No thyromegaly present.  Cardiovascular: Regular rhythm, normal heart sounds and intact distal pulses. Exam reveals no gallop and no friction rub. II/VI systolic murmur.  Pulmonary/Chest: Effort normal and breath sounds normal. No respiratory distress. He has no wheezes. He has no rales.  Abdominal: Soft. Bowel sounds are normal. There is no tenderness.  Musculoskeletal: Normal range of motion. He exhibits no edema or cyanosis. + clubbing..  Neurological: He is alert.  Coordination normal.  Skin: Skin is warm and dry. No rash noted. No erythema. Loss of distal phalanx of right index finger. Psychiatric: He has a normal mood and affect. His behavior is normal.  Disposition: 01-Home or Self Care     Medication List     As of 12/08/2012  2:49 PM    TAKE these medications         allopurinol 300 MG tablet   Commonly known as: ZYLOPRIM   Take 1 tablet (300 mg total) by mouth 2 (two) times daily. For gout      clopidogrel 75 MG tablet   Commonly known as: PLAVIX   Take 1 tablet (75 mg total) by mouth daily. For prevention of stroke and heart attack      colchicine 0.6 MG tablet   Take 1 tablet (0.6 mg total) by mouth daily. For gout      gabapentin 300 MG capsule   Commonly known as: NEURONTIN   Take 1 capsule (300 mg total) by mouth 3 (three) times daily. For anxiety and management of pain.  May be helpful for sleep.      hydrochlorothiazide 12.5 MG capsule   Commonly known as: MICROZIDE   Take 1 capsule (12.5 mg total) by mouth daily. For control of high blood pressure      isosorbide mononitrate 30 MG 24 hr tablet   Commonly known as: IMDUR   Take 1 tablet (30 mg total) by mouth daily. To prevent chest pain.      metoprolol tartrate 25 MG tablet   Commonly known as: LOPRESSOR   Take 1 tablet (25 mg total) by mouth 2 (two)  times daily. For control of high blood pressure      potassium chloride 10 MEQ CR tablet   Commonly known as: KLOR-CON   Take 2 tablets (20 mEq total) by mouth 2 (two) times daily. For potassium replacement because the hydrochlorothiazide make you lose potassium      simvastatin 20 MG tablet   Commonly known as: ZOCOR   Take 1 tablet (20 mg total) by mouth at bedtime.      thiamine 100 MG tablet   Take 1 tablet (100 mg total) by mouth daily. For nutritional supplementation.           Follow-up Information    Follow up with [facility] S, MD. Schedule an appointment as soon as possible for a visit in 1 week.    Contact information:   [address] [place] [address] [phone]          Signed: Ricki Rodriguez 12/08/2012, 2:49 PM

## 2012-12-08 NOTE — Progress Notes (Signed)
ANTICOAGULATION CONSULT NOTE - Follow Up Consult  Pharmacy Consult for Heparin Indication: chest pain/ACS  No Known Allergies  Patient Measurements: Height: 5' 6.53" (169 cm) Weight: 145 lb 15.1 oz (66.2 kg) IBW/kg (Calculated) : 65.03   Vital Signs: Temp: 98.3 F (36.8 C) (01/05 0500) BP: 102/56 mmHg (01/05 0500) Pulse Rate: 67  (01/05 0500)  Labs:  Basename 12/08/12 0530 12/08/12 0032 12/07/12 1957  HGB 12.5* -- 13.7  HCT 37.7* -- 40.7  PLT 191 -- 202  APTT -- -- --  LABPROT 13.2 -- --  INR 1.01 -- --  HEPARINUNFRC 0.48 -- --  CREATININE 0.94 -- 0.88  CKTOTAL -- -- --  CKMB -- -- --  TROPONINI <0.30 <0.30 --   Estimated Creatinine Clearance: 68.2 ml/min (by C-G formula based on Cr of 0.94).   Assessment: 72yo male admitted with intermittent "cramping" chest pain being continued on IV heparin. Heparin drip started overnight (3000 units bolus x 1, then 1000 units/hr) . First 6-hr heparin level therapeutic at 0.48 units/ml -- however lab drawn a bit early. Will order a 6 hour level to confirm therapeutic. No bleeding noted in chart.  Goal of Therapy:  Heparin level 0.3-0.7 units/ml Monitor platelets by anticoagulation protocol: Yes   Plan:  1. Continue IV heparin at current rate of 1000 units/hr 2. Follow-up 6-hr heparin level to confirm therapeutic 3. Monitor signs/symtpoms of bleeding, daily CBC, heparin level, cardiology plans  Benjaman Pott, PharmD    12/08/2012   8:55 AM

## 2012-12-13 ENCOUNTER — Encounter (HOSPITAL_COMMUNITY): Payer: Self-pay | Admitting: Emergency Medicine

## 2012-12-13 ENCOUNTER — Emergency Department (HOSPITAL_COMMUNITY)
Admission: EM | Admit: 2012-12-13 | Discharge: 2012-12-13 | Disposition: A | Discharge status: Home / Self Care | Payer: Medicare Other | Attending: Emergency Medicine | Admitting: Emergency Medicine

## 2012-12-13 ENCOUNTER — Emergency Department (HOSPITAL_COMMUNITY): Payer: Medicare Other

## 2012-12-13 DIAGNOSIS — I10 Essential (primary) hypertension: Secondary | ICD-10-CM | POA: Insufficient documentation

## 2012-12-13 DIAGNOSIS — J4489 Other specified chronic obstructive pulmonary disease: Secondary | ICD-10-CM | POA: Insufficient documentation

## 2012-12-13 DIAGNOSIS — Z8739 Personal history of other diseases of the musculoskeletal system and connective tissue: Secondary | ICD-10-CM | POA: Insufficient documentation

## 2012-12-13 DIAGNOSIS — Z8679 Personal history of other diseases of the circulatory system: Secondary | ICD-10-CM | POA: Insufficient documentation

## 2012-12-13 DIAGNOSIS — J449 Chronic obstructive pulmonary disease, unspecified: Secondary | ICD-10-CM | POA: Insufficient documentation

## 2012-12-13 DIAGNOSIS — Z8659 Personal history of other mental and behavioral disorders: Secondary | ICD-10-CM | POA: Insufficient documentation

## 2012-12-13 DIAGNOSIS — Z7901 Long term (current) use of anticoagulants: Secondary | ICD-10-CM | POA: Insufficient documentation

## 2012-12-13 DIAGNOSIS — I498 Other specified cardiac arrhythmias: Secondary | ICD-10-CM | POA: Insufficient documentation

## 2012-12-13 DIAGNOSIS — Z79899 Other long term (current) drug therapy: Secondary | ICD-10-CM | POA: Insufficient documentation

## 2012-12-13 DIAGNOSIS — F17200 Nicotine dependence, unspecified, uncomplicated: Secondary | ICD-10-CM | POA: Insufficient documentation

## 2012-12-13 DIAGNOSIS — I471 Supraventricular tachycardia: Secondary | ICD-10-CM

## 2012-12-13 DIAGNOSIS — R079 Chest pain, unspecified: Secondary | ICD-10-CM | POA: Insufficient documentation

## 2012-12-13 DIAGNOSIS — E785 Hyperlipidemia, unspecified: Secondary | ICD-10-CM | POA: Insufficient documentation

## 2012-12-13 DIAGNOSIS — I2510 Atherosclerotic heart disease of native coronary artery without angina pectoris: Secondary | ICD-10-CM | POA: Insufficient documentation

## 2012-12-13 DIAGNOSIS — Z8719 Personal history of other diseases of the digestive system: Secondary | ICD-10-CM | POA: Insufficient documentation

## 2012-12-13 LAB — URINALYSIS, ROUTINE W REFLEX MICROSCOPIC
Ketones, ur: NEGATIVE mg/dL
Leukocytes, UA: NEGATIVE
Nitrite: NEGATIVE
Protein, ur: NEGATIVE mg/dL
Urobilinogen, UA: 0.2 mg/dL (ref 0.0–1.0)

## 2012-12-13 LAB — CBC WITH DIFFERENTIAL/PLATELET
Basophils Relative: 0 % (ref 0–1)
HCT: 41.7 % (ref 39.0–52.0)
Hemoglobin: 14 g/dL (ref 13.0–17.0)
MCHC: 33.6 g/dL (ref 30.0–36.0)
MCV: 97.4 fL (ref 78.0–100.0)
Monocytes Absolute: 0.9 10*3/uL (ref 0.1–1.0)
Monocytes Relative: 11 % (ref 3–12)
Neutro Abs: 3.5 10*3/uL (ref 1.7–7.7)

## 2012-12-13 LAB — COMPREHENSIVE METABOLIC PANEL
Albumin: 3.2 g/dL — ABNORMAL LOW (ref 3.5–5.2)
BUN: 8 mg/dL (ref 6–23)
CO2: 26 mEq/L (ref 19–32)
Chloride: 104 mEq/L (ref 96–112)
Creatinine, Ser: 0.9 mg/dL (ref 0.50–1.35)
GFR calc non Af Amer: 85 mL/min — ABNORMAL LOW (ref >90)
Total Bilirubin: 0.3 mg/dL (ref 0.3–1.2)

## 2012-12-13 LAB — MAGNESIUM: Magnesium: 2 mg/dL (ref 1.5–2.5)

## 2012-12-13 LAB — POCT I-STAT, CHEM 8
BUN: 12 mg/dL (ref 6–23)
Chloride: 101 mEq/L (ref 96–112)
Creatinine, Ser: 0.7 mg/dL (ref 0.50–1.35)
Potassium: 3.9 mEq/L (ref 3.5–5.1)
Sodium: 140 mEq/L (ref 135–145)

## 2012-12-13 MED ORDER — DILTIAZEM HCL 25 MG/5ML IV SOLN: 15.0000 mg | Freq: Once | INTRAVENOUS | Status: DC

## 2012-12-13 MED ORDER — ADENOSINE 6 MG/2ML IV SOLN
6.0000 mg | Freq: Once | INTRAVENOUS | Status: AC
  Administered 2012-12-13: 6 mg via INTRAVENOUS
  Filled 2012-12-13: qty 2

## 2012-12-13 MED ORDER — SODIUM CHLORIDE 0.9 % IV BOLUS (SEPSIS)
1000.0000 mL | Freq: Once | INTRAVENOUS | Status: AC
  Administered 2012-12-13: 1000 mL via INTRAVENOUS

## 2012-12-13 MED ORDER — VITAMIN B-1 100 MG PO TABS
100.0000 mg | ORAL_TABLET | Freq: Once | ORAL | Status: AC
  Administered 2012-12-13: 100 mg via ORAL
  Filled 2012-12-13: qty 1

## 2012-12-13 NOTE — ED Provider Notes (Addendum)
History     CSN: [id number]  Arrival date & time 12/13/12  0024   First MD Initiated Contact with Patient 12/13/12 0044      Chief Complaint  Patient presents with  . Palpitations    (Consider location/radiation/quality/duration/timing/severity/associated sxs/prior treatment) HPI Comments: Pt with hx of CAD, alcohol use, PVC comes in with cc of palpitations. Pt had a recent admission for chest pain, PCP note indicated recent negative workup. Pt was also noted to have SVT with PVC, responding to vagal maneuver. Current sx started around 11 pm, without any stress. Pt started having some palpitations with midsternal chest pain. No sob, nausea, diaphoresis, dizziness. Pt has been taking all his meds as prescribed. Denies any recent infection, illicit use. Chest pain is mid sternal, similar to his previous visit.   Patient is a 72 y.o. male presenting with palpitations. The history is provided by the patient.  Palpitations  Associated symptoms include chest pain. Pertinent negatives include no fever, no headaches, no dizziness, no cough and no shortness of breath.    Past Medical History  Diagnosis Date  . Hypertension   . Coronary artery disease   . Hyperlipemia   . Angina   . Shortness of breath   . Mental disorder   . Arthritis   . COPD (chronic obstructive pulmonary disease)   . Hiatal hernia   . Depression   . Gout     Past Surgical History  Procedure Date  . Abdominal surgery     No family history on file.  History  Substance Use Topics  . Smoking status: Current Every Day Smoker -- 1.0 packs/day for 15 years    Types: Cigarettes  . Smokeless tobacco: Former Neurosurgeon  . Alcohol Use: 7.2 oz/week    12 Cans of beer per week     Comment: pt heavy drinker      Review of Systems  Constitutional: Negative for fever, chills and activity change.  HENT: Negative for neck pain.   Eyes: Negative for visual disturbance.  Respiratory: Negative for cough, chest tightness  and shortness of breath.   Cardiovascular: Positive for chest pain and palpitations.  Gastrointestinal: Negative for abdominal distention.  Genitourinary: Negative for dysuria, enuresis and difficulty urinating.  Musculoskeletal: Negative for arthralgias.  Neurological: Negative for dizziness, light-headedness and headaches.  Hematological: Does not bruise/bleed easily.  Psychiatric/Behavioral: Negative for confusion.    Allergies  Review of patient's allergies indicates no known allergies.  Home Medications   Current Outpatient Rx  Name  Route  Sig  Dispense  Refill  . ALLOPURINOL 300 MG PO TABS   Oral   Take 1 tablet (300 mg total) by mouth 2 (two) times daily. For gout   60 tablet   0   . CLOPIDOGREL BISULFATE 75 MG PO TABS   Oral   Take 1 tablet (75 mg total) by mouth daily. For prevention of stroke and heart attack   30 tablet   0   . COLCHICINE 0.6 MG PO TABS   Oral   Take 1 tablet (0.6 mg total) by mouth daily. For gout   30 tablet   0   . GABAPENTIN 300 MG PO CAPS   Oral   Take 1 capsule (300 mg total) by mouth 3 (three) times daily. For anxiety and management of pain.  May be helpful for sleep.   90 capsule   0   . HYDROCHLOROTHIAZIDE 12.5 MG PO CAPS   Oral   Take 1 capsule (  12.5 mg total) by mouth daily. For control of high blood pressure   30 capsule   0   . ISOSORBIDE MONONITRATE ER 30 MG PO TB24   Oral   Take 1 tablet (30 mg total) by mouth daily. To prevent chest pain.   30 tablet   0   . METOPROLOL TARTRATE 25 MG PO TABS   Oral   Take 1 tablet (25 mg total) by mouth 2 (two) times daily. For control of high blood pressure   60 tablet   0   . POTASSIUM CHLORIDE 10 MEQ PO TBCR   Oral   Take 2 tablets (20 mEq total) by mouth 2 (two) times daily. For potassium replacement because the hydrochlorothiazide make you lose potassium   60 tablet   0   . SIMVASTATIN 20 MG PO TABS   Oral   Take 1 tablet (20 mg total) by mouth at bedtime.   30  tablet   1   . THIAMINE HCL 100 MG PO TABS   Oral   Take 1 tablet (100 mg total) by mouth daily. For nutritional supplementation.   30 tablet   0     FOR nutritional recovery     BP 121/96  Pulse 65  Temp 98.7 F (37.1 C) (Oral)  Resp 24  SpO2 94%  Physical Exam  Nursing note reviewed. Constitutional: He is oriented to person, place, and time. He appears well-developed.  HENT:  Head: Normocephalic and atraumatic.  Eyes: Conjunctivae normal and EOM are normal. Pupils are equal, round, and reactive to light.  Neck: Normal range of motion. Neck supple. No JVD present.  Cardiovascular: Normal rate.   Murmur heard.      Irregularly irregular, tachycardia  Pulmonary/Chest: Effort normal and breath sounds normal. No respiratory distress. He has no wheezes.  Abdominal: Soft. Bowel sounds are normal. He exhibits no distension. There is no tenderness. There is no rebound and no guarding.  Musculoskeletal: He exhibits no edema and no tenderness.  Neurological: He is alert and oriented to person, place, and time.  Skin: Skin is warm.    ED Course  CARDIOVERSION Date/Time: 12/13/2012 2:43 AM Performed by: Derwood Kaplan Authorized by: Derwood Kaplan Consent: Verbal consent obtained. Risks and benefits: risks, benefits and alternatives were discussed Consent given by: patient Required items: required blood products, implants, devices, and special equipment available Patient identity confirmed: verbally with patient Time out: Immediately prior to procedure a "time out" was called to verify the correct patient, procedure, equipment, support staff and site/side marked as required. Patient sedated: no Cardioversion basis: elective Electrodes: pads Electrodes placed: anterior-posterior Post-procedure rhythm: normal sinus rhythm Complications: no complications Comments: CHEMICAL CARDIOVERSION PERFORMED WITH ADENOSINE. PT HAD UNDIFFERENTIATED TACHYCARDIA, NARROW COMPLEX WHICH  CONVERTED TO SINUS RHYTHM.   (including critical care time)  Labs Reviewed  CBC WITH DIFFERENTIAL - Abnormal; Notable for the following:    RDW 16.0 (*)     Neutrophils Relative 41 (*)     Lymphocytes Relative 47 (*)     Lymphs Abs 4.1 (*)     All other components within normal limits  COMPREHENSIVE METABOLIC PANEL - Abnormal; Notable for the following:    Glucose, Bld 103 (*)     Albumin 3.2 (*)     AST 57 (*)     GFR calc non Af Amer 85 (*)     All other components within normal limits  URINALYSIS, ROUTINE W REFLEX MICROSCOPIC - Abnormal; Notable for the following:  APPearance CLOUDY (*)     All other components within normal limits  MAGNESIUM  POCT I-STAT TROPONIN I   Dg Chest 2 View  12/13/2012  *RADIOLOGY REPORT*  Clinical Data: Chest pain and palpitations.  CHEST - 2 VIEW  Comparison: PA and lateral chest 12/07/2012. CT chest 11/19/2012.  Findings: The chest is hyperexpanded with flattening of the hemidiaphragms and enlargement of the retrosternal air space. Lungs are clear.  No pneumothorax or pleural fluid.  Remote left fifth rib fracture is noted.  IMPRESSION: Emphysema without acute disease.   Original Report Authenticated By: Holley Dexter, M.D.      No diagnosis found.    MDM   Date: 12/13/2012  Rate: 155  Rhythm: indeterminate, PVC  QRS Axis: normal  Intervals: normal  ST/T Wave abnormalities: nonspecific ST/T changes  Conduction Disutrbances:none  Narrative Interpretation:   Old EKG Reviewed: changes noted  Pt comes in with cc of palpitations and some chest discomfort. Pt has SVT, responding to vagal maneuvers - didn't respond for [place]. He is having some chest pain, but hemodynamically he is stable and WNL outside of tachycardia. Will get basic labs. Will need to give adenosine to differentiate between SVT and afib with rvr.   2:46 AM Responded to adenosine. Will monitor.   Date: 12/13/2012  Rate: 97  Rhythm: normal sinus rhythm, PVC  QRS  Axis: normal  Intervals: normal  ST/T Wave abnormalities: normal  Conduction Disutrbances: none  Narrative Interpretation: unremarkable      Derwood Kaplan, MD 12/13/12 0246  6:33 AM Chest pain free. HR has stayed stable now for more than 2 hours. Ready for discharge.  Derwood Kaplan, MD 12/13/12 [phone]

## 2012-12-13 NOTE — ED Notes (Signed)
Patient transported to X-ray 

## 2012-12-13 NOTE — ED Notes (Signed)
PT. ARRIVED WITH EMS FROM HOME REPORTS PALPITATIONS AND LEFT SIDE CHEST PAIN ONSET THIS EVENING WITH SLIGHT SOB , DENIES NAUSEA OR DIAPHORESIS , RECEIVED 4 BABY ASA PTA BY EMS , HISTORY OF ATRIAL FIBRILLATION .

## 2012-12-15 ENCOUNTER — Emergency Department (HOSPITAL_COMMUNITY): Payer: Medicare Other

## 2012-12-15 ENCOUNTER — Emergency Department (HOSPITAL_COMMUNITY)
Admission: EM | Admit: 2012-12-15 | Discharge: 2012-12-15 | Disposition: A | Discharge status: Home / Self Care | Payer: Medicare Other | Attending: Emergency Medicine | Admitting: Emergency Medicine

## 2012-12-15 ENCOUNTER — Encounter (HOSPITAL_COMMUNITY): Payer: Self-pay

## 2012-12-15 DIAGNOSIS — Z7901 Long term (current) use of anticoagulants: Secondary | ICD-10-CM | POA: Insufficient documentation

## 2012-12-15 DIAGNOSIS — F1020 Alcohol dependence, uncomplicated: Secondary | ICD-10-CM | POA: Insufficient documentation

## 2012-12-15 DIAGNOSIS — Z8679 Personal history of other diseases of the circulatory system: Secondary | ICD-10-CM | POA: Insufficient documentation

## 2012-12-15 DIAGNOSIS — I499 Cardiac arrhythmia, unspecified: Secondary | ICD-10-CM

## 2012-12-15 DIAGNOSIS — F17200 Nicotine dependence, unspecified, uncomplicated: Secondary | ICD-10-CM | POA: Insufficient documentation

## 2012-12-15 DIAGNOSIS — M109 Gout, unspecified: Secondary | ICD-10-CM | POA: Insufficient documentation

## 2012-12-15 DIAGNOSIS — J4489 Other specified chronic obstructive pulmonary disease: Secondary | ICD-10-CM | POA: Insufficient documentation

## 2012-12-15 DIAGNOSIS — J449 Chronic obstructive pulmonary disease, unspecified: Secondary | ICD-10-CM | POA: Insufficient documentation

## 2012-12-15 DIAGNOSIS — F329 Major depressive disorder, single episode, unspecified: Secondary | ICD-10-CM | POA: Insufficient documentation

## 2012-12-15 DIAGNOSIS — I10 Essential (primary) hypertension: Secondary | ICD-10-CM | POA: Insufficient documentation

## 2012-12-15 DIAGNOSIS — Z8739 Personal history of other diseases of the musculoskeletal system and connective tissue: Secondary | ICD-10-CM | POA: Insufficient documentation

## 2012-12-15 DIAGNOSIS — E785 Hyperlipidemia, unspecified: Secondary | ICD-10-CM | POA: Insufficient documentation

## 2012-12-15 DIAGNOSIS — I498 Other specified cardiac arrhythmias: Secondary | ICD-10-CM | POA: Insufficient documentation

## 2012-12-15 DIAGNOSIS — Z8709 Personal history of other diseases of the respiratory system: Secondary | ICD-10-CM | POA: Insufficient documentation

## 2012-12-15 DIAGNOSIS — Z8719 Personal history of other diseases of the digestive system: Secondary | ICD-10-CM | POA: Insufficient documentation

## 2012-12-15 DIAGNOSIS — I2510 Atherosclerotic heart disease of native coronary artery without angina pectoris: Secondary | ICD-10-CM | POA: Insufficient documentation

## 2012-12-15 DIAGNOSIS — F3289 Other specified depressive episodes: Secondary | ICD-10-CM | POA: Insufficient documentation

## 2012-12-15 DIAGNOSIS — R079 Chest pain, unspecified: Secondary | ICD-10-CM | POA: Insufficient documentation

## 2012-12-15 LAB — POCT I-STAT, CHEM 8
BUN: 12 mg/dL (ref 6–23)
Calcium, Ion: 1.23 mmol/L (ref 1.13–1.30)
Chloride: 107 mEq/L (ref 96–112)
Creatinine, Ser: 1.4 mg/dL — ABNORMAL HIGH (ref 0.50–1.35)
Glucose, Bld: 82 mg/dL (ref 70–99)

## 2012-12-15 LAB — CBC
HCT: 41.3 % (ref 39.0–52.0)
Hemoglobin: 13.9 g/dL (ref 13.0–17.0)
MCH: 32.9 pg (ref 26.0–34.0)
MCV: 97.9 fL (ref 78.0–100.0)
RBC: 4.22 MIL/uL (ref 4.22–5.81)

## 2012-12-15 LAB — TROPONIN I: Troponin I: <0.3 ng/mL (ref <0.30)

## 2012-12-15 LAB — POCT I-STAT TROPONIN I: Troponin i, poc: 0.02 ng/mL (ref 0.00–0.08)

## 2012-12-15 MED ORDER — SODIUM CHLORIDE 0.9 % IV SOLN
INTRAVENOUS | Status: DC
  Administered 2012-12-15: 01:00:00 via INTRAVENOUS

## 2012-12-15 MED ORDER — ADENOSINE 6 MG/2ML IV SOLN
12.0000 mg | Freq: Once | INTRAVENOUS | Status: AC
  Administered 2012-12-15: 12 mg via INTRAVENOUS
  Filled 2012-12-15: qty 4

## 2012-12-15 NOTE — ED Notes (Signed)
Per EMS, pt here with c/o chest pain starting 1.5 hours ago. Pt. Reports constant pressure with radiation to back and neck and SOB. Pt given 324 ASA and 2 nitro with no relief. ST at 145. ETOH.

## 2012-12-15 NOTE — ED Notes (Signed)
Adenosine given with Dr. Dierdre Highman at bedside. Pt. Verbal consent. Timeout done.  Pt. Placed on zoll. HR prior to 150, HR after 94.

## 2012-12-15 NOTE — ED Provider Notes (Signed)
History     CSN: [id number]  Arrival date & time 12/15/12  0012   First MD Initiated Contact with Patient 12/15/12 0022      Chief Complaint  Patient presents with  . Chest Pain    (Consider location/radiation/quality/duration/timing/severity/associated sxs/prior treatment) HPI Hx per PT, at home drinking Cobra 40's and watching Westerns on TV and developed palpitations with associated CP, feels like his heart is pounding. Called EMS and received NTG and ASA in route with symptoms unchanged. He has h/o same, seen here yesterday for the same, recent stress test with Dr Algie Coffer. No leg pain or swelling, no F/C. No SOB. PT has been told to avoid alcohol in the past.     Past Medical History  Diagnosis Date  . Hypertension   . Coronary artery disease   . Hyperlipemia   . Angina   . Shortness of breath   . Mental disorder   . Arthritis   . COPD (chronic obstructive pulmonary disease)   . Hiatal hernia   . Depression   . Gout     Past Surgical History  Procedure Date  . Abdominal surgery     No family history on file.  History  Substance Use Topics  . Smoking status: Current Every Day Smoker -- 1.0 packs/day for 15 years    Types: Cigarettes  . Smokeless tobacco: Former Neurosurgeon  . Alcohol Use: 7.2 oz/week    12 Cans of beer per week     Comment: pt heavy drinker      Review of Systems  Constitutional: Negative for fever and chills.  HENT: Negative for neck pain and neck stiffness.   Eyes: Negative for pain.  Respiratory: Negative for shortness of breath.   Cardiovascular: Positive for chest pain and palpitations.  Gastrointestinal: Negative for abdominal pain.  Genitourinary: Negative for dysuria.  Musculoskeletal: Negative for back pain.  Skin: Negative for rash.  Neurological: Negative for headaches.  All other systems reviewed and are negative.    Allergies  Review of patient's allergies indicates no known allergies.  Home Medications   Current  Outpatient Rx  Name  Route  Sig  Dispense  Refill  . ALLOPURINOL 300 MG PO TABS   Oral   Take 1 tablet (300 mg total) by mouth 2 (two) times daily. For gout   60 tablet   0   . CLOPIDOGREL BISULFATE 75 MG PO TABS   Oral   Take 1 tablet (75 mg total) by mouth daily. For prevention of stroke and heart attack   30 tablet   0   . COLCHICINE 0.6 MG PO TABS   Oral   Take 1 tablet (0.6 mg total) by mouth daily. For gout   30 tablet   0   . GABAPENTIN 300 MG PO CAPS   Oral   Take 1 capsule (300 mg total) by mouth 3 (three) times daily. For anxiety and management of pain.  May be helpful for sleep.   90 capsule   0   . HYDROCHLOROTHIAZIDE 12.5 MG PO CAPS   Oral   Take 1 capsule (12.5 mg total) by mouth daily. For control of high blood pressure   30 capsule   0   . ISOSORBIDE MONONITRATE ER 30 MG PO TB24   Oral   Take 1 tablet (30 mg total) by mouth daily. To prevent chest pain.   30 tablet   0   . METOPROLOL TARTRATE 25 MG PO TABS   Oral  Take 1 tablet (25 mg total) by mouth 2 (two) times daily. For control of high blood pressure   60 tablet   0   . POTASSIUM CHLORIDE 10 MEQ PO TBCR   Oral   Take 2 tablets (20 mEq total) by mouth 2 (two) times daily. For potassium replacement because the hydrochlorothiazide make you lose potassium   60 tablet   0   . SIMVASTATIN 20 MG PO TABS   Oral   Take 1 tablet (20 mg total) by mouth at bedtime.   30 tablet   1   . THIAMINE HCL 100 MG PO TABS   Oral   Take 1 tablet (100 mg total) by mouth daily. For nutritional supplementation.   30 tablet   0     FOR nutritional recovery     BP 101/70  Pulse 148  Temp 98.5 F (36.9 C) (Oral)  Resp 25  SpO2 99%  Physical Exam  Constitutional: He is oriented to person, place, and time. He appears well-developed and well-nourished.  HENT:  Head: Normocephalic and atraumatic.  Mouth/Throat: Oropharynx is clear and moist.  Eyes: Conjunctivae normal and EOM are normal. Pupils are  equal, round, and reactive to light.  Neck: Trachea normal. Neck supple. No tracheal deviation present.  Cardiovascular: Regular rhythm, S1 normal, S2 normal and normal pulses.     No systolic murmur is present   No diastolic murmur is present  Pulses:      Radial pulses are 2+ on the right side, and 2+ on the left side.       tachycardia  Pulmonary/Chest: Effort normal and breath sounds normal. No stridor. He has no wheezes. He has no rhonchi. He has no rales. He exhibits no tenderness.  Abdominal: Soft. Normal appearance and bowel sounds are normal. There is no tenderness. There is no CVA tenderness and negative Murphy's sign.  Musculoskeletal: Normal range of motion. He exhibits no edema and no tenderness.       BLE:s Calves nontender, no cords or erythema, negative Homans sign  Neurological: He is alert and oriented to person, place, and time. He has normal strength. No cranial nerve deficit or sensory deficit. GCS eye subscore is 4. GCS verbal subscore is 5. GCS motor subscore is 6.  Skin: Skin is warm and dry. No rash noted. He is not diaphoretic.  Psychiatric: His speech is normal.       Cooperative and appropriate    ED Course  CARDIOVERSION Date/Time: 12/15/2012 12:45 AM Performed by: Sunnie Nielsen Authorized by: Sunnie Nielsen Consent: Verbal consent obtained. Risks and benefits: risks, benefits and alternatives were discussed Consent given by: patient Patient understanding: patient states understanding of the procedure being performed Patient consent: the patient's understanding of the procedure matches consent given Procedure consent: procedure consent matches procedure scheduled Required items: required blood products, implants, devices, and special equipment available Patient identity confirmed: verbally with patient Time out: Immediately prior to procedure a "time out" was called to verify the correct patient, procedure, equipment, support staff and site/side marked as  required. Cardioversion basis: emergent Pre-procedure rhythm: supraventricular tachycardia Patient position: patient was placed in a supine position Chest area: chest area exposed Electrodes: pads Electrodes placed: anterior-posterior Post-procedure rhythm: normal sinus rhythm Complications: no complications Comments: CHEMICAL CARDIOVERSION WITH ADENOSINE 12mg  IVP converted to NSR   (including critical care time)  Results for orders placed during the hospital encounter of 12/15/12  CBC      Component Value Range   WBC  8.9  4.0 - 10.5 K/uL   RBC 4.22  4.22 - 5.81 MIL/uL   Hemoglobin 13.9  13.0 - 17.0 g/dL   HCT 25.3  66.4 - 40.3 %   MCV 97.9  78.0 - 100.0 fL   MCH 32.9  26.0 - 34.0 pg   MCHC 33.7  30.0 - 36.0 g/dL   RDW 47.4 (*) 25.9 - 56.3 %   Platelets 251  150 - 400 K/uL  TROPONIN I      Component Value Range   Troponin I <0.30  <0.30 ng/mL  POCT I-STAT, CHEM 8      Component Value Range   Sodium 147 (*) 135 - 145 mEq/L   Potassium 3.6  3.5 - 5.1 mEq/L   Chloride 107  96 - 112 mEq/L   BUN 12  6 - 23 mg/dL   Creatinine, Ser 8.75 (*) 0.50 - 1.35 mg/dL   Glucose, Bld 82  70 - 99 mg/dL   Calcium, Ion 6.43  3.29 - 1.30 mmol/L   TCO2 27  0 - 100 mmol/L   Hemoglobin 15.0  13.0 - 17.0 g/dL   HCT 51.8  84.1 - 66.0 %   Dg Chest 2 View  12/13/2012  *RADIOLOGY REPORT*  Clinical Data: Chest pain and palpitations.  CHEST - 2 VIEW  Comparison: PA and lateral chest 12/07/2012. CT chest 11/19/2012.  Findings: The chest is hyperexpanded with flattening of the hemidiaphragms and enlargement of the retrosternal air space. Lungs are clear.  No pneumothorax or pleural fluid.  Remote left fifth rib fracture is noted.  IMPRESSION: Emphysema without acute disease.   Original Report Authenticated By: Holley Dexter, M.D.    Dg Chest 2 View  12/07/2012  *RADIOLOGY REPORT*  Clinical Data: Chest pain, shortness of breath, cough, chest congestion.  CHEST - 2 VIEW  Comparison: 11/24/2012.  Findings:  Normal sized heart.  Clear lungs.  The lungs are hyperexpanded with flattening of the hemidiaphragms.  Mild thoracic spine degenerative changes.  IMPRESSION: Mild changes of COPD.  No acute abnormality.   Original Report Authenticated By: Beckie Salts, M.D.    Ct Angio Chest W/cm &/or Wo Cm  11/19/2012  *RADIOLOGY REPORT*  Clinical Data: Chest pain and shortness of breath.  CT ANGIOGRAPHY CHEST  Technique:  Multidetector CT imaging of the chest using the standard protocol during bolus administration of intravenous contrast. Multiplanar reconstructed images including MIPs were obtained and reviewed to evaluate the vascular anatomy.  Contrast: 75mL OMNIPAQUE IOHEXOL 350 MG/ML SOLN  Comparison: None  Findings: The chest wall is unremarkable.  No supraclavicular or axillary mass or adenopathy.  Small scattered lymph nodes are noted.  The thyroid gland is grossly normal.  The bony thorax is intact.  No destructive bone lesions or spinal canal compromise. Remote healed rib fractures are noted bilaterally.  The heart is normal in size.  No pericardial effusion.  No mediastinal or hilar lymphadenopathy.  Scattered lymph nodes are noted.  The aorta is normal in caliber.  Advanced atherosclerotic changes are noted.  No focal aneurysm or dissection.  There are dense coronary artery calcifications.  The pulmonary arterial tree is well opacified.  No filling defects to suggest pulmonary emboli.  Examination of the lung parenchyma demonstrates moderate peribronchial thickening particularly in the lower lung zones. There is some associated areas of subsegmental atelectasis but no focal airspace consolidation to suggest pneumonia.  No worrisome pulmonary nodules or mass lesions.  No pulmonary edema or pleural effusion.  No findings for  interstitial lung disease or significant changes of emphysema.  The upper abdomen is unremarkable.  A left adrenal gland adenoma is noted.  IMPRESSION:  1.  No CT findings for pulmonary embolism.  2.  Moderate atherosclerotic changes involving the thoracic aorta but no focal aneurysm or dissection. 3.  Dense three-vessel coronary artery calcifications. 4.  Significant peribronchial thickening likely due to bronchitis. No focal infiltrates.   Original Report Authenticated By: Rudie Meyer, M.D.    Nm Myocar Multi W/spect W/wall Motion / Ef  11/20/2012  *RADIOLOGY REPORT*  Clinical Data:  72 year old male with current history of hypertension, COPD, SVT, and hyperlipidemia, presenting with chest pain and shortness of breath.  MYOCARDIAL IMAGING WITH SPECT (REST AND PHARMACOLOGIC-STRESS) GATED LEFT VENTRICULAR WALL MOTION STUDY LEFT VENTRICULAR EJECTION FRACTION  Technique:  Standard myocardial SPECT imaging was performed after resting intravenous injection of 10 mCi Tc-62m sestamibi. Subsequently, intravenous infusion of regadenoson was performed under the supervision of the Cardiology staff.  At peak effect of the drug, 30 mCi Tc-8m sestamibi was injected intravenously and standard myocardial SPECT  imaging was performed.  Quantitative gated imaging was also performed to evaluate left ventricular wall motion, and estimate left ventricular ejection fraction.  Comparison:  None.  Findings: Immediate post-regadenoson images demonstrate slight diminished uptake in the inferior wall relative to the remainder of the myocardium which can be explained on the basis of diaphragmatic attenuation.  Initial resting images demonstrate similar findings. No evidence of reversibility to suggest ischemia.  Findings were confirmed by the computer generated polar map.  Gated images demonstrate satisfactory thickening throughout the left ventricular myocardium with normal wall motion throughout.  Estimated Q G S ejection fraction measured 55%, with an end- diastolic volume of 77 ml and an end-systolic volume of 35 ml.  IMPRESSION:  1.  No evidence of myocardial ischemia or infarction. 2.  Normal left ventricular wall motion.  3.  Estimated Q G S ejection fraction 55%.   Original Report Authenticated By: Hulan Saas, M.D.    Dg Chest Portable 1 View  12/15/2012  *RADIOLOGY REPORT*  Clinical Data: chest pain.  PORTABLE CHEST - 1 VIEW  Comparison: 12/13/2012  Findings: There is hyperinflation of the lungs compatible with COPD.  Heart and mediastinal contours are within normal limits.  No focal opacities or effusions.  No acute bony abnormality.  IMPRESSION: COPD.  No acute findings.   Original Report Authenticated By: Charlett Nose, M.D.    CRITICAL CARE Performed by: Sunnie Nielsen   Total critical care time: 30  Critical care time was exclusive of separately billable procedures and treating other patients.  Critical care was necessary to treat or prevent imminent or life-threatening deterioration.  Critical care was time spent personally by me on the following activities: development of treatment plan with patient and/or surrogate as well as nursing, discussions with consultants, evaluation of patient's response to treatment, examination of patient, obtaining history from patient or surrogate, ordering and performing treatments and interventions, ordering and review of laboratory studies, ordering and review of radiographic studies, pulse oximetry and re-evaluation of patient's condition. Serial evaluations remains normal sinus rhythm post heart aversion to    Date: 12/15/2012  Rate: 149  Rhythm: narrow complex tachycardia  QRS Axis: normal  Intervals: QT prolonged  ST/T Wave abnormalities: nonspecific ST changes  Conduction Disutrbances:none  Narrative Interpretation:   Old EKG Reviewed: changes noted - previous ECG 12/13/12 NSR  POST PROCEDURE ECG BELOW  Date: 12/15/2012  Rate: 95  Rhythm: normal sinus rhythm  QRS Axis: normal  Intervals: normal  ST/T Wave abnormalities: nonspecific ST changes  Conduction Disutrbances:none  Narrative Interpretation: NSR with multiple PVCs  Old EKG Reviewed:  unchanged   Old records reviewed - DX with SVT by ED and CAR, on last ED visit broke tachycardia with adenosine.   2:38 AM remains NSR, d/w Dr Sharyn Lull, plan d/c home to f/u DR Algie Coffer on Monday in the clinic MDM  Symptomatic arrhythmia/ palpitations presenting in SVT with h/o same, converted to NSR with adenosine. H/o Alcohol Abuse. Followed by CAR. Symptoms resolved with cardioversion as above. CXR and labs reviewed. Cardiology consult.   Serial troponins negative      Sunnie Nielsen, MD 12/15/12 [phone]

## 2013-05-14 ENCOUNTER — Emergency Department (HOSPITAL_COMMUNITY): Payer: Medicare Other

## 2013-05-14 ENCOUNTER — Emergency Department (HOSPITAL_COMMUNITY)
Admission: EM | Admit: 2013-05-14 | Discharge: 2013-05-15 | Disposition: A | Discharge status: Other Acute Inpatient Hospital | Payer: Medicare Other | Attending: Emergency Medicine | Admitting: Emergency Medicine

## 2013-05-14 ENCOUNTER — Encounter (HOSPITAL_COMMUNITY): Payer: Self-pay | Admitting: *Deleted

## 2013-05-14 DIAGNOSIS — J4489 Other specified chronic obstructive pulmonary disease: Secondary | ICD-10-CM | POA: Insufficient documentation

## 2013-05-14 DIAGNOSIS — Z8639 Personal history of other endocrine, nutritional and metabolic disease: Secondary | ICD-10-CM | POA: Insufficient documentation

## 2013-05-14 DIAGNOSIS — Z8679 Personal history of other diseases of the circulatory system: Secondary | ICD-10-CM | POA: Insufficient documentation

## 2013-05-14 DIAGNOSIS — Z862 Personal history of diseases of the blood and blood-forming organs and certain disorders involving the immune mechanism: Secondary | ICD-10-CM | POA: Insufficient documentation

## 2013-05-14 DIAGNOSIS — F1020 Alcohol dependence, uncomplicated: Secondary | ICD-10-CM

## 2013-05-14 DIAGNOSIS — K7030 Alcoholic cirrhosis of liver without ascites: Secondary | ICD-10-CM

## 2013-05-14 DIAGNOSIS — F17200 Nicotine dependence, unspecified, uncomplicated: Secondary | ICD-10-CM | POA: Insufficient documentation

## 2013-05-14 DIAGNOSIS — IMO0001 Reserved for inherently not codable concepts without codable children: Secondary | ICD-10-CM | POA: Insufficient documentation

## 2013-05-14 DIAGNOSIS — Z8739 Personal history of other diseases of the musculoskeletal system and connective tissue: Secondary | ICD-10-CM | POA: Insufficient documentation

## 2013-05-14 DIAGNOSIS — I2510 Atherosclerotic heart disease of native coronary artery without angina pectoris: Secondary | ICD-10-CM | POA: Insufficient documentation

## 2013-05-14 DIAGNOSIS — I10 Essential (primary) hypertension: Secondary | ICD-10-CM | POA: Insufficient documentation

## 2013-05-14 DIAGNOSIS — F1010 Alcohol abuse, uncomplicated: Secondary | ICD-10-CM | POA: Insufficient documentation

## 2013-05-14 DIAGNOSIS — J449 Chronic obstructive pulmonary disease, unspecified: Secondary | ICD-10-CM | POA: Insufficient documentation

## 2013-05-14 DIAGNOSIS — Z8709 Personal history of other diseases of the respiratory system: Secondary | ICD-10-CM | POA: Insufficient documentation

## 2013-05-14 DIAGNOSIS — Z8719 Personal history of other diseases of the digestive system: Secondary | ICD-10-CM | POA: Insufficient documentation

## 2013-05-14 DIAGNOSIS — Z8659 Personal history of other mental and behavioral disorders: Secondary | ICD-10-CM | POA: Insufficient documentation

## 2013-05-14 HISTORY — DX: Anxiety disorder, unspecified: F41.9

## 2013-05-14 HISTORY — DX: Pain in unspecified joint: M25.50

## 2013-05-14 LAB — PROTIME-INR
INR: 0.95 (ref 0.00–1.49)
Prothrombin Time: 12.6 seconds (ref 11.6–15.2)

## 2013-05-14 LAB — COMPREHENSIVE METABOLIC PANEL
ALT: 49 U/L (ref 0–53)
AST: 92 U/L — ABNORMAL HIGH (ref 0–37)
CO2: 28 mEq/L (ref 19–32)
Chloride: 104 mEq/L (ref 96–112)
Creatinine, Ser: 0.89 mg/dL (ref 0.50–1.35)
GFR calc non Af Amer: 85 mL/min — ABNORMAL LOW (ref >90)
Total Bilirubin: 0.3 mg/dL (ref 0.3–1.2)

## 2013-05-14 LAB — CBC
MCV: 100.5 fL — ABNORMAL HIGH (ref 78.0–100.0)
Platelets: 213 10*3/uL (ref 150–400)
RBC: 4.43 MIL/uL (ref 4.22–5.81)
WBC: 8.8 10*3/uL (ref 4.0–10.5)

## 2013-05-14 LAB — SALICYLATE LEVEL: Salicylate Lvl: <2 mg/dL — ABNORMAL LOW (ref 2.8–20.0)

## 2013-05-14 LAB — RAPID URINE DRUG SCREEN, HOSP PERFORMED
Amphetamines: NOT DETECTED
Tetrahydrocannabinol: NOT DETECTED

## 2013-05-14 LAB — POCT I-STAT TROPONIN I

## 2013-05-14 LAB — APTT: aPTT: 33 seconds (ref 24–37)

## 2013-05-14 MED ORDER — ASPIRIN 81 MG PO CHEW
324.0000 mg | CHEWABLE_TABLET | Freq: Once | ORAL | Status: AC
  Administered 2013-05-14: 324 mg via ORAL
  Filled 2013-05-14: qty 4

## 2013-05-14 MED ORDER — THIAMINE HCL 100 MG/ML IJ SOLN
100.0000 mg | Freq: Once | INTRAMUSCULAR | Status: AC
  Administered 2013-05-14: 100 mg via INTRAVENOUS
  Filled 2013-05-14: qty 2

## 2013-05-14 MED ORDER — SODIUM CHLORIDE 0.9 % IV SOLN
1000.0000 mL | INTRAVENOUS | Status: DC
  Administered 2013-05-14: 1000 mL via INTRAVENOUS

## 2013-05-14 NOTE — ED Provider Notes (Signed)
History    CSN: [id number] Arrival date & time 05/14/13  1191 First MD Initiated Contact with Patient 05/14/13 1927      Chief Complaint  Patient presents with  . Medical Clearance    HPI Patient presents to the emergency room for 2 complaints. Patient states that mainly he wants to get detoxed. She is a chronic beer drinker. He drinks 2, 40 ounce beers daily. Patient has been drinking like this for years. He thinks he needs to stop. Patient also states she's had chest pain for last 3 days. It is constant. Nothing seems to make it better or worse. He denies any difficulty breathing. He denies any vomiting or diarrhea. Past Medical History  Diagnosis Date  . Hypertension   . Coronary artery disease   . Hyperlipemia   . Angina   . Shortness of breath   . Mental disorder   . Arthritis   . COPD (chronic obstructive pulmonary disease)   . Hiatal hernia   . Depression   . Gout   . Joint pain   . Depression   . Anxiety     Past Surgical History  Procedure Laterality Date  . Abdominal surgery      No family history on file.  History  Substance Use Topics  . Smoking status: Current Every Day Smoker -- 1.00 packs/day for 15 years    Types: Cigarettes  . Smokeless tobacco: Former Neurosurgeon  . Alcohol Use: 7.2 oz/week    12 Cans of beer per week     Comment: pt heavy drinker, pt stets at lest 2-3 40oz per day      Review of Systems  Constitutional: Negative for fever.  Eyes: Negative for photophobia.  Genitourinary: Negative for dysuria.  Musculoskeletal: Positive for myalgias.  Psychiatric/Behavioral: Negative for suicidal ideas.  All other systems reviewed and are negative.    Allergies  Review of patient's allergies indicates no known allergies.  Home Medications  No current outpatient prescriptions on file.  BP 110/80  Pulse 68  Temp(Src) 98.2 F (36.8 C) (Oral)  Resp 20  SpO2 95%  Physical Exam  Nursing note and vitals reviewed. Constitutional: He  appears well-developed and well-nourished. No distress.  HENT:  Head: Normocephalic and atraumatic.  Right Ear: External ear normal.  Left Ear: External ear normal.  Eyes: Conjunctivae are normal. Right eye exhibits no discharge. Left eye exhibits no discharge. No scleral icterus.  Neck: Neck supple. No tracheal deviation present.  Cardiovascular: Normal rate, regular rhythm and intact distal pulses.   Pulmonary/Chest: Effort normal and breath sounds normal. No stridor. No respiratory distress. He has no wheezes. He has no rales.  Abdominal: Soft. Bowel sounds are normal. He exhibits no distension. There is no tenderness. There is no rebound and no guarding.  Musculoskeletal: He exhibits no edema and no tenderness.  Neurological: He is alert. He has normal strength. No sensory deficit. Cranial nerve deficit:  no gross defecits noted. He exhibits normal muscle tone. He displays no seizure activity. Coordination normal.  Skin: Skin is warm and dry. No rash noted.  Psychiatric: He has a normal mood and affect.    ED Course  Procedures (including critical care time) EKG Normal sinus rhythm rate 71 Normal axis, normal control T wave flattening in lateral leads No significant change when compared to prior EKG  Labs Reviewed  CBC - Abnormal; Notable for the following:    MCV 100.5 (*)    MCH 34.5 (*)    All  other components within normal limits  COMPREHENSIVE METABOLIC PANEL - Abnormal; Notable for the following:    Albumin 3.3 (*)    AST 92 (*)    Alkaline Phosphatase 125 (*)    GFR calc non Af Amer 85 (*)    All other components within normal limits  ETHANOL - Abnormal; Notable for the following:    Alcohol, Ethyl (B) 330 (*)    All other components within normal limits  SALICYLATE LEVEL - Abnormal; Notable for the following:    Salicylate Lvl <2.0 (*)    All other components within normal limits  ACETAMINOPHEN LEVEL  URINE RAPID DRUG SCREEN (HOSP PERFORMED)  PROTIME-INR  APTT   POCT I-STAT TROPONIN I   Dg Chest 2 View  05/14/2013   *RADIOLOGY REPORT*  Clinical Data: Medical clearance and cough.  CHEST - 2 VIEW  Comparison: 12/15/2012  Findings: Stable evidence of underlying COPD.  No acute infiltrate, edema, pleural effusion or pulmonary nodule is detected.  Heart size is within normal limits.  Stable degenerative changes of the thoracic spine and an old and healed left fifth rib fracture are present.  IMPRESSION: COPD without acute findings.   Original Report Authenticated By: Irish Lack, M.D.     1. Alcohol abuse       MDM  12:22 AM Pt remains stable.  Still interested in Detox.  Will move pt to psych ed to see if he is a candidate for detox treatment.        Celene Kras, MD 05/15/13 [phone]

## 2013-05-14 NOTE — ED Notes (Signed)
UJW:[id number]<YN> Expected date:05/14/13<BR> Expected time: 7:12 PM<BR> Means of arrival:Ambulance<BR> Comments:<BR> 72 yr detox

## 2013-05-14 NOTE — ED Notes (Signed)
Pt to ER via EMS; EMS was called out for a complaint of chest pain; EMS began an IV, gave 324mg  of ASA and did a 12 lead EKG; pt then began to cry and state that he didn't have chest pain and that he was depressed and wants help to Detox from ETOH; pt c/o all over joint pain; pt denies chest pain upon arrival to ER; pt tearful and states "I just need help"

## 2013-05-15 ENCOUNTER — Encounter (HOSPITAL_COMMUNITY): Payer: Self-pay | Admitting: Emergency Medicine

## 2013-05-15 ENCOUNTER — Inpatient Hospital Stay (HOSPITAL_COMMUNITY)
Admission: AD | Admit: 2013-05-15 | Discharge: 2013-05-26 | DRG: 897 | Disposition: A | Discharge status: Home / Self Care | Payer: Medicare Other | Source: Intra-hospital | Attending: Psychiatry | Admitting: Psychiatry

## 2013-05-15 DIAGNOSIS — I2510 Atherosclerotic heart disease of native coronary artery without angina pectoris: Secondary | ICD-10-CM | POA: Diagnosis present

## 2013-05-15 DIAGNOSIS — Z79899 Other long term (current) drug therapy: Secondary | ICD-10-CM

## 2013-05-15 DIAGNOSIS — M129 Arthropathy, unspecified: Secondary | ICD-10-CM

## 2013-05-15 DIAGNOSIS — J449 Chronic obstructive pulmonary disease, unspecified: Secondary | ICD-10-CM | POA: Diagnosis present

## 2013-05-15 DIAGNOSIS — K7030 Alcoholic cirrhosis of liver without ascites: Secondary | ICD-10-CM

## 2013-05-15 DIAGNOSIS — I10 Essential (primary) hypertension: Secondary | ICD-10-CM | POA: Diagnosis present

## 2013-05-15 DIAGNOSIS — E785 Hyperlipidemia, unspecified: Secondary | ICD-10-CM | POA: Diagnosis present

## 2013-05-15 DIAGNOSIS — M109 Gout, unspecified: Secondary | ICD-10-CM | POA: Diagnosis present

## 2013-05-15 DIAGNOSIS — F1910 Other psychoactive substance abuse, uncomplicated: Secondary | ICD-10-CM

## 2013-05-15 DIAGNOSIS — F1020 Alcohol dependence, uncomplicated: Secondary | ICD-10-CM | POA: Diagnosis present

## 2013-05-15 DIAGNOSIS — F1010 Alcohol abuse, uncomplicated: Secondary | ICD-10-CM

## 2013-05-15 DIAGNOSIS — J4489 Other specified chronic obstructive pulmonary disease: Secondary | ICD-10-CM | POA: Diagnosis present

## 2013-05-15 LAB — HEPATIC FUNCTION PANEL
Albumin: 2.7 g/dL — ABNORMAL LOW (ref 3.5–5.2)
Indirect Bilirubin: 0.4 mg/dL (ref 0.3–0.9)
Total Protein: 7 g/dL (ref 6.0–8.3)

## 2013-05-15 MED ORDER — SIMVASTATIN 20 MG PO TABS
20.0000 mg | ORAL_TABLET | Freq: Every day | ORAL | Status: DC
  Administered 2013-05-15: 20 mg via ORAL
  Filled 2013-05-15: qty 1

## 2013-05-15 MED ORDER — ACETAMINOPHEN 325 MG PO TABS: 650.0000 mg | ORAL_TABLET | ORAL | Status: DC | PRN

## 2013-05-15 MED ORDER — LORAZEPAM 1 MG PO TABS: 0.0000 mg | ORAL_TABLET | Freq: Two times a day (BID) | ORAL | Status: DC

## 2013-05-15 MED ORDER — LORAZEPAM 1 MG PO TABS: 1.0000 mg | ORAL_TABLET | Freq: Four times a day (QID) | ORAL | Status: DC | PRN

## 2013-05-15 MED ORDER — CLOPIDOGREL BISULFATE 75 MG PO TABS
75.0000 mg | ORAL_TABLET | Freq: Every day | ORAL | Status: DC
  Filled 2013-05-15: qty 1

## 2013-05-15 MED ORDER — GABAPENTIN 300 MG PO CAPS
300.0000 mg | ORAL_CAPSULE | Freq: Three times a day (TID) | ORAL | Status: DC
  Administered 2013-05-15 (×2): 300 mg via ORAL
  Filled 2013-05-15 (×2): qty 1

## 2013-05-15 MED ORDER — LORAZEPAM 1 MG PO TABS
0.0000 mg | ORAL_TABLET | Freq: Four times a day (QID) | ORAL | Status: DC
  Administered 2013-05-15: 1 mg via ORAL
  Filled 2013-05-15: qty 1

## 2013-05-15 MED ORDER — THIAMINE HCL 100 MG/ML IJ SOLN: 100.0000 mg | Freq: Every day | INTRAMUSCULAR | Status: DC

## 2013-05-15 MED ORDER — VITAMIN B-1 100 MG PO TABS
100.0000 mg | ORAL_TABLET | Freq: Every day | ORAL | Status: DC
  Administered 2013-05-15: 100 mg via ORAL

## 2013-05-15 MED ORDER — GABAPENTIN 600 MG PO TABS
300.0000 mg | ORAL_TABLET | Freq: Three times a day (TID) | ORAL | Status: DC
  Filled 2013-05-15 (×2): qty 0.5

## 2013-05-15 MED ORDER — VITAMIN B-1 100 MG PO TABS: 100.0000 mg | ORAL_TABLET | Freq: Every day | ORAL | Status: DC

## 2013-05-15 MED ORDER — ADULT MULTIVITAMIN W/MINERALS CH: 1.0000 | ORAL_TABLET | Freq: Every day | ORAL | Status: DC

## 2013-05-15 MED ORDER — ALUM & MAG HYDROXIDE-SIMETH 200-200-20 MG/5ML PO SUSP
30.0000 mL | ORAL | Status: DC | PRN
  Administered 2013-05-15: 30 mL via ORAL
  Filled 2013-05-15: qty 30

## 2013-05-15 MED ORDER — CHLORDIAZEPOXIDE HCL 25 MG PO CAPS: 25.0000 mg | ORAL_CAPSULE | Freq: Every day | ORAL | Status: DC

## 2013-05-15 MED ORDER — CHLORDIAZEPOXIDE HCL 25 MG PO CAPS: 25.0000 mg | ORAL_CAPSULE | Freq: Three times a day (TID) | ORAL | Status: DC

## 2013-05-15 MED ORDER — CHLORDIAZEPOXIDE HCL 25 MG PO CAPS
25.0000 mg | ORAL_CAPSULE | Freq: Four times a day (QID) | ORAL | Status: DC
  Administered 2013-05-15 (×3): 25 mg via ORAL
  Filled 2013-05-15 (×3): qty 1

## 2013-05-15 MED ORDER — LOPERAMIDE HCL 2 MG PO CAPS: 2.0000 mg | ORAL_CAPSULE | ORAL | Status: DC | PRN

## 2013-05-15 MED ORDER — HYDROXYZINE HCL 25 MG PO TABS: 25.0000 mg | ORAL_TABLET | Freq: Four times a day (QID) | ORAL | Status: DC | PRN

## 2013-05-15 MED ORDER — CHLORDIAZEPOXIDE HCL 25 MG PO CAPS: 25.0000 mg | ORAL_CAPSULE | Freq: Once | ORAL | Status: DC

## 2013-05-15 MED ORDER — HYDROCHLOROTHIAZIDE 25 MG PO TABS
12.5000 mg | ORAL_TABLET | Freq: Every day | ORAL | Status: DC
  Filled 2013-05-15: qty 0.5

## 2013-05-15 MED ORDER — CHLORDIAZEPOXIDE HCL 25 MG PO CAPS: 25.0000 mg | ORAL_CAPSULE | ORAL | Status: DC

## 2013-05-15 MED ORDER — ADULT MULTIVITAMIN W/MINERALS CH
1.0000 | ORAL_TABLET | Freq: Every day | ORAL | Status: DC
  Administered 2013-05-15: 1 via ORAL
  Filled 2013-05-15: qty 1

## 2013-05-15 MED ORDER — ONDANSETRON HCL 4 MG PO TABS: 4.0000 mg | ORAL_TABLET | Freq: Three times a day (TID) | ORAL | Status: DC | PRN

## 2013-05-15 MED ORDER — HYDROCHLOROTHIAZIDE 12.5 MG PO CAPS
12.5000 mg | ORAL_CAPSULE | Freq: Every day | ORAL | Status: DC
  Administered 2013-05-15: 12.5 mg via ORAL
  Filled 2013-05-15: qty 1

## 2013-05-15 MED ORDER — ALLOPURINOL 300 MG PO TABS
300.0000 mg | ORAL_TABLET | Freq: Two times a day (BID) | ORAL | Status: DC
  Administered 2013-05-15 (×2): 300 mg via ORAL
  Filled 2013-05-15 (×2): qty 1

## 2013-05-15 MED ORDER — POTASSIUM CHLORIDE CRYS ER 20 MEQ PO TBCR
10.0000 meq | EXTENDED_RELEASE_TABLET | Freq: Two times a day (BID) | ORAL | Status: DC
  Administered 2013-05-15 (×2): 10 meq via ORAL
  Filled 2013-05-15 (×2): qty 1

## 2013-05-15 MED ORDER — FOLIC ACID 1 MG PO TABS
1.0000 mg | ORAL_TABLET | Freq: Every day | ORAL | Status: DC
  Administered 2013-05-15: 1 mg via ORAL
  Filled 2013-05-15: qty 1

## 2013-05-15 MED ORDER — ONDANSETRON 4 MG PO TBDP: 4.0000 mg | ORAL_TABLET | Freq: Four times a day (QID) | ORAL | Status: DC | PRN

## 2013-05-15 MED ORDER — CHLORDIAZEPOXIDE HCL 25 MG PO CAPS: 25.0000 mg | ORAL_CAPSULE | Freq: Four times a day (QID) | ORAL | Status: DC | PRN

## 2013-05-15 MED ORDER — VITAMIN B-1 100 MG PO TABS
100.0000 mg | ORAL_TABLET | Freq: Every day | ORAL | Status: DC
  Administered 2013-05-15: 100 mg via ORAL
  Filled 2013-05-15 (×2): qty 1

## 2013-05-15 MED ORDER — METOPROLOL TARTRATE 25 MG PO TABS
25.0000 mg | ORAL_TABLET | Freq: Two times a day (BID) | ORAL | Status: DC
  Administered 2013-05-15 (×2): 25 mg via ORAL
  Filled 2013-05-15 (×2): qty 1

## 2013-05-15 MED ORDER — LORAZEPAM 2 MG/ML IJ SOLN: 1.0000 mg | Freq: Four times a day (QID) | INTRAMUSCULAR | Status: DC | PRN

## 2013-05-15 MED ORDER — THIAMINE HCL 100 MG/ML IJ SOLN: 100.0000 mg | Freq: Once | INTRAMUSCULAR | Status: AC

## 2013-05-15 MED ORDER — ISOSORBIDE MONONITRATE ER 30 MG PO TB24
30.0000 mg | ORAL_TABLET | Freq: Every day | ORAL | Status: DC
  Administered 2013-05-15: 30 mg via ORAL
  Filled 2013-05-15: qty 1

## 2013-05-15 NOTE — BHH Counselor (Signed)
Patient accepted to [facility] by Donell Sievert, PA to Geoffery Lyons, MD . The room assignment is 304-1

## 2013-05-15 NOTE — Tx Team (Signed)
Initial Interdisciplinary Treatment Plan  PATIENT STRENGTHS: (choose at least two) Ability for insight Active sense of humor Average or above average intelligence Communication skills Supportive family/friends  PATIENT STRESSORS: Substance abuse   PROBLEM LIST: Problem List/Patient Goals Date to be addressed Date deferred Reason deferred Estimated date of resolution  Substance Abuse 05/15/13                                                      DISCHARGE CRITERIA:  Motivation to continue treatment in a less acute level of care Withdrawal symptoms are absent or subacute and managed without 24-hour nursing intervention  PRELIMINARY DISCHARGE PLAN: Outpatient therapy Return to previous living arrangement  PATIENT/FAMIILY INVOLVEMENT: This treatment plan has been presented to and reviewed with the patient, Zachary [name], and/or family member, .  The patient and family have been given the opportunity to ask questions and make suggestions.  Renaee Munda 05/15/2013, 10:57 PM

## 2013-05-15 NOTE — Progress Notes (Signed)
Patient ID: Zachary [name], male   DOB: 1941-05-29, 72 y.o.   MRN: [id number]  Admission Note: Patient admitted voluntarily for alcohol detox. This is one of many admissions here at [facility] for the same. Pt denies SI/HI or A/V hallucinations. Pt states he started drinking at age 3 and now drinks 2- 40 oz. Beers per day. Pt states he gets tremors when he stops drinking. Report given to [place], [place]

## 2013-05-15 NOTE — ED Notes (Signed)
Pt did not come to hospital with shoes.  Transported to [facility] with out shoes, socks on feet.

## 2013-05-15 NOTE — BH Assessment (Signed)
Assessment Note   Zachary [name] is an 72 y.o. male presenting to [facility] requesting alcohol detox. Sts he started drinking at age 25. He reports drinking (2) 40 oz beers daily.  Patient's last drank was yesterday 05/14/2013 (2) 40oz beers. Patient denies drug use. He reports tremors as his only withdrawal symptom. He denies seizures and black outs. Patient denies SI, HI, and AVH's. He reports mild depression with associated symptoms of hopelessness and loss of interest in usual pleasures. Patient does not identify any stressors at this time. Appears to have limited support stating he resides with friends. He denies previous psych treatment, however; has received inpatient treatment at [facility] for detox in the past.   Patient accepted to [facility] after evaluation by Dr. Lolly Mustache and Alvy Beal, NP.  Axis I: Alcohol Dependence; Depressive Disorder NOS Axis II: Deferred Axis III:  Past Medical History  Diagnosis Date  . Hypertension   . Coronary artery disease   . Hyperlipemia   . Angina   . Shortness of breath   . Mental disorder   . Arthritis   . COPD (chronic obstructive pulmonary disease)   . Hiatal hernia   . Depression   . Gout   . Joint pain   . Depression   . Anxiety    Axis IV: other psychosocial or environmental problems, problems related to social environment, problems with access to health care services and problems with primary support group Axis V: 31-40 impairment in reality testing  Past Medical History:  Past Medical History  Diagnosis Date  . Hypertension   . Coronary artery disease   . Hyperlipemia   . Angina   . Shortness of breath   . Mental disorder   . Arthritis   . COPD (chronic obstructive pulmonary disease)   . Hiatal hernia   . Depression   . Gout   . Joint pain   . Depression   . Anxiety     Past Surgical History  Procedure Laterality Date  . Abdominal surgery      Family History: No family history on file.  Social History:  reports that he has  been smoking Cigarettes.  He has a 15 pack-year smoking history. He has quit using smokeless tobacco. He reports that he drinks about 7.2 ounces of alcohol per week. He reports that he does not use illicit drugs.  Additional Social History:  Alcohol / Drug Use Pain Medications: SEE MAR Prescriptions: SEE MAR Over the Counter: SEE MAR History of alcohol / drug use?: Yes Substance #1 Name of Substance 1: Alcohol  1 - Age of First Use: 72 yrs old  1 - Amount (size/oz): (2) 40oz 1 - Frequency: daily  1 - Duration: past yr 1 - Last Use / Amount: 05/14/2013; 2 beers  CIWA: CIWA-Ar BP: 155/80 mmHg Pulse Rate: 66 Nausea and Vomiting: no nausea and no vomiting Tactile Disturbances: none Tremor: not visible, but can be felt fingertip to fingertip Auditory Disturbances: not present Paroxysmal Sweats: barely perceptible sweating, palms moist Visual Disturbances: not present Anxiety: no anxiety, at ease Headache, Fullness in Head: none present Agitation: normal activity Orientation and Clouding of Sensorium: oriented and can do serial additions CIWA-Ar Total: 2 COWS:    Allergies: No Known Allergies  Home Medications:  (Not in a hospital admission)  OB/GYN Status:  No LMP for male patient.  General Assessment Data Location of Assessment: [facility] Living Arrangements: Other (Comment);Non-relatives/Friends (lives with friends) Can pt return to current living arrangement?: Yes Admission  Status: Voluntary Is patient capable of signing voluntary admission?: Yes Transfer from: Acute Hospital Referral Source: Self/Family/Friend     Risk to self Suicidal Ideation: No Suicidal Intent: No Is patient at risk for suicide?: No Suicidal Plan?: No Access to Means: No What has been your use of drugs/alcohol within the last 12 months?:  (n/a) Previous Attempts/Gestures: No How many times?:  (0) Other Self Harm Risks:  (n/a) Triggers for Past Attempts:  (no previous  attempts/gestures) Intentional Self Injurious Behavior: None Family Suicide History: No Recent stressful life event(s): Other (Comment) (patient did not identify any stressors) Persecutory voices/beliefs?: No Depression: Yes Depression Symptoms: Feeling worthless/self pity;Loss of interest in usual pleasures Substance abuse history and/or treatment for substance abuse?: No Suicide prevention information given to non-admitted patients: Not applicable  Risk to Others Homicidal Ideation: No Thoughts of Harm to Others: No Current Homicidal Intent: No Current Homicidal Plan: No Access to Homicidal Means: No Identified Victim:  (n/a) History of harm to others?: No Assessment of Violence: None Noted Violent Behavior Description:  (patient is calm and cooperative) Does patient have access to weapons?: No Criminal Charges Pending?: No Does patient have a court date: No  Psychosis Hallucinations: None noted Delusions: None noted  Mental Status Report Appear/Hygiene: Disheveled Eye Contact: Good Motor Activity: Freedom of movement Speech: Logical/coherent Level of Consciousness: Alert Mood: Depressed Affect: Appropriate to circumstance Anxiety Level: None Thought Processes: Coherent;Relevant Judgement: Unimpaired Orientation: Person;Place;Time;Situation Obsessive Compulsive Thoughts/Behaviors: None  Cognitive Functioning Concentration: Normal Memory: Recent Intact;Remote Intact IQ: Average Insight: Fair Impulse Control: Fair Appetite: Poor Weight Loss:  (none reported) Weight Gain:  (none reported) Sleep: Decreased Total Hours of Sleep:  (none reported) Vegetative Symptoms: None  ADLScreening [facility] Assessment Services) Patient's cognitive ability adequate to safely complete daily activities?: Yes Patient able to express need for assistance with ADLs?: Yes Independently performs ADLs?: Yes (appropriate for developmental age)  Abuse/Neglect [facility]) Physical Abuse:  Denies Verbal Abuse: Denies Sexual Abuse: Denies  Prior Inpatient Therapy Prior Inpatient Therapy: Yes Prior Therapy Dates:  (pt unable to recall dates of admission) Prior Therapy Facilty/Provider(s):  [facility]) Reason for Treatment:  (detox-alcohol)  Prior Outpatient Therapy Prior Outpatient Therapy: No Prior Therapy Dates:  (n/a) Prior Therapy Facilty/Provider(s):  (n/a) Reason for Treatment:  (n/a)  ADL Screening (condition at time of admission) Patient's cognitive ability adequate to safely complete daily activities?: Yes Patient able to express need for assistance with ADLs?: Yes Independently performs ADLs?: Yes (appropriate for developmental age) Weakness of Legs: None Weakness of Arms/Hands: None  Home Assistive Devices/Equipment Home Assistive Devices/Equipment: None    Abuse/Neglect Assessment (Assessment to be complete while patient is alone) Physical Abuse: Denies Verbal Abuse: Denies Sexual Abuse: Denies Exploitation of patient/patient's resources: Denies Self-Neglect: Denies Values / Beliefs Cultural Requests During Hospitalization: None Spiritual Requests During Hospitalization: None   Advance Directives (For Healthcare) Advance Directive: Patient does not have advance directive Nutrition Screen- [facility] Adult/[facility] Patient's home diet: Regular  Additional Information 1:1 In Past 12 Months?: No CIRT Risk: No Elopement Risk: No Does patient have medical clearance?: Yes     Disposition:  Disposition Initial Assessment Completed for this Encounter: Yes Disposition of Patient: Inpatient treatment program Type of inpatient treatment program: Adult  On Site Evaluation by:   Reviewed with Physician:     Melynda Ripple [facility] 05/15/2013 2:54 PM

## 2013-05-15 NOTE — ED Notes (Signed)
Pt transferred from Main ED, presents for detox from alcohol.   Denies drug use.  Denies SI or HI.  Pt calm & cooperative.

## 2013-05-15 NOTE — Consult Note (Signed)
Reason for Consult:Eval for IP alcohol detox Referring Physician: [facility] EDP  Zachary [name] is an 72 y.o. male.  HPI: Pt is a 72 y/o male presenting to [facility] ED with desires to proceed with alcohol detox. Pt endorses consuming x 2 40 oz beers daily. Pt endorses a drinking hx that started at age 73, drinking grain alcohol. Pt endorses hx of DTs when proceeding with prior opportunities to stop drinking. Pt's last drink was just prior to his ED admission, when he started but did not finish drinking a cold 40 oz brew. Pt denies any concerns with depression, panic attacks, PTSD and or anxiety sx. Pt also denies SI/SA/HI or AVH. Pt endorses that he can contract for safety at this time. Pt denies any ongoing polysubstance abuse as well. Pt hasn't  been involved in AA or NA programs at present.  Past Medical History  Diagnosis Date  . Hypertension   . Coronary artery disease   . Hyperlipemia   . Angina   . Shortness of breath   . Mental disorder   . Arthritis   . COPD (chronic obstructive pulmonary disease)   . Hiatal hernia   . Depression   . Gout   . Joint pain   . Depression   . Anxiety     Past Surgical History  Procedure Laterality Date  . Abdominal surgery      No family history on file.  Social History:  reports that he has been smoking Cigarettes.  He has a 15 pack-year smoking history. He has quit using smokeless tobacco. He reports that he drinks about 7.2 ounces of alcohol per week. He reports that he does not use illicit drugs.  Allergies: No Known Allergies  Medications: I have reviewed the patient's current medications.  Results for orders placed during the hospital encounter of 05/14/13 (from the past 48 hour(s))  ACETAMINOPHEN LEVEL     Status: None   Collection Time    05/14/13  7:50 PM      Result Value Range   Acetaminophen (Tylenol), Serum <15.0  10 - 30 ug/mL   Comment:            THERAPEUTIC CONCENTRATIONS VARY     SIGNIFICANTLY. A RANGE OF 10-30     ug/mL MAY BE  AN EFFECTIVE     CONCENTRATION FOR MANY PATIENTS.     HOWEVER, SOME ARE BEST TREATED     AT CONCENTRATIONS OUTSIDE THIS     RANGE.     ACETAMINOPHEN CONCENTRATIONS     >150 ug/mL AT 4 HOURS AFTER     INGESTION AND >50 ug/mL AT 12     HOURS AFTER INGESTION ARE     OFTEN ASSOCIATED WITH TOXIC     REACTIONS.  CBC     Status: Abnormal   Collection Time    05/14/13  7:50 PM      Result Value Range   WBC 8.8  4.0 - 10.5 K/uL   RBC 4.43  4.22 - 5.81 MIL/uL   Hemoglobin 15.3  13.0 - 17.0 g/dL   HCT 16.1  09.6 - 04.5 %   MCV 100.5 (*) 78.0 - 100.0 fL   MCH 34.5 (*) 26.0 - 34.0 pg   MCHC 34.4  30.0 - 36.0 g/dL   RDW 40.9  81.1 - 91.4 %   Platelets 213  150 - 400 K/uL  COMPREHENSIVE METABOLIC PANEL     Status: Abnormal   Collection Time    05/14/13  7:50  PM      Result Value Range   Sodium 142  135 - 145 mEq/L   Potassium 4.1  3.5 - 5.1 mEq/L   Chloride 104  96 - 112 mEq/L   CO2 28  19 - 32 mEq/L   Glucose, Bld 91  70 - 99 mg/dL   BUN 6  6 - 23 mg/dL   Creatinine, Ser 4.09  0.50 - 1.35 mg/dL   Calcium 9.1  8.4 - 81.1 mg/dL   Total Protein 8.0  6.0 - 8.3 g/dL   Albumin 3.3 (*) 3.5 - 5.2 g/dL   AST 92 (*) 0 - 37 U/L   ALT 49  0 - 53 U/L   Alkaline Phosphatase 125 (*) 39 - 117 U/L   Total Bilirubin 0.3  0.3 - 1.2 mg/dL   GFR calc non Af Amer 85 (*) >90 mL/min   GFR calc Af Amer >90  >90 mL/min   Comment:            The eGFR has been calculated     using the CKD EPI equation.     This calculation has not been     validated in all clinical     situations.     eGFR's persistently     <90 mL/min signify     possible Chronic Kidney Disease.  ETHANOL     Status: Abnormal   Collection Time    05/14/13  7:50 PM      Result Value Range   Alcohol, Ethyl (B) 330 (*) 0 - 11 mg/dL   Comment:            LOWEST DETECTABLE LIMIT FOR     SERUM ALCOHOL IS 11 mg/dL     FOR MEDICAL PURPOSES ONLY  SALICYLATE LEVEL     Status: Abnormal   Collection Time    05/14/13  7:50 PM      Result  Value Range   Salicylate Lvl <2.0 (*) 2.8 - 20.0 mg/dL  URINE RAPID DRUG SCREEN (HOSP PERFORMED)     Status: None   Collection Time    05/14/13  7:50 PM      Result Value Range   Opiates NONE DETECTED  NONE DETECTED   Cocaine NONE DETECTED  NONE DETECTED   Benzodiazepines NONE DETECTED  NONE DETECTED   Amphetamines NONE DETECTED  NONE DETECTED   Tetrahydrocannabinol NONE DETECTED  NONE DETECTED   Barbiturates NONE DETECTED  NONE DETECTED   Comment:            DRUG SCREEN FOR MEDICAL PURPOSES     ONLY.  IF CONFIRMATION IS NEEDED     FOR ANY PURPOSE, NOTIFY LAB     WITHIN 5 DAYS.                LOWEST DETECTABLE LIMITS     FOR URINE DRUG SCREEN     Drug Class       Cutoff (ng/mL)     Amphetamine      1000     Barbiturate      200     Benzodiazepine   200     Tricyclics       300     Opiates          300     Cocaine          300     THC              50  PROTIME-INR     Status: None   Collection Time    05/14/13  8:30 PM      Result Value Range   Prothrombin Time 12.6  11.6 - 15.2 seconds   INR 0.95  0.00 - 1.49  APTT     Status: None   Collection Time    05/14/13  8:30 PM      Result Value Range   aPTT 33  24 - 37 seconds  POCT I-STAT TROPONIN I     Status: None   Collection Time    05/14/13  8:37 PM      Result Value Range   Troponin i, poc 0.01  0.00 - 0.08 ng/mL   Comment 3            Comment: Due to the release kinetics of cTnI,     a negative result within the first hours     of the onset of symptoms does not rule out     myocardial infarction with certainty.     If myocardial infarction is still suspected,     repeat the test at appropriate intervals.    Dg Chest 2 View  05/14/2013   *RADIOLOGY REPORT*  Clinical Data: Medical clearance and cough.  CHEST - 2 VIEW  Comparison: 12/15/2012  Findings: Stable evidence of underlying COPD.  No acute infiltrate, edema, pleural effusion or pulmonary nodule is detected.  Heart size is within normal limits.  Stable  degenerative changes of the thoracic spine and an old and healed left fifth rib fracture are present.  IMPRESSION: COPD without acute findings.   Original Report Authenticated By: Irish Lack, M.D.    Review of Systems  Constitutional: Positive for diaphoresis. Negative for weight loss.  HENT: Negative.   Eyes: Negative.   Respiratory: Negative.   Cardiovascular: Positive for chest pain and palpitations.  Gastrointestinal: Negative for heartburn, nausea, vomiting and abdominal pain.  Genitourinary: Negative.   Musculoskeletal: Negative.   Skin: Negative for rash.  Neurological: Positive for dizziness, seizures and weakness. Negative for tingling, tremors, sensory change, focal weakness and loss of consciousness.  Endo/Heme/Allergies: Negative.   Psychiatric/Behavioral: Positive for substance abuse. Negative for depression, suicidal ideas, hallucinations and memory loss. The patient is nervous/anxious.    Blood pressure 108/95, pulse 65, temperature 97.9 F (36.6 C), temperature source Oral, resp. rate 18, SpO2 96.00%. Physical Exam  Vitals reviewed. Constitutional: He is oriented to person, place, and time.  Malnourished cachetic appearing male   HENT:  Head: Normocephalic.  Eyes: Pupils are equal, round, and reactive to light.  Neck: Neck supple. No thyromegaly present.  Cardiovascular: Normal rate.   Respiratory: Breath sounds normal.  GI: Soft. Bowel sounds are normal.  Genitourinary:  Exam deferred  Neurological: He is alert and oriented to person, place, and time. He has normal reflexes. No cranial nerve deficit.  Skin: Skin is warm and dry.  Psychiatric:  Communicative, with good eye contact. Denies SI/SA/HI or AVH at present. endorses h/o of DT's with alcohol cessation in the past.    Assessment/Plan: 1) Will proceed with admission to [facility] Alcohol Detox program for crises mgmt, safety and stabilization 2) Social work to facilitate OP services and programs to help  sustain alcohol cessation to include AA / NA opportunities 3) Continued mgmt of Co- morbid conditions related to chronic alcohol/polysubstamce abuse and related secondary diagnosis.  SIMON,SPENCER E 05/15/2013, 3:17 AM     Follow up consult:  Face to face and consult with Dr.  Arfeen Recommendations:  Agree with previous assessment/plan by Donell Sievert PA-C in addition to starting Librium protocol for detox and repeat of Liver Function Test (LFT), and start previous home medications.  Shuvon B. Rankin FNP-BC Family Nurse Practitioner, Board Certified  Patient accepted [facility] room 304/01

## 2013-05-16 ENCOUNTER — Encounter (HOSPITAL_COMMUNITY): Payer: Self-pay | Admitting: Psychiatry

## 2013-05-16 MED ORDER — GABAPENTIN 300 MG PO CAPS
300.0000 mg | ORAL_CAPSULE | Freq: Three times a day (TID) | ORAL | Status: DC
  Administered 2013-05-16 – 2013-05-19 (×9): 300 mg via ORAL
  Filled 2013-05-16 (×14): qty 1

## 2013-05-16 MED ORDER — ONDANSETRON 4 MG PO TBDP: 4.0000 mg | ORAL_TABLET | Freq: Four times a day (QID) | ORAL | Status: AC | PRN

## 2013-05-16 MED ORDER — CHLORDIAZEPOXIDE HCL 25 MG PO CAPS
25.0000 mg | ORAL_CAPSULE | Freq: Every day | ORAL | Status: AC
  Administered 2013-05-17 – 2013-05-20 (×2): 25 mg via ORAL

## 2013-05-16 MED ORDER — CHLORDIAZEPOXIDE HCL 25 MG PO CAPS
25.0000 mg | ORAL_CAPSULE | Freq: Three times a day (TID) | ORAL | Status: AC
  Administered 2013-05-18 (×2): 25 mg via ORAL
  Filled 2013-05-16 (×4): qty 1

## 2013-05-16 MED ORDER — LOPERAMIDE HCL 2 MG PO CAPS: 2.0000 mg | ORAL_CAPSULE | ORAL | Status: AC | PRN

## 2013-05-16 MED ORDER — CHLORDIAZEPOXIDE HCL 25 MG PO CAPS
25.0000 mg | ORAL_CAPSULE | Freq: Four times a day (QID) | ORAL | Status: AC | PRN
  Administered 2013-05-17 – 2013-05-18 (×3): 25 mg via ORAL
  Filled 2013-05-16 (×3): qty 1

## 2013-05-16 MED ORDER — ADULT MULTIVITAMIN W/MINERALS CH
1.0000 | ORAL_TABLET | Freq: Every day | ORAL | Status: DC
  Administered 2013-05-16 – 2013-05-26 (×11): 1 via ORAL
  Filled 2013-05-16 (×13): qty 1

## 2013-05-16 MED ORDER — CHLORDIAZEPOXIDE HCL 25 MG PO CAPS
25.0000 mg | ORAL_CAPSULE | ORAL | Status: AC
  Administered 2013-05-18 – 2013-05-19 (×2): 25 mg via ORAL
  Filled 2013-05-16 (×2): qty 1

## 2013-05-16 MED ORDER — MAGNESIUM HYDROXIDE 400 MG/5ML PO SUSP: 30.0000 mL | Freq: Every day | ORAL | Status: DC | PRN

## 2013-05-16 MED ORDER — ACETAMINOPHEN 325 MG PO TABS
650.0000 mg | ORAL_TABLET | Freq: Four times a day (QID) | ORAL | Status: DC | PRN
  Administered 2013-05-16 – 2013-05-26 (×9): 650 mg via ORAL

## 2013-05-16 MED ORDER — CHLORDIAZEPOXIDE HCL 25 MG PO CAPS
25.0000 mg | ORAL_CAPSULE | Freq: Four times a day (QID) | ORAL | Status: AC
  Administered 2013-05-16 – 2013-05-17 (×6): 25 mg via ORAL
  Filled 2013-05-16 (×6): qty 1

## 2013-05-16 MED ORDER — ALUM & MAG HYDROXIDE-SIMETH 200-200-20 MG/5ML PO SUSP
30.0000 mL | ORAL | Status: DC | PRN
  Administered 2013-05-18 – 2013-05-22 (×6): 30 mL via ORAL

## 2013-05-16 MED ORDER — VITAMIN B-1 100 MG PO TABS
100.0000 mg | ORAL_TABLET | Freq: Every day | ORAL | Status: DC
  Administered 2013-05-17 – 2013-05-26 (×10): 100 mg via ORAL
  Filled 2013-05-16 (×12): qty 1

## 2013-05-16 MED ORDER — THIAMINE HCL 100 MG/ML IJ SOLN: 100.0000 mg | Freq: Once | INTRAMUSCULAR | Status: DC

## 2013-05-16 MED ORDER — CHLORDIAZEPOXIDE HCL 25 MG PO CAPS
25.0000 mg | ORAL_CAPSULE | Freq: Once | ORAL | Status: AC
  Administered 2013-05-20: 25 mg via ORAL
  Filled 2013-05-16: qty 1

## 2013-05-16 MED ORDER — METOPROLOL TARTRATE 25 MG PO TABS
25.0000 mg | ORAL_TABLET | Freq: Two times a day (BID) | ORAL | Status: DC
  Administered 2013-05-16 – 2013-05-26 (×20): 25 mg via ORAL
  Filled 2013-05-16: qty 8
  Filled 2013-05-16 (×14): qty 1
  Filled 2013-05-16: qty 8
  Filled 2013-05-16 (×8): qty 1

## 2013-05-16 MED ORDER — HYDROXYZINE HCL 25 MG PO TABS
25.0000 mg | ORAL_TABLET | Freq: Four times a day (QID) | ORAL | Status: AC | PRN
  Administered 2013-05-16 – 2013-05-18 (×4): 25 mg via ORAL
  Filled 2013-05-16: qty 1

## 2013-05-16 NOTE — [facility] Suicide Risk Assessment (Signed)
Suicide Risk Assessment  Admission Assessment     Nursing information obtained from:  Patient Demographic factors:  Male Current Mental Status:  NA Loss Factors:  NA Historical Factors:  Family history of mental illness or substance abuse Risk Reduction Factors:  Living with another person, especially a relative;Positive social support;Positive therapeutic relationship  CLINICAL FACTORS:   Alcohol/Substance Abuse/Dependencies  COGNITIVE FEATURES THAT CONTRIBUTE TO RISK:  Closed-mindedness Polarized thinking Thought constriction (tunnel vision)    SUICIDE RISK:   Moderate:  Frequent suicidal ideation with limited intensity, and duration, some specificity in terms of plans, no associated intent, good self-control, limited dysphoria/symptomatology, some risk factors present, and identifiable protective factors, including available and accessible social support.  PLAN OF CARE: Supportive approach/coping skills/relapse prevention                               [facility] Detox protocol                               Reassess and address the co morbidities  I certify that inpatient services furnished can reasonably be expected to improve the patient's condition.  Braylynn Ghan A 05/16/2013, 3:18 PM

## 2013-05-16 NOTE — [facility] Group Notes (Signed)
[facility] LCSW Aftercare Discharge Planning Group Note   05/16/2013 10:28 AM  Participation Quality:  Appropriate   Mood/Affect:  Blunted  Depression Rating:  3  Anxiety Rating:  3  Thoughts of Suicide:  No Will you contract for safety?   No  Current AVH:  No  Plan for Discharge/Comments:  Pt stated that he is deciding if he would like inpatient at [facility] or if AA and IOP would best work for him. Pt states that he slept poorly and feels withdrawal symptoms today. Pt lives with family in [place].   Transportation Means: family   Supports: family  Smart, [place]

## 2013-05-16 NOTE — [facility] Group Notes (Signed)
[facility] LCSW Group Therapy  05/16/2013 4:31 PM  Type of Therapy:  Group Therapy  Participation Level:  Minimal  Participation Quality:  Drowsy  Affect:  Appropriate  Cognitive:  Appropriate  Insight:  Improving  Engagement in Therapy:  Improving  Modes of Intervention:  Education, Exploration, Socialization and Support  Summary of Progress/Problems: Feelings around Relapse. Group members discussed the meaning of relapse and shared personal stories of relapse, how it affected them and others, and how they perceived themselves during this time. Group members were encouraged to identify triggers, warning signs and coping skills used when facing the possibility of relapse. Social supports were discussed and explored in detail. Post Acute Withdrawal Syndrome (handout provided) was introduced and examined. Pt's were encouraged to ask questions, talk about key points associated with PAWS, and process this information in terms of relapse prevention. [name] shared his personal story of relapse and shared why he decided to seek treatment. He stated that in times of sobriety, he found that keeping busy with work helped him avoid relapse. [name] shared that he feels disgusted with his choices and feels that he needs to learn better coping skills. When introduced to PAWS, [name] was able to process how this affects his ability to maintain sobriety and stated that it is important for him to come up with a safety plan.     [name], Zachary Schwake 05/16/2013, 4:31 PM

## 2013-05-16 NOTE — Tx Team (Signed)
Interdisciplinary Treatment Plan Update (Adult)  Date: 05/16/2013  Time Reviewed: 11:38 AM  Progress in Treatment:  Attending groups: Yes Participating in groups:  Yes Taking medication as prescribed: Yes  Tolerating medication: Yes  Family/Significant othe contact made: No  Patient understands diagnosis: Yes, AEB seeking treatment for detox and depressive symptoms. Discussing patient identified problems/goals with staff: Yes  Medical problems stabilized or resolved: Yes  Denies suicidal/homicidal ideation: Yes  Patient has not harmed self or Others: Yes  New problem(s) identified: n/a Discharge Plan or Barriers: Pt is currently exploring both inpatient and o/p options for aftercare. Pt interested in AA, o/p and possibly [facility] for i/p. Will continue to explore these options with pt.  Additional comments: Zachary [name] is an 72 y.o. male presenting to [facility] requesting alcohol detox. Sts he started drinking at age 59. He reports drinking (2) 40 oz beers daily. Patient's last drank was yesterday 05/14/2013 (2) 40oz beers. Patient denies drug use. He reports tremors as his only withdrawal symptom. He denies seizures and black outs. Patient denies SI, HI, and AVH's. He reports mild depression with associated symptoms of hopelessness and loss of interest in usual pleasures. Patient does not identify any stressors at this time. Appears to have limited support stating he resides with friends. He denies previous psych treatment, however; has received inpatient treatment at [facility] for detox in the past.  Reason for Continuation of Hospitalization: Detox Substance abuse Estimated length of stay: 3-5 days For review of initial/current patient goals, please see plan of care.  Attendees:  Patient:    Family:    Physician: Geoffery Lyons  05/16/2013 11:40 AM   Nursing: Idelia Salm RN 05/16/2013 11:40 AM   Clinical Social Worker Jkayla Spiewak Smart, LCSWA  05/16/2013 11:41 AM  Other: Philippa Chester RN 05/16/2013 11:41  AM   Other:    Other: Massie Kluver, Community Care Coordinator  05/16/2013 11:41 AM   Other:    Scribe for Treatment Team:  Trula Slade LCSWA 05/16/2013 11:41 AM

## 2013-05-16 NOTE — Progress Notes (Signed)
Adult Psychoeducational Group Note  Date:  05/16/2013 Time:  1:41 PM  Group Topic/Focus:  Relapse Prevention Planning:   The focus of this group is to define relapse and discuss the need for planning to combat relapse.  Participation Level:  Active  Participation Quality:  Attentive  Affect:  Appropriate  Cognitive:  Appropriate  Insight: Good  Engagement in Group:  Engaged and Supportive  Modes of Intervention:  Discussion, Education and Support  Additional Comments:  Pt participated in group and talked about the temptations he has faced in the past when he was in recovery. "When I was in a 28 day program and thirty minutes after I left, I wanted my girlfriend to stop at a liquor store.".  Reynolds Bowl 05/16/2013, 1:41 PM

## 2013-05-16 NOTE — Progress Notes (Signed)
D: Patient denies SI/HI and auditory and visual hallucinations. The patient has a depressed mood and affect. The patient rates his depression a 5 out of 10 and his hopelessness a 2 out of 10 (1 low/10 high). The patient reports sleeping fairly well and writes that his appetite is good and that his energy level is low. The patient reports having tremors but when observed in milieu the tremors appear very minor. The patient also reports having chills. The patient is attending groups and interacting appropriately within the milieu.  A: Patient given emotional support from RN. Patient encouraged to come to staff with concerns and/or questions. Patient's medication routine continued. Patient's orders and plan of care reviewed.  R: Patient remains cooperative. Will continue to monitor patient q15 minutes for safety.
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
## 2013-05-16 NOTE — Progress Notes (Signed)
Notified MD of patient's elevated BP (174/99). RN reviewed patient's blood pressure and reported the high trend to MD. Verbal orders given to start lopressor BID. Will continue to monitor patient for safety.

## 2013-05-17 DIAGNOSIS — F1020 Alcohol dependence, uncomplicated: Principal | ICD-10-CM

## 2013-05-17 MED ORDER — NICOTINE 21 MG/24HR TD PT24
21.0000 mg | MEDICATED_PATCH | Freq: Every day | TRANSDERMAL | Status: DC
  Administered 2013-05-17 – 2013-05-26 (×10): 21 mg via TRANSDERMAL
  Filled 2013-05-17 (×11): qty 1

## 2013-05-17 MED ORDER — TRAZODONE HCL 50 MG PO TABS
50.0000 mg | ORAL_TABLET | Freq: Every evening | ORAL | Status: DC | PRN
  Administered 2013-05-17 – 2013-05-24 (×5): 50 mg via ORAL
  Filled 2013-05-17 (×5): qty 1
  Filled 2013-05-17: qty 8
  Filled 2013-05-17: qty 1

## 2013-05-17 MED ORDER — NICOTINE 21 MG/24HR TD PT24
MEDICATED_PATCH | TRANSDERMAL | Status: AC
  Filled 2013-05-17: qty 1

## 2013-05-17 NOTE — Progress Notes (Signed)
D   Pt did complain of feeling very sleepy today   He does have an unsteady gait and requested a wheelchair to go to the cafeteria   He is irritable at times and has an unkept appearance   He tends to isolate and has little interaction with others  He denies suicidal and homicidal ideation A   Verbal support given   Medications administered and effectiveness monitored   Q 15 min checks R   Pt safe at present 

## 2013-05-17 NOTE — Progress Notes (Signed)
D.  Pt pleasant on approach, states that he has been anxious and was hoping for a medication for sleep tonight.  Positive for evening AA group, see group notes.  Denies SI/HI/hallucinations at this time.  Interacting appropriately within milieu.  A.  Support and encouragement offered  R.  Pt remains safe on unit, will continue to monitor.

## 2013-05-17 NOTE — Progress Notes (Signed)
[facility] MD Progress Note  05/17/2013 12:20 PM Zachary [name]  MRN:  [id number] Subjective:  Zachary [name] is lying in bed today at lunchtime, and reports that the medications are making him drowsy. He endorses withdrawal symptoms including feelings of dizziness and tremors. He denies any cravings for alcohol. He endorses that he is sleeping well, and that his appetite is good. He denies any suicidal or homicidal ideation. He denies any auditory or visual hallucinations. He rates his depression as a  5 or 6 on a scale of 1-10, where 10 is the worst. He rates his anxiety as a 3 or 4 on a scale of 1-10. 5 or 6 on a scale of 1-10, where 10 is the worst. After discharge he plans to return home to live with his friends. He states that he needs to go to meetings, but does not have information on where they're located.   Diagnosis:   Axis I: Alcohol dependence Axis II: Deferred Axis III:  Past Medical History  Diagnosis Date  . Hypertension   . Coronary artery disease   . Hyperlipemia   . Angina   . Shortness of breath   . Mental disorder   . Arthritis   . COPD (chronic obstructive pulmonary disease)   . Hiatal hernia   . Depression   . Gout   . Joint pain   . Depression   . Anxiety     ADL's:  Intact  Sleep: Good  Appetite:  Good  Suicidal Ideation:  Patient denies any thought, plan, or intent  Homicidal Ideation:  Patient denies any thought, plan, or intent  AEB (as evidenced by):  Psychiatric Specialty Exam: Review of Systems  Constitutional: Negative.   HENT: Negative.   Eyes: Negative.   Respiratory: Negative.   Cardiovascular: Negative.   Gastrointestinal: Negative.   Genitourinary: Negative.   Musculoskeletal: Negative.   Skin: Negative.   Neurological: Positive for dizziness and tremors.  Endo/Heme/Allergies: Negative.   Psychiatric/Behavioral: Positive for depression. The patient is nervous/anxious.     Blood pressure 134/87, pulse 51, temperature 98.9 F (37.2 C),  temperature source Oral, resp. rate 18, height 5\' 6"  (1.676 m), weight 62.596 kg (138 lb).Body mass index is 22.28 kg/(m^2).  General Appearance: Disheveled  Eye Contact::  Good  Speech:  Clear and Coherent  Volume:  Normal  Mood:  Dysphoric  Affect:  Congruent  Thought Process:  Logical  Orientation:  Full (Time, Place, and Person)  Thought Content:  WDL  Suicidal Thoughts:  No  Homicidal Thoughts:  No  Memory:  Immediate;   Good Recent;   Good Remote;   Good  Judgement:  Impaired  Insight:  Lacking  Psychomotor Activity:  Normal  Concentration:  Good  Recall:  Good  Akathisia:  No  Handed:  Right  AIMS (if indicated):     Assets:  Communication Skills Resilience  Sleep:  Number of Hours: 5.5   Current Medications: Current Facility-Administered Medications  Medication Dose Route Frequency Provider Last Rate Last Dose  . acetaminophen (TYLENOL) tablet 650 mg  650 mg Oral Q6H PRN Shuvon Rankin, NP   650 mg at 05/17/13 0813  . alum & mag hydroxide-simeth (MAALOX/MYLANTA) 200-200-20 MG/5ML suspension 30 mL  30 mL Oral Q4H PRN Shuvon Rankin, NP      . chlordiazePOXIDE (LIBRIUM) capsule 25 mg  25 mg Oral Q6H PRN Shuvon Rankin, NP      . chlordiazePOXIDE (LIBRIUM) capsule 25 mg  25 mg Oral Once Shuvon Rankin, NP      .  chlordiazePOXIDE (LIBRIUM) capsule 25 mg  25 mg Oral TID Shuvon Rankin, NP       Followed by  . [START ON 05/18/2013] chlordiazePOXIDE (LIBRIUM) capsule 25 mg  25 mg Oral BH-qamhs Shuvon Rankin, NP       Followed by  . [START ON 05/20/2013] chlordiazePOXIDE (LIBRIUM) capsule 25 mg  25 mg Oral Daily Shuvon Rankin, NP      . gabapentin (NEURONTIN) capsule 300 mg  300 mg Oral TID Rachael Fee, MD   300 mg at 05/17/13 1155  . hydrOXYzine (ATARAX/VISTARIL) tablet 25 mg  25 mg Oral Q6H PRN Shuvon Rankin, NP   25 mg at 05/16/13 2121  . loperamide (IMODIUM) capsule 2-4 mg  2-4 mg Oral PRN Shuvon Rankin, NP      . magnesium hydroxide (MILK OF MAGNESIA) suspension 30 mL  30 mL  Oral Daily PRN Shuvon Rankin, NP      . metoprolol tartrate (LOPRESSOR) tablet 25 mg  25 mg Oral BID Rachael Fee, MD   25 mg at 05/17/13 0811  . multivitamin with minerals tablet 1 tablet  1 tablet Oral Daily Shuvon Rankin, NP   1 tablet at 05/17/13 0812  . nicotine (NICODERM CQ - dosed in mg/24 hours) 21 mg/24hr patch           . nicotine (NICODERM CQ - dosed in mg/24 hours) patch 21 mg  21 mg Transdermal Q0600 Rachael Fee, MD   21 mg at 05/17/13 0837  . ondansetron (ZOFRAN-ODT) disintegrating tablet 4 mg  4 mg Oral Q6H PRN Shuvon Rankin, NP      . thiamine (B-1) injection 100 mg  100 mg Intramuscular Once Shuvon Rankin, NP      . thiamine (VITAMIN B-1) tablet 100 mg  100 mg Oral Daily Shuvon Rankin, NP   100 mg at 05/17/13 1610    Lab Results: No results found for this or any previous visit (from the past 48 hour(s)).  Physical Findings: AIMS: Facial and Oral Movements Muscles of Facial Expression: None, normal Lips and Perioral Area: None, normal Jaw: None, normal Tongue: None, normal,Extremity Movements Upper (arms, wrists, hands, fingers): None, normal Lower (legs, knees, ankles, toes): None, normal, Trunk Movements Neck, shoulders, hips: None, normal, Overall Severity Severity of abnormal movements (highest score from questions above): None, normal Incapacitation due to abnormal movements: None, normal Patient's awareness of abnormal movements (rate only patient's report): No Awareness, Dental Status Current problems with teeth and/or dentures?: No Does patient usually wear dentures?: No  CIWA:  CIWA-Ar Total: 2 COWS:     Treatment Plan Summary: Daily contact with patient to assess and evaluate symptoms and progress in treatment Medication management  Plan:  Medical Decision Making Problem Points:  Established problem, stable/improving (1) and Review of psycho-social stressors (1) Data Points:  Review or order clinical lab tests (1) Review of medication regiment & side  effects (2)  I certify that inpatient services furnished can reasonably be expected to improve the patient's condition.   WATT,ALAN 05/17/2013, 12:20 PM  Reviewed note, continue current medications.  Jacqulyn Cane, M.D.  05/17/2013 9:59 PM

## 2013-05-17 NOTE — [facility] Group Notes (Signed)
[facility] LCSW Group Therapy  Stages of Change 05/17/2013  11:15 AM    Type of Therapy:  Group Therapy  Participation Level:  Active  Participation Quality:  Appropriate  Affect:  Appropriate  Cognitive:  Appropriate  Insight:  Developing/Improving and Engaged  Engagement in Therapy:  Developing/Improving and Engaged  Modes of Intervention:  Discussion, Education, Exploration, Problem-Solving, Rapport Building, Support  Summary of Progress/Problems: The topic for today was the stages of changes.  Patient were asked to identify changes they need to make and their motivation and commitment to making changes in their lives.  Patient was tearful as he shared he has been drinking for 60 years.  He stated he has been to [facility] and would like to go back in treatment as he wants to stop drinking.    Wynn Banker 05/17/2013  11:15 AM

## 2013-05-17 NOTE — Progress Notes (Signed)
D.  Pt was in bed during evening wrap up group after receiving medication for anxiety following dinner.  Did get up later and eat a snack, currently in dayroom watching TV with peers.  Appropriate interaction within milieu.  Denies SI/HI/halluicnaitons at this time.  Pt did request medication for withdrawal shakes earlier as well as insomnia.  A.  Support and encouragement offered, doctor on call notified of Pt's request for medication to help him sleep.  New orders received.  Medication also given as ordered for withdrawal.  R.  Pt pleased with new medication order, hopeful to rest well tonight.  Will continue to monitor.

## 2013-05-17 NOTE — Progress Notes (Signed)
Psychoeducational Group Note  Date:  05/17/2013 Time:  0945 am  Group Topic/Focus:  Identifying Needs:   The focus of this group is to help patients identify their personal needs that have been historically problematic and identify healthy behaviors to address their needs.  Participation Level:  Minimal  Participation Quality:  Attentive  Affect:  Flat  Cognitive:  Lacking  Insight:  Limited  Engagement in Group:  Limited  Additional Comments:    Zachary [name] 05/17/2013,2:38 PM

## 2013-05-17 NOTE — Progress Notes (Signed)
Adult Psychoeducational Group Note  Date:  05/17/2013 Time:  0900  Group Topic/Focus:  Healthy Coping Skills  Participation Level:  None  Participation Quality:  Drowsy and Resistant  Affect:  Blunted and Defensive  Cognitive:  Appropriate  Insight: Lacking  Engagement in Group:  None  Modes of Intervention:  Clarification, Discussion and Education  Additional Comments:  Pt is resistant and preoccupied during group, and he did not participate.  Esten Dollar Shari Prows 05/17/2013, 10:51 AM

## 2013-05-17 NOTE — Progress Notes (Signed)
The patient attended the A.A. Meeting on the 300 hallway this evening.  

## 2013-05-18 LAB — COMPREHENSIVE METABOLIC PANEL
ALT: 36 U/L (ref 0–53)
AST: 52 U/L — ABNORMAL HIGH (ref 0–37)
CO2: 30 mEq/L (ref 19–32)
Calcium: 9.3 mg/dL (ref 8.4–10.5)
Chloride: 103 mEq/L (ref 96–112)
GFR calc non Af Amer: 84 mL/min — ABNORMAL LOW (ref >90)
Sodium: 138 mEq/L (ref 135–145)
Total Bilirubin: 0.3 mg/dL (ref 0.3–1.2)

## 2013-05-18 NOTE — Progress Notes (Signed)
Date: 05/17/2013  Time: 15:15pm  Group Topic/Focus:  Healthy Communication: The focus of this group is to discuss communication, barriers to communication, as well as healthy ways to communicate with others.  Participation Level: Did Not Attend  Participation Quality:  Affect:  Cognitive:  Insight:  Engagement in Group:  Modes of Intervention:  Additional Comments: Pt did not attend, pt was in the bed, refused to come.  Kaedence Connelly M  05/18/2013, 11:38 AM  

## 2013-05-18 NOTE — Progress Notes (Signed)
Psychoeducational Group Note  Date:  05/17/2013 Time:  2100  Group Topic/Focus:  wrap up group  Participation Level: Did Not Attend  Participation Quality:  Not Applicable  Affect:  Not Applicable  Cognitive:  Not Applicable  Insight:  Not Applicable  Engagement in Group: Not Applicable  Additional Comments:  Pt remained in bed sleeping.  Shelah Lewandowsky 05/18/2013, 1:59 AM

## 2013-05-18 NOTE — [facility] Group Notes (Signed)
[facility] LCSW Group Therapy  05/18/2013  10:00  Type of Therapy:  Group Therapy  Participation Level:  Did not attend    Summary of Progress/Problems:   The main focus of today's process group was to identify the patient's current support system and decide on other supports that can be put in place. Four definitions/levels of support were discussed and an exercise was utilized to show how much stronger we become with additional supports. An emphasis was placed on using counselor, doctor, therapy groups, 12-step groups, and problem-specific support groups to expand supports, as well as doing something different than has been done before.   Rod Jream Broyles, LCSW 05/18/2013 2:42 PM     

## 2013-05-18 NOTE — Progress Notes (Signed)
Psychoeducational Group Note  Date:  05/18/2013 Time:  0945 am  Group Topic/Focus:  Making Healthy Choices:   The focus of this group is to help patients identify negative/unhealthy choices they were using prior to admission and identify positive/healthier coping strategies to replace them upon discharge.  Participation Level:  Did Not Attend  Andrena Mews 05/18/2013, 10:29 AM

## 2013-05-18 NOTE — Progress Notes (Signed)
D.  Pt positive for evening AA group,  Interacting appropriately within milieu.  Denies SI/HI/hallucinations at this time.  Pt was assisted by MHT to bath tonight and states that he felt much better after this occurred.  Still some cramping in legs and remains in wheelchair.  A.  Support and encouragement offered  R.  Will continue to monitor.

## 2013-05-18 NOTE — [facility] Counselor (Signed)
Adult Comprehensive Assessment  Patient ID: Antwuan Eckley, male   DOB: [birth year], 72 y.o.   MRN: [id number]  Information Source: Information source: Patient  Current Stressors:  Educational / Learning stressors: N/A Employment / Job issues: N/A Family Relationships: N/A Surveyor, quantity / Lack of resources (include bankruptcy): Yes  Fixed income Housing / Lack of housing: N/A Physical health (include injuries & life threatening diseases): Yes  Alcohol related complications Social relationships: N/A Substance abuse: Yes  Alcoholic Bereavement / Loss: N/A  Living/Environment/Situation:  Living Arrangements: Non-relatives/Friends Living conditions (as described by patient or guardian): good How long has patient lived in current situation?: 7 years What is atmosphere in current home: Comfortable;Supportive;Loving  Family History:  Marital status: Widowed Widowed, when?: years ago Does patient have children?: No  Childhood History:  By whom was/is the patient raised?: Both parents Additional childhood history information: Nothing significant Description of patient's relationship with caregiver when they were a child: good Patient's description of current relationship with people who raised him/her: both deceased Does patient have siblings?: Yes Number of Siblings: 10 Description of patient's current relationship with siblings: good Did patient suffer any verbal/emotional/physical/sexual abuse as a child?: No Did patient suffer from severe childhood neglect?: No Has patient ever been sexually abused/assaulted/raped as an adolescent or adult?: No Was the patient ever a victim of a crime or a disaster?: No Witnessed domestic violence?: No Has patient been effected by domestic violence as an adult?: No  Education:  Highest grade of school patient has completed: 6 Currently a Consulting civil engineer?: No Learning disability?: No  Employment/Work Situation:   Employment situation: Unemployed  (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) Psychologist, clinical job has been impacted by current illness: No What is the longest time patient has a held a job?: 20 years Where was the patient employed at that time?: [employer] Has patient ever been in the [employer]?: No Has patient ever served in Buyer, retail?: No  Financial Resources:   Does patient have a Lawyer or guardian?: No  Alcohol/Substance Abuse:   What has been your use of drugs/alcohol within the last 12 months?: drinking 160 plus ounces of beer a day Alcohol/Substance Abuse Treatment Hx: Past detox;Past Tx, Outpatient Has alcohol/substance abuse ever caused legal problems?: No  Social Support System:   Patient's Community Support System: Fair Describe Community Support System: Ava and her daughter-friends with whom he lives Type of faith/religion: N/A  Leisure/Recreation:   Leisure and Hobbies: painting, drawing  Strengths/Needs:   What things does the patient do well?: anything with Art In what areas does patient struggle / problems for patient: drinking  Discharge Plan:   Does patient have access to transportation?: Yes Will patient be returning to same living situation after discharge?: Yes Currently receiving community mental health services: No If no, would patient like referral for services when discharged?: Yes (What county[place] Medical sales representative) Does patient have financial barriers related to discharge medications?: No  Summary/Recommendations:   Summary and Recommendations (to be completed by the evaluator): Marcial is a 72 YO gentleman who is here for detox from alcohol.  He is been here 3 times in the past for the same.  He is interested in going to [facility] rehab.  He can benefit from crises stabilization, medication managment, therapeutic milieu and referral for services.  Daryel Gerald B. 05/18/2013

## 2013-05-18 NOTE — Progress Notes (Signed)
Patient did attend the evening speaker AA meeting.  

## 2013-05-18 NOTE — Progress Notes (Signed)
D   Pt did complain of feeling very sleepy today   He does have an unsteady gait and requested a wheelchair to go to the cafeteria   He is irritable at times and has an unkept appearance   He tends to isolate and has little interaction with others  He denies suicidal and homicidal ideation A   Verbal support given   Medications administered and effectiveness monitored   Q 15 min checks R   Pt safe at present

## 2013-05-18 NOTE — Progress Notes (Signed)
[facility] MD Progress Note  05/18/2013 2:39 PM Zachary [name]  MRN:  [id number] Subjective:  Zachary [name] is lying in bed again this afternoon and states he is doing "all right." He claims that he is having muscle cramps and wants some potassium. He denies that he is having any depression or anxiety. He denies that he is having any cravings or withdrawal symptoms. He continues to report that the medication is making him drowsy. He denies any suicidal or homicidal ideation, or auditory or visual hallucinations.  Diagnosis:   Axis I: Alcohol dependence Axis II: Deferred Axis III:  Past Medical History  Diagnosis Date  . Hypertension   . Coronary artery disease   . Hyperlipemia   . Angina   . Shortness of breath   . Mental disorder   . Arthritis   . COPD (chronic obstructive pulmonary disease)   . Hiatal hernia   . Depression   . Gout   . Joint pain   . Depression   . Anxiety     ADL's:  Intact  Sleep: Good  Appetite:  Good  Suicidal Ideation:  Patient denies any thought, plan, or intent Homicidal Ideation:  Patient denies any thought, plan, or intent AEB (as evidenced by):  Psychiatric Specialty Exam: Review of Systems  Constitutional: Negative.   HENT: Negative.   Eyes: Negative.   Respiratory: Negative.   Cardiovascular: Negative.   Gastrointestinal: Negative.   Genitourinary: Negative.   Musculoskeletal: Positive for myalgias.  Skin: Negative.   Neurological: Negative.   Endo/Heme/Allergies: Negative.   Psychiatric/Behavioral: Negative.     Blood pressure 120/71, pulse 62, temperature 98 F (36.7 C), temperature source Oral, resp. rate 16, height 5\' 6"  (1.676 m), weight 62.596 kg (138 lb).Body mass index is 22.28 kg/(m^2).  General Appearance: Disheveled  Eye Contact::  Poor  Speech:  Garbled  Volume:  Decreased  Mood:  Depressed  Affect:  Congruent  Thought Process:  Logical  Orientation:  Full (Time, Place, and Person)  Thought Content:  WDL  Suicidal  Thoughts:  No  Homicidal Thoughts:  No  Memory:  Immediate;   Good Recent;   Good Remote;   Good  Judgement:  Fair  Insight:  Fair  Psychomotor Activity:  Decreased  Concentration:  Good  Recall:  Good  Akathisia:  No  Handed:  Right  AIMS (if indicated):     Assets:  Communication Skills  Sleep:  Number of Hours: 4.75   Current Medications: Current Facility-Administered Medications  Medication Dose Route Frequency Provider Last Rate Last Dose  . acetaminophen (TYLENOL) tablet 650 mg  650 mg Oral Q6H PRN Shuvon Rankin, NP   650 mg at 05/17/13 0813  . alum & mag hydroxide-simeth (MAALOX/MYLANTA) 200-200-20 MG/5ML suspension 30 mL  30 mL Oral Q4H PRN Shuvon Rankin, NP      . chlordiazePOXIDE (LIBRIUM) capsule 25 mg  25 mg Oral Q6H PRN Shuvon Rankin, NP   25 mg at 05/18/13 0558  . chlordiazePOXIDE (LIBRIUM) capsule 25 mg  25 mg Oral Once Shuvon Rankin, NP      . chlordiazePOXIDE (LIBRIUM) capsule 25 mg  25 mg Oral TID Shuvon Rankin, NP   25 mg at 05/18/13 1253   Followed by  . chlordiazePOXIDE (LIBRIUM) capsule 25 mg  25 mg Oral BH-qamhs Shuvon Rankin, NP      . gabapentin (NEURONTIN) capsule 300 mg  300 mg Oral TID Rachael Fee, MD   300 mg at 05/18/13 1253  . hydrOXYzine (ATARAX/VISTARIL) tablet 25  mg  25 mg Oral Q6H PRN Shuvon Rankin, NP   25 mg at 05/17/13 1729  . loperamide (IMODIUM) capsule 2-4 mg  2-4 mg Oral PRN Shuvon Rankin, NP      . magnesium hydroxide (MILK OF MAGNESIA) suspension 30 mL  30 mL Oral Daily PRN Shuvon Rankin, NP      . metoprolol tartrate (LOPRESSOR) tablet 25 mg  25 mg Oral BID Rachael Fee, MD   25 mg at 05/18/13 0807  . multivitamin with minerals tablet 1 tablet  1 tablet Oral Daily Shuvon Rankin, NP   1 tablet at 05/18/13 0807  . nicotine (NICODERM CQ - dosed in mg/24 hours) patch 21 mg  21 mg Transdermal Q0600 Rachael Fee, MD   21 mg at 05/18/13 0602  . ondansetron (ZOFRAN-ODT) disintegrating tablet 4 mg  4 mg Oral Q6H PRN Shuvon Rankin, NP      .  thiamine (B-1) injection 100 mg  100 mg Intramuscular Once Shuvon Rankin, NP      . thiamine (VITAMIN B-1) tablet 100 mg  100 mg Oral Daily Shuvon Rankin, NP   100 mg at 05/18/13 0807  . traZODone (DESYREL) tablet 50 mg  50 mg Oral QHS PRN,MR X 1 Larena Sox, MD   50 mg at 05/17/13 2207    Lab Results: No results found for this or any previous visit (from the past 48 hour(s)).  Physical Findings: AIMS: Facial and Oral Movements Muscles of Facial Expression: None, normal Lips and Perioral Area: None, normal Jaw: None, normal Tongue: None, normal,Extremity Movements Upper (arms, wrists, hands, fingers): None, normal Lower (legs, knees, ankles, toes): None, normal, Trunk Movements Neck, shoulders, hips: None, normal, Overall Severity Severity of abnormal movements (highest score from questions above): None, normal Incapacitation due to abnormal movements: None, normal Patient's awareness of abnormal movements (rate only patient's report): No Awareness, Dental Status Current problems with teeth and/or dentures?: No Does patient usually wear dentures?: No  CIWA:  CIWA-Ar Total: 10 COWS:     Treatment Plan Summary: Daily contact with patient to assess and evaluate symptoms and progress in treatment Medication management  Plan: We will check a metabolic panel and if his potassium is low we will give him supplementation.  Medical Decision Making Problem Points:  Established problem, stable/improving (1) and Review of psycho-social stressors (1) Data Points:  Review or order clinical lab tests (1) Review of medication regiment & side effects (2)  I certify that inpatient services furnished can reasonably be expected to improve the patient's condition.   WATT,ALAN 05/18/2013, 2:39 PM  Reviewed note, agree with findings and plan.  Jacqulyn Cane, M.D.  05/18/2013 8:07 PM

## 2013-05-19 MED ORDER — HYDROXYZINE HCL 25 MG PO TABS
25.0000 mg | ORAL_TABLET | Freq: Four times a day (QID) | ORAL | Status: DC | PRN
  Administered 2013-05-19 – 2013-05-24 (×3): 25 mg via ORAL
  Filled 2013-05-19: qty 16

## 2013-05-19 MED ORDER — GABAPENTIN 100 MG PO CAPS
200.0000 mg | ORAL_CAPSULE | Freq: Three times a day (TID) | ORAL | Status: DC
  Administered 2013-05-19 – 2013-05-23 (×11): 200 mg via ORAL
  Filled 2013-05-19 (×17): qty 2

## 2013-05-19 NOTE — Progress Notes (Signed)
Adult Psychoeducational Group Note  Date:  05/19/2013 Time:  11:59 PM  Group Topic/Focus:  Wrap-Up Group:   The focus of this group is to help patients review their daily goal of treatment and discuss progress on daily workbooks.  Participation Level:  Did Not Attend  Participation Quality:    Affect:    Cognitive:    Insight:   Engagement in Group:    Modes of Intervention:    Additional Comments:  Humberto Seals Monique 05/19/2013, 11:59 PM

## 2013-05-19 NOTE — Progress Notes (Addendum)
Patient ID: Zachary [name], male   DOB: [birth year], 72 y.o.   MRN: [id number] [facility] MD Progress Note  05/19/2013 1:51 PM [name]  MRN:  [id number]  Subjective:  Zachary [name] is lying in bed again this morning, but arousable. He is able to communicate, however, his speech is garbled. Staff reportss that patient is week and frail, and has been urinating on the floor. Has been made to not have a room mate in this room"  Diagnosis:   Axis I: Alcohol dependence Axis II: Deferred Axis III:  Past Medical History  Diagnosis Date  . Hypertension   . Coronary artery disease   . Hyperlipemia   . Angina   . Shortness of breath   . Mental disorder   . Arthritis   . COPD (chronic obstructive pulmonary disease)   . Hiatal hernia   . Depression   . Gout   . Joint pain   . Depression   . Anxiety     ADL's:  Impaired  Sleep: Good  Appetite:  Good  Suicidal Ideation:  Patient denies any thought, plan, or intent Homicidal Ideation:  Patient denies any thought, plan, or intent  AEB (as evidenced by):  Psychiatric Specialty Exam: Review of Systems  Constitutional: Negative.   HENT: Negative.   Eyes: Negative.   Respiratory: Negative.   Cardiovascular: Negative.   Gastrointestinal: Negative.   Genitourinary: Negative.   Musculoskeletal: Positive for myalgias.  Skin: Negative.   Neurological: Negative.   Endo/Heme/Allergies: Negative.   Psychiatric/Behavioral: Negative.     Blood pressure 114/72, pulse 67, temperature 98 F (36.7 C), temperature source Oral, resp. rate 16, height 5\' 6"  (1.676 m), weight 62.596 kg (138 lb).Body mass index is 22.28 kg/(m^2).  General Appearance: Disheveled  Eye Contact::  Poor  Speech:  Garbled  Volume:  Decreased  Mood:  Depressed,   Affect:  Congruent  Thought Process:  Fairy rational  Orientation:  Full (Time, Place, and Person)  Thought Content:  Denies hallucinations.  Suicidal Thoughts:  No  Homicidal Thoughts:  No  Memory:   Immediate;   Good Recent;   Good Remote;   Good  Judgement:  Fair  Insight:  Fair  Psychomotor Activity:  Decreased  Concentration:  Good  Recall:  Good  Akathisia:  No  Handed:  Right  AIMS (if indicated):     Assets:  Communication Skills  Sleep:  Number of Hours: 6   Current Medications: Current Facility-Administered Medications  Medication Dose Route Frequency Provider Last Rate Last Dose  . acetaminophen (TYLENOL) tablet 650 mg  650 mg Oral Q6H PRN Shuvon Rankin, NP   650 mg at 05/17/13 0813  . alum & mag hydroxide-simeth (MAALOX/MYLANTA) 200-200-20 MG/5ML suspension 30 mL  30 mL Oral Q4H PRN Shuvon Rankin, NP   30 mL at 05/19/13 1230  . chlordiazePOXIDE (LIBRIUM) capsule 25 mg  25 mg Oral Once Shuvon Rankin, NP      . gabapentin (NEURONTIN) capsule 300 mg  300 mg Oral TID Rachael Fee, MD   300 mg at 05/19/13 1230  . magnesium hydroxide (MILK OF MAGNESIA) suspension 30 mL  30 mL Oral Daily PRN Shuvon Rankin, NP      . metoprolol tartrate (LOPRESSOR) tablet 25 mg  25 mg Oral BID Rachael Fee, MD   25 mg at 05/19/13 0845  . multivitamin with minerals tablet 1 tablet  1 tablet Oral Daily Shuvon Rankin, NP   1 tablet at 05/19/13 0845  . nicotine (NICODERM CQ -  dosed in mg/24 hours) patch 21 mg  21 mg Transdermal Q0600 Rachael Fee, MD   21 mg at 05/19/13 0600  . thiamine (B-1) injection 100 mg  100 mg Intramuscular Once Shuvon Rankin, NP      . thiamine (VITAMIN B-1) tablet 100 mg  100 mg Oral Daily Shuvon Rankin, NP   100 mg at 05/19/13 0845  . traZODone (DESYREL) tablet 50 mg  50 mg Oral QHS PRN,MR X 1 Larena Sox, MD   50 mg at 05/18/13 2223    Lab Results:  Results for orders placed during the hospital encounter of 05/15/13 (from the past 48 hour(s))  COMPREHENSIVE METABOLIC PANEL     Status: Abnormal   Collection Time    05/18/13  5:30 PM      Result Value Range   Sodium 138  135 - 145 mEq/L   Potassium 3.5  3.5 - 5.1 mEq/L   Chloride 103  96 - 112 mEq/L   CO2 30   19 - 32 mEq/L   Glucose, Bld 182 (*) 70 - 99 mg/dL   BUN 16  6 - 23 mg/dL   Creatinine, Ser 1.61  0.50 - 1.35 mg/dL   Calcium 9.3  8.4 - 09.6 mg/dL   Total Protein 6.5  6.0 - 8.3 g/dL   Albumin 2.5 (*) 3.5 - 5.2 g/dL   AST 52 (*) 0 - 37 U/L   ALT 36  0 - 53 U/L   Alkaline Phosphatase 92  39 - 117 U/L   Total Bilirubin 0.3  0.3 - 1.2 mg/dL   GFR calc non Af Amer 84 (*) >90 mL/min   GFR calc Af Amer >90  >90 mL/min   Comment:            The eGFR has been calculated     using the CKD EPI equation.     This calculation has not been     validated in all clinical     situations.     eGFR's persistently     <90 mL/min signify     possible Chronic Kidney Disease.    Physical Findings: AIMS: Facial and Oral Movements Muscles of Facial Expression: None, normal Lips and Perioral Area: None, normal Jaw: None, normal Tongue: None, normal,Extremity Movements Upper (arms, wrists, hands, fingers): None, normal Lower (legs, knees, ankles, toes): None, normal, Trunk Movements Neck, shoulders, hips: None, normal, Overall Severity Severity of abnormal movements (highest score from questions above): None, normal Incapacitation due to abnormal movements: None, normal Patient's awareness of abnormal movements (rate only patient's report): No Awareness, Dental Status Current problems with teeth and/or dentures?: No Does patient usually wear dentures?: No  CIWA:  CIWA-Ar Total: 3 COWS:     Treatment Plan Summary: Daily contact with patient to assess and evaluate symptoms and progress in treatment Medication management  Plan: Supportive approach/coping skills/relapse prevention. Decreased Gabapentin from 300 mg to 200 mg tid. Reviewed recent lab reports. Obtain hemoglobin A1C. PT to eval and treat, week gait and balance. Make room 304 a do not admit related to patient urinating on the floor. Encouraged out of room, participation in group sessions and application of coping skills when  distressed. Will continue to monitor response to/adverse effects of medications in use to assure effectiveness. Continue to monitor mood, behavior and interaction with staff and other patients. Continue current plan of care.  Medical Decision Making Problem Points:  Established problem, stable/improving (1) and Review of psycho-social stressors (1) Data  Points:  Review or order clinical lab tests (1) Review of medication regiment & side effects (2)  I certify that inpatient services furnished can reasonably be expected to improve the patient's condition.   Armandina Stammer I, PMHNP-BC 05/19/2013, 1:51 PM

## 2013-05-19 NOTE — Progress Notes (Signed)
This morning Pt had urinated all over floor in bathroom, and had soiled his clothing.  Pt is extremely unsteady on his feet and is utilizing a wheelchair.  This Clinical research associate and MHT had to assist patient to dress this morning and to clean up as he could not do so on his own.  MHT assisted Pt with bath last night.  Pt will probably need to be a 1:1 for safety.

## 2013-05-19 NOTE — [facility] Group Notes (Signed)
[facility] LCSW Group Therapy  05/19/2013 4:32 PM  Type of Therapy:  Group Therapy  Participation Level:  Did Not Attend:  Zachary [name] 05/19/2013, 4:32 PM

## 2013-05-19 NOTE — [facility] Group Notes (Signed)
[facility] LCSW Aftercare Discharge Planning Group Note   05/19/2013 11:57 AM  Participation Quality:  DID NOT ATTEND   Smart, Lebron Quam

## 2013-05-19 NOTE — [facility] Suicide Risk Assessment (Signed)
[facility] INPATIENT: Family/Significant Other Suicide Prevention Education  Suicide Prevention Education:  Education Completed; No one has been identified by the patient as the family member/significant other with whom the patient will be residing, and identified as the person(s) who will aid the patient in the event of a mental health crisis (suicidal ideations/suicide attempt). With written consent from the patient, the family member/significant other has been provided the following suicide prevention education, prior to the and/or following the discharge of the patient.  The suicide prevention education provided includes the following:  Suicide risk factors  Suicide prevention and interventions  National Suicide Hotline telephone number  [facility] assessment telephone number  [facility] Emergency Assistance 911  [facility] and/or Residential Mobile Crisis Unit telephone number Request made of family/significant other to:  Remove weapons (e.g., guns, rifles, knives), all items previously/currently identified as safety concern.  Remove drugs/medications (over-the-counter, prescriptions, illicit drugs), all items previously/currently identified as a safety concern. The family member/significant other verbalizes understanding of the suicide prevention education information provided. The family member/significant other agrees to remove the items of safety concern listed above. Pt did not c/o SI at admission, nor have they endorsed SI during their stay here. SPE not required.  [employer], LCSWA  05/19/2013 4:53 PM

## 2013-05-19 NOTE — Progress Notes (Signed)
Adult Psychoeducational Group Note  Date:  05/19/2013 Time:  11:00AM  Group Topic/Focus:  Self Care:   The focus of this group is to help patients understand the importance of self-care in order to improve or restore emotional, physical, spiritual, interpersonal, and financial health.  Participation Level:  Did Not Attend  Additional Comments:  Pt did not attend group therapy. Pt was in his room, in the bed, due to his being unsteady on his feet  Shalisa Mcquade K 05/19/2013, 1:01 PM

## 2013-05-20 MED ORDER — IPRATROPIUM BROMIDE 0.02 % IN SOLN
0.5000 mg | Freq: Four times a day (QID) | RESPIRATORY_TRACT | Status: DC | PRN
  Administered 2013-05-20 – 2013-05-21 (×3): 0.5 mg via RESPIRATORY_TRACT

## 2013-05-20 MED ORDER — ALBUTEROL SULFATE HFA 108 (90 BASE) MCG/ACT IN AERS
2.0000 | INHALATION_SPRAY | RESPIRATORY_TRACT | Status: DC | PRN
  Administered 2013-05-21: 2 via RESPIRATORY_TRACT
  Filled 2013-05-20: qty 6.7

## 2013-05-20 NOTE — Progress Notes (Signed)
  Date:  05/20/2013 Time:  10:00pm  Group Topic/Focus:  Recovery Goals:   The focus of this group is to identify appropriate goals for recovery and establish a plan to achieve them.  Participation Level:  Active  Participation Quality:  Appropriate, Sharing and Supportive  Affect:  Appropriate  Cognitive:  Appropriate  Insight: Appropriate  Engagement in Group:  Engaged and Supportive  Modes of Intervention:  Discussion, Education and Support  Additional Comments:  Pt was appropriate and participated in group.   Emalyn Schou M 05/20/2013, 7:35 PM  

## 2013-05-20 NOTE — Progress Notes (Signed)
Pt reports he is feeling shaky this evening.  He is using a wheelchair to get around the unit d/t his unsteadiness.  He says he was not like this before he was admitted.  He says, "something is fishy".  Explained to pt that withdrawal may not be the same each time.  He has been to [facility] for detox in the past.  Pt also c/o indigestion which he has had all day.  He was given Maalox at this time.  Pt's protocol prn meds were discontinued today, so PA was notified who reordered Vistaril 25 mg q6h for anxiety/shakiness.  Pt went to his room and did not attend evening group.  Pt makes his needs known to staff.  Pt denies SI/HI/AV.  Pt wants to go to long term treatment after [facility] detox.  Support and encouragement offered.  Safety maintained with q15 minute checks.
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
## 2013-05-20 NOTE — [facility] Group Notes (Signed)
[facility] LCSW Aftercare Discharge Planning Group Note   05/20/2013 10:08 AM  Participation Quality:  DID NOT ATTEND    Smart, Zachary [name]

## 2013-05-20 NOTE — [facility] Group Notes (Signed)
[facility] LCSW Group Therapy  05/20/2013 1:32 PM  Type of Therapy:  Group Therapy  Participation Level:  Did Not Attend  Smart, Antavious Spanos 05/20/2013, 1:32 PM

## 2013-05-20 NOTE — Progress Notes (Signed)
Recreation Therapy Notes  Date: 06.17.2014 Time: 2:30pm Location: [address] Dayroom      Group Topic/Focus: Musician (AAA/T)  Participation Level: Active  Participation Quality: Appropriate  Affect: Euthymic  Cognitive: Appropriate  Additional Comments: 06.17.2014 Session = AAA Session; Dog Team = [facility] & handler  Patient pet and visited with Silas. Patient asked LRT if he could see the dog because he "is a drunk." LRT assured patient he had signed the correct paperwork in order to visit with the [employer] dog team. Patient appeared to enjoy petting [place], as he smiled and talked to [place] while he was petting him.   Marykay Lex Jazara Swiney, LRT/CTRS  Chyler Creely L 05/20/2013 4:21 PM
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
## 2013-05-20 NOTE — Progress Notes (Signed)
Patient ID: Zachary [name], male   DOB: 1941-04-14, 72 y.o.   MRN: [id number] Patient ID: Zachary [name], male   DOB: 03/30/1941, 72 y.o.   MRN: [id number] [facility] MD Progress Note  05/20/2013 4:09 PM Zachary [name]  MRN:  [id number]  Subjective:  Zachary [name] is more alert today. He continues to receive detoxification treatment protocol for alcohol intoxication. He denies any sihi, however, remains unsteady on his feel and has some tremors to hand areas. He is currently receiving assistance with adls. He presents with some wheezes and SOB with mild activities.   Diagnosis:   Axis I: Alcohol dependence Axis II: Deferred Axis III:  Past Medical History  Diagnosis Date  . Hypertension   . Coronary artery disease   . Hyperlipemia   . Angina   . Shortness of breath   . Mental disorder   . Arthritis   . COPD (chronic obstructive pulmonary disease)   . Hiatal hernia   . Depression   . Gout   . Joint pain   . Depression   . Anxiety     ADL's:  Impaired  Sleep: Good  Appetite:  Good  Suicidal Ideation:  Patient denies any thought, plan, or intent Homicidal Ideation:  Patient denies any thought, plan, or intent  AEB (as evidenced by):  Psychiatric Specialty Exam: Review of Systems  Constitutional: Negative.   HENT: Negative.   Eyes: Negative.   Respiratory: Positive for shortness of breath and wheezing.   Cardiovascular: Negative.   Gastrointestinal: Negative.   Genitourinary: Negative.   Musculoskeletal: Positive for myalgias.  Skin: Negative.   Neurological: Negative.   Endo/Heme/Allergies: Negative.   Psychiatric/Behavioral: Negative.     Blood pressure 142/72, pulse 68, temperature 98.9 F (37.2 C), temperature source Oral, resp. rate 16, height 5\' 6"  (1.676 m), weight 62.596 kg (138 lb).Body mass index is 22.28 kg/(m^2).  General Appearance: Disheveled  Eye Contact::  Poor  Speech:  Garbled  Volume:  Decreased  Mood:  Depressed,   Affect:  Congruent  Thought  Process:  Fairy rational  Orientation:  Full (Time, Place, and Person)  Thought Content:  Denies hallucinations.  Suicidal Thoughts:  No  Homicidal Thoughts:  No  Memory:  Immediate;   Good Recent;   Good Remote;   Good  Judgement:  Fair  Insight:  Fair  Psychomotor Activity:  Decreased  Concentration:  Good  Recall:  Good  Akathisia:  No  Handed:  Right  AIMS (if indicated):     Assets:  Communication Skills  Sleep:  Number of Hours: 4.25   Current Medications: Current Facility-Administered Medications  Medication Dose Route Frequency Provider Last Rate Last Dose  . acetaminophen (TYLENOL) tablet 650 mg  650 mg Oral Q6H PRN Shuvon Rankin, NP   650 mg at 05/17/13 0813  . albuterol (PROVENTIL HFA;VENTOLIN HFA) 108 (90 BASE) MCG/ACT inhaler 2 puff  2 puff Inhalation Q4H PRN Sanjuana Kava, NP      . alum & mag hydroxide-simeth (MAALOX/MYLANTA) 200-200-20 MG/5ML suspension 30 mL  30 mL Oral Q4H PRN Shuvon Rankin, NP   30 mL at 05/20/13 1405  . gabapentin (NEURONTIN) capsule 200 mg  200 mg Oral TID Sanjuana Kava, NP   200 mg at 05/20/13 1217  . hydrOXYzine (ATARAX/VISTARIL) tablet 25 mg  25 mg Oral Q6H PRN Kerry Hough, PA-C   25 mg at 05/19/13 2038  . ipratropium (ATROVENT) nebulizer solution 0.5 mg  0.5 mg Nebulization Q6H PRN Sanjuana Kava, NP      .  magnesium hydroxide (MILK OF MAGNESIA) suspension 30 mL  30 mL Oral Daily PRN Shuvon Rankin, NP      . metoprolol tartrate (LOPRESSOR) tablet 25 mg  25 mg Oral BID Rachael Fee, MD   25 mg at 05/20/13 1610  . multivitamin with minerals tablet 1 tablet  1 tablet Oral Daily Shuvon Rankin, NP   1 tablet at 05/20/13 0812  . nicotine (NICODERM CQ - dosed in mg/24 hours) patch 21 mg  21 mg Transdermal Q0600 Rachael Fee, MD   21 mg at 05/20/13 9604  . thiamine (B-1) injection 100 mg  100 mg Intramuscular Once Shuvon Rankin, NP      . thiamine (VITAMIN B-1) tablet 100 mg  100 mg Oral Daily Shuvon Rankin, NP   100 mg at 05/20/13 0811  .  traZODone (DESYREL) tablet 50 mg  50 mg Oral QHS PRN,MR X 1 Larena Sox, MD   50 mg at 05/18/13 2223    Lab Results:  Results for orders placed during the hospital encounter of 05/15/13 (from the past 48 hour(s))  COMPREHENSIVE METABOLIC PANEL     Status: Abnormal   Collection Time    05/18/13  5:30 PM      Result Value Range   Sodium 138  135 - 145 mEq/L   Potassium 3.5  3.5 - 5.1 mEq/L   Chloride 103  96 - 112 mEq/L   CO2 30  19 - 32 mEq/L   Glucose, Bld 182 (*) 70 - 99 mg/dL   BUN 16  6 - 23 mg/dL   Creatinine, Ser 5.40  0.50 - 1.35 mg/dL   Calcium 9.3  8.4 - 98.1 mg/dL   Total Protein 6.5  6.0 - 8.3 g/dL   Albumin 2.5 (*) 3.5 - 5.2 g/dL   AST 52 (*) 0 - 37 U/L   ALT 36  0 - 53 U/L   Alkaline Phosphatase 92  39 - 117 U/L   Total Bilirubin 0.3  0.3 - 1.2 mg/dL   GFR calc non Af Amer 84 (*) >90 mL/min   GFR calc Af Amer >90  >90 mL/min   Comment:            The eGFR has been calculated     using the CKD EPI equation.     This calculation has not been     validated in all clinical     situations.     eGFR's persistently     <90 mL/min signify     possible Chronic Kidney Disease.  HEMOGLOBIN A1C     Status: None   Collection Time    05/19/13  7:47 PM      Result Value Range   Hemoglobin A1C 5.1  <5.7 %   Comment: (NOTE)                                                                               According to the ADA Clinical Practice Recommendations for 2011, when     HbA1c is used as a screening test:      >=6.5%   Diagnostic of Diabetes Mellitus               (  if abnormal result is confirmed)     5.7-6.4%   Increased risk of developing Diabetes Mellitus     References:Diagnosis and Classification of Diabetes Mellitus,Diabetes     Care,2011,34(Suppl 1):S62-S69 and Standards of Medical Care in             Diabetes - 2011,Diabetes Care,2011,34 (Suppl 1):S11-S61.   Mean Plasma Glucose 100  <117 mg/dL    Physical Findings: AIMS: Facial and Oral  Movements Muscles of Facial Expression: None, normal Lips and Perioral Area: None, normal Jaw: None, normal Tongue: None, normal,Extremity Movements Upper (arms, wrists, hands, fingers): None, normal Lower (legs, knees, ankles, toes): None, normal, Trunk Movements Neck, shoulders, hips: None, normal, Overall Severity Severity of abnormal movements (highest score from questions above): None, normal Incapacitation due to abnormal movements: None, normal Patient's awareness of abnormal movements (rate only patient's report): No Awareness, Dental Status Current problems with teeth and/or dentures?: No Does patient usually wear dentures?: No  CIWA:  CIWA-Ar Total: 1 COWS:     Treatment Plan Summary: Daily contact with patient to assess and evaluate symptoms and progress in treatment Medication management  Plan: Supportive approach/coping skills/relapse prevention. Continue Gabapentin at 200 mg tid. Reviewed recent lab reports on HBGA1C See physical therapist recommendations. Order breathing treatments for shortness of breath/wheezing. Will continue to monitor response to/adverse effects of medications in use to assure effectiveness. Continue to monitor mood, behavior and interaction with staff and other patients. Continue current plan of care.  Medical Decision Making Problem Points:  Established problem, stable/improving (1) and Review of psycho-social stressors (1) Data Points:  Review or order clinical lab tests (1) Review of medication regiment & side effects (2)  I certify that inpatient services furnished can reasonably be expected to improve the patient's condition.   Armandina Stammer I, PMHNP-BC 05/20/2013, 4:09 PM

## 2013-05-21 MED ORDER — FAMOTIDINE 20 MG PO TABS
20.0000 mg | ORAL_TABLET | Freq: Two times a day (BID) | ORAL | Status: DC
  Administered 2013-05-21 – 2013-05-26 (×10): 20 mg via ORAL
  Filled 2013-05-21 (×17): qty 1

## 2013-05-21 NOTE — BHH Group Notes (Signed)
[facility] LCSW Group Therapy  05/21/2013 2:13 PM  Type of Therapy:  Group Therapy  Participation Level:  Did Not Attend  Smart, Tzirel Leonor 05/21/2013, 2:13 PM

## 2013-05-21 NOTE — BHH Group Notes (Signed)
[facility] LCSW Aftercare Discharge Planning Group Note   05/21/2013 9:20 AM  Participation Quality:  Appropriate   Mood/Affect:  Appropriate  Depression Rating:  5  Anxiety Rating:  5  Thoughts of Suicide:  No Will you contract for safety?   NA  Current AVH:  No  Plan for Discharge/Comments:  Patient plans to go to [facility]. Need to push date back. Pt now unsure if he should go inpatient. Will continue to work with patient further.   Transportation Means: unknown   Supports: Engineer, production, [employer]

## 2013-05-21 NOTE — Progress Notes (Signed)
Patient ID: Keiran Gaffey, male   DOB: 01-Mar-1941, 72 y.o.   MRN: [id number] Patient ID: Burrel Legrand, male   DOB: Oct 27, 1941, 72 y.o.   MRN: [id number] Patient ID: Miciah Covelli, male   DOB: 1941/03/03, 72 y.o.   MRN: [id number] [facility] MD Progress Note  05/21/2013 2:37 PM Samar Dass  MRN:  [id number]  Subjective:  Kacy is still complaining of tremors to upper and lower extremities. Continue to show more mental alertness. Continue to endorse depression.  Diagnosis:   Axis I: Alcohol dependence Axis II: Deferred Axis III:  Past Medical History  Diagnosis Date  . Hypertension   . Coronary artery disease   . Hyperlipemia   . Angina   . Shortness of breath   . Mental disorder   . Arthritis   . COPD (chronic obstructive pulmonary disease)   . Hiatal hernia   . Depression   . Gout   . Joint pain   . Depression   . Anxiety     ADL's:  Impaired  Sleep: Good  Appetite:  Good  Suicidal Ideation:  Patient denies any thought, plan, or intent Homicidal Ideation:  Patient denies any thought, plan, or intent  AEB (as evidenced by):  Psychiatric Specialty Exam: Review of Systems  Constitutional: Negative.   HENT: Negative.   Eyes: Negative.   Respiratory: Positive for shortness of breath and wheezing.   Cardiovascular: Negative.   Gastrointestinal: Negative.   Genitourinary: Negative.   Musculoskeletal: Positive for myalgias.  Skin: Negative.   Neurological: Negative.   Endo/Heme/Allergies: Negative.   Psychiatric/Behavioral: Negative.     Blood pressure 127/77, pulse 71, temperature 98.2 F (36.8 C), temperature source Oral, resp. rate 16, height 5\' 6"  (1.676 m), weight 62.596 kg (138 lb).Body mass index is 22.28 kg/(m^2).  General Appearance: Disheveled  Eye Contact::  Poor  Speech:  Garbled  Volume:  Decreased  Mood:  Depressed,   Affect:  Congruent  Thought Process:  Fairy rational  Orientation:  Full (Time, Place, and Person)  Thought Content:  Denies  hallucinations.  Suicidal Thoughts:  No  Homicidal Thoughts:  No  Memory:  Immediate;   Good Recent;   Good Remote;   Good  Judgement:  Fair  Insight:  Fair  Psychomotor Activity:  Decreased  Concentration:  Good  Recall:  Good  Akathisia:  No  Handed:  Right  AIMS (if indicated):     Assets:  Communication Skills  Sleep:  Number of Hours: 6.5   Current Medications: Current Facility-Administered Medications  Medication Dose Route Frequency Provider Last Rate Last Dose  . acetaminophen (TYLENOL) tablet 650 mg  650 mg Oral Q6H PRN Shuvon Rankin, NP   650 mg at 05/20/13 2111  . albuterol (PROVENTIL HFA;VENTOLIN HFA) 108 (90 BASE) MCG/ACT inhaler 2 puff  2 puff Inhalation Q4H PRN Sanjuana Kava, NP      . alum & mag hydroxide-simeth (MAALOX/MYLANTA) 200-200-20 MG/5ML suspension 30 mL  30 mL Oral Q4H PRN Shuvon Rankin, NP   30 mL at 05/20/13 1405  . gabapentin (NEURONTIN) capsule 200 mg  200 mg Oral TID Sanjuana Kava, NP   200 mg at 05/21/13 1154  . hydrOXYzine (ATARAX/VISTARIL) tablet 25 mg  25 mg Oral Q6H PRN Kerry Hough, PA-C   25 mg at 05/19/13 2038  . ipratropium (ATROVENT) nebulizer solution 0.5 mg  0.5 mg Nebulization Q6H PRN Sanjuana Kava, NP   0.5 mg at 05/21/13 1023  . magnesium hydroxide (MILK OF MAGNESIA) suspension  30 mL  30 mL Oral Daily PRN Shuvon Rankin, NP      . metoprolol tartrate (LOPRESSOR) tablet 25 mg  25 mg Oral BID Rachael Fee, MD   25 mg at 05/21/13 1610  . multivitamin with minerals tablet 1 tablet  1 tablet Oral Daily Shuvon Rankin, NP   1 tablet at 05/21/13 9604  . nicotine (NICODERM CQ - dosed in mg/24 hours) patch 21 mg  21 mg Transdermal Q0600 Rachael Fee, MD   21 mg at 05/21/13 5409  . thiamine (B-1) injection 100 mg  100 mg Intramuscular Once Shuvon Rankin, NP      . thiamine (VITAMIN B-1) tablet 100 mg  100 mg Oral Daily Shuvon Rankin, NP   100 mg at 05/21/13 8119  . traZODone (DESYREL) tablet 50 mg  50 mg Oral QHS PRN,MR X 1 Larena Sox, MD    50 mg at 05/20/13 2220    Lab Results:  Results for orders placed during the hospital encounter of 05/15/13 (from the past 48 hour(s))  HEMOGLOBIN A1C     Status: None   Collection Time    05/19/13  7:47 PM      Result Value Range   Hemoglobin A1C 5.1  <5.7 %   Comment: (NOTE)                                                                               According to the ADA Clinical Practice Recommendations for 2011, when     HbA1c is used as a screening test:      >=6.5%   Diagnostic of Diabetes Mellitus               (if abnormal result is confirmed)     5.7-6.4%   Increased risk of developing Diabetes Mellitus     References:Diagnosis and Classification of Diabetes Mellitus,Diabetes     Care,2011,34(Suppl 1):S62-S69 and Standards of Medical Care in             Diabetes - 2011,Diabetes Care,2011,34 (Suppl 1):S11-S61.   Mean Plasma Glucose 100  <117 mg/dL    Physical Findings: AIMS: Facial and Oral Movements Muscles of Facial Expression: None, normal Lips and Perioral Area: None, normal Jaw: None, normal Tongue: None, normal,Extremity Movements Upper (arms, wrists, hands, fingers): None, normal Lower (legs, knees, ankles, toes): None, normal, Trunk Movements Neck, shoulders, hips: None, normal, Overall Severity Severity of abnormal movements (highest score from questions above): None, normal Incapacitation due to abnormal movements: None, normal Patient's awareness of abnormal movements (rate only patient's report): No Awareness, Dental Status Current problems with teeth and/or dentures?: No Does patient usually wear dentures?: No  CIWA:  CIWA-Ar Total: 1 COWS:     Treatment Plan Summary: Daily contact with patient to assess and evaluate symptoms and progress in treatment Medication management  Plan: Supportive approach/coping skills/relapse prevention. Continue breathing treatments for shortness of breath/wheezing. Will continue to monitor response to/adverse effects  of medications in use to assure effectiveness. Continue to monitor mood, behavior and interaction with staff and other patients. Continue current plan of care.  Medical Decision Making Problem Points:  Established problem, stable/improving (1) and Review of psycho-social stressors (1)  Data Points:  Review or order clinical lab tests (1) Review of medication regiment & side effects (2)  I certify that inpatient services furnished can reasonably be expected to improve the patient's condition.   Armandina Stammer I, PMHNP-BC 05/21/2013, 2:37 PM

## 2013-05-21 NOTE — Progress Notes (Signed)
Pt was in his room in bed asleep at the beginning of the shift.  He easily responded to his name.  Writer could tell that pt was in pain and when asked, pt did reveal he had pain in his shoulders.  He is not as tremulous as yesterday.  Pt has no c/o nausea.  Pt's breathing is raspy.  Pt was given a Neb tx along with Tylenol for pain.  Pt was assisted up and to the dayroom by staff.  Pt denies SI/HI/AV.  Pt makes his needs known to staff.  Support and encouragement offered.  Discharge plans are in process.  Safety maintained with q15 minute checks.

## 2013-05-21 NOTE — Tx Team (Signed)
Interdisciplinary Treatment Plan Update (Adult)  Date: 05/21/2013  Time Reviewed: 11:11 AM  Progress in Treatment:  Attending groups: Yes  Participating in groups: Yes  Taking medication as prescribed: Yes  Tolerating medication: Yes  Family/Significant othe contact made: No  Patient understands diagnosis: Yes, AEB seeking treatment for detox and depressive symptoms.  Discussing patient identified problems/goals with staff: Yes  Medical problems stabilized or resolved: Yes  Denies suicidal/homicidal ideation: Yes  Patient has not harmed self or Others: Yes  New problem(s) identified: n/a  Discharge Plan or Barriers: Pt is currently exploring both inpatient and o/p options for aftercare. Pt interested in AA, o/p and possibly [facility] for i/p. Will continue to explore these options with pt.  Additional comments: pt currently in wheelchair due to dizziness/instability/high fall risk.  Reason for Continuation of Hospitalization:   Medication managment Estimated length of stay: 1-2 days  For review of initial/current patient goals, please see plan of care.  Attendees:  Patient:    Family:    Physician: Aggie PA 05/21/2013 11:12 AM   Nursing: Glenford Bayley 05/21/2013 11:12 AM   Clinical Social Worker [employer], LCSWA  05/21/2013 11:12 AM   Other: Lupita Leash RN 05/21/2013 11:13 AM   Other:    Other: Massie Kluver, Community Care Coordinator  05/21/2013 11:13 AM   Other:    Scribe for Treatment Team:  [employer] LCSWA 05/21/2013 11:13 AM

## 2013-05-21 NOTE — Progress Notes (Signed)
Adult Psychoeducational Group Note  Date:  05/21/2013 Time:  8:00PM Group Topic/Focus:  Wrap-Up Group:   The focus of this group is to help patients review their daily goal of treatment and discuss progress on daily workbooks.    Additional Comments:  Pt. Didn't attend group.    Bing Plume D 05/21/2013, 8:41 PM

## 2013-05-22 NOTE — Progress Notes (Signed)
Patient did not attend the evening karaoke group.  

## 2013-05-22 NOTE — Progress Notes (Signed)
Patient ID: Zachary [name], male   DOB: Jun 13, 1941, 72 y.o.   MRN: [id number] Patient ID: Zachary [name], male   DOB: Jul 07, 1941, 72 y.o.   MRN: [id number] Patient ID: Zachary [name], male   DOB: 01/31/1941, 72 y.o.   MRN: [id number] Patient ID: Zachary [name], male   DOB: 1941/11/13, 72 y.o.   MRN: [id number] [facility] MD Progress Note  05/22/2013 2:03 PM Zachary [name]  MRN:  [id number]  Subjective:  Zachary [name] is out of bed and sitting up in a wheel chair due to lower extremities tremors and weakness. He reports, "I'm shaking a lot. I can't even hold a cup or spoon to feed myself because of the shakes. I feel weak and exhausted. Can hardly stand".  Diagnosis:   Axis I: Alcohol dependence Axis II: Deferred Axis III:  Past Medical History  Diagnosis Date  . Hypertension   . Coronary artery disease   . Hyperlipemia   . Angina   . Shortness of breath   . Mental disorder   . Arthritis   . COPD (chronic obstructive pulmonary disease)   . Hiatal hernia   . Depression   . Gout   . Joint pain   . Depression   . Anxiety     ADL's:  Impaired  Sleep: Good  Appetite:  Good  Suicidal Ideation:  Patient denies any thought, plan, or intent Homicidal Ideation:  Patient denies any thought, plan, or intent  AEB (as evidenced by):  Psychiatric Specialty Exam: Review of Systems  Constitutional: Negative.   HENT: Negative.   Eyes: Negative.   Respiratory: Positive for shortness of breath and wheezing.   Cardiovascular: Negative.   Gastrointestinal: Negative.   Genitourinary: Negative.   Musculoskeletal: Positive for myalgias.  Skin: Negative.   Neurological: Negative.   Endo/Heme/Allergies: Negative.   Psychiatric/Behavioral: Negative.     Blood pressure 137/74, pulse 62, temperature 98.7 F (37.1 C), temperature source Oral, resp. rate 16, height 5\' 6"  (1.676 m), weight 62.596 kg (138 lb).Body mass index is 22.28 kg/(m^2).  General Appearance: Disheveled  Eye Contact::  Poor   Speech:  Garbled  Volume:  Decreased  Mood:  Depressed,   Affect:  Congruent  Thought Process:  Fairy rational  Orientation:  Full (Time, Place, and Person)  Thought Content:  Denies hallucinations.  Suicidal Thoughts:  No  Homicidal Thoughts:  No  Memory:  Immediate;   Good Recent;   Good Remote;   Good  Judgement:  Fair  Insight:  Fair  Psychomotor Activity:  Decreased  Concentration:  Good  Recall:  Good  Akathisia:  No  Handed:  Right  AIMS (if indicated):     Assets:  Communication Skills  Sleep:  Number of Hours: 5   Current Medications: Current Facility-Administered Medications  Medication Dose Route Frequency Provider Last Rate Last Dose  . acetaminophen (TYLENOL) tablet 650 mg  650 mg Oral Q6H PRN Shuvon Rankin, NP   650 mg at 05/21/13 2033  . albuterol (PROVENTIL HFA;VENTOLIN HFA) 108 (90 BASE) MCG/ACT inhaler 2 puff  2 puff Inhalation Q4H PRN Sanjuana Kava, NP   2 puff at 05/21/13 2033  . alum & mag hydroxide-simeth (MAALOX/MYLANTA) 200-200-20 MG/5ML suspension 30 mL  30 mL Oral Q4H PRN Shuvon Rankin, NP   30 mL at 05/22/13 0913  . famotidine (PEPCID) tablet 20 mg  20 mg Oral BID Kerry Hough, PA-C   20 mg at 05/22/13 0749  . gabapentin (NEURONTIN) capsule 200 mg  200 mg Oral TID  Sanjuana Kava, NP   200 mg at 05/22/13 1207  . hydrOXYzine (ATARAX/VISTARIL) tablet 25 mg  25 mg Oral Q6H PRN Kerry Hough, PA-C   25 mg at 05/19/13 2038  . ipratropium (ATROVENT) nebulizer solution 0.5 mg  0.5 mg Nebulization Q6H PRN Sanjuana Kava, NP   0.5 mg at 05/21/13 2249  . magnesium hydroxide (MILK OF MAGNESIA) suspension 30 mL  30 mL Oral Daily PRN Shuvon Rankin, NP      . metoprolol tartrate (LOPRESSOR) tablet 25 mg  25 mg Oral BID Rachael Fee, MD   25 mg at 05/22/13 0749  . multivitamin with minerals tablet 1 tablet  1 tablet Oral Daily Shuvon Rankin, NP   1 tablet at 05/22/13 0749  . nicotine (NICODERM CQ - dosed in mg/24 hours) patch 21 mg  21 mg Transdermal Q0600 Rachael Fee, MD   21 mg at 05/22/13 0630  . thiamine (B-1) injection 100 mg  100 mg Intramuscular Once Shuvon Rankin, NP      . thiamine (VITAMIN B-1) tablet 100 mg  100 mg Oral Daily Shuvon Rankin, NP   100 mg at 05/22/13 0749  . traZODone (DESYREL) tablet 50 mg  50 mg Oral QHS PRN,MR X 1 Larena Sox, MD   50 mg at 05/20/13 2220    Lab Results:  No results found for this or any previous visit (from the past 48 hour(s)).  Physical Findings: AIMS: Facial and Oral Movements Muscles of Facial Expression: None, normal Lips and Perioral Area: None, normal Jaw: None, normal Tongue: None, normal,Extremity Movements Upper (arms, wrists, hands, fingers): None, normal Lower (legs, knees, ankles, toes): None, normal, Trunk Movements Neck, shoulders, hips: None, normal, Overall Severity Severity of abnormal movements (highest score from questions above): None, normal Incapacitation due to abnormal movements: None, normal Patient's awareness of abnormal movements (rate only patient's report): No Awareness, Dental Status Current problems with teeth and/or dentures?: No Does patient usually wear dentures?: No  CIWA:  CIWA-Ar Total: 1 COWS:     Treatment Plan Summary: Daily contact with patient to assess and evaluate symptoms and progress in treatment Medication management  Plan: Supportive approach/coping skills/relapse prevention. Wheel chair to assist with mobility. Continue breathing treatments for shortness of breath/wheezing. Will continue to monitor response to/adverse effects of medications in use to assure effectiveness. Continue to monitor mood, behavior and interaction with staff and other patients. Continue current plan of care.  Medical Decision Making Problem Points:  Established problem, stable/improving (1) and Review of psycho-social stressors (1) Data Points:  Review or order clinical lab tests (1) Review of medication regiment & side effects (2)  I certify that inpatient  services furnished can reasonably be expected to improve the patient's condition.   Armandina Stammer I, PMHNP-BC 05/22/2013, 2:03 PM

## 2013-05-22 NOTE — Progress Notes (Signed)
D: Pt was able to verbalize that he was in a treatment facility. Pt responds appropriately to questions, but takes time to process.  A: Pt was observed having deep respirations during his light sleep. This concerned writer gently awaken this pt to offer him his inhaler or nebulizer treatment. This pt opted for his inhaler at this time. Relief was achieved. At bedtime this writer administered his nebulizer treatment for his SOB. Along with the Tylenol given for his shoulder pain and chest pain, this writer administered heat packs to soothe his shoulder pain at bedtime. The chest pain was reported to the PA. Pt's recent cardiac work prior to admission was reviewed. Pepcid was ordered for this pt to rule out any GI concerns. After administration this pt reported relief. He originally report his chest pain as being a 5/10 and that this was usual for him. Writer offered and administered Gatorade to pt to provide hydration. Writer offered to change pt as needed and he responded that he wanted to wait until his bath on Thursday. He declined at this time. Continued support and availability as needed was extended to this pt.  R: Pt's Prn was effective and provide relief. Pt received a bath at 0530 after an incontinent episode. Bed lines changed. With purple wipes used on mattress. This pt remains safe at this time.

## 2013-05-22 NOTE — Progress Notes (Signed)
  D) Patient pleasant and cooperative upon my assessment. Patient completed Patient Self Inventory with assistance from MHT, reports slept "well," and  appetite is "improving." Patient rates depression as   5/10, patient rates hopeless feelings as  5/10. Patient denies SI/HI, denies A/V hallucinations.   A) Patient offered support and encouragement, patient encouraged to discuss feelings/concerns with staff. Patient verbalized understanding. Patient monitored Q15 minutes for safety. Patient met with MD  to discuss today's goals and plan of care. Patient reminded of increased risk for falls, patient verbalizes understanding of patient safety plan. Patient continues to have episodes of incontinence, often wetting the floor. Patient encouraged to call for help before attempting to stand/ambulate, patient verbalizes understanding.   R) Patient visible in milieu, attending some groups in day room and meals in dining room. Patient appropriate with staff and peers.   Patient taking medications as ordered. Patient insightful with a plan to "quit drinking, pay more attention to grandkids." Will continue to monitor.

## 2013-05-22 NOTE — Progress Notes (Signed)
Recreation Therapy Notes  Date: 06.19.2014 Time: 3:00pm Location: [address] Dayroom      Group Topic/Focus: Communication, Problem Solving, Team Work  Participation Level: Did not attend.  Monalisa Bayless L Zuri Bradway, LRT/CTRS  Hindy Perrault L 05/22/2013 4:08 PM 

## 2013-05-22 NOTE — Progress Notes (Signed)
Adult Psychoeducational Group Note  Date:  05/22/2013 Time:  2:07 PM  Group Topic/Focus:  Overcoming Stress:   The focus of this group is to define stress and help patients assess their triggers.  Participation Level:  Active  Participation Quality:  Drowsy, Sharing and Supportive  Affect:  Lethargic  Cognitive:  Disorganized  Insight: Limited  Engagement in Group:  Limited  Modes of Intervention:  Discussion, Education and Support  Additional Comments:  Zachary [name] was active in group. Zachary [name] had moments when he was insightful and on topic and other moments when what he was saying was not connected to the topic at hand.   Nichola Sizer 05/22/2013, 2:07 PM

## 2013-05-22 NOTE — BHH Group Notes (Signed)
[facility] LCSW Group Therapy  05/22/2013 3:13 PM  Type of Therapy:  Group Therapy  Participation Level:  Did Not Attend  Smart, Seleena Reimers 05/22/2013, 3:13 PM

## 2013-05-22 NOTE — [facility] Group Notes (Signed)
[facility] LCSW Aftercare Discharge Planning Group Note   05/22/2013 10:06 AM  Participation Quality:  DID NOT ATTEND   Smart, Lebron Quam

## 2013-05-22 NOTE — Progress Notes (Signed)
D   Pt has been in bed asleep most of the past 4 hours   He did stir a little when i went in his room  but he did not answer questions  He is a high fall risk and has a weak unsteady gait and has been incontinent A   Verbal support offered  Pt on 1:1 R   Pt safe at present

## 2013-05-23 MED ORDER — NAPHAZOLINE HCL 0.1 % OP SOLN
1.0000 [drp] | Freq: Four times a day (QID) | OPHTHALMIC | Status: DC | PRN
  Administered 2013-05-23 – 2013-05-26 (×4): 1 [drp] via OPHTHALMIC
  Filled 2013-05-23: qty 15

## 2013-05-23 MED ORDER — GABAPENTIN 100 MG PO CAPS
100.0000 mg | ORAL_CAPSULE | Freq: Three times a day (TID) | ORAL | Status: DC
  Administered 2013-05-23 – 2013-05-26 (×9): 100 mg via ORAL
  Filled 2013-05-23 (×2): qty 1
  Filled 2013-05-23: qty 12
  Filled 2013-05-23: qty 1
  Filled 2013-05-23: qty 12
  Filled 2013-05-23 (×5): qty 1
  Filled 2013-05-23: qty 12
  Filled 2013-05-23 (×3): qty 1

## 2013-05-23 NOTE — Progress Notes (Signed)
Adult Psychoeducational Group Note  Date:  05/23/2013 Time:  8:00PM Group Topic/Focus:  Wrap-Up Group:   The focus of this group is to help patients review their daily goal of treatment and discuss progress on daily workbooks.  Participation Level:  Did Not Attend    Additional Comments:  Pt. Didn't attend group.   Bing Plume D 05/23/2013, 8:44 PM

## 2013-05-23 NOTE — Progress Notes (Signed)
Patient ID: Zachary [name], male   DOB: [birth year], 72 y.o.   MRN: [id number] Patient ID: Zachary [name], male   DOB: 03/04/1941, 72 y.o.   MRN: [id number] Patient ID: Zachary [name], male   DOB: 1941-03-08, 72 y.o.   MRN: [id number] Patient ID: Zachary [name], male   DOB: March 23, 1941, 72 y.o.   MRN: [id number] Patient ID: Zachary [name], male   DOB: Oct 09, 1941, 72 y.o.   MRN: [id number] [facility] MD Progress Note  05/23/2013 3:18 PM Zachary [name]  MRN:  [id number]  Subjective:  Zachary [name] reports, "I'm feeling too drowsy and shaky today. My eye feels scratchy and dry. I'm still having the shakes to my hands and legs. I ain't feeling good. My appetite is not good. I don't like the food here".  O: Currently on 1:1 supervision due to weakness. Requiring assist with adls.  Diagnosis:   Axis I: Alcohol dependence Axis II: Deferred Axis III:  Past Medical History  Diagnosis Date  . Hypertension   . Coronary artery disease   . Hyperlipemia   . Angina   . Shortness of breath   . Mental disorder   . Arthritis   . COPD (chronic obstructive pulmonary disease)   . Hiatal hernia   . Depression   . Gout   . Joint pain   . Depression   . Anxiety     ADL's:  Impaired  Sleep: Good  Appetite:  Good  Suicidal Ideation:  Patient denies any thought, plan, or intent Homicidal Ideation:  Patient denies any thought, plan, or intent  AEB (as evidenced by):  Psychiatric Specialty Exam: Review of Systems  Constitutional: Negative.   HENT: Negative.   Eyes: Negative.   Respiratory: Positive for shortness of breath and wheezing.   Cardiovascular: Negative.   Gastrointestinal: Negative.   Genitourinary: Negative.   Musculoskeletal: Positive for myalgias.  Skin: Negative.   Neurological: Negative.   Endo/Heme/Allergies: Negative.   Psychiatric/Behavioral: Negative.     Blood pressure 154/91, pulse 64, temperature 98.5 F (36.9 C), temperature source Oral, resp. rate 16, height 5\' 6"  (1.676  m), weight 62.596 kg (138 lb).Body mass index is 22.28 kg/(m^2).  General Appearance: Disheveled  Eye Contact::  Poor  Speech:  Garbled  Volume:  Decreased  Mood:  Depressed,   Affect:  Congruent  Thought Process:  Fairy rational  Orientation:  Full (Time, Place, and Person)  Thought Content:  Denies hallucinations.  Suicidal Thoughts:  No  Homicidal Thoughts:  No  Memory:  Immediate;   Good Recent;   Good Remote;   Good  Judgement:  Fair  Insight:  Fair  Psychomotor Activity:  Decreased  Concentration:  Good  Recall:  Good  Akathisia:  No  Handed:  Right  AIMS (if indicated):     Assets:  Communication Skills  Sleep:  Number of Hours: 5.25   Current Medications: Current Facility-Administered Medications  Medication Dose Route Frequency Provider Last Rate Last Dose  . acetaminophen (TYLENOL) tablet 650 mg  650 mg Oral Q6H PRN Shuvon Rankin, NP   650 mg at 05/23/13 0827  . albuterol (PROVENTIL HFA;VENTOLIN HFA) 108 (90 BASE) MCG/ACT inhaler 2 puff  2 puff Inhalation Q4H PRN Sanjuana Kava, NP   2 puff at 05/21/13 2033  . alum & mag hydroxide-simeth (MAALOX/MYLANTA) 200-200-20 MG/5ML suspension 30 mL  30 mL Oral Q4H PRN Shuvon Rankin, NP   30 mL at 05/22/13 0913  . famotidine (PEPCID) tablet 20 mg  20 mg Oral BID Kerry Hough,  PA-C   20 mg at 05/23/13 0825  . gabapentin (NEURONTIN) capsule 200 mg  200 mg Oral TID Sanjuana Kava, NP   200 mg at 05/23/13 1205  . hydrOXYzine (ATARAX/VISTARIL) tablet 25 mg  25 mg Oral Q6H PRN Kerry Hough, PA-C   25 mg at 05/19/13 2038  . ipratropium (ATROVENT) nebulizer solution 0.5 mg  0.5 mg Nebulization Q6H PRN Sanjuana Kava, NP   0.5 mg at 05/21/13 2249  . magnesium hydroxide (MILK OF MAGNESIA) suspension 30 mL  30 mL Oral Daily PRN Shuvon Rankin, NP      . metoprolol tartrate (LOPRESSOR) tablet 25 mg  25 mg Oral BID Rachael Fee, MD   25 mg at 05/23/13 0824  . multivitamin with minerals tablet 1 tablet  1 tablet Oral Daily Shuvon Rankin, NP    1 tablet at 05/23/13 0825  . naphazoline (NAPHCON) 0.1 % ophthalmic solution 1 drop  1 drop Both Eyes QID PRN Sanjuana Kava, NP      . nicotine (NICODERM CQ - dosed in mg/24 hours) patch 21 mg  21 mg Transdermal Q0600 Rachael Fee, MD   21 mg at 05/23/13 4098  . thiamine (B-1) injection 100 mg  100 mg Intramuscular Once Shuvon Rankin, NP      . thiamine (VITAMIN B-1) tablet 100 mg  100 mg Oral Daily Shuvon Rankin, NP   100 mg at 05/23/13 0824  . traZODone (DESYREL) tablet 50 mg  50 mg Oral QHS PRN,MR X 1 Larena Sox, MD   50 mg at 05/20/13 2220    Lab Results:  No results found for this or any previous visit (from the past 48 hour(s)).  Physical Findings: AIMS: Facial and Oral Movements Muscles of Facial Expression: None, normal Lips and Perioral Area: None, normal Jaw: None, normal Tongue: None, normal,Extremity Movements Upper (arms, wrists, hands, fingers): None, normal Lower (legs, knees, ankles, toes): None, normal, Trunk Movements Neck, shoulders, hips: None, normal, Overall Severity Severity of abnormal movements (highest score from questions above): None, normal Incapacitation due to abnormal movements: None, normal Patient's awareness of abnormal movements (rate only patient's report): No Awareness, Dental Status Current problems with teeth and/or dentures?: No Does patient usually wear dentures?: No  CIWA:  CIWA-Ar Total: 1 COWS:     Treatment Plan Summary: Daily contact with patient to assess and evaluate symptoms and progress in treatment Medication management  Plan: Supportive approach/coping skills/relapse prevention. Continue 1:1 supervision for safety. Decreased neurontin from 200 mg tid to 100 mg tid. Visine eye drops to bilateral eyes for dryness. Obtain CMP in am Wheel chair to assist with mobility. Continue breathing treatments for shortness of breath/wheezing. Will continue to monitor response to/adverse effects of medications in use to assure  effectiveness. Continue to monitor mood, behavior and interaction with staff and other patients. Continue current plan of care.  Medical Decision Making Problem Points:  Established problem, stable/improving (1) and Review of psycho-social stressors (1) Data Points:  Review or order clinical lab tests (1) Review of medication regiment & side effects (2)  I certify that inpatient services furnished can reasonably be expected to improve the patient's condition.   Armandina Stammer I, PMHNP-BC 05/23/2013, 3:18 PM

## 2013-05-23 NOTE — Tx Team (Signed)
Interdisciplinary Treatment Plan Update (Adult)  Date: 05/23/2013   Time Reviewed: 10:54 AM  Progress in Treatment:  Attending groups: Yes  Participating in groups: Yes  Taking medication as prescribed: Yes  Tolerating medication: Yes  Family/Significant othe contact made: No  Patient understands diagnosis: Yes, AEB seeking treatment for detox and depressive symptoms.  Discussing patient identified problems/goals with staff: Yes  Medical problems stabilized or resolved: Yes  Denies suicidal/homicidal ideation: Yes  Patient has not harmed self or Others: Yes  New problem(s) identified: Pt now on 1:1 due to severe shakiness and high fall risk. Discharge Plan or Barriers: pt has [facility] date set for Thursday. He will follow up at [facility] for med management.   Additional comments: n/a Reason for Continuation of Hospitalization:  Medication managment  Withdrawal symptoms/medical issues (severe shakiness/unsteadiness) Estimated length of stay: 3-4 days (possible d/c Monday)  For review of initial/current patient goals, please see plan of care.  Attendees:  Patient:    Family:    Physician: Aggie PA  05/23/2013 10:55 AM   Nursing:    Clinical Social Worker [employer], LCSWA  05/23/2013 10:55 AM   Other:   Other:    Other:    Other:    Scribe for Treatment Team:  Trula Slade LCSWA 05/23/2013 10:55 AM

## 2013-05-23 NOTE — Progress Notes (Signed)
Adult Psychoeducational Group Note  Date:  05/23/2013 Time:  2:43 PM  Group Topic/Focus:  Therapeutic Activity  Participation Level:  Did Not Attend  Additional Comments:  Pt left dayroom when group started.   Dalia Heading 05/23/2013, 2:43 PM

## 2013-05-23 NOTE — Progress Notes (Signed)
Adult Psychoeducational Group Note  Date:  05/23/2013 Time:  11:00AM Group Topic/Focus:  Relapse Prevention Planning:   The focus of this group is to define relapse and discuss the need for planning to combat relapse.  Participation Level:  Did Not Attend   Additional Comments: Pt. Didn't attend group.   Bing Plume D 05/23/2013, 11:58 AM

## 2013-05-23 NOTE — [facility] Group Notes (Signed)
[facility] LCSW Group Therapy  05/23/2013 2:12 PM  Type of Therapy:  Group Therapy  Participation Level:  Did Not Attend  [name], Zachary Coopman 05/23/2013, 2:12 PM

## 2013-05-23 NOTE — Progress Notes (Signed)
1:1Note/DAR Note D:  Per pt self inventory pt reports sleeping poor, appetite improving, energy level low, ability to pay attention good, rates depression at a 2 out of 10 and hopelessness at a 7 out of 10. Denies SI/HI/AVH. Pt still high fall risk, requires assistance with mobility and ADL's.  Pt uses wheelchair for mobility.  Pt states that his eyes are dry and that he can't see far, wants eye drops.  A:  1:1 observation continued for safety, notified midlevel of pt s/s and request for eye drops, Emotional support provided, Encouraged pt to continue with treatment plan and attend all group activities, q15 min checks maintained for safety.  R:  Pt isolates in room, not attending groups, calm at this time

## 2013-05-23 NOTE — Progress Notes (Signed)
1:1 Note- Pt sleeping in bed, no distress, no complaints of pain, pt still high fall risk, 1:1 observation continued for safety.

## 2013-05-23 NOTE — Progress Notes (Signed)
Patient ID: Zachary [name], male   DOB: 28-May-1941, 72 y.o.   MRN: [id number] 1:1 notes  05/23/2013 @ 2200  D: Patient awake sitting in wheelchair eating in the dayroom. Pt stated he is "good well".  Pt denies any withdrawals and pain. Pt has a bath and bowel movement earlier tonight. No sign of distress noted at this time. A: 1:1 observation for safety R: Patient is safe. Sitter at side. 1:1 continues

## 2013-05-23 NOTE — Progress Notes (Signed)
Patient ID: Zachary [name], male   DOB: 1941/04/30, 72 y.o.   MRN: [id number] See paper chart for 1:1 notes

## 2013-05-23 NOTE — [facility] Group Notes (Signed)
[facility] LCSW Aftercare Discharge Planning Group Note   05/23/2013 9:35 AM  Participation Quality:  Appropriate  Mood/Affect:  Appropriate  Depression Rating:  3  Anxiety Rating:  3  Thoughts of Suicide:  No Will you contract for safety?   NA  Current AVH:  No  Plan for Discharge/Comments:  Pt has daymark date set for next Thursday and will follow up at [facility] for med management. He is still shaky and unstable. Pt put on 1:1 last night due to high fall risk. Pt will be staying through the weekend for observation. Pt also expressed concern that he cannot contact his friends/roomates and asked that CSW try to locate their phone numbers.   Transportation Means: unknown at this time/bus  Supports: roommates   Smart, Research scientist (physical sciences)

## 2013-05-23 NOTE — Progress Notes (Signed)
Patient ID: Zachary [name], male   DOB: 01-19-1941, 72 y.o.   MRN: [id number] 1800 hr. Nurse note  ---   Pt. Is friendly and having a good time interacting with staff.  He laughs and smiles and jokes about random things.   He is very plesant to talk with.  He shows no behavior issues and appears to be in good spirits.   Pt. Is up out of bed and sitting in day roomafter going to cafeteria to eat.  Pt. jokeingly said "the meat for dinner tasted like horse meat , or and old shoe".   He denies any pain out of the ordinary for him.   A   Support and safety cks and with 1:1 observations for pt. Safety as ordered.  Pt. Remains a high fall risk

## 2013-05-24 LAB — COMPREHENSIVE METABOLIC PANEL
ALT: 55 U/L — ABNORMAL HIGH (ref 0–53)
CO2: 31 mEq/L (ref 19–32)
Calcium: 9.1 mg/dL (ref 8.4–10.5)
Chloride: 105 mEq/L (ref 96–112)
GFR calc Af Amer: 81 mL/min — ABNORMAL LOW (ref >90)
GFR calc non Af Amer: 70 mL/min — ABNORMAL LOW (ref >90)
Glucose, Bld: 79 mg/dL (ref 70–99)
Sodium: 141 mEq/L (ref 135–145)
Total Bilirubin: 0.2 mg/dL — ABNORMAL LOW (ref 0.3–1.2)

## 2013-05-24 NOTE — Progress Notes (Addendum)
Patient ID: Zachary [name], male   DOB: Apr 23, 1941, 72 y.o.   MRN: [id number] 1:1 observation is ongoing to generalized weakness, unsteady gait and incontinence. Pt mood is depressed and his affect is flat. However, pt is alert and oriented x4 without any deficit in ability to follow instructions. Pt requires a wheelchair for ambulation. MHT is present at all times with the pt. Pt urinated on the floor of his bathroom even though there is a urinal available and accessable for him to use. Pt is attending groups and is safe. There are no hazards present in the patients environment. Writer will continue to monitor.

## 2013-05-24 NOTE — Progress Notes (Signed)
Patient ID: Zachary [name], male   DOB: [birth year], 72 y.o.   MRN: [id number] 1:1 observation is ongoing. Pt is alert and cooperative with staff and attending meals in the cafeteria. No hazardous items in pt environment, and pt is compliant with staff assistance. Pt continue to utilize wheelchair at all times. Writer will continue to monitor.

## 2013-05-24 NOTE — Progress Notes (Signed)
Patient did not attend the evening speaker AA meeting.  Pt remained in bed asleep during most of the meeting.

## 2013-05-24 NOTE — Progress Notes (Signed)
Patient ID: Zachary [name], male   DOB: [birth year], 72 y.o.   MRN: [id number] 1:1 notes  05/24/2013 @ 0200  D: Patient in bed sleeping. Respiration regular and unlabored. No sign of distress noted at this time A: 1:1 observation for safety R: Patient remains asleep. Sitter at bedside. 1:1 continues

## 2013-05-24 NOTE — [facility] Group Notes (Signed)
[facility] LCSW Group Therapy  05/24/2013   Type of Therapy:  Group Therapy 10 to 11 AM  Participation Level:  Minimal  Participation Quality:  Drowsy and Sharing  Affect:  Flat  Cognitive:  Oriented  Insight:  Lacking  Engagement in Therapy:  Lacking  Modes of Intervention:  Discussion, Education, Exploration, Rapport Building, Socialization and Support  Summary of Progress/Problems: Topic for group today was readiness for change and the six different stages (Precontemplation, Contemplation, Preparation, Action, Maintenance and Relapse) were then discussed and described by group. Zachary [name] came into group late and was somewhat distracting as he moved wheelchair around for 10-15 minutes. Patient was oriented to time and place yet confused on topic of discussion. When asked what stage of change he may be in currently patient shared about current living situation.   Clide Dales

## 2013-05-24 NOTE — Progress Notes (Signed)
1:1 Nursing  D.  Pt attempted to get to bathroom rather than use his urinal and urinated on floor as well as gown and socks.  A.  Myself and MHT changed Pt and assisted him to clean himself.  Pt tolerated well.  MHT cleaned floor with purple wipes.  Pt appeared confused asking where he is at.   R.  Pt reoriented to place, assisted in wheelchair, and taken to dayroom at his request.  Pt remains safe on unit, 1:1 continued as ordered for Pt safety.

## 2013-05-24 NOTE — Progress Notes (Signed)
Patient ID: Zachary [name], male   DOB: November 29, 1941, 72 y.o.   MRN: [id number] 1:1 observation is ongoing and MHT is monitoring pt at all times. Pt is asleep in bed at this time. There are no hazards present in his environment and he is cooperative with staff. He is transferring well to his wheelchair from bed and visa-versa with a one person assist. He is not attending most groups. Pt denies SI/HI and AVH and writes that he wants to "quit drinking." He rates his hopelessness at 6 and his helplessness at 6. Writer will continue to monitor.

## 2013-05-24 NOTE — Progress Notes (Signed)
Psychoeducational Group Note  Date:  05/24/2013 Time:  0945 am  Group Topic/Focus:  Identifying Needs:   The focus of this group is to help patients identify their personal needs that have been historically problematic and identify healthy behaviors to address their needs.   Positive thinking/attitude  Participation Level:  Did Not Attend   Andrena Mews 05/24/2013,10:24 AM

## 2013-05-24 NOTE — Progress Notes (Signed)
Patient ID: Rc Amison, male   DOB: 07/01/1941, 72 y.o.   MRN: [id number] 1:1 notes  05/24/2013 @ 0600  D: Patient in bed sleeping. Respiration regular and unlabored. No sign of distress noted at this time A: 1:1 observation for safety R: Patient remains asleep. Sitter at bedside. 1:1 continues

## 2013-05-24 NOTE — Progress Notes (Signed)
Adult Psychoeducational Group Note  Date:  05/24/2013 Time:  1315  Group Topic/Focus:  Recovery Goals:   The focus of this group is to identify appropriate goals for recovery and establish a plan to achieve them.  Participation Level:  Did Not Attend. Pt was asleep during group, and needed the rest.   Zachary [name] Shari Prows 05/24/2013, 6:29 PM

## 2013-05-24 NOTE — Progress Notes (Addendum)
Patient ID: Zachary [name], male   DOB: 1941/08/27, 72 y.o.   MRN: [id number] [facility] MD Progress Note  05/24/2013 10:56 PM Zachary [name]  MRN:  [id number]  Subjective:  Zachary [name] reports, "I'm feeling too drowsy and shaky today. My eye feels scratchy and dry. I'm still having the shakes to my hands and legs. I ain't feeling good. My appetite is not good. I don't like the food here".  O: Currently on 1:1 supervision due to weakness. Requiring assist with adls.  Diagnosis:   Axis I: Alcohol dependence Axis II: Deferred Axis III:  Past Medical History  Diagnosis Date  . Hypertension   . Coronary artery disease   . Hyperlipemia   . Angina   . Shortness of breath   . Mental disorder   . Arthritis   . COPD (chronic obstructive pulmonary disease)   . Hiatal hernia   . Depression   . Gout   . Joint pain   . Depression   . Anxiety     ADL's:  Impaired  Sleep: Good  Appetite:  Good  Suicidal Ideation:  Patient denies any thought, plan, or intent Homicidal Ideation:  Patient denies any thought, plan, or intent  AEB (as evidenced by):  Psychiatric Specialty Exam: Review of Systems  Constitutional: Negative.   HENT: Negative.   Eyes: Negative.   Respiratory: Positive for shortness of breath and wheezing.   Cardiovascular: Negative.   Gastrointestinal: Negative.   Genitourinary: Negative.   Musculoskeletal: Positive for myalgias.  Skin: Negative.   Neurological: Negative.   Endo/Heme/Allergies: Negative.   Psychiatric/Behavioral: Negative.     Blood pressure 121/77, pulse 54, temperature 98.5 F (36.9 C), temperature source Oral, resp. rate 16, height 5\' 6"  (1.676 m), weight 62.596 kg (138 lb).Body mass index is 22.28 kg/(m^2).  General Appearance: Disheveled  Eye Contact::  Poor  Speech:  Garbled  Volume:  Decreased  Mood:  Depressed,   Affect:  Congruent  Thought Process:  Fairy rational  Orientation:  Full (Time, Place, and Person)  Thought Content:  Denies  hallucinations.  Suicidal Thoughts:  No  Homicidal Thoughts:  No  Memory:  Immediate;   Good Recent;   Good Remote;   Good  Judgement:  Fair  Insight:  Fair  Psychomotor Activity:  Decreased  Concentration:  Good  Recall:  Good  Akathisia:  No  Handed:  Right  AIMS (if indicated):     Assets:  Communication Skills  Sleep:  Number of Hours: 5.25   Current Medications: Current Facility-Administered Medications  Medication Dose Route Frequency Provider Last Rate Last Dose  . acetaminophen (TYLENOL) tablet 650 mg  650 mg Oral Q6H PRN Shuvon Rankin, NP   650 mg at 05/24/13 0828  . albuterol (PROVENTIL HFA;VENTOLIN HFA) 108 (90 BASE) MCG/ACT inhaler 2 puff  2 puff Inhalation Q4H PRN Sanjuana Kava, NP   2 puff at 05/21/13 2033  . alum & mag hydroxide-simeth (MAALOX/MYLANTA) 200-200-20 MG/5ML suspension 30 mL  30 mL Oral Q4H PRN Shuvon Rankin, NP   30 mL at 05/22/13 0913  . famotidine (PEPCID) tablet 20 mg  20 mg Oral BID Kerry Hough, PA-C   20 mg at 05/24/13 1720  . gabapentin (NEURONTIN) capsule 100 mg  100 mg Oral TID Sanjuana Kava, NP   100 mg at 05/24/13 1720  . hydrOXYzine (ATARAX/VISTARIL) tablet 25 mg  25 mg Oral Q6H PRN Kerry Hough, PA-C   25 mg at 05/24/13 2222  . ipratropium (ATROVENT) nebulizer  solution 0.5 mg  0.5 mg Nebulization Q6H PRN Sanjuana Kava, NP   0.5 mg at 05/21/13 2249  . magnesium hydroxide (MILK OF MAGNESIA) suspension 30 mL  30 mL Oral Daily PRN Shuvon Rankin, NP      . metoprolol tartrate (LOPRESSOR) tablet 25 mg  25 mg Oral BID Rachael Fee, MD   25 mg at 05/24/13 1720  . multivitamin with minerals tablet 1 tablet  1 tablet Oral Daily Shuvon Rankin, NP   1 tablet at 05/24/13 0819  . naphazoline (NAPHCON) 0.1 % ophthalmic solution 1 drop  1 drop Both Eyes QID PRN Sanjuana Kava, NP   1 drop at 05/23/13 2241  . nicotine (NICODERM CQ - dosed in mg/24 hours) patch 21 mg  21 mg Transdermal Q0600 Rachael Fee, MD   21 mg at 05/24/13 0649  . thiamine (B-1)  injection 100 mg  100 mg Intramuscular Once Shuvon Rankin, NP      . thiamine (VITAMIN B-1) tablet 100 mg  100 mg Oral Daily Shuvon Rankin, NP   100 mg at 05/24/13 0819  . traZODone (DESYREL) tablet 50 mg  50 mg Oral QHS PRN,MR X 1 Larena Sox, MD   50 mg at 05/24/13 2222    Lab Results:  Results for orders placed during the hospital encounter of 05/15/13 (from the past 48 hour(s))  COMPREHENSIVE METABOLIC PANEL     Status: Abnormal   Collection Time    05/24/13  6:49 AM      Result Value Range   Sodium 141  135 - 145 mEq/L   Potassium 3.7  3.5 - 5.1 mEq/L   Chloride 105  96 - 112 mEq/L   CO2 31  19 - 32 mEq/L   Glucose, Bld 79  70 - 99 mg/dL   BUN 13  6 - 23 mg/dL   Creatinine, Ser 7.82  0.50 - 1.35 mg/dL   Calcium 9.1  8.4 - 95.6 mg/dL   Total Protein 6.5  6.0 - 8.3 g/dL   Albumin 2.3 (*) 3.5 - 5.2 g/dL   AST 63 (*) 0 - 37 U/L   ALT 55 (*) 0 - 53 U/L   Alkaline Phosphatase 82  39 - 117 U/L   Total Bilirubin 0.2 (*) 0.3 - 1.2 mg/dL   GFR calc non Af Amer 70 (*) >90 mL/min   GFR calc Af Amer 81 (*) >90 mL/min   Comment:            The eGFR has been calculated     using the CKD EPI equation.     This calculation has not been     validated in all clinical     situations.     eGFR's persistently     <90 mL/min signify     possible Chronic Kidney Disease.    Physical Findings: AIMS: Facial and Oral Movements Muscles of Facial Expression: None, normal Lips and Perioral Area: None, normal Jaw: None, normal Tongue: None, normal,Extremity Movements Upper (arms, wrists, hands, fingers): None, normal Lower (legs, knees, ankles, toes): None, normal, Trunk Movements Neck, shoulders, hips: None, normal, Overall Severity Severity of abnormal movements (highest score from questions above): None, normal Incapacitation due to abnormal movements: None, normal Patient's awareness of abnormal movements (rate only patient's report): No Awareness, Dental Status Current problems with  teeth and/or dentures?: No Does patient usually wear dentures?: No  CIWA:  CIWA-Ar Total: 1 COWS:     Treatment Plan Summary:  Daily contact with patient to assess and evaluate symptoms and progress in treatment Medication management  Plan: Supportive approach/coping skills/relapse prevention. Continue 1:1 supervision for safety. Decreased neurontin from 200 mg tid to 100 mg tid. Visine eye drops to bilateral eyes for dryness. Obtain CMP in am Wheel chair to assist with mobility. Continue breathing treatments for shortness of breath/wheezing. Will continue to monitor response to/adverse effects of medications in use to assure effectiveness. Continue to monitor mood, behavior and interaction with staff and other patients. Continue current plan of care.  Medical Decision Making Problem Points:  Established problem, stable/improving (1) and Review of psycho-social stressors (1) Data Points:  Review or order clinical lab tests (1) Review of medication regiment & side effects (2)  I certify that inpatient services furnished can reasonably be expected to improve the patient's condition.   MASHBURN,NEIL, PMHNP-BC 05/24/2013, 10:56 PM  Reviewed the information documented and agree with the treatment plan.  Yareni Creps,JANARDHAHA R. 05/26/2013 6:22 PM

## 2013-05-25 MED ORDER — IBUPROFEN 200 MG PO TABS: 400.0000 mg | ORAL_TABLET | Freq: Four times a day (QID) | ORAL | Status: DC | PRN

## 2013-05-25 NOTE — [facility] Group Notes (Addendum)
[facility] LCSW Group Therapy  05/25/2013  10:00 AM   Type of Therapy:  Group Therapy  Participation Level:  Did Not Attend  Raeanne Deschler Horton, LCSWA 05/25/2013 10:55 AM    

## 2013-05-25 NOTE — Progress Notes (Signed)
0600 Nursing note: Pt remains on 1:1 observations for high fall risk and weakness. Pt is sitting on the side of the bed drinking coffee. Pt remains safe on the unit. Will continue to monitor pt.

## 2013-05-25 NOTE — Progress Notes (Signed)
Patient did not attend the evening speaker AA meeting.  

## 2013-05-25 NOTE — Progress Notes (Signed)
Nursing 1:1 note- Patient is calm and cooperative. No physical complaints.  A- Attending recreation time with sitter. Support and encouragement given.  Continue current POC and 1:1 for safety.  R- Safety maintained.

## 2013-05-25 NOTE — Progress Notes (Signed)
Patient ID: Zachary [name], male   DOB: 09/21/1941, 72 y.o.   MRN: [id number] 1:1 observation is ongoing. MHT is monitoring pt at all times in order to prevent falls. Pt is somewhat weak and needs some assistance with movement in bed, but he is compliant with staff and alert. Pt is wheelchair fast but is active in the milieu. Writer will continue to monitor.  

## 2013-05-25 NOTE — Progress Notes (Signed)
Nursing 1:1 note- Patient is alert and oriented.   Compliant with scheduled medications and no prn medications requested.  Stated he has had "a good day".  Attending group with minimal participation.  Continue POC and 1:1 for safety.  Remains safe on the unit.

## 2013-05-25 NOTE — Progress Notes (Signed)
0200 Nursing note: Pt remains on 1:1 observations for high fall risk and weakness. Pt is resting in bed with eyes closed, breathing even and unlabored. Q15 min safety checks and 1:1 monitoring continued for safety. Pt remains safe on the unit. Will continue to monitor pt.

## 2013-05-25 NOTE — Progress Notes (Signed)
Patient ID: Zachary [name], male   DOB: Oct 17, 1941, 72 y.o.   MRN: [id number] Patient ID: Zachary [name], male   DOB: November 13, 1941, 72 y.o.   MRN: [id number] [facility] MD Progress Note  05/25/2013 2:10 PM Zachary [name]  MRN:  [id number]  Subjective:  Zachary [name] reports, "I'm feeling weak. I don't feel depressed. I need you guys to give me some picker-upper that will help me bounce back. I still shake. No longer craving cigarettes. Something for pain to my joints won't hurt either".  O: Currently on 1:1 supervision due to weakness. Requiring assist with adls.  Diagnosis:   Axis I: Alcohol dependence Axis II: Deferred Axis III:  Past Medical History  Diagnosis Date  . Hypertension   . Coronary artery disease   . Hyperlipemia   . Angina   . Shortness of breath   . Mental disorder   . Arthritis   . COPD (chronic obstructive pulmonary disease)   . Hiatal hernia   . Depression   . Gout   . Joint pain   . Depression   . Anxiety     ADL's:  Impaired  Sleep: Good  Appetite:  Good  Suicidal Ideation:  Patient denies any thought, plan, or intent Homicidal Ideation:  Patient denies any thought, plan, or intent  AEB (as evidenced by):  Psychiatric Specialty Exam: Review of Systems  HENT: Negative.   Eyes: Negative.   Respiratory: Negative for shortness of breath and wheezing.   Cardiovascular: Negative.   Gastrointestinal: Negative.   Genitourinary: Negative.   Musculoskeletal: Positive for myalgias.  Skin: Negative.   Neurological: Positive for weakness.  Endo/Heme/Allergies: Negative.   Psychiatric/Behavioral: Negative.     Blood pressure 110/66, pulse 43, temperature 98.1 F (36.7 C), temperature source Oral, resp. rate 18, height 5\' 6"  (1.676 m), weight 62.596 kg (138 lb).Body mass index is 22.28 kg/(m^2).  General Appearance: Disheveled  Eye Contact::  Poor  Speech:  Garbled  Volume:  Decreased  Mood:  Depressed,   Affect:  Congruent  Thought Process:  Fairy  rational  Orientation:  Full (Time, Place, and Person)  Thought Content:  Denies hallucinations.  Suicidal Thoughts:  No  Homicidal Thoughts:  No  Memory:  Immediate;   Good Recent;   Good Remote;   Good  Judgement:  Fair  Insight:  Fair  Psychomotor Activity:  Decreased  Concentration:  Good  Recall:  Good  Akathisia:  No  Handed:  Right  AIMS (if indicated):     Assets:  Communication Skills  Sleep:  Number of Hours: 5.75   Current Medications: Current Facility-Administered Medications  Medication Dose Route Frequency Provider Last Rate Last Dose  . acetaminophen (TYLENOL) tablet 650 mg  650 mg Oral Q6H PRN Shuvon Rankin, NP   650 mg at 05/25/13 0833  . albuterol (PROVENTIL HFA;VENTOLIN HFA) 108 (90 BASE) MCG/ACT inhaler 2 puff  2 puff Inhalation Q4H PRN Sanjuana Kava, NP   2 puff at 05/21/13 2033  . alum & mag hydroxide-simeth (MAALOX/MYLANTA) 200-200-20 MG/5ML suspension 30 mL  30 mL Oral Q4H PRN Shuvon Rankin, NP   30 mL at 05/22/13 0913  . famotidine (PEPCID) tablet 20 mg  20 mg Oral BID Kerry Hough, PA-C   20 mg at 05/25/13 4401  . gabapentin (NEURONTIN) capsule 100 mg  100 mg Oral TID Sanjuana Kava, NP   100 mg at 05/25/13 1155  . hydrOXYzine (ATARAX/VISTARIL) tablet 25 mg  25 mg Oral Q6H PRN Kerry Hough, PA-C  25 mg at 05/24/13 2222  . ipratropium (ATROVENT) nebulizer solution 0.5 mg  0.5 mg Nebulization Q6H PRN Sanjuana Kava, NP   0.5 mg at 05/21/13 2249  . magnesium hydroxide (MILK OF MAGNESIA) suspension 30 mL  30 mL Oral Daily PRN Shuvon Rankin, NP      . metoprolol tartrate (LOPRESSOR) tablet 25 mg  25 mg Oral BID Rachael Fee, MD   25 mg at 05/25/13 4132  . multivitamin with minerals tablet 1 tablet  1 tablet Oral Daily Shuvon Rankin, NP   1 tablet at 05/25/13 0832  . naphazoline (NAPHCON) 0.1 % ophthalmic solution 1 drop  1 drop Both Eyes QID PRN Sanjuana Kava, NP   1 drop at 05/25/13 1155  . nicotine (NICODERM CQ - dosed in mg/24 hours) patch 21 mg  21 mg  Transdermal Q0600 Rachael Fee, MD   21 mg at 05/25/13 0612  . thiamine (B-1) injection 100 mg  100 mg Intramuscular Once Shuvon Rankin, NP      . thiamine (VITAMIN B-1) tablet 100 mg  100 mg Oral Daily Shuvon Rankin, NP   100 mg at 05/25/13 4401  . traZODone (DESYREL) tablet 50 mg  50 mg Oral QHS PRN,MR X 1 Larena Sox, MD   50 mg at 05/24/13 2222    Lab Results:  Results for orders placed during the hospital encounter of 05/15/13 (from the past 48 hour(s))  COMPREHENSIVE METABOLIC PANEL     Status: Abnormal   Collection Time    05/24/13  6:49 AM      Result Value Range   Sodium 141  135 - 145 mEq/L   Potassium 3.7  3.5 - 5.1 mEq/L   Chloride 105  96 - 112 mEq/L   CO2 31  19 - 32 mEq/L   Glucose, Bld 79  70 - 99 mg/dL   BUN 13  6 - 23 mg/dL   Creatinine, Ser 0.27  0.50 - 1.35 mg/dL   Calcium 9.1  8.4 - 25.3 mg/dL   Total Protein 6.5  6.0 - 8.3 g/dL   Albumin 2.3 (*) 3.5 - 5.2 g/dL   AST 63 (*) 0 - 37 U/L   ALT 55 (*) 0 - 53 U/L   Alkaline Phosphatase 82  39 - 117 U/L   Total Bilirubin 0.2 (*) 0.3 - 1.2 mg/dL   GFR calc non Af Amer 70 (*) >90 mL/min   GFR calc Af Amer 81 (*) >90 mL/min   Comment:            The eGFR has been calculated     using the CKD EPI equation.     This calculation has not been     validated in all clinical     situations.     eGFR's persistently     <90 mL/min signify     possible Chronic Kidney Disease.    Physical Findings: AIMS: Facial and Oral Movements Muscles of Facial Expression: None, normal Lips and Perioral Area: None, normal Jaw: None, normal Tongue: None, normal,Extremity Movements Upper (arms, wrists, hands, fingers): None, normal Lower (legs, knees, ankles, toes): None, normal, Trunk Movements Neck, shoulders, hips: None, normal, Overall Severity Severity of abnormal movements (highest score from questions above): None, normal Incapacitation due to abnormal movements: None, normal Patient's awareness of abnormal movements  (rate only patient's report): No Awareness, Dental Status Current problems with teeth and/or dentures?: No Does patient usually wear dentures?: No  CIWA:  CIWA-Ar  Total: 1 COWS:     Treatment Plan Summary: Daily contact with patient to assess and evaluate symptoms and progress in treatment Medication management  Plan: Supportive approach/coping skills/relapse prevention. Ibuprofen 400 mg Q 6 hours for pain. Reviewed recent lab CMP lab values. Continue 1:1 supervision for safety. Continue Visine eye drops to bilateral eyes for dryness. Wheel chair to assist with mobility. Continue breathing treatments for shortness of breath/wheezing. Will continue to monitor response to/adverse effects of medications in use to assure effectiveness. Continue to monitor mood, behavior and interaction with staff and other patients. Continue current plan of care.  Medical Decision Making Problem Points:  Established problem, stable/improving (1) and Review of psycho-social stressors (1) Data Points:  Review or order clinical lab tests (1) Review of medication regiment & side effects (2)  I certify that inpatient services furnished can reasonably be expected to improve the patient's condition.   Armandina Stammer I, PMHNP-BC 05/25/2013, 2:10 PM

## 2013-05-25 NOTE — Progress Notes (Signed)
Patient ID: Zachary [name], male   DOB: [birth year], 72 y.o.   MRN: [id number] 1:1 observation is ongoing. MHT is monitoring pt at all times in order to prevent falls. Pt is somewhat weak and needs some assistance with movement in bed, but he is compliant with staff and alert. Pt is wheelchair fast but is active in the milieu. Writer will continue to monitor.

## 2013-05-25 NOTE — Progress Notes (Signed)
Psychoeducational Group Note  Date:  05/25/2013 Time:  0945 am  Group Topic/Focus:  Making Healthy Choices:   The focus of this group is to help patients identify negative/unhealthy choices they were using prior to admission and identify positive/healthier coping strategies to replace them upon discharge.  Participation Level:  Did Not Attend  Zackari Ruane J 05/25/2013, 10:29 AM 

## 2013-05-26 MED ORDER — TRAZODONE HCL 50 MG PO TABS: 50.0000 mg | ORAL_TABLET | Freq: Every evening | ORAL | Status: DC | PRN

## 2013-05-26 MED ORDER — HYDROXYZINE HCL 25 MG PO TABS: 25.0000 mg | ORAL_TABLET | Freq: Four times a day (QID) | ORAL | Status: DC | PRN

## 2013-05-26 MED ORDER — ALBUTEROL SULFATE HFA 108 (90 BASE) MCG/ACT IN AERS: 2.0000 | INHALATION_SPRAY | RESPIRATORY_TRACT | Status: DC | PRN

## 2013-05-26 MED ORDER — NAPHAZOLINE HCL 0.1 % OP SOLN: 1.0000 [drp] | Freq: Four times a day (QID) | OPHTHALMIC | Status: DC | PRN

## 2013-05-26 MED ORDER — GABAPENTIN 100 MG PO CAPS: 100.0000 mg | ORAL_CAPSULE | Freq: Three times a day (TID) | ORAL | Status: DC

## 2013-05-26 MED ORDER — METOPROLOL TARTRATE 25 MG PO TABS: 25.0000 mg | ORAL_TABLET | Freq: Two times a day (BID) | ORAL | Status: DC

## 2013-05-26 NOTE — Discharge Planning (Signed)
CSW received call from PT stating that patient was recommended for walker and wheelchair. UR/Case Manager Victorino Dike made aware of this and will work to get these items for pt prior to d/c today.

## 2013-05-26 NOTE — Discharge Summary (Signed)
Physician Discharge Summary Note  Patient:  Zachary [name] is an 72 y.o., male MRN:  [id number] DOB:  01-06-1941 Patient phone:  [phone] (home)  Patient address:   [address] [place] [address],   Date of Admission:  05/15/2013 Date of Discharge: 05/26/13  Reason for Admission:  Alcohol detox  Discharge Diagnoses: Active Problems:   Alcohol dependence  Review of Systems  Constitutional: Negative.   HENT: Negative.   Eyes: Negative.   Respiratory: Negative.   Cardiovascular: Negative.   Gastrointestinal: Negative.   Genitourinary: Negative.   Musculoskeletal: Negative.   Skin: Negative.   Neurological: Negative.   Endo/Heme/Allergies: Negative.   Psychiatric/Behavioral: Positive for substance abuse (Alcoholism). Negative for depression, suicidal ideas, hallucinations and memory loss. The patient is nervous/anxious (Stabilized with medication prior to discharge) and has insomnia (Stabilized with medication prior to discharge).    Axis Diagnosis:   AXIS I:  Alcohol dependence AXIS II:  Deferred AXIS III:   Past Medical History  Diagnosis Date  . Hypertension   . Coronary artery disease   . Hyperlipemia   . Angina   . Shortness of breath   . Mental disorder   . Arthritis   . COPD (chronic obstructive pulmonary disease)   . Hiatal hernia   . Depression   . Gout   . Joint pain   . Depression   . Anxiety    AXIS IV:  Alcoholism AXIS V:  63  Level of Care:  [facility]  Hospital Course:  Drinking too much. Drinking 40 ounces few a day. Has lost 27 pounds in a month. Drinking alcohol since he was 72 Y/O. "corn liquor" He has had 10 years sobriety. People get on his nerves. Has had "creepy" dreams because of the medication he takes. He was wanting to address his drinking as he could not stop on his own. He came for help.  After admission assessment and evaluation, it was determined that Mr. erbes will need detoxification treatment protocol to stabilize his systems  of alcohol intoxication and to combat the withdrawal symptoms of alcohol. And his discharge plans included a referral to a long term treatment facility [facility]) for a more intense substance abuse treatment. Mr. Prude was then started on Librium protocol for his alcohol detoxification. He was also enrolled in group counseling sessions and activities where he was taught and learned coping skills that should help him after discharge to cope better, manage his substance abuse problems to maintain a much longer sobriety. Patient was also observed to have lower extremity weakness and has much difficulty with standing and ambulation without an assistive device. He was provided with a wheel chair for mobility and maintained on 1:1 supervision for safety during the later part of his hospital stay.  Besides the detoxification protocol, patient also received Gabapentin 100 mg for anxiety, Trazodone 50 mg Q bedtime for sleep. He was also was enrolled and attended AA/NA meetings being offered and held on this unit. He has some previous and or identifiable medical conditions that required treatment and or monitoring while a patient in this hospital. He received medication management/monitoring for all those health issues as well. He was monitored closely for any potential problems that may arise as a result of result of and or during detoxification treatment. Patient tolerated his treatment regimen and detoxification treatment without any significant adverse effects and or reactions reported.   Patient attended treatment team meeting this am and met with the treatment team members. His reason for  admission, present symptoms, substance abuse issues, response to treatment and discharge plans discussed. Patient endorsed that he is doing well and stable for discharge to pursue the next phase of his substance abuse treatment. It was agreed upon that he will continue substance abuse treatment at the [facility] in [place], [place] on 05/29/13. He is instructed to report at the lobby on the morning of 0/26/14 at 08:00 am. And for medication management and routine psychiatric care, Mr. Youtz will follow-up care at the [facility] here in [place], [place]. He has been instructed and informed that this is a walk-in appointment between the hours of 08:00 - 09:00 am, Monday thru Friday. The addresses, dates, times and contact information for these clinic and treatment center provided for patient in writing.   Upon discharge, patient adamantly denies suicidal, homicidal ideations, auditory, visual hallucinations, delusional thinking and or withdrawal symptoms. Patient left [facility] with all personal belongings in no apparent distress. He received 4 days worth supply samples of his [facility] discharge medications. Transportation per bus, and bus fare/voucher provided by [facility]. He has an order for wheelchair and a walker to assist him with his mobility at home and during his stay at the [facility] treatment Center. These 2 durable medical equipments with provided and delivered by [facility].   Consults:  psychiatry, PT  Significant Diagnostic Studies:  labs: CBC with diff, CMP, UDS, Toxicology tests, U/A  Discharge Vitals:   Blood pressure 144/82, pulse 70, temperature 97.8 F (36.6 C), temperature source Oral, resp. rate 24, height 5\' 6"  (1.676 m), weight 62.596 kg (138 lb). Body mass index is 22.28 kg/(m^2). Lab Results:   Results for orders placed during the hospital encounter of 05/15/13 (from the past 72 hour(s))  COMPREHENSIVE METABOLIC PANEL     Status: Abnormal   Collection Time    05/24/13  6:49 AM      Result Value Range   Sodium 141  135 - 145 mEq/L   Potassium 3.7  3.5 - 5.1 mEq/L   Chloride 105  96 - 112 mEq/L   CO2 31  19 - 32 mEq/L   Glucose, Bld 79  70 - 99 mg/dL   BUN 13  6 - 23 mg/dL   Creatinine, Ser 4.54  0.50 - 1.35 mg/dL   Calcium 9.1  8.4 - 09.8 mg/dL    Total Protein 6.5  6.0 - 8.3 g/dL   Albumin 2.3 (*) 3.5 - 5.2 g/dL   AST 63 (*) 0 - 37 U/L   ALT 55 (*) 0 - 53 U/L   Alkaline Phosphatase 82  39 - 117 U/L   Total Bilirubin 0.2 (*) 0.3 - 1.2 mg/dL   GFR calc non Af Amer 70 (*) >90 mL/min   GFR calc Af Amer 81 (*) >90 mL/min   Comment:            The eGFR has been calculated     using the CKD EPI equation.     This calculation has not been     validated in all clinical     situations.     eGFR's persistently     <90 mL/min signify     possible Chronic Kidney Disease.    Physical Findings: AIMS: Facial and Oral Movements Muscles of Facial Expression: None, normal Lips and Perioral Area: None, normal Jaw: None, normal Tongue: None, normal,Extremity Movements Upper (arms, wrists, hands, fingers): None, normal Lower (legs, knees, ankles, toes): None, normal, Trunk  Movements Neck, shoulders, hips: None, normal, Overall Severity Severity of abnormal movements (highest score from questions above): None, normal Incapacitation due to abnormal movements: None, normal Patient's awareness of abnormal movements (rate only patient's report): No Awareness, Dental Status Current problems with teeth and/or dentures?: No Does patient usually wear dentures?: No  CIWA:  CIWA-Ar Total: 1 COWS:     Psychiatric Specialty Exam: See Psychiatric Specialty Exam and Suicide Risk Assessment completed by Attending Physician prior to discharge.  Discharge destination:  Home  Is patient on multiple antipsychotic therapies at discharge:  No   Has Patient had three or more failed trials of antipsychotic monotherapy by history:  No  Recommended Plan for Multiple Antipsychotic Therapies: NA     Medication List    TAKE these medications     Indication   albuterol 108 (90 BASE) MCG/ACT inhaler  Commonly known as:  PROVENTIL HFA;VENTOLIN HFA  Inhale 2 puffs into the lungs every 4 (four) hours as needed for wheezing or shortness of breath.    Indication:  Asthma     gabapentin 100 MG capsule  Commonly known as:  NEURONTIN  Take 1 capsule (100 mg total) by mouth 3 (three) times daily. For anxiety   Indication:  Agitation, Anxiety     hydrOXYzine 25 MG tablet  Commonly known as:  ATARAX/VISTARIL  Take 1 tablet (25 mg total) by mouth every 6 (six) hours as needed for anxiety.   Indication:  Anxiety associated with Organic Disease     metoprolol tartrate 25 MG tablet  Commonly known as:  LOPRESSOR  Take 1 tablet (25 mg total) by mouth 2 (two) times daily. For hypertension   Indication:  High Blood Pressure     traZODone 50 MG tablet  Commonly known as:  DESYREL  Take 1 tablet (50 mg total) by mouth at bedtime as needed and may repeat dose one time if needed for sleep.   Indication:  Trouble Sleeping       Follow-up Information   Follow up with [facility] On 05/29/2013. (Arrive by 8AM for admission.)    Contact information:   [address] [place], [place] [address] phone: [phone] fax: [phone]      Follow up with [facility] . (Walk in between 8am-9am Monday through Friday for hospital followup/medication management)    Contact information:   [address], [place] [address] phone: [phone] fax: [phone]     Follow-up recommendations:  Activity:  As tolerated Diet: As recommended by your primary care doctor. Keep all scheduled follow-up appointments as recommended.  Comments:  Take all your medications as prescribed by your mental healthcare provider. Report any adverse effects and or reactions from your medicines to your outpatient provider promptly. Patient is instructed and cautioned to not engage in alcohol and or illegal drug use while on prescription medicines. In the event of worsening symptoms, patient is instructed to call the crisis hotline, 911 and or go to the nearest ED for appropriate evaluation and treatment of symptoms. Follow-up with your primary care provider for  your other medical issues, concerns and or health care needs.   Total Discharge Time:  Greater than 30 minutes.  Signed: Sanjuana Kava, PMHNP-BC 05/26/2013, 4:45 PM  Patient was personally seen, case discussed in detail with physician extender. Reviewed the information documented and agree with the discharge treatment plan.  Chalyn Amescua,JANARDHAHA R. 05/26/2013 7:09 PM

## 2013-05-26 NOTE — Progress Notes (Signed)
[facility] Adult Case Management Discharge Plan :  Will you be returning to the same living situation after discharge: Yes,  with friends At discharge, do you have transportation home?:Yes,  friend/bus pass Do you have the ability to pay for your medications:Yes,  insurance  Release of information consent forms completed and in the chart;  Patient's signature needed at discharge.  Patient to Follow up at: Follow-up Information   Follow up with [facility] On 05/29/2013. (Arrive by 8AM for admission.)    Contact information:   [address] [place], [place] [address] phone: [phone] fax: [phone]      Follow up with [facility] . (Walk in between 8am-9am Monday through Friday for hospital followup/medication management)    Contact information:   [address], [place] [address] phone: [phone] fax: [phone]      Patient denies SI/HI:   Yes,  yes    Safety Planning and Suicide Prevention discussed:  Yes,  pt did not endorse SI during admission or while at [facility]. SPE not required.   Smart, Elisabeth Strom 05/26/2013, 11:47 AM

## 2013-05-26 NOTE — Progress Notes (Signed)
Discharge instructions/medications/follow up appointments discussed with pt. P.t. evaluated pt received order for rolling walker and wheel chair , walker given to pt prior to discharged. Pt. Will received w/c at home.  Prescriptions given, samples given, and patients belongings from locker 34  returned to pt.   Pt verbalizes understanding. Pt transported via taxi to friends house  Pt denies SI/HI/AVH.

## 2013-05-26 NOTE — Discharge Planning (Signed)
[employer] called for [employer]. Flat fee of $18.00 Taxi Voucher completed. CSW verified with Victorino Dike in CM that pt's wheelchair/walker will be arriving in the next few minutes.

## 2013-05-26 NOTE — Progress Notes (Signed)
Physical Therapy Treatment Patient Details Name: Zachary [name] MRN: [id number] DOB: 07-29-1941 Today's Date: 05/26/2013 Time: 4098-1191 PT Time Calculation (min): 20 min  PT Assessment / Plan / Recommendation Comments on Treatment Session  Mr. Behan ambulated 17' with RW with supervision without Loss of balance. Recommend RW for home, WC for long distances. Encouraged pt to have stand by assist for going up/down steps to enter home.     Follow Up Recommendations  No PT follow up     Does the patient have the potential to tolerate intense rehabilitation     Barriers to Discharge        Equipment Recommendations  Rolling walker with 5" wheels;Wheelchair (measurements PT)    Recommendations for Other Services    Frequency Min 2X/week   Plan Discharge plan remains appropriate;Frequency remains appropriate    Precautions / Restrictions Precautions Precautions: Fall Restrictions Weight Bearing Restrictions: No   Pertinent Vitals/Pain *pt reports shoulders are sore RN aware**    Mobility  Transfers Transfers: Sit to Stand;Stand to Sit;Stand Pivot Transfers Sit to Stand: 5: Supervision;With upper extremity assist;With armrests (from [facility]) Stand to Sit: 5: Supervision;With upper extremity assist;With armrests (to [facility]) Details for Transfer Assistance: Verbal cues for hand placement and safety Ambulation/Gait Ambulation/Gait Assistance: 5: Supervision Ambulation Distance (Feet): 60 Feet Assistive device: Rolling walker Gait Pattern: Step-to pattern Gait velocity: slow, but increased from last session General Gait Details: VCs for flexed neck, no LOB, pt able to turn 180* with RW without LOB, distance limited by shaking/fatigue    Exercises     PT Diagnosis:    PT Problem List:   PT Treatment Interventions:     PT Goals Acute Rehab PT Goals PT Goal Formulation: With patient Time For Goal Achievement: 06/03/13 Potential to Achieve Goals: Fair Pt will go Sit to  Supine/Side: Independently;with HOB 0 degrees Pt will go Sit to Stand: with supervision PT Goal: Sit to Stand - Progress: Met Pt will Ambulate: 51 - 150 feet;with supervision;with rolling walker PT Goal: Ambulate - Progress: Met Pt will Go Up / Down Stairs: 1-2 stairs;with supervision  Visit Information  Last PT Received On: 05/26/13 Assistance Needed: +1    Subjective Data  Subjective: They threw out my walker, crutches, wheelchair, and breathing machine.  Patient Stated Goal: none stated   Cognition  Cognition Arousal/Alertness: Awake/alert Behavior During Therapy: WFL for tasks assessed/performed Overall Cognitive Status: No family/caregiver present to determine baseline cognitive functioning (not oriented to month (stated its April), but knew the year)    Balance     End of Session PT - End of Session Equipment Utilized During Treatment: Gait belt Activity Tolerance: Patient tolerated treatment well Patient left: in chair;with nursing in room Nurse Communication: Mobility status   GP     Ralene Bathe Kistler 05/26/2013, 11:11 AM [phone]

## 2013-05-26 NOTE — BHH Group Notes (Signed)
[facility] LCSW Group Therapy  05/26/2013 2:02 PM  Type of Therapy:  Group Therapy  Participation Level:  Did Not Attend  Smart, Herbert Seta 05/26/2013, 2:02 PM

## 2013-05-26 NOTE — BHH Group Notes (Signed)
[facility] LCSW Aftercare Discharge Planning Group Note   05/26/2013 9:26 AM  Participation Quality:  DID NOT ATTEND    Smart, Lebron Quam

## 2013-05-26 NOTE — Progress Notes (Signed)
Adult Psychoeducational Group Note  Date:  05/26/2013 Time:  11:39 AM  Group Topic/Focus:  Wellness Toolbox:   The focus of this group is to discuss various aspects of wellness, balancing those aspects and exploring ways to increase the ability to experience wellness.  Patients will create a wellness toolbox for use upon discharge.  Participation Level:  Minimal  Participation Quality:  Drowsy  Affect:  Flat  Cognitive:  Appropriate  Insight: Improving  Engagement in Group:  Improving  Modes of Intervention:  Discussion  Additional Comments:  Pt was limited in his interactions during group. Pt appeared drowsy with eyes closed during group. Pt shared that getting well and not having a broke leg is what positive wellness looks like in his life.   Sharyn Lull 05/26/2013, 11:39 AM

## 2013-05-26 NOTE — Progress Notes (Signed)
0000 Nursing note: Pt remains on 1:1 observations for high fall risk and weakness. Pt is resting in bed with eyes closed, breathing even and unlabored. Q15 min safety checks and 1:1 monitoring continued for safety. Pt remains safe on the unit. Will continue to monitor pt.

## 2013-05-26 NOTE — BHH Suicide Risk Assessment (Signed)
Suicide Risk Assessment  Discharge Assessment     Demographic Factors:  Male, Adolescent or young adult, Low socioeconomic status, Living alone and Unemployed  Mental Status Per Nursing Assessment::   On Admission:  NA  Current Mental Status by Physician: Patient has been tired and weak with a decreased psychomotor activity. Patient has fine mood with constricted affect. His most recent onset ideation he does sometimes. There is no evidence of psychotic symptoms.  Loss Factors: Decline in physical health and Financial problems/change in socioeconomic status  Historical Factors: Impulsivity  Risk Reduction Factors:   Religious beliefs about death, Living with another person, especially a relative and Positive therapeutic relationship  Continued Clinical Symptoms:  Depression:   Anhedonia Recent sense of peace/wellbeing Severe Alcohol/Substance Abuse/Dependencies  Cognitive Features That Contribute To Risk:  Polarized thinking    Suicide Risk:  Minimal: No identifiable suicidal ideation.  Patients presenting with no risk factors but with morbid ruminations; may be classified as minimal risk based on the severity of the depressive symptoms  Discharge Diagnoses:   AXIS I:  Alcohol Abuse and Substance Induced Mood Disorder AXIS II:  Deferred AXIS III:   Past Medical History  Diagnosis Date  . Hypertension   . Coronary artery disease   . Hyperlipemia   . Angina   . Shortness of breath   . Mental disorder   . Arthritis   . COPD (chronic obstructive pulmonary disease)   . Hiatal hernia   . Depression   . Gout   . Joint pain   . Depression   . Anxiety    AXIS IV:  economic problems, educational problems, housing problems, occupational problems, other psychosocial or environmental problems and problems related to social environment AXIS V:  61-70 mild symptoms  Plan Of Care/Follow-up recommendations:  Activity:  As tolerated Diet:  Regular  Is patient on multiple  antipsychotic therapies at discharge:  No   Has Patient had three or more failed trials of antipsychotic monotherapy by history:  No  Recommended Plan for Multiple Antipsychotic Therapies: Not applicable  Kya Mayfield,JANARDHAHA R. 05/26/2013, 12:45 PM

## 2013-05-26 NOTE — Progress Notes (Signed)
D:  Per pt self inventory pt reports sleeping, appetite good, energy level fair, rates depression at 1 , rates hopelessness 1 at a, denies SI/HI/AVH.  A:  Support and encouragement provided, encouraged pt to attend all groups and activities, q15 minute checks continued for safety.  R:  Pt is on 1;1 R/T falls , working on discharge plans

## 2013-05-26 NOTE — Progress Notes (Signed)
0430 Nursing note: Pt remains on 1:1 observations for high fall risk and weakness. Pt is resting in bed with eyes closed, breathing even and unlabored. Q15 min safety checks and 1:1 monitoring continued for safety. Pt remains safe on the unit. Will continue to monitor pt.

## 2013-05-29 NOTE — Progress Notes (Signed)
Patient Discharge Instructions:  After Visit Summary (AVS):   Faxed to:  05/29/13 Discharge Summary Note:   Faxed to:  05/29/13 Psychiatric Admission Assessment Note:   Faxed to:  05/29/13 Suicide Risk Assessment - Discharge Assessment:   Faxed to:  05/29/13 Faxed/Sent to the Next Level Care provider:  05/29/13 [facility] @ [phone] Faxed to [facility] @ [phone]  Jerelene Redden, 05/29/2013, 1:31 PM

## 2013-05-31 ENCOUNTER — Encounter (HOSPITAL_BASED_OUTPATIENT_CLINIC_OR_DEPARTMENT_OTHER): Payer: Self-pay | Admitting: *Deleted

## 2013-05-31 ENCOUNTER — Emergency Department (HOSPITAL_BASED_OUTPATIENT_CLINIC_OR_DEPARTMENT_OTHER)
Admission: EM | Admit: 2013-05-31 | Discharge: 2013-05-31 | Disposition: A | Discharge status: Other Acute Inpatient Hospital | Payer: Medicare Other | Attending: Emergency Medicine | Admitting: Emergency Medicine

## 2013-05-31 ENCOUNTER — Emergency Department (HOSPITAL_BASED_OUTPATIENT_CLINIC_OR_DEPARTMENT_OTHER): Payer: Medicare Other

## 2013-05-31 DIAGNOSIS — W050XXA Fall from non-moving wheelchair, initial encounter: Secondary | ICD-10-CM | POA: Insufficient documentation

## 2013-05-31 DIAGNOSIS — Z8679 Personal history of other diseases of the circulatory system: Secondary | ICD-10-CM | POA: Insufficient documentation

## 2013-05-31 DIAGNOSIS — Z8719 Personal history of other diseases of the digestive system: Secondary | ICD-10-CM | POA: Insufficient documentation

## 2013-05-31 DIAGNOSIS — Z79899 Other long term (current) drug therapy: Secondary | ICD-10-CM | POA: Insufficient documentation

## 2013-05-31 DIAGNOSIS — S0003XA Contusion of scalp, initial encounter: Secondary | ICD-10-CM | POA: Insufficient documentation

## 2013-05-31 DIAGNOSIS — Z8639 Personal history of other endocrine, nutritional and metabolic disease: Secondary | ICD-10-CM | POA: Insufficient documentation

## 2013-05-31 DIAGNOSIS — S0083XA Contusion of other part of head, initial encounter: Secondary | ICD-10-CM

## 2013-05-31 DIAGNOSIS — F3289 Other specified depressive episodes: Secondary | ICD-10-CM | POA: Insufficient documentation

## 2013-05-31 DIAGNOSIS — W19XXXA Unspecified fall, initial encounter: Secondary | ICD-10-CM

## 2013-05-31 DIAGNOSIS — F411 Generalized anxiety disorder: Secondary | ICD-10-CM | POA: Insufficient documentation

## 2013-05-31 DIAGNOSIS — Z862 Personal history of diseases of the blood and blood-forming organs and certain disorders involving the immune mechanism: Secondary | ICD-10-CM | POA: Insufficient documentation

## 2013-05-31 DIAGNOSIS — F329 Major depressive disorder, single episode, unspecified: Secondary | ICD-10-CM | POA: Insufficient documentation

## 2013-05-31 DIAGNOSIS — J4489 Other specified chronic obstructive pulmonary disease: Secondary | ICD-10-CM | POA: Insufficient documentation

## 2013-05-31 DIAGNOSIS — I10 Essential (primary) hypertension: Secondary | ICD-10-CM | POA: Insufficient documentation

## 2013-05-31 DIAGNOSIS — Y9210 Unspecified residential institution as the place of occurrence of the external cause: Secondary | ICD-10-CM | POA: Insufficient documentation

## 2013-05-31 DIAGNOSIS — F17200 Nicotine dependence, unspecified, uncomplicated: Secondary | ICD-10-CM | POA: Insufficient documentation

## 2013-05-31 DIAGNOSIS — I2510 Atherosclerotic heart disease of native coronary artery without angina pectoris: Secondary | ICD-10-CM | POA: Insufficient documentation

## 2013-05-31 DIAGNOSIS — M129 Arthropathy, unspecified: Secondary | ICD-10-CM | POA: Insufficient documentation

## 2013-05-31 DIAGNOSIS — J449 Chronic obstructive pulmonary disease, unspecified: Secondary | ICD-10-CM | POA: Insufficient documentation

## 2013-05-31 DIAGNOSIS — Y9389 Activity, other specified: Secondary | ICD-10-CM | POA: Insufficient documentation

## 2013-05-31 MED ORDER — IBUPROFEN 800 MG PO TABS
800.0000 mg | ORAL_TABLET | Freq: Once | ORAL | Status: AC
  Administered 2013-05-31: 800 mg via ORAL
  Filled 2013-05-31 (×2): qty 1

## 2013-05-31 MED ORDER — ACETAMINOPHEN 325 MG PO TABS: 650.0000 mg | ORAL_TABLET | Freq: Once | ORAL | Status: DC

## 2013-05-31 NOTE — ED Provider Notes (Signed)
History    This chart was scribed for Hilario Quarry, MD by Quintella Reichert, ED scribe.  This patient was seen in room MH10/MH10 and the patient's care was started at 3:14 PM.   CSN: [id number]  Arrival date & time 05/31/13  1455    Chief Complaint  Patient presents with  . Fall    Patient is a 72 y.o. male presenting with fall. The history is provided by the patient. No language interpreter was used.  Fall This is a new problem. The current episode started 1 to 2 hours ago. Episode frequency: Occurred one time. Associated symptoms include headaches. Pertinent negatives include no chest pain, no abdominal pain and no shortness of breath. Nothing aggravates the symptoms. Nothing relieves the symptoms. He has tried nothing for the symptoms.    HPI Comments: Zachary [name] is a 73 y.o. male brought from [facility] to the Emergency Department complaining of a fall that occurred 30 minutes ago, with a subsequent head injury.  Pt states that he was getting into a wheelchair and the chair flipped onto one side and he fell out of it.  There was impact to his head but he denies LOC.  He now presents with a large bump on his head and a constant, moderate headache.  He denies visual disturbance, nausea, emesis, lightheadedness, or injury to any other area.  Pt uses a wheelchair occasionally due to intermittent leg weakness.  He has been at [facility] for 3-4 days.  He is at [facility] for alcohol abuse.  He was in the ed and behavioral health for an extended period of time prior to discharge to [facility].  He has used a wheelchair during this time.     Past Medical History  Diagnosis Date  . Hypertension   . Coronary artery disease   . Hyperlipemia   . Angina   . Shortness of breath   . Mental disorder   . Arthritis   . COPD (chronic obstructive pulmonary disease)   . Hiatal hernia   . Depression   . Gout   . Joint pain   . Depression   . Anxiety     Past Surgical History  Procedure  Laterality Date  . Abdominal surgery      History reviewed. No pertinent family history.   History  Substance Use Topics  . Smoking status: Current Every Day Smoker -- 1.00 packs/day for 15 years    Types: Cigarettes  . Smokeless tobacco: Former Neurosurgeon  . Alcohol Use: 7.2 oz/week    12 Cans of beer per week     Comment: pt heavy drinker, pt stets at lest 2-3 40oz per day    Review of Systems  Eyes: Negative for visual disturbance.  Respiratory: Negative for shortness of breath.   Cardiovascular: Negative for chest pain.  Gastrointestinal: Negative for nausea, vomiting and abdominal pain.  Neurological: Positive for headaches. Negative for seizures and light-headedness.  All other systems reviewed and are negative.    Allergies  Review of patient's allergies indicates no known allergies.  Home Medications   Current Outpatient Rx  Name  Route  Sig  Dispense  Refill  . albuterol (PROVENTIL HFA;VENTOLIN HFA) 108 (90 BASE) MCG/ACT inhaler   Inhalation   Inhale 2 puffs into the lungs every 4 (four) hours as needed for wheezing or shortness of breath.   1 each   0   . gabapentin (NEURONTIN) 100 MG capsule   Oral   Take 1 capsule (100 mg  total) by mouth 3 (three) times daily. For anxiety   90 capsule   0   . hydrOXYzine (ATARAX/VISTARIL) 25 MG tablet   Oral   Take 1 tablet (25 mg total) by mouth every 6 (six) hours as needed for anxiety.   120 tablet   0   . metoprolol tartrate (LOPRESSOR) 25 MG tablet   Oral   Take 1 tablet (25 mg total) by mouth 2 (two) times daily. For hypertension   60 tablet   2   . traZODone (DESYREL) 50 MG tablet   Oral   Take 1 tablet (50 mg total) by mouth at bedtime as needed and may repeat dose one time if needed for sleep.   60 tablet   0    BP 138/66  Pulse 56  Temp(Src) 97.7 F (36.5 C) (Oral)  Resp 20  Ht 5' 6.5" (1.689 m)  Wt 145 lb (65.772 kg)  BMI 23.06 kg/m2  SpO2 94%  Physical Exam  Nursing note and vitals  reviewed. Constitutional: He is oriented to person, place, and time. He appears well-developed and well-nourished.  HENT:  Right Ear: Tympanic membrane and external ear normal.  Left Ear: Tympanic membrane and external ear normal.  Nose: Nose normal. Right sinus exhibits no maxillary sinus tenderness and no frontal sinus tenderness. Left sinus exhibits no maxillary sinus tenderness and no frontal sinus tenderness.  Mouth/Throat: Oropharynx is clear and moist.  Head: Normocephalic. 4x7-cm hematoma to the left forehead, no laceration.  Some tenderness palpable around it.  No crepitus, no bony step-off. No tenderness or crepitance to orbital rim. Rest of face atraumatic  Eyes: Conjunctivae and EOM are normal. Pupils are equal, round, and reactive to light. Right eye exhibits no nystagmus. Left eye exhibits no nystagmus.  Neck: Normal range of motion. Neck supple.  Neck nontender  Cardiovascular: Normal rate, regular rhythm, normal heart sounds and intact distal pulses.   Heart rate 60 and regular  Pulmonary/Chest: Effort normal and breath sounds normal. No respiratory distress. He exhibits no tenderness.  No signs of trauma to chest wall  Abdominal: Soft. Bowel sounds are normal. He exhibits no distension and no mass. There is no tenderness.  Musculoskeletal: Normal range of motion. He exhibits no edema and no tenderness.  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 He has normal strength and normal reflexes. No sensory deficit. He displays a negative Romberg sign. GCS eye subscore is 4. GCS verbal subscore is 5. GCS motor subscore is 6.  Reflex Scores:      Tricep reflexes are 2+ on the right side and 2+ on the left side.      Bicep  reflexes are 2+ on the right side and 2+ on the left side.      Brachioradialis reflexes are 2+ on the right side and 2+ on the left side.      Patellar reflexes are 2+ on the right side and 2+ on the left side.      Achilles reflexes are 2+ on the right side and 2+ on the left side. Not ambulating secondary to history of wheelchair use Speech is normal without dysarthria, dysphasia, or aphasia. Muscle strength is 5/5 in bilateral shoulders, elbow flexor and extensors, wrist flexor  and extensors, and intrinsic hand muscles. 5/5 bilateral lower extremity hip flexors, extensors, knee flexors and extensors, and ankle dorsi and plantar flexors.    Skin: Skin is warm and dry. No rash noted.  Ecchymotic area on left forearm (chronic)  Psychiatric: He has a normal mood and affect. His behavior is normal. Judgment and thought content normal.    ED Course  Procedures (including critical care time)  DIAGNOSTIC STUDIES: Oxygen Saturation is 94% on room air, adequate by my interpretation.    COORDINATION OF CARE: 3:21 PM-Discussed treatment plan which includes head CT and pain medication with pt at bedside and pt agreed to plan.    Labs Reviewed - No data to display  Ct Head Wo Contrast  05/31/2013   *RADIOLOGY REPORT*  Clinical Data: Patient fell out of wheelchair  CT HEAD WITHOUT CONTRAST  Technique:  Contiguous axial images were obtained from the base of the skull through the vertex without contrast.  Comparison: 06/25/2012.  Findings: There is a focal scalp hematoma in the left frontal region.  There is moderate cortical volume loss. There is no evidence of acute intracranial parenchymal hemorrhage, subdural hematoma or evidence of infarction or other acute abnormalities. The white and gray matter compartments otherwise appear normal. The ventricles and extra-axial spaces are normal.  The bony calvarium is normal.  No acute changes in the brain base. The mastoid air cells and the temporal  mandibular joints are normal.  The orbits are normal.  There is mild mucoperiosteal thickening in the ethmoid and maxillary sinuses.  IMPRESSION: Left frontal scalp hematoma.  No acute trauma to the skull brain. No acute intracranial abnormalities.   Original Report Authenticated By: Sander Radon, M.D.    No diagnosis found.  MDM  I personally performed the services described in this documentation, which was scribed in my presence. The recorded information has been reviewed and considered.    Hilario Quarry, MD 05/31/13 [phone]

## 2013-05-31 NOTE — ED Notes (Signed)
Patient transported to CT 

## 2013-05-31 NOTE — ED Notes (Signed)
Called [facility] and spoke with [place], who states he is the person that dropped the patient off. Asked Romeo Apple if he could say why the patient was brought in and what the patients baseline mentality was prior to fall. After hearing him ask other staff members if he was able to tell me any information over the phone about the patient, he stated that the patient fell out of a wheelchair and hit his head, but "everything else was the same".

## 2013-05-31 NOTE — ED Notes (Signed)
Pt brought here by staff from [facility] and dropped off. ? Larey Seat out of wheelchair. Hematoma to head. Answers questions and follows commands.

## 2013-05-31 NOTE — ED Notes (Signed)
Spoke with Darl Pikes at front desk at [employer], sts they will send someone to come pick him up.

## 2013-06-22 ENCOUNTER — Inpatient Hospital Stay (HOSPITAL_COMMUNITY)
Admission: EM | Admit: 2013-06-22 | Discharge: 2013-06-28 | DRG: 184 | Disposition: A | Discharge status: Home Health | Payer: Medicare Other | Attending: Surgery | Admitting: Surgery

## 2013-06-22 ENCOUNTER — Encounter (HOSPITAL_COMMUNITY): Payer: Self-pay | Admitting: *Deleted

## 2013-06-22 ENCOUNTER — Emergency Department (HOSPITAL_COMMUNITY): Payer: Medicare Other

## 2013-06-22 DIAGNOSIS — J4489 Other specified chronic obstructive pulmonary disease: Secondary | ICD-10-CM | POA: Diagnosis present

## 2013-06-22 DIAGNOSIS — S2249XA Multiple fractures of ribs, unspecified side, initial encounter for closed fracture: Secondary | ICD-10-CM

## 2013-06-22 DIAGNOSIS — F329 Major depressive disorder, single episode, unspecified: Secondary | ICD-10-CM | POA: Diagnosis present

## 2013-06-22 DIAGNOSIS — F10239 Alcohol dependence with withdrawal, unspecified: Secondary | ICD-10-CM | POA: Diagnosis not present

## 2013-06-22 DIAGNOSIS — IMO0002 Reserved for concepts with insufficient information to code with codable children: Secondary | ICD-10-CM | POA: Diagnosis present

## 2013-06-22 DIAGNOSIS — F10939 Alcohol use, unspecified with withdrawal, unspecified: Secondary | ICD-10-CM | POA: Diagnosis not present

## 2013-06-22 DIAGNOSIS — Z79899 Other long term (current) drug therapy: Secondary | ICD-10-CM

## 2013-06-22 DIAGNOSIS — I2510 Atherosclerotic heart disease of native coronary artery without angina pectoris: Secondary | ICD-10-CM | POA: Diagnosis present

## 2013-06-22 DIAGNOSIS — W19XXXA Unspecified fall, initial encounter: Secondary | ICD-10-CM

## 2013-06-22 DIAGNOSIS — K449 Diaphragmatic hernia without obstruction or gangrene: Secondary | ICD-10-CM | POA: Diagnosis present

## 2013-06-22 DIAGNOSIS — E785 Hyperlipidemia, unspecified: Secondary | ICD-10-CM | POA: Diagnosis present

## 2013-06-22 DIAGNOSIS — F1020 Alcohol dependence, uncomplicated: Secondary | ICD-10-CM | POA: Diagnosis present

## 2013-06-22 DIAGNOSIS — F10220 Alcohol dependence with intoxication, uncomplicated: Secondary | ICD-10-CM

## 2013-06-22 DIAGNOSIS — S2242XA Multiple fractures of ribs, left side, initial encounter for closed fracture: Secondary | ICD-10-CM

## 2013-06-22 DIAGNOSIS — Y9301 Activity, walking, marching and hiking: Secondary | ICD-10-CM

## 2013-06-22 DIAGNOSIS — R5381 Other malaise: Secondary | ICD-10-CM | POA: Diagnosis present

## 2013-06-22 DIAGNOSIS — R0902 Hypoxemia: Secondary | ICD-10-CM | POA: Diagnosis present

## 2013-06-22 DIAGNOSIS — F17200 Nicotine dependence, unspecified, uncomplicated: Secondary | ICD-10-CM | POA: Diagnosis present

## 2013-06-22 DIAGNOSIS — F3289 Other specified depressive episodes: Secondary | ICD-10-CM | POA: Diagnosis present

## 2013-06-22 DIAGNOSIS — M109 Gout, unspecified: Secondary | ICD-10-CM | POA: Diagnosis present

## 2013-06-22 DIAGNOSIS — J449 Chronic obstructive pulmonary disease, unspecified: Secondary | ICD-10-CM | POA: Diagnosis present

## 2013-06-22 DIAGNOSIS — I10 Essential (primary) hypertension: Secondary | ICD-10-CM | POA: Diagnosis present

## 2013-06-22 DIAGNOSIS — F411 Generalized anxiety disorder: Secondary | ICD-10-CM | POA: Diagnosis present

## 2013-06-22 LAB — URINALYSIS, ROUTINE W REFLEX MICROSCOPIC
Bilirubin Urine: NEGATIVE
Glucose, UA: NEGATIVE mg/dL
Hgb urine dipstick: NEGATIVE
Protein, ur: NEGATIVE mg/dL
Urobilinogen, UA: 1 mg/dL (ref 0.0–1.0)
pH: 5 (ref 5.0–8.0)

## 2013-06-22 LAB — CBC
HCT: 41.7 % (ref 39.0–52.0)
HCT: 39.6 % (ref 39.0–52.0)
Hemoglobin: 13.5 g/dL (ref 13.0–17.0)
MCH: 32.1 pg (ref 26.0–34.0)
MCHC: 32.4 g/dL (ref 30.0–36.0)
MCHC: 34.6 g/dL (ref 30.0–36.0)
MCV: 99 fL (ref 78.0–100.0)
MCV: 97.8 fL (ref 78.0–100.0)
Platelets: 142 10*3/uL — ABNORMAL LOW (ref 150–400)
RBC: 4.21 MIL/uL — ABNORMAL LOW (ref 4.22–5.81)
RDW: 13.5 % (ref 11.5–15.5)

## 2013-06-22 LAB — SAMPLE TO BLOOD BANK

## 2013-06-22 LAB — COMPREHENSIVE METABOLIC PANEL
ALT: 54 U/L — ABNORMAL HIGH (ref 0–53)
AST: 110 U/L — ABNORMAL HIGH (ref 0–37)
Albumin: 3 g/dL — ABNORMAL LOW (ref 3.5–5.2)
CO2: 25 mEq/L (ref 19–32)
Calcium: 8.1 mg/dL — ABNORMAL LOW (ref 8.4–10.5)
Creatinine, Ser: 0.89 mg/dL (ref 0.50–1.35)
Sodium: 141 mEq/L (ref 135–145)
Total Protein: 7.2 g/dL (ref 6.0–8.3)

## 2013-06-22 LAB — POCT I-STAT, CHEM 8
BUN: 5 mg/dL — ABNORMAL LOW (ref 6–23)
Calcium, Ion: 1.02 mmol/L — ABNORMAL LOW (ref 1.13–1.30)
Chloride: 108 mEq/L (ref 96–112)
Creatinine, Ser: 1.3 mg/dL (ref 0.50–1.35)
Glucose, Bld: 63 mg/dL — ABNORMAL LOW (ref 70–99)

## 2013-06-22 LAB — PROTIME-INR: Prothrombin Time: 13.9 seconds (ref 11.6–15.2)

## 2013-06-22 LAB — MRSA PCR SCREENING: MRSA by PCR: NEGATIVE

## 2013-06-22 LAB — BASIC METABOLIC PANEL
BUN: 7 mg/dL (ref 6–23)
Creatinine, Ser: 0.78 mg/dL (ref 0.50–1.35)
GFR calc Af Amer: >90 mL/min (ref >90)
GFR calc non Af Amer: 89 mL/min — ABNORMAL LOW (ref >90)
Potassium: 4.4 mEq/L (ref 3.5–5.1)

## 2013-06-22 MED ORDER — LORAZEPAM 2 MG/ML IJ SOLN
1.0000 mg | Freq: Four times a day (QID) | INTRAMUSCULAR | Status: AC | PRN
  Administered 2013-06-22 – 2013-06-24 (×4): 1 mg via INTRAVENOUS
  Filled 2013-06-22 (×4): qty 1

## 2013-06-22 MED ORDER — FENTANYL CITRATE 0.05 MG/ML IJ SOLN
100.0000 ug | INTRAMUSCULAR | Status: DC | PRN
  Administered 2013-06-22 – 2013-06-23 (×12): 100 ug via INTRAVENOUS
  Filled 2013-06-22 (×13): qty 2

## 2013-06-22 MED ORDER — SODIUM CHLORIDE 0.9 % IV BOLUS (SEPSIS)
1000.0000 mL | Freq: Once | INTRAVENOUS | Status: AC
  Administered 2013-06-22: 1000 mL via INTRAVENOUS

## 2013-06-22 MED ORDER — MORPHINE SULFATE 4 MG/ML IJ SOLN: 4.0000 mg | INTRAMUSCULAR | Status: DC | PRN

## 2013-06-22 MED ORDER — ALBUTEROL SULFATE HFA 108 (90 BASE) MCG/ACT IN AERS
2.0000 | INHALATION_SPRAY | RESPIRATORY_TRACT | Status: DC | PRN
  Filled 2013-06-22: qty 6.7

## 2013-06-22 MED ORDER — ONDANSETRON HCL 4 MG/2ML IJ SOLN: 4.0000 mg | Freq: Once | INTRAMUSCULAR | Status: AC

## 2013-06-22 MED ORDER — FENTANYL CITRATE 0.05 MG/ML IJ SOLN
100.0000 ug | Freq: Once | INTRAMUSCULAR | Status: AC
  Administered 2013-06-22: 100 ug via INTRAVENOUS

## 2013-06-22 MED ORDER — ONDANSETRON HCL 4 MG/2ML IJ SOLN
INTRAMUSCULAR | Status: AC
  Administered 2013-06-22: 4 mg via INTRAVENOUS
  Filled 2013-06-22: qty 2

## 2013-06-22 MED ORDER — GABAPENTIN 100 MG PO CAPS
100.0000 mg | ORAL_CAPSULE | Freq: Three times a day (TID) | ORAL | Status: DC
  Administered 2013-06-22 – 2013-06-28 (×19): 100 mg via ORAL
  Filled 2013-06-22 (×21): qty 1

## 2013-06-22 MED ORDER — FENTANYL CITRATE 0.05 MG/ML IJ SOLN
INTRAMUSCULAR | Status: AC
  Administered 2013-06-22: 50 ug via INTRAVENOUS
  Filled 2013-06-22: qty 2

## 2013-06-22 MED ORDER — ONDANSETRON HCL 4 MG PO TABS: 4.0000 mg | ORAL_TABLET | Freq: Four times a day (QID) | ORAL | Status: DC | PRN

## 2013-06-22 MED ORDER — ALBUTEROL SULFATE (5 MG/ML) 0.5% IN NEBU
5.0000 mg | INHALATION_SOLUTION | Freq: Once | RESPIRATORY_TRACT | Status: AC
  Administered 2013-06-22: 5 mg via RESPIRATORY_TRACT
  Filled 2013-06-22: qty 1

## 2013-06-22 MED ORDER — IPRATROPIUM BROMIDE 0.02 % IN SOLN
0.5000 mg | Freq: Once | RESPIRATORY_TRACT | Status: AC
  Administered 2013-06-22: 0.5 mg via RESPIRATORY_TRACT
  Filled 2013-06-22: qty 2.5

## 2013-06-22 MED ORDER — PANTOPRAZOLE SODIUM 40 MG IV SOLR
40.0000 mg | Freq: Every day | INTRAVENOUS | Status: DC
  Filled 2013-06-22: qty 40

## 2013-06-22 MED ORDER — HYDROXYZINE HCL 25 MG PO TABS
25.0000 mg | ORAL_TABLET | Freq: Four times a day (QID) | ORAL | Status: DC | PRN
  Administered 2013-06-22 – 2013-06-27 (×3): 25 mg via ORAL
  Filled 2013-06-22 (×3): qty 1

## 2013-06-22 MED ORDER — FOLIC ACID 1 MG PO TABS
1.0000 mg | ORAL_TABLET | Freq: Every day | ORAL | Status: DC
  Administered 2013-06-22: 1 mg via ORAL
  Filled 2013-06-22: qty 1

## 2013-06-22 MED ORDER — FOLIC ACID 1 MG PO TABS
1.0000 mg | ORAL_TABLET | Freq: Every day | ORAL | Status: DC
  Administered 2013-06-23 – 2013-06-28 (×6): 1 mg via ORAL
  Filled 2013-06-22 (×6): qty 1

## 2013-06-22 MED ORDER — METOPROLOL TARTRATE 25 MG PO TABS
25.0000 mg | ORAL_TABLET | Freq: Two times a day (BID) | ORAL | Status: DC
  Administered 2013-06-22 – 2013-06-28 (×13): 25 mg via ORAL
  Filled 2013-06-22 (×14): qty 1

## 2013-06-22 MED ORDER — KCL IN DEXTROSE-NACL 20-5-0.9 MEQ/L-%-% IV SOLN
INTRAVENOUS | Status: DC
  Administered 2013-06-22 – 2013-06-23 (×2): via INTRAVENOUS
  Filled 2013-06-22 (×5): qty 1000

## 2013-06-22 MED ORDER — VITAMIN B-1 100 MG PO TABS
100.0000 mg | ORAL_TABLET | Freq: Every day | ORAL | Status: DC
  Administered 2013-06-23 – 2013-06-28 (×6): 100 mg via ORAL
  Filled 2013-06-22 (×6): qty 1

## 2013-06-22 MED ORDER — THIAMINE HCL 100 MG/ML IJ SOLN
100.0000 mg | Freq: Every day | INTRAMUSCULAR | Status: DC
  Filled 2013-06-22: qty 1

## 2013-06-22 MED ORDER — FENTANYL CITRATE 0.05 MG/ML IJ SOLN: 50.0000 ug | Freq: Once | INTRAMUSCULAR | Status: AC

## 2013-06-22 MED ORDER — PANTOPRAZOLE SODIUM 40 MG PO TBEC
40.0000 mg | DELAYED_RELEASE_TABLET | Freq: Every day | ORAL | Status: DC
  Administered 2013-06-22: 40 mg via ORAL
  Filled 2013-06-22: qty 1

## 2013-06-22 MED ORDER — TRAZODONE HCL 50 MG PO TABS
50.0000 mg | ORAL_TABLET | Freq: Every evening | ORAL | Status: DC | PRN
  Administered 2013-06-22 – 2013-06-28 (×5): 50 mg via ORAL
  Filled 2013-06-22 (×6): qty 1

## 2013-06-22 MED ORDER — LORAZEPAM 1 MG PO TABS
1.0000 mg | ORAL_TABLET | Freq: Four times a day (QID) | ORAL | Status: AC | PRN
  Administered 2013-06-22: 1 mg via ORAL
  Filled 2013-06-22: qty 1

## 2013-06-22 MED ORDER — ADULT MULTIVITAMIN W/MINERALS CH
1.0000 | ORAL_TABLET | Freq: Every day | ORAL | Status: DC
  Administered 2013-06-22: 1 via ORAL
  Filled 2013-06-22: qty 1

## 2013-06-22 MED ORDER — VITAMIN B-1 100 MG PO TABS
100.0000 mg | ORAL_TABLET | Freq: Every day | ORAL | Status: DC
  Administered 2013-06-22: 100 mg via ORAL
  Filled 2013-06-22 (×2): qty 1

## 2013-06-22 MED ORDER — ADULT MULTIVITAMIN W/MINERALS CH
1.0000 | ORAL_TABLET | Freq: Every day | ORAL | Status: DC
  Administered 2013-06-23 – 2013-06-28 (×6): 1 via ORAL
  Filled 2013-06-22 (×7): qty 1

## 2013-06-22 MED ORDER — ONDANSETRON HCL 4 MG/2ML IJ SOLN
4.0000 mg | Freq: Four times a day (QID) | INTRAMUSCULAR | Status: DC | PRN
  Administered 2013-06-22: 4 mg via INTRAVENOUS
  Filled 2013-06-22: qty 2

## 2013-06-22 NOTE — ED Notes (Signed)
Assisted Nurse Lawson Fiscal with repositioning pt. Pt was left resting in bed and stated that how we repositioned him he felt better. Communicated with pt if he needed anything to call for assistance.

## 2013-06-22 NOTE — ED Notes (Signed)
Patient was walking home from the store to purchase a 40oz and upon walking to his apartment he tripped over the concrete step and fell.  He states he landed on top of the 40oz beer bottle  C/o left rib pain

## 2013-06-22 NOTE — ED Provider Notes (Signed)
History    CSN: [id number] Arrival date & time 06/22/13  0053  First MD Initiated Contact with Patient 06/22/13 0132     Chief Complaint  Patient presents with  . Fall   (Consider location/radiation/quality/duration/timing/severity/associated sxs/prior Treatment) HPI This is a 72 year old male who was walking home and fell. He was cradling a 40 ounce beer under his left arm and states he fell onto this beer. He is complaining of moderate to severe pain in the left ribs. This occurred about 2 hours ago. EMS reports crepitus felt on the left chest wall with some paradoxical movement on inspiration. The patient states he feels dehydrated. EMS reports his breath smells of alcohol.  Past Medical History  Diagnosis Date  . Hypertension   . Coronary artery disease   . Hyperlipemia   . Angina   . Shortness of breath   . Mental disorder   . Arthritis   . COPD (chronic obstructive pulmonary disease)   . Hiatal hernia   . Depression   . Gout   . Joint pain   . Depression   . Anxiety    Past Surgical History  Procedure Laterality Date  . Abdominal surgery     No family history on file. History  Substance Use Topics  . Smoking status: Current Every Day Smoker -- 1.00 packs/day for 15 years    Types: Cigarettes  . Smokeless tobacco: Former Neurosurgeon  . Alcohol Use: 7.2 oz/week    12 Cans of beer per week     Comment: pt heavy drinker, pt stets at lest 2-3 40oz per day    Review of Systems  All other systems reviewed and are negative.    Allergies  Review of patient's allergies indicates no known allergies.  Home Medications   Current Outpatient Rx  Name  Route  Sig  Dispense  Refill  . albuterol (PROVENTIL HFA;VENTOLIN HFA) 108 (90 BASE) MCG/ACT inhaler   Inhalation   Inhale 2 puffs into the lungs every 4 (four) hours as needed for wheezing or shortness of breath.   1 each   0   . gabapentin (NEURONTIN) 100 MG capsule   Oral   Take 1 capsule (100 mg total) by mouth 3  (three) times daily. For anxiety   90 capsule   0   . hydrOXYzine (ATARAX/VISTARIL) 25 MG tablet   Oral   Take 1 tablet (25 mg total) by mouth every 6 (six) hours as needed for anxiety.   120 tablet   0   . metoprolol tartrate (LOPRESSOR) 25 MG tablet   Oral   Take 1 tablet (25 mg total) by mouth 2 (two) times daily. For hypertension   60 tablet   2   . traZODone (DESYREL) 50 MG tablet   Oral   Take 1 tablet (50 mg total) by mouth at bedtime as needed and may repeat dose one time if needed for sleep.   60 tablet   0    BP 132/71  Pulse 49  Temp(Src) 98.1 F (36.7 C) (Oral)  Resp 15  SpO2 98%  Physical Exam General: Well-developed, well-nourished male in no acute distress; appearance consistent with age of record HENT: normocephalic, atraumatic; breath smells of alcohol Eyes: pupils equal round and reactive to light; extraocular muscles intact Neck: supple Heart: regular rate and rhythm; no murmurs, rubs or gallops Lungs: clear to auscultation bilaterally Chest: Left lower rib tenderness with mild paradoxical movement but no frank flail segment palpated Abdomen: soft; abdominal  wall hernia; nontender; bowel sounds present Extremities: No deformity; full range of motion; pulses normal Neurologic: Awake, alert and oriented; motor function intact in all extremities and symmetric; no facial droop Skin: Warm and dry Psychiatric: Mildly agitated    ED Course  Procedures (including critical care time)   MDM  Nursing notes and vitals signs, including pulse oximetry, reviewed.  Summary of this visit's results, reviewed by myself:  Labs:  Results for orders placed during the hospital encounter of 06/22/13 (from the past 24 hour(s))  COMPREHENSIVE METABOLIC PANEL     Status: Abnormal   Collection Time    06/22/13  2:15 AM      Result Value Range   Sodium 141  135 - 145 mEq/L   Potassium 4.2  3.5 - 5.1 mEq/L   Chloride 104  96 - 112 mEq/L   CO2 25  19 - 32 mEq/L    Glucose, Bld 63 (*) 70 - 99 mg/dL   BUN 7  6 - 23 mg/dL   Creatinine, Ser 0.98  0.50 - 1.35 mg/dL   Calcium 8.1 (*) 8.4 - 10.5 mg/dL   Total Protein 7.2  6.0 - 8.3 g/dL   Albumin 3.0 (*) 3.5 - 5.2 g/dL   AST 119 (*) 0 - 37 U/L   ALT 54 (*) 0 - 53 U/L   Alkaline Phosphatase 90  39 - 117 U/L   Total Bilirubin 0.5  0.3 - 1.2 mg/dL   GFR calc non Af Amer 85 (*) >90 mL/min   GFR calc Af Amer >90  >90 mL/min  CBC     Status: Abnormal   Collection Time    06/22/13  2:15 AM      Result Value Range   WBC 10.4  4.0 - 10.5 K/uL   RBC 4.21 (*) 4.22 - 5.81 MIL/uL   Hemoglobin 13.5  13.0 - 17.0 g/dL   HCT 14.7  82.9 - 56.2 %   MCV 99.0  78.0 - 100.0 fL   MCH 32.1  26.0 - 34.0 pg   MCHC 32.4  30.0 - 36.0 g/dL   RDW 13.0  86.5 - 78.4 %   Platelets 168  150 - 400 K/uL  PROTIME-INR     Status: None   Collection Time    06/22/13  2:15 AM      Result Value Range   Prothrombin Time 13.9  11.6 - 15.2 seconds   INR 1.09  0.00 - 1.49  SAMPLE TO BLOOD BANK     Status: None   Collection Time    06/22/13  2:15 AM      Result Value Range   Blood Bank Specimen SAMPLE AVAILABLE FOR TESTING     Sample Expiration 06/23/2013    POCT I-STAT, CHEM 8     Status: Abnormal   Collection Time    06/22/13  2:26 AM      Result Value Range   Sodium 143  135 - 145 mEq/L   Potassium 4.2  3.5 - 5.1 mEq/L   Chloride 108  96 - 112 mEq/L   BUN 5 (*) 6 - 23 mg/dL   Creatinine, Ser 6.96  0.50 - 1.35 mg/dL   Glucose, Bld 63 (*) 70 - 99 mg/dL   Calcium, Ion 2.95 (*) 1.13 - 1.30 mmol/L   TCO2 23  0 - 100 mmol/L   Hemoglobin 16.0  13.0 - 17.0 g/dL   HCT 28.4  13.2 - 44.0 %    Imaging Studies:  Ct Chest Wo Contrast  06/22/2013   *RADIOLOGY REPORT*  Clinical Data: Status post fall; left rib pain.  Question of crepitus on the left side of the chest.  CT CHEST WITHOUT CONTRAST  Technique:  Multidetector CT imaging of the chest was performed following the standard protocol without IV contrast.  Comparison: Chest  radiograph performed 05/14/2013, and CTA of the chest performed 11/19/2012  Findings: There is suggestion of mild bronchiectasis within the right middle and lower lung lobes, with associated tiny nodular opacities seen in the periphery of the right lung.  This may reflect a mild atypical infectious process, more prominent than on the prior CTA.  No pulmonary parenchymal contusion is seen.  No pleural effusion or pneumothorax is identified.  Diffuse coronary artery calcifications are seen.  There is no evidence of venous hemorrhage.  The mediastinum is grossly unremarkable in appearance.  No mediastinal lymphadenopathy is seen.  No pericardial effusion is identified.  Scattered calcific atherosclerotic disease is noted along the proximal great vessels, with likely mild narrowing of the left subclavian artery.  The thyroid gland is unremarkable in appearance.  No axillary lymphadenopathy is seen.  There is no evidence of significant soft tissue injury along the chest wall.  The visualized portions of the liver and the spleen are grossly unremarkable in appearance.  Stones are seen at the base of the gallbladder; the visualized portions of the gallbladder are otherwise grossly unremarkable.  Scattered calcification is noted along the proximal abdominal aorta; a small 1.9 cm parapelvic cyst is seen at the interpole region of the left kidney.  The visualized portions of the pancreas and stomach are within normal limits.  Several small anterior abdominal wall hernias are noted at the upper abdomen, containing only fat and demonstrating mild soft tissue stranding.  There are mildly displaced fractures of the left posterolateral 6th through 10th ribs.  A remote left posterolateral 5th rib fracture is seen.  IMPRESSION:  1.  Mildly displaced fractures of the left posterolateral 6th through 10th ribs. 2.  No evidence of pneumothorax; no pulmonary parenchymal contusion seen.  3.  Suggestion of mild bronchiectasis within the  right middle and lower lung lobes, with associated tiny nodular opacities in the periphery of the right lung.  This may reflect a mild chronic atypical infectious process, more prominent than on the prior CTA. 4.  Diffuse coronary artery calcifications seen. 5.  Cholelithiasis; gallbladder otherwise unremarkable in appearance. 6.  Small left renal cyst noted. 7.  Several small anterior abdominal wall hernias noted at the upper abdomen, containing only fat and demonstrating mild soft tissue stranding.   Original Report Authenticated By: Tonia Ghent, M.D.   Dg Chest Portable 1 View  06/22/2013   *RADIOLOGY REPORT*  Clinical Data: Fall, rib pain  CHEST - 1 VIEW  Comparison:  05/14/2013  Findings: The heart size and mediastinal contours are within normal limits.  Both lungs are clear.  Detail obscured by portable technique and cardiac leads.  Remote fracture of the left posterior fifth rib is re-identified. Acute fractures of the left lateral sixth, seventh, eighth, and ninth ribs noted without pneumothorax.  IMPRESSION:  Acute fractures of the left sixth, seventh, eighth, and ninth ribs.   Original Report Authenticated By: Christiana Pellant, M.D.   EKG Interpretation:  Date & Time: 06/22/2013 1:38 AM  Rate: 92  Rhythm: normal sinus rhythm with PVCs  QRS Axis: normal  Intervals: normal  ST/T Wave abnormalities: normal  Conduction Disutrbances:none  Narrative Interpretation:  Old EKG Reviewed: none available  5:36 AM Patient has been stable with the normal lordosis needed. Given his history of COPD, his age and his use of alcohol we will have him admitted to the trauma service for further evaluation and treatment.        Hanley Seamen, MD 06/22/13 [phone]

## 2013-06-22 NOTE — ED Notes (Signed)
Encourage pt. To splint when coughing.

## 2013-06-22 NOTE — ED Notes (Signed)
DR. Mulpus at bedsideMD at bedside.

## 2013-06-22 NOTE — ED Notes (Signed)
Dr. Carolynne Edouard at bedside; Dr. Read Drivers made aware. Orders for admission.

## 2013-06-22 NOTE — H&P (Signed)
History   Zachary [name] is an 72 y.o. male.   Chief Complaint:  Chief Complaint  Patient presents with  . Fall  The pt is a 72 yo wm who went to grocery store last night to buy a 40oz beer. He drinks about 2 40'[name] a day. He fell walking out and the 40 bottle hit him in the left chest fracturing multiple ribs. He smokes. He complains of left chest pain  Fall Associated symptoms include chest pain.    Past Medical History  Diagnosis Date  . Hypertension   . Coronary artery disease   . Hyperlipemia   . Angina   . Shortness of breath   . Mental disorder   . Arthritis   . COPD (chronic obstructive pulmonary disease)   . Hiatal hernia   . Depression   . Gout   . Joint pain   . Depression   . Anxiety     Past Surgical History  Procedure Laterality Date  . Abdominal surgery      No family history on file. Social History:  reports that he has been smoking Cigarettes.  He has a 15 pack-year smoking history. He has quit using smokeless tobacco. He reports that he drinks about 7.2 ounces of alcohol per week. He reports that he does not use illicit drugs.  Allergies  No Known Allergies  Home Medications   (Not in a hospital admission)  Trauma Course   Results for orders placed during the hospital encounter of 06/22/13 (from the past 48 hour([name]))  COMPREHENSIVE METABOLIC PANEL     Status: Abnormal   Collection Time    06/22/13  2:15 AM      Result Value Range   Sodium 141  135 - 145 mEq/L   Potassium 4.2  3.5 - 5.1 mEq/L   Chloride 104  96 - 112 mEq/L   CO2 25  19 - 32 mEq/L   Glucose, Bld 63 (*) 70 - 99 mg/dL   BUN 7  6 - 23 mg/dL   Creatinine, Ser 4.09  0.50 - 1.35 mg/dL   Calcium 8.1 (*) 8.4 - 10.5 mg/dL   Total Protein 7.2  6.0 - 8.3 g/dL   Albumin 3.0 (*) 3.5 - 5.2 g/dL   AST 811 (*) 0 - 37 U/L   ALT 54 (*) 0 - 53 U/L   Alkaline Phosphatase 90  39 - 117 U/L   Total Bilirubin 0.5  0.3 - 1.2 mg/dL   GFR calc non Af Amer 85 (*) >90 mL/min   GFR calc Af Amer >90   >90 mL/min   Comment:            The eGFR has been calculated     using the CKD EPI equation.     This calculation has not been     validated in all clinical     situations.     eGFR'[name] persistently     <90 mL/min signify     possible Chronic Kidney Disease.  CBC     Status: Abnormal   Collection Time    06/22/13  2:15 AM      Result Value Range   WBC 10.4  4.0 - 10.5 K/uL   RBC 4.21 (*) 4.22 - 5.81 MIL/uL   Hemoglobin 13.5  13.0 - 17.0 g/dL   HCT 91.4  78.2 - 95.6 %   MCV 99.0  78.0 - 100.0 fL   MCH 32.1  26.0 - 34.0 pg  MCHC 32.4  30.0 - 36.0 g/dL   RDW 11.9  14.7 - 82.9 %   Platelets 168  150 - 400 K/uL  PROTIME-INR     Status: None   Collection Time    06/22/13  2:15 AM      Result Value Range   Prothrombin Time 13.9  11.6 - 15.2 seconds   INR 1.09  0.00 - 1.49  SAMPLE TO BLOOD BANK     Status: None   Collection Time    06/22/13  2:15 AM      Result Value Range   Blood Bank Specimen SAMPLE AVAILABLE FOR TESTING     Sample Expiration 06/23/2013    POCT I-STAT, CHEM 8     Status: Abnormal   Collection Time    06/22/13  2:26 AM      Result Value Range   Sodium 143  135 - 145 mEq/L   Potassium 4.2  3.5 - 5.1 mEq/L   Chloride 108  96 - 112 mEq/L   BUN 5 (*) 6 - 23 mg/dL   Creatinine, Ser 5.62  0.50 - 1.35 mg/dL   Glucose, Bld 63 (*) 70 - 99 mg/dL   Calcium, Ion 1.30 (*) 1.13 - 1.30 mmol/L   TCO2 23  0 - 100 mmol/L   Hemoglobin 16.0  13.0 - 17.0 g/dL   HCT 86.5  78.4 - 69.6 %  URINALYSIS, ROUTINE W REFLEX MICROSCOPIC     Status: Abnormal   Collection Time    06/22/13  6:19 AM      Result Value Range   Color, Urine YELLOW  YELLOW   APPearance CLEAR  CLEAR   Specific Gravity, Urine 1.013  1.005 - 1.030   pH 5.0  5.0 - 8.0   Glucose, UA NEGATIVE  NEGATIVE mg/dL   Hgb urine dipstick NEGATIVE  NEGATIVE   Bilirubin Urine NEGATIVE  NEGATIVE   Ketones, ur 15 (*) NEGATIVE mg/dL   Protein, ur NEGATIVE  NEGATIVE mg/dL   Urobilinogen, UA 1.0  0.0 - 1.0 mg/dL    Nitrite NEGATIVE  NEGATIVE   Leukocytes, UA NEGATIVE  NEGATIVE   Comment: MICROSCOPIC NOT DONE ON URINES WITH NEGATIVE PROTEIN, BLOOD, LEUKOCYTES, NITRITE, OR GLUCOSE <1000 mg/dL.   Ct Chest Wo Contrast  06/22/2013   *RADIOLOGY REPORT*  Clinical Data: Status post fall; left rib pain.  Question of crepitus on the left side of the chest.  CT CHEST WITHOUT CONTRAST  Technique:  Multidetector CT imaging of the chest was performed following the standard protocol without IV contrast.  Comparison: Chest radiograph performed 05/14/2013, and CTA of the chest performed 11/19/2012  Findings: There is suggestion of mild bronchiectasis within the right middle and lower lung lobes, with associated tiny nodular opacities seen in the periphery of the right lung.  This may reflect a mild atypical infectious process, more prominent than on the prior CTA.  No pulmonary parenchymal contusion is seen.  No pleural effusion or pneumothorax is identified.  Diffuse coronary artery calcifications are seen.  There is no evidence of venous hemorrhage.  The mediastinum is grossly unremarkable in appearance.  No mediastinal lymphadenopathy is seen.  No pericardial effusion is identified.  Scattered calcific atherosclerotic disease is noted along the proximal great vessels, with likely mild narrowing of the left subclavian artery.  The thyroid gland is unremarkable in appearance.  No axillary lymphadenopathy is seen.  There is no evidence of significant soft tissue injury along the chest wall.  The visualized portions  of the liver and the spleen are grossly unremarkable in appearance.  Stones are seen at the base of the gallbladder; the visualized portions of the gallbladder are otherwise grossly unremarkable.  Scattered calcification is noted along the proximal abdominal aorta; a small 1.9 cm parapelvic cyst is seen at the interpole region of the left kidney.  The visualized portions of the pancreas and stomach are within normal limits.   Several small anterior abdominal wall hernias are noted at the upper abdomen, containing only fat and demonstrating mild soft tissue stranding.  There are mildly displaced fractures of the left posterolateral 6th through 10th ribs.  A remote left posterolateral 5th rib fracture is seen.  IMPRESSION:  1.  Mildly displaced fractures of the left posterolateral 6th through 10th ribs. 2.  No evidence of pneumothorax; no pulmonary parenchymal contusion seen.  3.  Suggestion of mild bronchiectasis within the right middle and lower lung lobes, with associated tiny nodular opacities in the periphery of the right lung.  This may reflect a mild chronic atypical infectious process, more prominent than on the prior CTA. 4.  Diffuse coronary artery calcifications seen. 5.  Cholelithiasis; gallbladder otherwise unremarkable in appearance. 6.  Small left renal cyst noted. 7.  Several small anterior abdominal wall hernias noted at the upper abdomen, containing only fat and demonstrating mild soft tissue stranding.   Original Report Authenticated By: Tonia Ghent, M.D.   Dg Chest Portable 1 View  06/22/2013   *RADIOLOGY REPORT*  Clinical Data: Fall, rib pain  CHEST - 1 VIEW  Comparison:  05/14/2013  Findings: The heart size and mediastinal contours are within normal limits.  Both lungs are clear.  Detail obscured by portable technique and cardiac leads.  Remote fracture of the left posterior fifth rib is re-identified. Acute fractures of the left lateral sixth, seventh, eighth, and ninth ribs noted without pneumothorax.  IMPRESSION:  Acute fractures of the left sixth, seventh, eighth, and ninth ribs.   Original Report Authenticated By: Christiana Pellant, M.D.    Review of Systems  Constitutional: Negative.   HENT: Negative.   Eyes: Negative.   Respiratory: Positive for shortness of breath.   Cardiovascular: Positive for chest pain.  Gastrointestinal: Negative.   Genitourinary: Negative.   Musculoskeletal: Negative.    Skin: Negative.   Neurological: Negative.   Endo/Heme/Allergies: Negative.   Psychiatric/Behavioral: Negative.     Blood pressure 155/88, pulse 102, temperature 98.1 F (36.7 C), temperature source Oral, resp. rate 15, SpO2 96.00%. Physical Exam  Constitutional: He is oriented to person, place, and time. He appears well-developed and well-nourished.  HENT:  Head: Normocephalic and atraumatic.  Eyes: Conjunctivae and EOM are normal. Pupils are equal, round, and reactive to light.  Neck: Normal range of motion. Neck supple.  Cardiovascular: Normal rate, regular rhythm and normal heart sounds.   Respiratory:  Increased work of breathing. Tender over left chest  GI: Bowel sounds are normal.  Ventral hernia, reducible. No tenderness  Musculoskeletal: Normal range of motion.  Neurological: He is alert and oriented to person, place, and time.  Skin: Skin is warm and dry.  Psychiatric: He has a normal mood and affect. His behavior is normal.     Assessment/Plan The pt fell and fractured multiple ribs on the left. He is also a substance abuser. Will admit to stepdown unit to work on pain control and pulmonary toilet. He will need DT prevention  Zachary [name],Zachary [name] 06/22/2013, 6:48 AM   Procedures

## 2013-06-22 NOTE — ED Notes (Addendum)
Suffered a fall outside resident; fell on top of 40 oz. Beer that was under arm; c/o lt. Rib pain. Onset ~2330.  ? Crepitus lt. Side. No flail chest. ems gave 100 mcq of fentanyl ivp.

## 2013-06-23 DIAGNOSIS — W19XXXA Unspecified fall, initial encounter: Secondary | ICD-10-CM

## 2013-06-23 DIAGNOSIS — S2242XA Multiple fractures of ribs, left side, initial encounter for closed fracture: Secondary | ICD-10-CM

## 2013-06-23 MED ORDER — NAPROXEN 500 MG PO TABS
500.0000 mg | ORAL_TABLET | Freq: Two times a day (BID) | ORAL | Status: DC
  Administered 2013-06-23 – 2013-06-28 (×11): 500 mg via ORAL
  Filled 2013-06-23 (×13): qty 1

## 2013-06-23 MED ORDER — OXYCODONE HCL 5 MG PO TABS: 5.0000 mg | ORAL_TABLET | ORAL | Status: DC | PRN

## 2013-06-23 MED ORDER — ENOXAPARIN SODIUM 40 MG/0.4ML ~~LOC~~ SOLN
40.0000 mg | SUBCUTANEOUS | Status: DC
  Administered 2013-06-23 – 2013-06-28 (×6): 40 mg via SUBCUTANEOUS
  Filled 2013-06-23 (×6): qty 0.4

## 2013-06-23 MED ORDER — SPIRITUS FRUMENTI
1.0000 | Freq: Three times a day (TID) | ORAL | Status: DC
  Administered 2013-06-23 – 2013-06-24 (×3): 1 via ORAL
  Filled 2013-06-23 (×8): qty 1

## 2013-06-23 MED ORDER — FENTANYL CITRATE 0.05 MG/ML IJ SOLN: 50.0000 ug | INTRAMUSCULAR | Status: DC | PRN

## 2013-06-23 MED ORDER — TRAMADOL HCL 50 MG PO TABS
100.0000 mg | ORAL_TABLET | Freq: Four times a day (QID) | ORAL | Status: DC
  Administered 2013-06-23 – 2013-06-25 (×8): 100 mg via ORAL
  Filled 2013-06-23 (×4): qty 2
  Filled 2013-06-23: qty 1
  Filled 2013-06-23 (×7): qty 2

## 2013-06-23 NOTE — Progress Notes (Signed)
Patient ID: Zachary [name], male   DOB: March 04, 1941, 72 y.o.   MRN: [id number]   LOS: 1 day   Subjective: C/o pain in left chest, seems mildly agitated. Sitter in room.   Objective: Vital signs in last 24 hours: Temp:  [98 F (36.7 C)-98.9 F (37.2 C)] 98 F (36.7 C) (07/21 0413) Pulse Rate:  [67-107] 78 (07/21 0413) Resp:  [14-22] 14 (07/21 0413) BP: (138-196)/(44-129) 140/78 mmHg (07/21 0413) SpO2:  [87 %-97 %] 93 % (07/21 0413) Weight:  [147 lb 4.3 oz (66.8 kg)] 147 lb 4.3 oz (66.8 kg) (07/20 1615) Last BM Date: 06/21/13   IS: Not present   Physical Exam General appearance: alert and mild distress Resp: clear to auscultation bilaterally Cardio: regular rate and rhythm GI: Soft, +BS, ventral hernia Neuro: Ox2 (didn't know hospital)   Assessment/Plan: Fall Left rib fxs -- Pulmonary toilet EtOH -- CIWA, will add beer Multiple medical problems -- Home meds FEN -- Give orals for pain, will schedule tramadol to limit use of narcotics VTE -- SCD's, Start Lovenox Dispo -- Continue SDU with agitation, PT/OT   Freeman Caldron, PA-C Pager: [phone] General Trauma PA Pager: [phone]   06/23/2013

## 2013-06-23 NOTE — Progress Notes (Signed)
Pt was lying in bed when I arrived. He explained the fall that brought him to the hospital and said he has broken ribs; pt said he was feeling better. Pt's sitter was bedside. Marjory Lies Chaplain  06/23/13 1200  Clinical Encounter Type  Visited With Patient;Health care provider

## 2013-06-23 NOTE — Progress Notes (Signed)
Patient is at high risk for faillure and intubation.  Will watch closely.  This patient has been seen and I agree with the findings and treatment plan.  Marta Lamas. Gae Bon, MD, FACS [phone] (pager) [phone] (direct pager) Trauma Surgeon

## 2013-06-23 NOTE — Plan of Care (Signed)
Problem: Phase I Progression Outcomes Goal: Pain controlled with appropriate interventions Outcome: Progressing Pain is controlled with prn medications Goal: Voiding-avoid urinary catheter unless indicated Outcome: Progressing Pt is voiding Goal: Hemodynamically stable Outcome: Progressing vss will continue to monitor

## 2013-06-24 ENCOUNTER — Inpatient Hospital Stay (HOSPITAL_COMMUNITY): Payer: Medicare Other

## 2013-06-24 MED ORDER — IPRATROPIUM-ALBUTEROL 20-100 MCG/ACT IN AERS
1.0000 | INHALATION_SPRAY | Freq: Four times a day (QID) | RESPIRATORY_TRACT | Status: DC
  Administered 2013-06-24 – 2013-06-26 (×9): 1 via RESPIRATORY_TRACT
  Filled 2013-06-24: qty 4

## 2013-06-24 MED ORDER — HYDROCODONE-ACETAMINOPHEN 5-325 MG PO TABS: 0.5000 | ORAL_TABLET | ORAL | Status: DC | PRN

## 2013-06-24 MED ORDER — SPIRITUS FRUMENTI
2.0000 | Freq: Three times a day (TID) | ORAL | Status: DC
  Administered 2013-06-24 – 2013-06-25 (×3): 2 via ORAL
  Administered 2013-06-26: 1 via ORAL
  Administered 2013-06-26 (×2): 2 via ORAL
  Administered 2013-06-27: 1 via ORAL
  Administered 2013-06-27 (×2): 2 via ORAL
  Administered 2013-06-28 (×2): 1 via ORAL
  Administered 2013-06-28: 2 via ORAL
  Filled 2013-06-24 (×18): qty 2

## 2013-06-24 NOTE — Progress Notes (Signed)
Pt to TX to 6N-22, called report. PT/OT working w/Pt this am. VSS.

## 2013-06-24 NOTE — Progress Notes (Signed)
UR completed 

## 2013-06-24 NOTE — Clinical Documentation Improvement (Signed)
THIS DOCUMENT IS NOT A PERMANENT PART OF THE MEDICAL RECORD  Please update your documentation within the medical record to reflect your response to this query. If you need help knowing how to do this please call [phone].  06/24/13  To Freeman Caldron, PA-C Marton Redwood  In a better effort to capture your patient's severity of illness, reflect appropriate length of stay and utilization of resources, a review of the medical record has revealed the following indicators.    Based on your clinical judgment, please clarify and document in a progress note and/or discharge summary the clinical condition associated with the following supporting information:  In responding to this query please exercise your independent judgment.  The fact that a query is asked, does not imply that any particular answer is desired or expected.  Abnormal findings (laboratory, x-ray, pathologic, and other diagnostic results) are not coded and reported unless the physician indicates their clinical significance.   The medical record reflects the following clinical findings,   Please clarify the diagnostic and/or clinical significance:    7/21  Chest xray : Impression :  Decreased lung volumes with accentuation of interstitial markings  and bibasilar atelectasis.  Multiple left rib fractures.     Possible Clinical Conditions?                      bibasilar atelectasis              Other Condition        Cannot Clinically Determine      Treatment: Inhaler albuterol/combivent, ? ISSP  Evaluation: Daily chest xray    Reviewed:  no additional documentation provided -- rib fxs is the dx   Thank Claud Kelp Addison  Clinical Documentation Specialist: [phone] Health Information Management [place]

## 2013-06-24 NOTE — Evaluation (Signed)
Physical Therapy Evaluation Patient Details Name: Zachary [name] MRN: [id number] DOB: [birth year] Today's Date: 06/24/2013 Time: 7829-5621 PT Time Calculation (min): 25 min  PT Assessment / Plan / Recommendation History of Present Illness  This is a 72 year old male who was walking home and fell. He was cradling a 40 ounce beer under his left arm and states he fell onto this beer. He is complaining of moderate to severe pain in the left ribs.  CT shows fx's of L 6-9 rib.  Clinical Impression  Pt admitted after fall sustaining L 6-9th rib fractures . Pt currently with functional limitations due to the deficits listed below (see PT Problem List).   Pt will benefit from skilled PT to increase their independence and safety with mobility to allow ability to get back home with available assist.     PT Assessment  Patient needs continued PT services    Follow Up Recommendations  Home health PT;Supervision for mobility/OOB (as long as family can provide significant support o/w SNF)    Does the patient have the potential to tolerate intense rehabilitation      Barriers to Discharge        Equipment Recommendations  Rolling walker with 5" wheels    Recommendations for Other Services     Frequency Min 3X/week    Precautions / Restrictions Precautions Precautions: Fall   Pertinent Vitals/Pain sats on 4 L at 92% and EHR 73bpm      Mobility  Bed Mobility Bed Mobility: Supine to Sit;Sitting - Scoot to Edge of Bed;Sit to Supine Supine to Sit: 3: Mod assist;HOB elevated Sitting - Scoot to Edge of Bed: 4: Min assist Details for Bed Mobility Assistance: truncal assist due to rib pain Transfers Transfers: Sit to Stand;Stand to Sit Sit to Stand: 4: Min assist;From bed Stand to Sit: 4: Min assist;With upper extremity assist;To chair/3-in-1 Details for Transfer Assistance: vc's for safe technique.  steady assist Ambulation/Gait Ambulation/Gait Assistance: 3: Mod assist Ambulation Distance  (Feet): 50 Feet Assistive device: Other (Comment) (pushing w/c) Ambulation/Gait Assistance Details: mod assist whether stabilizing patient or helping control the w/c Gait Pattern: Step-through pattern;Decreased step length - right;Decreased step length - left;Decreased stride length (mildly ataxic/festinating) Stairs: No Wheelchair Mobility Wheelchair Mobility: No    Exercises     PT Diagnosis: Difficulty walking;Generalized weakness  PT Problem List: Decreased strength;Decreased activity tolerance;Decreased balance;Decreased mobility;Decreased knowledge of use of DME;Cardiopulmonary status limiting activity PT Treatment Interventions: DME instruction;Gait training;Stair training;Functional mobility training;Therapeutic activities;Balance training;Cognitive remediation     PT Goals(Current goals can be found in the care plan section) Acute Rehab PT Goals Patient Stated Goal: get out of here PT Goal Formulation: With patient Time For Goal Achievement: 07/01/13 Potential to Achieve Goals: Good  Visit Information  Last PT Received On: 06/24/13 Assistance Needed: +2 (safety with gait) History of Present Illness: This is a 72 year old male who was walking home and fell. He was cradling a 40 ounce beer under his left arm and states he fell onto this beer. He is complaining of moderate to severe pain in the left ribs.  CT shows fx's of L 6-9 rib.       Prior Functioning  Home Living Family/patient expects to be discharged to:: Private residence Living Arrangements:  (per pt lives with sister and sister in law.) Available Help at Discharge: Family;Other (Comment) (unsure how much assist, but sister doesn't work) Type of Home: House Home Access: Stairs to enter [employer] of Steps: 2 Entrance Stairs-Rails: Right;Left Home Layout:  Two level;Able to live on main level with bedroom/bathroom Home Equipment: None Prior Function Level of Independence: Independent Comments: used  RW to walk but someone threw it out Communication Communication: No difficulties    Cognition  Cognition Arousal/Alertness: Lethargic Overall Cognitive Status: No family/caregiver present to determine baseline cognitive functioning Area of Impairment: Orientation;Safety/judgement    Extremity/Trunk Assessment Upper Extremity Assessment Upper Extremity Assessment: Defer to OT evaluation Lower Extremity Assessment Lower Extremity Assessment: Generalized weakness   Balance Balance Balance Assessed: Yes Static Sitting Balance Static Sitting - Balance Support: No upper extremity supported;Feet supported Static Sitting - Level of Assistance: 5: Stand by assistance  End of Session PT - End of Session Equipment Utilized During Treatment: Oxygen Activity Tolerance: Patient tolerated treatment well Patient left: Other (comment) (sitting in w/c ready for transport to another floor.) Nurse Communication: Mobility status  GP     Nelia Rogoff, Eliseo Gum 06/24/2013, 11:23 AM  06/24/2013  Caspar Bing, PT [phone]  (pager)

## 2013-06-24 NOTE — Progress Notes (Signed)
ETOH withdrawal, C/O weird dreams. Beer increased, transfer to floor with sitter. Patient examined and I agree with the assessment and plan  Violeta Gelinas, MD, MPH, FACS Pager: [phone]  06/24/2013 9:20 AM

## 2013-06-24 NOTE — Evaluation (Signed)
Occupational Therapy Evaluation Patient Details Name: Zachary [name] MRN: [id number] DOB: 12/01/1941 Today's Date: 06/24/2013 Time: 1045-1100 OT Time Calculation (min): 15 min  OT Assessment / Plan / Recommendation History of present illness This is a 72 year old male who was walking home and fell. He was cradling a 40 ounce beer under his left arm and states he fell onto this beer. He is complaining of moderate to severe pain in the left ribs.  CT shows fx's of L 6-9 rib.   Clinical Impression   Pt with decreased balance, decreased cognition, decreased UE use due to pain all from fall mentioned above.  Pt would benefit from cont OT to increase I with basic adls so he can d/c home with his family.    OT Assessment  Patient needs continued OT Services    Follow Up Recommendations  Home health OT;Supervision/Assistance - 24 hour;Other (comment) (if not available, may need SNF)    Barriers to Discharge Decreased caregiver support Unsure of caregiver support.  Equipment Recommendations       Recommendations for Other Services    Frequency  Min 2X/week    Precautions / Restrictions Precautions Precautions: Fall Restrictions Weight Bearing Restrictions: No   Pertinent Vitals/Pain Pt c/o rib pain.  O2 sats at 94% on 4L O2    ADL  Eating/Feeding: Performed;Minimal assistance Where Assessed - Eating/Feeding: Chair Grooming: Simulated;Minimal assistance Where Assessed - Grooming: Supported standing Upper Body Bathing: Simulated;Minimal assistance Where Assessed - Upper Body Bathing: Supported sitting Lower Body Bathing: Simulated;Moderate assistance Where Assessed - Lower Body Bathing: Supported sit to stand Upper Body Dressing: Simulated;Moderate assistance Where Assessed - Upper Body Dressing: Supported sitting Lower Body Dressing: Simulated;Moderate assistance Where Assessed - Lower Body Dressing: Supported sit to Pharmacist, hospital: Performed;Minimal Holiday representative Method: Surveyor, minerals: Materials engineer and Hygiene: Simulated;Minimal assistance Where Assessed - Engineer, mining and Hygiene: Sit to stand from 3-in-1 or toilet Equipment Used: Other (comment) (pushed w/c to walk w O2 attached) Transfers/Ambulation Related to ADLs: Pt unsteady on his feet and had difficulty maintaining a controlled pace walking. ADL Comments: Pt's cognitive status and pain level in L side affects ability to complete UE and LE adls effectively.     OT Diagnosis: Generalized weakness;Cognitive deficits  OT Problem List: Decreased strength;Decreased activity tolerance;Impaired balance (sitting and/or standing);Impaired vision/perception;Decreased cognition;Decreased safety awareness;Decreased knowledge of use of DME or AE;Decreased knowledge of precautions;Pain OT Treatment Interventions: Self-care/ADL training;Therapeutic activities;DME and/or AE instruction   OT Goals(Current goals can be found in the care plan section) Acute Rehab OT Goals Patient Stated Goal: get out of here OT Goal Formulation: With patient Time For Goal Achievement: 07/08/13 Potential to Achieve Goals: Good ADL Goals Pt Will Perform Grooming: with supervision;standing Pt Will Perform Lower Body Dressing: with supervision;sit to/from stand Pt Will Perform Tub/Shower Transfer: with supervision;ambulating Additional ADL Goal #1: Pt will complete all aspects of toileting with S Additional ADL Goal #2: Pt will answer simple safey/orientation questions with 100% accuracy.  Visit Information  Last OT Received On: 06/24/13 Assistance Needed: +2 PT/OT Co-Evaluation/Treatment: Yes History of Present Illness: This is a 72 year old male who was walking home and fell. He was cradling a 40 ounce beer under his left arm and states he fell onto this beer. He is complaining of moderate to severe pain in the left ribs.  CT shows fx's of  L 6-9 rib.       Prior Functioning  Home Living Family/patient expects to be discharged to:: Private residence Living Arrangements: Other (Comment) (sisters) Available Help at Discharge: Available PRN/intermittently;Other (Comment) (unsure.  sisters do not work) Type of Home: House Home Access: Stairs to enter Secretary/administrator of Steps: 2 Entrance Stairs-Rails: Right;Left Home Layout: Two level;Able to live on main level with bedroom/bathroom Home Equipment: None Additional Comments: Not sure of accuracy of bathroom set up. Prior Function Level of Independence: Independent Comments: used RW to walk but someone threw it out Communication Communication: No difficulties Dominant Hand: Right         Vision/Perception Vision - History Baseline Vision: No visual deficits Patient Visual Report: No change from baseline Vision - Assessment Vision Assessment: Vision not tested Additional Comments: Pt states he has glasses but they don't do a bit of good.  Pt did not have attn span today to test vision closely.   Cognition  Cognition Arousal/Alertness: Lethargic Behavior During Therapy: WFL for tasks assessed/performed Overall Cognitive Status: No family/caregiver present to determine baseline cognitive functioning Area of Impairment: Orientation;Safety/judgement Orientation Level: Disoriented to;Time Memory: Decreased recall of precautions Safety/Judgement: Decreased awareness of safety;Decreased awareness of deficits General Comments: Unsure pts baseline.  Pt also may be going through DTs affecting cognition.    Extremity/Trunk Assessment Upper Extremity Assessment Upper Extremity Assessment: Generalized weakness Lower Extremity Assessment Lower Extremity Assessment: Defer to PT evaluation Cervical / Trunk Assessment Cervical / Trunk Assessment: Normal     Mobility Bed Mobility Bed Mobility: Supine to Sit;Sitting - Scoot to Edge of Bed;Sit to Supine Supine to  Sit: 3: Mod assist;HOB elevated Sitting - Scoot to Edge of Bed: 4: Min assist Details for Bed Mobility Assistance: truncal assist due to rib pain Transfers Transfers: Sit to Stand;Stand to Sit Sit to Stand: 4: Min assist;From bed Stand to Sit: 4: Min assist;With upper extremity assist;To chair/3-in-1 Details for Transfer Assistance: vc's for safe technique.  steady assist     Exercise     Balance Balance Balance Assessed: Yes Static Sitting Balance Static Sitting - Balance Support: No upper extremity supported;Feet supported Static Sitting - Level of Assistance: 5: Stand by assistance   End of Session OT - End of Session Equipment Utilized During Treatment: Other (comment);Oxygen (w/c) Activity Tolerance: Patient limited by lethargy Patient left: in chair;Other (comment) (sitter taking him to new room on 6N) Nurse Communication: Mobility status  GO     Hope Budds 06/24/2013, 12:44 PM [phone]

## 2013-06-24 NOTE — Progress Notes (Signed)
Patient ID: Zachary [name], male   DOB: 09-09-1941, 72 y.o.   MRN: [id number]   LOS: 2 days   Subjective: Says he's doing ok, pain medicine helping. Still mildly agitated.   Objective: Vital signs in last 24 hours: Temp:  [97.3 F (36.3 C)-99.9 F (37.7 C)] 98.5 F (36.9 C) (07/22 0729) Pulse Rate:  [61-93] 61 (07/22 0338) Resp:  [21-33] 24 (07/22 0338) BP: (123-178)/(67-90) 151/87 mmHg (07/22 0729) SpO2:  [92 %-97 %] 92 % (07/22 0338) Last BM Date: 06/21/13   IS: Not available   Radiology Results PORTABLE CHEST - 1 VIEW  Comparison: Portable exam 0637 hours compared to 06/22/2013  Findings:  Upper normal heart size.  Calcified tortuous aorta.  Prominent central pulmonary arteries.  Peribronchial thickening with slightly increased interstitial  markings versus previous exam.  Mild bibasilar atelectasis.  No gross pleural effusion or pneumothorax.  Again identified fractures of the the left seventh eighth and ninth  ribs.  Jewelry artifacts at cervical region and thoracic inlet.  IMPRESSION:  Decreased lung volumes with accentuation of interstitial markings  and bibasilar atelectasis.  Multiple left rib fractures.  Original Report Authenticated By: Ulyses Southward, M.D.   Physical Exam General appearance: alert and distracted Resp: expiratory wheezes bilaterally Cardio: regular rate and rhythm GI: normal findings: bowel sounds normal and soft, non-tender Neuro: Ox2.[facility])   Assessment/Plan: Fall  Left rib fxs -- Pulmonary toilet (will investigate why no IS), add scheduled Combivent EtOH -- CIWA, will increase beer  Multiple medical problems -- Home meds  FEN -- Tramadol, naproxen seem to be controlling pain. Will change prn from oxyIR to Norco. VTE -- SCD's, Lovenox  Dispo -- Can transfer to floor but continue sitter with agitation, PT/OT    Freeman Caldron, PA-C Pager: [phone] General Trauma PA Pager: [phone]   06/24/2013

## 2013-06-25 MED ORDER — ALUM & MAG HYDROXIDE-SIMETH 200-200-20 MG/5ML PO SUSP: 15.0000 mL | ORAL | Status: DC | PRN

## 2013-06-25 MED ORDER — TRAMADOL HCL 50 MG PO TABS
50.0000 mg | ORAL_TABLET | Freq: Four times a day (QID) | ORAL | Status: DC | PRN
  Administered 2013-06-26 (×2): 100 mg via ORAL
  Administered 2013-06-26: 50 mg via ORAL
  Administered 2013-06-27 – 2013-06-28 (×4): 100 mg via ORAL
  Filled 2013-06-25 (×6): qty 2

## 2013-06-25 NOTE — Clinical Social Work Note (Signed)
Clinical Social Work Department BRIEF PSYCHOSOCIAL ASSESSMENT 06/25/2013  Patient:  Zachary [name],Zachary [name]     Account Number:  [id number]     Admit date:  06/22/2013  Clinical Social Worker:  Verl Blalock  Date/Time:  06/25/2013 04:39 PM  Referred by:  Physician  Date Referred:  06/25/2013 Referred for  Substance Abuse  Other - See comment   Other Referral:   Arrangements at home for 24 hour support   Interview type:  Patient Other interview type:   Attempted to call patient friend/roommate - left message at 16:40    PSYCHOSOCIAL DATA Living Status:  FRIEND(S) Admitted from facility:   Level of care:   Primary support name:  Zachary [name] [phone] Primary support relationship to patient:  FRIEND Degree of support available:   Fair    CURRENT CONCERNS Current Concerns  Post-Acute Placement  Substance Abuse   Other Concerns:   Continued ETOH use and lack of support    SOCIAL WORK ASSESSMENT / PLAN Clinical Social Worker met with patient at bedside to offer support, discuss patient discharge plans, and inquire about current substance use.  Patient states that he lives at home with his friend and friends daugther.  Patient states that he fell backwards down the stair after drinking too much.  Per notes, patient was walking home and tripped and fell on 40oz beer bottle.  Patient plans to return home with his friend - CSW has left a message with patient friend/roommate to discuss patient support at home upon discharge.    Clinical Social Worker inquired about current substance use.  Patient states that he drinks 2 40's of beer a day everyday.  Patient has been to treatment before at [facility] and plans to think about another visit there, however at this time just wants to go home and keep drinking.  Patient seems to not be fully oriented at this time - CSW to reevaluate again prior to discharge.  SBIRT complete.  No resources provided at this time.  CSW will continue to follow  patient for support and to assist with locating friends/family to confirm discharge plans.   Assessment/plan status:  Psychosocial Support/Ongoing Assessment of Needs Other assessment/ plan:   Information/referral to community resources:   Patient not appropriate for substance use resources at this time but will continue to follow throughout hospitalization    PATIENT'S/FAMILY'S RESPONSE TO PLAN OF CARE: Patient alert and oriented to self only.  Patient with no visitors since hospitalization per staff.  CSW has contacted his roommate/friend (Zachary [name]) to further discuss patient plans at discharge.  Patient is adamant on return home once medically ready.  Patient polite and willing to participate, however limited due to cognition at this time.

## 2013-06-25 NOTE — Progress Notes (Signed)
Still a bit jittery, and sats are low.  Not home yet.  This patient has been seen and I agree with the findings and treatment plan.  Marta Lamas. Gae Bon, MD, FACS [phone] (pager) [phone] (direct pager) Trauma Surgeon

## 2013-06-25 NOTE — Progress Notes (Signed)
Patient ID: Domonic Kimball, male   DOB: [birth year], 72 y.o.   MRN: [id number]   LOS: 3 days   Subjective: No new c/o. Oriented for me but RN reports disoriented earlier this AM. Pain meds working.   Objective: Vital signs in last 24 hours: Temp:  [97.2 F (36.2 C)-98.2 F (36.8 C)] 97.9 F (36.6 C) (07/23 0514) Pulse Rate:  [61-72] 66 (07/23 0514) Resp:  [16-19] 16 (07/23 0514) BP: (136-166)/(69-77) 143/69 mmHg (07/23 0514) SpO2:  [90 %-96 %] 90 % (07/23 0514) Last BM Date: 06/21/13   Physical Exam General appearance: alert and no distress Resp: clear to auscultation bilaterally Cardio: regular rate and rhythm GI: normal findings: bowel sounds normal and soft, non-tender   Assessment/Plan: Fall  Left rib fxs -- Pulmonary toilet (still no IS!), scheduled Combivent. Still needing a fair amount of O2. EtOH -- CIWA, beer. Will d/c sitter once we can get a camera room Multiple medical problems -- Home meds  FEN -- Tramadol, naproxen seem to be controlling pain.   VTE -- SCD's, Lovenox  Dispo -- PT/OT, home when mobility improved and/or family can provide support. May have to d/c with home O2    Freeman Caldron, PA-C Pager: [phone] General Trauma PA Pager: [phone]   06/25/2013

## 2013-06-26 MED ORDER — IPRATROPIUM-ALBUTEROL 20-100 MCG/ACT IN AERS
2.0000 | INHALATION_SPRAY | Freq: Four times a day (QID) | RESPIRATORY_TRACT | Status: DC | PRN
  Administered 2013-06-27: 2 via RESPIRATORY_TRACT
  Filled 2013-06-26: qty 4

## 2013-06-26 MED ORDER — IPRATROPIUM-ALBUTEROL 20-100 MCG/ACT IN AERS
1.0000 | INHALATION_SPRAY | Freq: Three times a day (TID) | RESPIRATORY_TRACT | Status: DC
  Administered 2013-06-27 – 2013-06-28 (×5): 1 via RESPIRATORY_TRACT
  Filled 2013-06-26: qty 4

## 2013-06-26 NOTE — Progress Notes (Signed)
o2 sats--- sitting 86-88% RA                  Ambulating 100% RA

## 2013-06-26 NOTE — Clinical Social Work Note (Signed)
Clinical Social Worker met with patient at bedside to offer continued support and discuss patient plans at discharge.  Patient agreeable to return home with Zachary [name].  CSW spoke with Zachary [name] over the phone who states that someone is home 24 hours a day to assist with patient needs.  Patient and friend (Zachary) are agreeable to home health.  Patient address is correct and plans to discharge to this address.  CSW has notified CM and will follow up with patient tomorrow to confirm discharge plans.  Macario Golds, [place] [phone]

## 2013-06-26 NOTE — Progress Notes (Signed)
PT Cancellation Note  Patient Details Name: Zachary [name] MRN: [id number] DOB: 11-21-1941   Cancelled Treatment:    Reason Eval/Treat Not Completed: Patient declined, no reason specified;Other (comment) (just back in bed with the tech) 06/26/2013  Gordon Bing, PT [phone]  (pager)   Aixa Corsello, Eliseo Gum 06/26/2013, 3:14 PM

## 2013-06-26 NOTE — Progress Notes (Signed)
Patient ID: Zachary [name], male   DOB: May 12, 1941, 72 y.o.   MRN: [id number]   LOS: 4 days   Subjective: No new c/o.   Objective: Vital signs in last 24 hours: Temp:  [98.3 F (36.8 C)-98.9 F (37.2 C)] 98.3 F (36.8 C) (07/24 0531) Pulse Rate:  [63] 63 (07/24 0531) Resp:  [17-18] 18 (07/24 0531) BP: (123-124)/(63-68) 123/63 mmHg (07/24 0531) SpO2:  [93 %-100 %] 97 % (07/24 0531) Last BM Date: 06/21/13   IS:   Physical Exam General appearance: alert, no distress and slowed mentation Resp: clear to auscultation bilaterally Cardio: regular rate and rhythm GI: normal findings: bowel sounds normal and soft, non-tender   Assessment/Plan: Fall  Left rib fxs -- Pulmonary toilet. Will have RN get resting and ambulatory sats on RA. EtOH -- CIWA, beer. D/C sitter Multiple medical problems -- Home meds  FEN -- Tramadol, naproxen seem to be controlling pain.  VTE -- SCD's, Lovenox  Dispo -- PT/OT, pt lives with GF who can provide 24h support. May need home O2. Will contact her to ensure she can provide for his needs. Possibly home today but may need another day.    Freeman Caldron, PA-C Pager: [phone] General Trauma PA Pager: [phone]   06/26/2013

## 2013-06-26 NOTE — Progress Notes (Signed)
Patients continous pulse oximeter in room broken. Called [facility] to request new machine. I was informed that there is none available in the hospital at this time but he will keep looking for one and will bring one up as soon as he finds one. Pt. Patient with history of COPD, currently on O2 4l/[place] saturating 90-93%, not in any distress, no complaints of SOB. Will do O2 sats spot checks on the patient. Will continue to monitor.  KYoung RN

## 2013-06-26 NOTE — Progress Notes (Signed)
Check room air sats.  May need to go home on O2. Drinking his beer. Patient examined and I agree with the assessment and plan  Violeta Gelinas, MD, MPH, FACS Pager: [phone]  06/26/2013 9:44 AM

## 2013-06-27 ENCOUNTER — Inpatient Hospital Stay (HOSPITAL_COMMUNITY): Payer: Medicare Other

## 2013-06-27 DIAGNOSIS — S2249XA Multiple fractures of ribs, unspecified side, initial encounter for closed fracture: Secondary | ICD-10-CM

## 2013-06-27 DIAGNOSIS — F10229 Alcohol dependence with intoxication, unspecified: Secondary | ICD-10-CM

## 2013-06-27 DIAGNOSIS — I10 Essential (primary) hypertension: Secondary | ICD-10-CM

## 2013-06-27 LAB — URINALYSIS, ROUTINE W REFLEX MICROSCOPIC
Hgb urine dipstick: NEGATIVE
Leukocytes, UA: NEGATIVE
Nitrite: NEGATIVE
Specific Gravity, Urine: 1.018 (ref 1.005–1.030)
Urobilinogen, UA: >8 mg/dL — ABNORMAL HIGH (ref 0.0–1.0)

## 2013-06-27 LAB — COMPREHENSIVE METABOLIC PANEL
ALT: 37 U/L (ref 0–53)
Calcium: 9.4 mg/dL (ref 8.4–10.5)
GFR calc Af Amer: >90 mL/min (ref >90)
Glucose, Bld: 88 mg/dL (ref 70–99)
Sodium: 137 mEq/L (ref 135–145)
Total Protein: 6.9 g/dL (ref 6.0–8.3)

## 2013-06-27 LAB — CBC
Hemoglobin: 14.5 g/dL (ref 13.0–17.0)
MCH: 34.2 pg — ABNORMAL HIGH (ref 26.0–34.0)
MCHC: 35 g/dL (ref 30.0–36.0)
Platelets: 151 10*3/uL (ref 150–400)

## 2013-06-27 LAB — TSH: TSH: 0.832 u[IU]/mL (ref 0.350–4.500)

## 2013-06-27 MED ORDER — POLYETHYLENE GLYCOL 3350 17 G PO PACK
17.0000 g | PACK | Freq: Every day | ORAL | Status: DC
  Administered 2013-06-27 – 2013-06-28 (×2): 17 g via ORAL
  Filled 2013-06-27 (×2): qty 1

## 2013-06-27 MED ORDER — ADULT MULTIVITAMIN W/MINERALS CH: 1.0000 | ORAL_TABLET | Freq: Every day | ORAL | Status: DC

## 2013-06-27 MED ORDER — HYDRALAZINE HCL 20 MG/ML IJ SOLN: 5.0000 mg | Freq: Four times a day (QID) | INTRAMUSCULAR | Status: DC | PRN

## 2013-06-27 MED ORDER — LORAZEPAM 2 MG/ML IJ SOLN: 1.0000 mg | Freq: Four times a day (QID) | INTRAMUSCULAR | Status: DC | PRN

## 2013-06-27 MED ORDER — LORAZEPAM 1 MG PO TABS
1.0000 mg | ORAL_TABLET | Freq: Four times a day (QID) | ORAL | Status: DC | PRN
  Administered 2013-06-27 – 2013-06-28 (×4): 1 mg via ORAL
  Filled 2013-06-27 (×4): qty 1

## 2013-06-27 MED ORDER — HALOPERIDOL LACTATE 5 MG/ML IJ SOLN: 2.0000 mg | Freq: Four times a day (QID) | INTRAMUSCULAR | Status: DC | PRN

## 2013-06-27 MED ORDER — DOCUSATE SODIUM 100 MG PO CAPS
100.0000 mg | ORAL_CAPSULE | Freq: Two times a day (BID) | ORAL | Status: DC
  Administered 2013-06-27 – 2013-06-28 (×3): 100 mg via ORAL
  Filled 2013-06-27 (×2): qty 1

## 2013-06-27 NOTE — Progress Notes (Signed)
Physical Therapy Treatment Patient Details Name: Zachary [name] MRN: [id number] DOB: 02-03-1941 Today's Date: 06/27/2013 Time: 4132-4401 PT Time Calculation (min): 38 min  PT Assessment / Plan / Recommendation  History of Present Illness This is a 72 year old male who was walking home and fell. He was cradling a 40 ounce beer under his left arm and states he fell onto this beer. He is complaining of moderate to severe pain in the left ribs.  CT shows fx's of L 6-9 rib.   Clinical Impression Pt able to tolerate a long endurance set of tasks at the sink with cues for postural/balance checks as well as guidance on ADL tasks.  Pt fatigued half way through the tasks and needed a chair to continue.  Gait marked by instability  Which improves with assistive device and poor use of the device when in use.   PT Comments   sats at upper 80's to lower 90's off oxygen  And mid 90's on 4 L [place]  Follow Up Recommendations  Home health PT;Supervision for mobility/OOB     Does the patient have the potential to tolerate intense rehabilitation     Barriers to Discharge        Equipment Recommendations  Other (comment) (TBA further with family)    Recommendations for Other Services    Frequency Min 3X/week   Progress towards PT Goals Progress towards PT goals: Progressing toward goals  Plan Current plan remains appropriate    Precautions / Restrictions Precautions Precautions: Fall Restrictions Weight Bearing Restrictions: No   Pertinent Vitals/Pain     Mobility  Bed Mobility Bed Mobility: Supine to Sit;Sitting - Scoot to Edge of Bed Supine to Sit: 4: Min assist Sitting - Scoot to [place] of Bed: 4: Min assist Details for Bed Mobility Assistance: assist to initiate and truncal assist Transfers Transfers: Sit to Stand;Stand to Sit Sit to Stand: 4: Min assist;With upper extremity assist;From bed;From chair/3-in-1 Stand to Sit: 4: Min assist;With upper extremity assist;To chair/3-in-1 Details for  Transfer Assistance: vc/tc's for safe technique/hand placement.  steady assist Ambulation/Gait Ambulation/Gait Assistance: 4: Min assist;3: Mod assist Ambulation Distance (Feet): 15 Feet (to bathroom, 150 feet in halls mostly with RW) Assistive device: 1 person hand held assist;2 person hand held assist;Rolling walker Ambulation/Gait Assistance Details: moderate stability assist for general unsteadiness mixed with mild ataxia.  Safer with RW, but does not stay appropriately within the RW or maneuver it well. Gait Pattern: Step-through pattern;Decreased step length - right;Decreased step length - left;Decreased stride length Stairs: No    Exercises     PT Diagnosis:    PT Problem List:   PT Treatment Interventions:     PT Goals (current goals can now be found in the care plan section) Acute Rehab PT Goals Patient Stated Goal: get out of here PT Goal Formulation: With patient Time For Goal Achievement: 07/01/13 Potential to Achieve Goals: Good  Visit Information  Last PT Received On: 06/27/13 Assistance Needed: +2 History of Present Illness: This is a 72 year old male who was walking home and fell. He was cradling a 40 ounce beer under his left arm and states he fell onto this beer. He is complaining of moderate to severe pain in the left ribs.  CT shows fx's of L 6-9 rib.    Subjective Data  Subjective: Yep.... Patient Stated Goal: get out of here   Cognition  Cognition Arousal/Alertness: Awake/alert Area of Impairment: Attention;Following commands;Safety/judgement;Problem solving Current Attention Level: Sustained Safety/Judgement: Decreased awareness of safety Problem  Solving: Slow processing    Balance  Balance Balance Assessed: Yes Static Sitting Balance Static Sitting - Balance Support: No upper extremity supported;Feet supported Static Sitting - Level of Assistance: 5: Stand by assistance Static Standing Balance Static Standing - Balance Support: Right upper  extremity supported;Left upper extremity supported;No upper extremity supported (brushing teeth/washing face) Static Standing - Level of Assistance: 4: Min assist;5: Stand by assistance Static Standing - Comment/# of Minutes: 8 min  End of Session PT - End of Session Equipment Utilized During Treatment: Oxygen Activity Tolerance: Patient tolerated treatment well;Patient limited by fatigue Patient left: in chair;with call bell/phone within reach;with chair alarm set Nurse Communication: Mobility status   GP     Hazim Treadway, Eliseo Gum 06/27/2013, 1:58 PM

## 2013-06-27 NOTE — Consult Note (Signed)
[facility] Medical Consultation  Zachary [name] ZOX:[id number] DOB: June 23, 1941 DOA: 06/22/2013 PCP: Provider Not In System   Requesting physician: Trauma service Date of consultation: 7/25 Reason for consultation: agitation  Impression/Recommendations Active Problems:   Alcohol dependence   Fall   Multiple fractures of ribs of left side    1. Agitation/ restlessness: possibly related to alcohol withdrawal. He was started on CIWA protocol. He received 1 mg of ativan and calmed down. His EKG does not show any qt prolongation and he was started on haldol for agitation if needed.  2. H/o fall with multiple rib fractures on the left side: further management as per trauma service. Encourage using  incentive spirometry 10 times every hour he is awake. Adequate pain control needed. CXR shows worsening left lower lobe opacity. He is currently afebrile and no leukocytosis. No indication of antibiotics.   3. Accelerated hypertension: possibly from a combination of alcohol withdrawal and pain. Prn hydralazine ordered. Pain control.   4. Copd; scattered wheezing heard over the left posterior . Resume nebs as needed.   5. Alcohol abuse: may benefit from substance abuse rehab. CSW consult and referral .   6. Physical deconditioning; he may benefit from SNF placement.   7. Hypoxia: requiring oxygen. Order ABG.   7. DVT prophylaxis : on lovenox.     I will followup again tomorrow. Please contact me if I can be of assistance in the meanwhile. Thank you for this consultation.  Chief Complaint: fall with multiple rib fractures.   HPI:  72 year old gentleman with h/o hyertension, cad, copd, alcohol abuse, came in for a fall with multiple rib fractures on the left side. Admitted by trauma service.   Review of Systems:  Could not be obtained .   Past Medical History  Diagnosis Date  . Hypertension   . Coronary artery disease   . Hyperlipemia   . Angina   . Shortness of breath   .  Mental disorder   . Arthritis   . COPD (chronic obstructive pulmonary disease)   . Hiatal hernia   . Depression   . Gout   . Joint pain   . Depression   . Anxiety    Past Surgical History  Procedure Laterality Date  . Abdominal surgery     Social History:  reports that he has been smoking Cigarettes.  He has a 15 pack-year smoking history. He has quit using smokeless tobacco. He reports that he drinks about 7.2 ounces of alcohol per week. He reports that he does not use illicit drugs.  No Known Allergies History reviewed. No pertinent family history.  Prior to Admission medications   Medication Sig Start Date End Date Taking? Authorizing Provider  albuterol (PROVENTIL HFA;VENTOLIN HFA) 108 (90 BASE) MCG/ACT inhaler Inhale 2 puffs into the lungs every 4 (four) hours as needed for wheezing or shortness of breath. 05/26/13  Yes Sanjuana Kava, NP  gabapentin (NEURONTIN) 100 MG capsule Take 1 capsule (100 mg total) by mouth 3 (three) times daily. For anxiety 05/26/13  Yes Sanjuana Kava, NP  hydrOXYzine (ATARAX/VISTARIL) 25 MG tablet Take 1 tablet (25 mg total) by mouth every 6 (six) hours as needed for anxiety. 05/26/13  Yes Sanjuana Kava, NP  metoprolol tartrate (LOPRESSOR) 25 MG tablet Take 1 tablet (25 mg total) by mouth 2 (two) times daily. For hypertension 05/26/13  Yes Sanjuana Kava, NP  traZODone (DESYREL) 50 MG tablet Take 1 tablet (50 mg total) by mouth at  bedtime as needed and may repeat dose one time if needed for sleep. 05/26/13  Yes Sanjuana Kava, NP   Physical Exam: Blood pressure 200/83, pulse 110, temperature 97.7 F (36.5 C), temperature source Oral, resp. rate 22, height 5\' 6"  (1.676 m), weight 66.8 kg (147 lb 4.3 oz), SpO2 94.00%. Filed Vitals:   06/27/13 0413 06/27/13 0619 06/27/13 0859 06/27/13 0901  BP:  136/80 200/83   Pulse:  58 110   Temp:  97.9 F (36.6 C) 97.7 F (36.5 C)   TempSrc:   Oral   Resp:  18 22   Height:      Weight:      SpO2: 93% 95% 94% 94%     Constitutional: Vital signs reviewed.  Patient is a well-developed and well-nourished  in no acute distress and cooperative with exam. Alert and oriented to person only.   Head: Normocephalic and atraumatic Mouth: no erythema or exudates, MMM Eyes: PERRL, EOMI, conjunctivae normal, No scleral icterus.  Neck: Supple, Trachea midline normal ROM, No JVD, mass, thyromegaly, or carotid bruit present.  Cardiovascular: RRR, S1 normal, S2 normal, no MRG, pulses symmetric and intact bilaterally Pulmonary/Chest: decreased breath sounds on the left side, with scattered wheezing.  Abdominal: Soft. Non-tender, non-distended, bowel sounds are normal, no masses, organomegaly, or guarding present.  Musculoskeletal: No joint deformities, erythema, or stiffness, ROM full and no nontender Hematology: no cervical, inginal, or axillary adenopathy.  Neurological: A&O to place. , Strength is normal no focal motor deficit,  Speech normal and no facial droop. Skin: Warm, dry and intact. No rash, cyanosis, or clubbing.      Labs on Admission:  Basic Metabolic Panel:  Recent Labs Lab 06/22/13 0215 06/22/13 0226 06/22/13 1603 06/27/13 0745  NA 141 143 136 137  K 4.2 4.2 4.4 3.5  CL 104 108 101 99  CO2 25  --  25 32  GLUCOSE 63* 63* 110* 88  BUN 7 5* 7 9  CREATININE 0.89 1.30 0.78 0.79  CALCIUM 8.1*  --  8.7 9.4   Liver Function Tests:  Recent Labs Lab 06/22/13 0215 06/27/13 0745  AST 110* 56*  ALT 54* 37  ALKPHOS 90 88  BILITOT 0.5 1.0  PROT 7.2 6.9  ALBUMIN 3.0* 2.8*   No results found for this basename: LIPASE, AMYLASE,  in the last 168 hours  Recent Labs Lab 06/27/13 0746  AMMONIA 17   CBC:  Recent Labs Lab 06/22/13 0215 06/22/13 0226 06/22/13 1603 06/27/13 0745  WBC 10.4  --  8.8 8.0  HGB 13.5 16.0 13.7 14.5  HCT 41.7 47.0 39.6 41.4  MCV 99.0  --  97.8 97.6  PLT 168  --  142* 151   Cardiac Enzymes: No results found for this basename: CKTOTAL, CKMB, CKMBINDEX,  TROPONINI,  in the last 168 hours BNP: No components found with this basename: POCBNP,  CBG: No results found for this basename: GLUCAP,  in the last 168 hours  Radiological Exams on Admission: Dg Chest 2 View  06/27/2013   *RADIOLOGY REPORT*  Clinical Data: Shortness of breath.  Rib fractures.  CHEST - 2 VIEW  Comparison: 06/24/2013  Findings: Left sided rib fractures, from the six the tenth ribs, without change.  No pneumothorax.  There is opacity at the left lung base that is likely atelectasis. This has mildly increased from the previous study.  The right lung remains clear.   The cardiac silhouette is normal in size and configuration.  No mediastinal or hilar  masses are noted.  IMPRESSION: Left lung base opacity has increased from the previous exam.  This is likely atelectasis.  No pneumothorax.  Stable left-sided rib fractures.   Original Report Authenticated By: Amie Portland, M.D.    EKG:   Time spent: 45 min  [facility] Pager [phone]  If 7PM-7AM, please contact night-coverage [email or web address] Password [facility] 06/27/2013, 10:51 AM

## 2013-06-27 NOTE — Progress Notes (Signed)
Patient ID: Zachary [name], male   DOB: 11-21-1941, 72 y.o.   MRN: [id number]   LOS: 5 days   Subjective: Difficult to arouse. Disoriented.   Objective: Vital signs in last 24 hours: Temp:  [97.5 F (36.4 C)-97.9 F (36.6 C)] 97.9 F (36.6 C) (07/25 0619) Pulse Rate:  [58-79] 58 (07/25 0619) Resp:  [18-20] 18 (07/25 0619) BP: (133-147)/(72-80) 136/80 mmHg (07/25 0619) SpO2:  [86 %-100 %] 95 % (07/25 0619) Last BM Date: 06/21/13   IS: Wouldn't cooperate   Physical Exam General appearance: alert and no distress Resp: clear to auscultation bilaterally and wet-sounding cough Cardio: regular rate and rhythm GI: normal findings: bowel sounds normal and soft, non-tender   Assessment/Plan: Fall  Left rib fxs -- Pulmonary toilet. Check CXR. EtOH -- CIWA, beer. Check NH3. Check UA to r/o infectious etiology of his confusion Multiple medical problems -- Home meds  FEN -- Bowel regimen VTE -- SCD's, Lovenox  Dispo -- PT/OT, pt lives with GF who can provide 24h support. We'll decide dispo on results of x-ray, labs, PT/OT today.   Freeman Caldron, PA-C Pager: [phone] General Trauma PA Pager: [phone]   06/27/2013

## 2013-06-27 NOTE — Progress Notes (Signed)
Patient not eating much at all.  Difficult management because he is not stable on his feet and cannot go home.  This patient has been seen and I agree with the findings and treatment plan.  Marta Lamas. Gae Bon, MD, FACS [phone] (pager) [phone] (direct pager) Trauma Surgeon

## 2013-06-27 NOTE — Progress Notes (Signed)
Occupational Therapy Treatment Patient Details Name: Zachary [name] MRN: [id number] DOB: 10-Sep-1941 Today's Date: 06/27/2013 Time: 7829-5621 OT Time Calculation (min): 38 min  OT Assessment / Plan / Recommendation  History of present illness This is a 72 year old male who was walking home and fell. He was cradling a 40 ounce beer under his left arm and states he fell onto this beer. He is complaining of moderate to severe pain in the left ribs.  CT shows fx's of L 6-9 rib.   Clinical Impression Pt with improving ability to assist with BADLs and functional mobility; however, he fatigues relatively quickly (02 sats 96% on 4L); pt demonstrates significant cognitive deficits including impaired attention; decreased safety/judgement, sequencing, and problem solving deficits.  Pt brushed face and beard with toothpaste and toothbrush with no awareness of error, and was unable to correct with max verbal cues.  Required physical assistance to correct.  He will require 24 hour close supervision/assist at discharge.    OT comments    Follow Up Recommendations  Home health OT;Supervision/Assistance - 24 hour    Barriers to Discharge       Equipment Recommendations  3 in 1 bedside comode    Recommendations for Other Services    Frequency Min 2X/week   Progress towards OT Goals Progress towards OT goals: Progressing toward goals  Plan Discharge plan remains appropriate    Precautions / Restrictions Precautions Precautions: Fall Restrictions Weight Bearing Restrictions: No   Pertinent Vitals/Pain     ADL  Grooming: Teeth care;Brushing hair;Denture care;Wash/dry hands;Wash/dry face;Minimal assistance;Moderate assistance Where Assessed - Grooming: Supported standing Toilet Transfer: Minimal Dentist Method: Sit to stand;Stand pivot Acupuncturist: Materials engineer and Hygiene: Moderate assistance Where Assessed - Toileting  Clothing Manipulation and Hygiene: Standing Equipment Used: Rolling walker;Other (comment) (02) Transfers/Ambulation Related to ADLs: Pt requires min A to ambulate with RW.  Tends to veer to Rt. requiring assist by therapist to correct ADL Comments: Pt. was working on brushing teeth, then proceeded to use toothbrush (with toothpaste) to brush his face and beard.  Pt required max verbal cues and physical assist to extinguish this behavior.  Mod A overall to brush teeth and comb hair due to cognitive deficits, min A to wash hands and face standing at sink    OT Diagnosis:    OT Problem List:   OT Treatment Interventions:     OT Goals(current goals can now be found in the care plan section) Acute Rehab OT Goals Patient Stated Goal: get out of here ADL Goals Pt Will Perform Grooming: with supervision;standing Pt Will Perform Lower Body Dressing: with supervision;sit to/from stand Pt Will Perform Tub/Shower Transfer: with supervision;ambulating Additional ADL Goal #1: Pt will complete all aspects of toileting with S Additional ADL Goal #2: Pt will answer simple safey/orientation questions with 100% accuracy.  Visit Information  Last OT Received On: 06/27/13 Assistance Needed: +2 PT/OT Co-Evaluation/Treatment: Yes History of Present Illness: This is a 72 year old male who was walking home and fell. He was cradling a 40 ounce beer under his left arm and states he fell onto this beer. He is complaining of moderate to severe pain in the left ribs.  CT shows fx's of L 6-9 rib.    Subjective Data      Prior Functioning       Cognition  Cognition Arousal/Alertness: Awake/alert Behavior During Therapy: Flat affect Overall Cognitive Status: No family/caregiver present to determine baseline cognitive functioning Area of Impairment: Attention;Following  commands;Safety/judgement;Awareness;Problem solving Orientation Level: Disoriented to;Time Current Attention Level: Sustained Memory: Decreased  recall of precautions Following Commands: Follows one step commands with increased time Safety/Judgement: Decreased awareness of safety;Decreased awareness of deficits Awareness: Intellectual Problem Solving: Slow processing;Difficulty sequencing;Requires verbal cues;Requires tactile cues General Comments: No family available to detrmine baseline level of functioning.  See comments under ADLs    Mobility  Bed Mobility Bed Mobility: Supine to Sit;Sitting - Scoot to Edge of Bed Supine to Sit: 4: Min assist Sitting - Scoot to [place] of Bed: 4: Min assist Details for Bed Mobility Assistance: assist to initiate and truncal assist Transfers Transfers: Sit to Stand;Stand to Sit Sit to Stand: 4: Min assist;With upper extremity assist;From bed;From chair/3-in-1 Stand to Sit: 4: Min assist;With upper extremity assist;To chair/3-in-1 Details for Transfer Assistance: vc/tc's for safe technique/hand placement.  steady assist    Exercises      Balance Balance Balance Assessed: Yes Static Sitting Balance Static Sitting - Balance Support: No upper extremity supported;Feet supported Static Sitting - Level of Assistance: 5: Stand by assistance Static Standing Balance Static Standing - Balance Support: Right upper extremity supported;Left upper extremity supported;No upper extremity supported Static Standing - Level of Assistance: 4: Min assist;5: Stand by assistance Static Standing - Comment/# of Minutes: 8 mins   End of Session OT - End of Session Equipment Utilized During Treatment: Rolling walker Activity Tolerance: Patient tolerated treatment well Patient left: in chair;with call bell/phone within reach;with chair alarm set;with nursing/sitter in room Nurse Communication: Mobility status  GO     Curtis Cain M 06/27/2013, 2:34 PM

## 2013-06-28 DIAGNOSIS — F1020 Alcohol dependence, uncomplicated: Secondary | ICD-10-CM

## 2013-06-28 MED ORDER — LORAZEPAM 1 MG PO TABS: 1.0000 mg | ORAL_TABLET | Freq: Four times a day (QID) | ORAL | Status: DC | PRN

## 2013-06-28 MED ORDER — NAPROXEN 500 MG PO TABS: 500.0000 mg | ORAL_TABLET | Freq: Two times a day (BID) | ORAL | Status: DC

## 2013-06-28 MED ORDER — TRAMADOL HCL 50 MG PO TABS: 50.0000 mg | ORAL_TABLET | Freq: Four times a day (QID) | ORAL | Status: DC | PRN

## 2013-06-28 NOTE — Progress Notes (Signed)
Voice message left for Ava [name]([phone]) concerning DC today.

## 2013-06-28 NOTE — Progress Notes (Signed)
Patient interviewed and examined. I agree with the assessment and treatment plan outlined by Earney Hamburg, PA   Angelia Mould. Derrell Lolling, M.D., [facility] General and Minimally invasive Surgery Breast and Colorectal Surgery Office:   [phone] Pager:   [phone]

## 2013-06-28 NOTE — Clinical Social Work Note (Signed)
Discharge plan for patient is home today with [facility] services. CSW signing off as no further SW services needed.  Genelle Bal, MSW, LCSW [phone]

## 2013-06-28 NOTE — Discharge Summary (Signed)
Patient interviewed and examined. I agree with the discharge summary and outpatient plans outlined by Earney Hamburg, PA   Angelia Mould. Derrell Lolling, M.D., [facility] General and Minimally invasive Surgery Breast and Colorectal Surgery Office:   [phone] Pager:   [phone]

## 2013-06-28 NOTE — Progress Notes (Signed)
My 2nd attempt to contact Zachary [name](girlfriend) [phone]. Voiced message left on answering machine.

## 2013-06-28 NOTE — Progress Notes (Signed)
Patient ID: Zachary [name], male   DOB: 1941/07/24, 72 y.o.   MRN: [id number]   LOS: 6 days   Subjective: No new c/o. Thinks he's ready to go home.   Objective: Vital signs in last 24 hours: Temp:  [97.6 F (36.4 C)-98.1 F (36.7 C)] 98.1 F (36.7 C) (07/26 0515) Pulse Rate:  [61-80] 80 (07/26 0515) Resp:  [18-20] 18 (07/26 0515) BP: (132-153)/(59-84) 132/76 mmHg (07/26 0515) SpO2:  [95 %-100 %] 97 % (07/26 0910) Last BM Date: 06/27/13   IS:   Physical Exam General appearance: alert and no distress Resp: clear to auscultation bilaterally Cardio: regular rate and rhythm GI: normal findings: bowel sounds normal and soft, non-tender   Assessment/Plan: Fall  Left rib fxs -- Pulmonary toilet.   EtOH -- CIWA, beer. No additional causes found for agitation/confusion. Improved today. Multiple medical problems -- Home meds  Dispo -- Will d/c home with HHPT/OT    Freeman Caldron, PA-C Pager: [phone] General Trauma PA Pager: [phone]   06/28/2013

## 2013-06-28 NOTE — Progress Notes (Signed)
Patient is being discharged. No ne could come get him. He will be going home by taxi service. His girlfriend, AVA, has been notified and is aware that patient is coming home by taxi, has instructions, medications to pick up at the pharmacy, and a 3 in one chair. Discharge instructions were gone over with the patient. Home medications gone over. Follow up appointment is to be made. Prescriptions were faxed to patient's pharmacy. Patient  verbalized understanding of the instructions. I encouraged Ava, to read the instructions because she will be his caregiver.

## 2013-06-28 NOTE — Progress Notes (Signed)
Plans noted for discharge.  Subjective: No dyspnea. No tremulousness  Objective: Vital signs in last 24 hours: Temp:  [97.4 F (36.3 C)-98.1 F (36.7 C)] 97.4 F (36.3 C) (07/26 1419) Pulse Rate:  [61-80] 64 (07/26 1419) Resp:  [18-20] 20 (07/26 1419) BP: (132-153)/(59-81) 146/81 mmHg (07/26 1419) SpO2:  [90 %-100 %] 96 % (07/26 1430) Weight change:  Last BM Date: 06/27/13  Intake/Output from previous day: 07/25 0701 - 07/26 0700 In: 360 [P.O.:360] Out: 350 [Urine:350] Intake/Output this shift: Total I/O In: 0  Out: 325 [Urine:325]  Gen: calm. Cooperative Lungs CTA without WRR CV RRR without MGR Ext: no edema Neuro: oriented and appropriate. tremulous  Lab Results:  Recent Labs  06/27/13 0745  WBC 8.0  HGB 14.5  HCT 41.4  PLT 151   BMET  Recent Labs  06/27/13 0745  NA 137  K 3.5  CL 99  CO2 32  GLUCOSE 88  BUN 9  CREATININE 0.79  CALCIUM 9.4    Studies/Results: Dg Chest 2 View  06/27/2013   *RADIOLOGY REPORT*  Clinical Data: Shortness of breath.  Rib fractures.  CHEST - 2 VIEW  Comparison: 06/24/2013  Findings: Left sided rib fractures, from the six the tenth ribs, without change.  No pneumothorax.  There is opacity at the left lung base that is likely atelectasis. This has mildly increased from the previous study.  The right lung remains clear.   The cardiac silhouette is normal in size and configuration.  No mediastinal or hilar masses are noted.  IMPRESSION: Left lung base opacity has increased from the previous exam.  This is likely atelectasis.  No pneumothorax.  Stable left-sided rib fractures.   Original Report Authenticated By: Amie Portland, M.D.    Assessment/Plan: Stable for discharge   LOS: 6 days   Zachary [name] L 06/28/2013, 5:26 PM

## 2013-06-28 NOTE — Discharge Summary (Signed)
Physician Discharge Summary  Patient ID: Zachary [name] MRN: [id number] DOB/AGE: Feb 18, 1941 72 y.o.  Admit date: 06/22/2013 Discharge date: 06/28/2013  Discharge Diagnoses Patient Active Problem List   Diagnosis Date Noted  . Fall 06/23/2013  . Multiple fractures of ribs of left side 06/23/2013  . Alcohol dependence with acute alcoholic intoxication 02/26/2012    Class: Acute  . Alcohol dependence 10/17/2011  . HEPATITIS B 12/01/2008  . HYPERTENSION 12/01/2008  . COPD UNSPECIFIED 12/01/2008  . HEPATIC CIRRHOSIS, ALCOHOLIC 12/01/2008  . ARTHRITIS 12/01/2008  . HERNIA, HX OF 12/01/2008    Consultants Dr. Kathlen Mody for internal medicine   Procedures None   HPI: Zachary [name] went to grocery store to buy a 40oz beer. He drinks about 2 40's a day. He fell walking out and the 40 bottle hit him in the left chest fracturing multiple ribs. He smokes. He was admitted for pain control and pulmonary toilet.   Hospital Course: The patient did not suffer any respiratory complications from his rib fractures. His pain was controlled on oral medications and his chronic medications were continued without any exacerbations of those conditions. He quickly showed evidence of alcohol withdrawal despite initiation of the [facility] protocol on admission. To complement this he was started on 3 beers per day which had to be increased to 6 due to continued symptoms. His agitation and confusion waxed and waned over the course of his hospital stay. Investigation into other etiologies were fruitless and internal medicine was consulted for their opinion. They, too, thought that this was alcohol related. He was doing well from that standpoint on the day of discharge with intact orientation. He was discharged home in stable condition.      Medication List         albuterol 108 (90 BASE) MCG/ACT inhaler  Commonly known as:  PROVENTIL HFA;VENTOLIN HFA  Inhale 2 puffs into the lungs every 4 (four) hours as needed  for wheezing or shortness of breath.     gabapentin 100 MG capsule  Commonly known as:  NEURONTIN  Take 1 capsule (100 mg total) by mouth 3 (three) times daily. For anxiety     hydrOXYzine 25 MG tablet  Commonly known as:  ATARAX/VISTARIL  Take 1 tablet (25 mg total) by mouth every 6 (six) hours as needed for anxiety.     LORazepam 1 MG tablet  Commonly known as:  ATIVAN  Take 1 tablet (1 mg total) by mouth every 6 (six) hours as needed for anxiety.     metoprolol tartrate 25 MG tablet  Commonly known as:  LOPRESSOR  Take 1 tablet (25 mg total) by mouth 2 (two) times daily. For hypertension     naproxen 500 MG tablet  Commonly known as:  NAPROSYN  Take 1 tablet (500 mg total) by mouth 2 (two) times daily with a meal.     traMADol 50 MG tablet  Commonly known as:  ULTRAM  Take 1-2 tablets (50-100 mg total) by mouth every 6 (six) hours as needed (50mg  for mild pain, 75mg  for moderate pain, 100mg  for severe pain).     traZODone 50 MG tablet  Commonly known as:  DESYREL  Take 1 tablet (50 mg total) by mouth at bedtime as needed and may repeat dose one time if needed for sleep.             Follow-up Information   Schedule an appointment as soon as possible for a visit with Primary Care Physician.  Call [facility]. (As needed)    Contact information:   [address] [place] [address] [phone]       Signed: Freeman Caldron, PA-C Pager: [phone] General Trauma PA Pager: [phone]  06/28/2013, 9:35 AM

## 2013-06-28 NOTE — Progress Notes (Signed)
   CARE MANAGEMENT NOTE 06/28/2013  Patient:  Zachary [name],Zachary [name]   Account Number:  [id number]  Date Initiated:  06/26/2013  Documentation initiated by:  Carlyle Lipa  Subjective/Objective Assessment:   fall with rib fx     Action/Plan:   home with girlfriend to assist; potential need for O2   Anticipated DC Date:  06/27/2013   Anticipated DC Plan:  HOME W HOME HEALTH SERVICES      DC Planning Services  CM consult      [facility] Choice  HOME HEALTH   Choice offered to / List presented to:  C-1 Patient   DME arranged  3-N-1      DME agency  [facility]     HH arranged  HH-2 PT  HH-3 OT      [facility] agency  [facility]   Status of service:  Completed, signed off Medicare Important Message given?   (If response is "NO", the following Medicare IM given date fields will be blank) Date Medicare IM given:   Date Additional Medicare IM given:    Discharge Disposition:  HOME W HOME HEALTH SERVICES  Per UR Regulation:  Reviewed for med. necessity/level of care/duration of stay  If discussed at Long Length of Stay Meetings, dates discussed:    Comments:  06/28/2013 1030 NCM spoke to pt and states no preference for [facility]. States he has RW at home and requesting 3n1 bedside commode for home. NCM attempted to contact girlfriend, Wende Crease # [phone] for needs. Left message on phone for return call. Isidoro Donning RN CCM Case Mgmt phone [phone]

## 2013-06-30 ENCOUNTER — Emergency Department (HOSPITAL_COMMUNITY): Payer: Medicare Other

## 2013-06-30 ENCOUNTER — Inpatient Hospital Stay (HOSPITAL_COMMUNITY)
Admission: EM | Admit: 2013-06-30 | Discharge: 2013-07-14 | DRG: 199 | Disposition: A | Discharge status: Skilled Nursing Facility | Payer: Medicare Other | Attending: General Surgery | Admitting: General Surgery

## 2013-06-30 ENCOUNTER — Encounter (HOSPITAL_COMMUNITY): Payer: Self-pay | Admitting: *Deleted

## 2013-06-30 DIAGNOSIS — F1020 Alcohol dependence, uncomplicated: Secondary | ICD-10-CM | POA: Diagnosis present

## 2013-06-30 DIAGNOSIS — S271XXA Traumatic hemothorax, initial encounter: Secondary | ICD-10-CM

## 2013-06-30 DIAGNOSIS — M129 Arthropathy, unspecified: Secondary | ICD-10-CM | POA: Diagnosis present

## 2013-06-30 DIAGNOSIS — S2249XA Multiple fractures of ribs, unspecified side, initial encounter for closed fracture: Secondary | ICD-10-CM | POA: Diagnosis present

## 2013-06-30 DIAGNOSIS — J9819 Other pulmonary collapse: Secondary | ICD-10-CM | POA: Diagnosis present

## 2013-06-30 DIAGNOSIS — I4891 Unspecified atrial fibrillation: Secondary | ICD-10-CM | POA: Diagnosis not present

## 2013-06-30 DIAGNOSIS — I498 Other specified cardiac arrhythmias: Secondary | ICD-10-CM | POA: Diagnosis not present

## 2013-06-30 DIAGNOSIS — F10221 Alcohol dependence with intoxication delirium: Secondary | ICD-10-CM

## 2013-06-30 DIAGNOSIS — S2232XS Fracture of one rib, left side, sequela: Secondary | ICD-10-CM

## 2013-06-30 DIAGNOSIS — I2510 Atherosclerotic heart disease of native coronary artery without angina pectoris: Secondary | ICD-10-CM | POA: Diagnosis present

## 2013-06-30 DIAGNOSIS — J449 Chronic obstructive pulmonary disease, unspecified: Secondary | ICD-10-CM | POA: Diagnosis present

## 2013-06-30 DIAGNOSIS — Z9181 History of falling: Secondary | ICD-10-CM

## 2013-06-30 DIAGNOSIS — J189 Pneumonia, unspecified organism: Secondary | ICD-10-CM | POA: Diagnosis present

## 2013-06-30 DIAGNOSIS — W19XXXS Unspecified fall, sequela: Secondary | ICD-10-CM

## 2013-06-30 DIAGNOSIS — J942 Hemothorax: Secondary | ICD-10-CM

## 2013-06-30 DIAGNOSIS — E43 Unspecified severe protein-calorie malnutrition: Secondary | ICD-10-CM | POA: Diagnosis present

## 2013-06-30 DIAGNOSIS — R0989 Other specified symptoms and signs involving the circulatory and respiratory systems: Secondary | ICD-10-CM | POA: Diagnosis present

## 2013-06-30 DIAGNOSIS — J4489 Other specified chronic obstructive pulmonary disease: Secondary | ICD-10-CM | POA: Diagnosis present

## 2013-06-30 DIAGNOSIS — E785 Hyperlipidemia, unspecified: Secondary | ICD-10-CM | POA: Diagnosis present

## 2013-06-30 DIAGNOSIS — IMO0002 Reserved for concepts with insufficient information to code with codable children: Secondary | ICD-10-CM

## 2013-06-30 DIAGNOSIS — R64 Cachexia: Secondary | ICD-10-CM | POA: Diagnosis present

## 2013-06-30 DIAGNOSIS — W19XXXA Unspecified fall, initial encounter: Secondary | ICD-10-CM | POA: Diagnosis present

## 2013-06-30 DIAGNOSIS — R0609 Other forms of dyspnea: Secondary | ICD-10-CM | POA: Diagnosis present

## 2013-06-30 DIAGNOSIS — D62 Acute posthemorrhagic anemia: Secondary | ICD-10-CM | POA: Diagnosis present

## 2013-06-30 DIAGNOSIS — F17200 Nicotine dependence, unspecified, uncomplicated: Secondary | ICD-10-CM | POA: Diagnosis present

## 2013-06-30 DIAGNOSIS — I10 Essential (primary) hypertension: Secondary | ICD-10-CM | POA: Diagnosis present

## 2013-06-30 DIAGNOSIS — S2242XA Multiple fractures of ribs, left side, initial encounter for closed fracture: Secondary | ICD-10-CM

## 2013-06-30 LAB — TROPONIN I: Troponin I: <0.3 ng/mL (ref <0.30)

## 2013-06-30 LAB — CBC WITH DIFFERENTIAL/PLATELET
Basophils Absolute: 0 10*3/uL (ref 0.0–0.1)
Basophils Relative: 0 % (ref 0–1)
Eosinophils Absolute: 0 10*3/uL (ref 0.0–0.7)
Eosinophils Relative: 0 % (ref 0–5)
Lymphocytes Relative: 13 % (ref 12–46)
MCH: 33.4 pg (ref 26.0–34.0)
MCHC: 34.5 g/dL (ref 30.0–36.0)
MCV: 97 fL (ref 78.0–100.0)
Platelets: 302 10*3/uL (ref 150–400)
RDW: 13.4 % (ref 11.5–15.5)
WBC: 10.5 10*3/uL (ref 4.0–10.5)

## 2013-06-30 LAB — COMPREHENSIVE METABOLIC PANEL
BUN: 27 mg/dL — ABNORMAL HIGH (ref 6–23)
CO2: 23 mEq/L (ref 19–32)
Calcium: 8.4 mg/dL (ref 8.4–10.5)
Creatinine, Ser: 1.22 mg/dL (ref 0.50–1.35)
GFR calc Af Amer: 68 mL/min — ABNORMAL LOW (ref >90)
GFR calc non Af Amer: 58 mL/min — ABNORMAL LOW (ref >90)
Glucose, Bld: 232 mg/dL — ABNORMAL HIGH (ref 70–99)
Total Protein: 6.6 g/dL (ref 6.0–8.3)

## 2013-06-30 LAB — TYPE AND SCREEN: ABO/RH(D): O POS

## 2013-06-30 MED ORDER — DOCUSATE SODIUM 100 MG PO CAPS
100.0000 mg | ORAL_CAPSULE | Freq: Two times a day (BID) | ORAL | Status: DC
  Administered 2013-07-01 – 2013-07-09 (×17): 100 mg via ORAL
  Filled 2013-06-30 (×14): qty 1

## 2013-06-30 MED ORDER — THIAMINE HCL 100 MG/ML IJ SOLN
100.0000 mg | Freq: Every day | INTRAMUSCULAR | Status: DC
  Filled 2013-06-30: qty 1

## 2013-06-30 MED ORDER — IOHEXOL 300 MG/ML  SOLN
80.0000 mL | Freq: Once | INTRAMUSCULAR | Status: AC | PRN
  Administered 2013-06-30: 80 mL via INTRAVENOUS

## 2013-06-30 MED ORDER — BISACODYL 10 MG RE SUPP
10.0000 mg | Freq: Every day | RECTAL | Status: DC | PRN
  Administered 2013-07-04: 10 mg via RECTAL
  Filled 2013-06-30: qty 1

## 2013-06-30 MED ORDER — METOPROLOL TARTRATE 25 MG PO TABS
25.0000 mg | ORAL_TABLET | Freq: Two times a day (BID) | ORAL | Status: DC
  Administered 2013-07-01 – 2013-07-11 (×22): 25 mg via ORAL
  Filled 2013-06-30 (×24): qty 1

## 2013-06-30 MED ORDER — MIDAZOLAM HCL 2 MG/2ML IJ SOLN: 4.0000 mg | Freq: Once | INTRAMUSCULAR | Status: AC

## 2013-06-30 MED ORDER — SODIUM CHLORIDE 0.9 % IV BOLUS (SEPSIS)
2000.0000 mL | Freq: Once | INTRAVENOUS | Status: AC
  Administered 2013-06-30: 2000 mL via INTRAVENOUS

## 2013-06-30 MED ORDER — PANTOPRAZOLE SODIUM 40 MG PO TBEC: 40.0000 mg | DELAYED_RELEASE_TABLET | Freq: Every day | ORAL | Status: DC

## 2013-06-30 MED ORDER — LORAZEPAM 1 MG PO TABS: 1.0000 mg | ORAL_TABLET | Freq: Four times a day (QID) | ORAL | Status: AC | PRN

## 2013-06-30 MED ORDER — ADULT MULTIVITAMIN W/MINERALS CH
1.0000 | ORAL_TABLET | Freq: Every day | ORAL | Status: DC
  Administered 2013-07-01 – 2013-07-14 (×13): 1 via ORAL
  Filled 2013-06-30 (×14): qty 1

## 2013-06-30 MED ORDER — PANTOPRAZOLE SODIUM 40 MG IV SOLR
40.0000 mg | Freq: Every day | INTRAVENOUS | Status: DC
  Filled 2013-06-30: qty 40

## 2013-06-30 MED ORDER — MORPHINE SULFATE 2 MG/ML IJ SOLN
1.0000 mg | INTRAMUSCULAR | Status: DC | PRN
  Administered 2013-07-01 (×2): 2 mg via INTRAVENOUS
  Administered 2013-07-01 – 2013-07-02 (×3): 4 mg via INTRAVENOUS
  Filled 2013-06-30: qty 2
  Filled 2013-06-30 (×2): qty 1
  Filled 2013-06-30: qty 2

## 2013-06-30 MED ORDER — MIDAZOLAM HCL 2 MG/2ML IJ SOLN
INTRAMUSCULAR | Status: AC
  Administered 2013-06-30: 4 mg via INTRAVENOUS
  Filled 2013-06-30: qty 6

## 2013-06-30 MED ORDER — LORAZEPAM 2 MG/ML IJ SOLN
1.0000 mg | Freq: Four times a day (QID) | INTRAMUSCULAR | Status: AC | PRN
  Administered 2013-07-01 – 2013-07-03 (×5): 1 mg via INTRAVENOUS
  Filled 2013-06-30 (×9): qty 1

## 2013-06-30 MED ORDER — ONDANSETRON HCL 4 MG PO TABS: 4.0000 mg | ORAL_TABLET | Freq: Four times a day (QID) | ORAL | Status: DC | PRN

## 2013-06-30 MED ORDER — ONDANSETRON HCL 4 MG/2ML IJ SOLN: 4.0000 mg | Freq: Four times a day (QID) | INTRAMUSCULAR | Status: DC | PRN

## 2013-06-30 MED ORDER — ALBUTEROL SULFATE HFA 108 (90 BASE) MCG/ACT IN AERS
2.0000 | INHALATION_SPRAY | Freq: Four times a day (QID) | RESPIRATORY_TRACT | Status: DC | PRN
  Administered 2013-07-03 – 2013-07-06 (×7): 2 via RESPIRATORY_TRACT
  Filled 2013-06-30 (×2): qty 6.7

## 2013-06-30 MED ORDER — VITAMIN B-1 100 MG PO TABS
100.0000 mg | ORAL_TABLET | Freq: Every day | ORAL | Status: DC
  Administered 2013-07-01 – 2013-07-14 (×13): 100 mg via ORAL
  Filled 2013-06-30 (×14): qty 1

## 2013-06-30 MED ORDER — ENOXAPARIN SODIUM 40 MG/0.4ML ~~LOC~~ SOLN
40.0000 mg | SUBCUTANEOUS | Status: DC
  Administered 2013-07-01 – 2013-07-08 (×8): 40 mg via SUBCUTANEOUS
  Filled 2013-06-30 (×10): qty 0.4

## 2013-06-30 MED ORDER — ONDANSETRON HCL 4 MG/2ML IJ SOLN
4.0000 mg | Freq: Once | INTRAMUSCULAR | Status: AC
  Administered 2013-06-30: 4 mg via INTRAVENOUS

## 2013-06-30 MED ORDER — THIAMINE HCL 100 MG/ML IJ SOLN
Freq: Once | INTRAVENOUS | Status: AC
  Administered 2013-07-01: via INTRAVENOUS
  Filled 2013-06-30: qty 1000

## 2013-06-30 MED ORDER — GABAPENTIN 100 MG PO CAPS
100.0000 mg | ORAL_CAPSULE | Freq: Three times a day (TID) | ORAL | Status: DC
  Administered 2013-07-01 – 2013-07-14 (×40): 100 mg via ORAL
  Filled 2013-06-30 (×43): qty 1

## 2013-06-30 MED ORDER — ONDANSETRON HCL 4 MG/2ML IJ SOLN
INTRAMUSCULAR | Status: AC
  Filled 2013-06-30: qty 2

## 2013-06-30 MED ORDER — FENTANYL CITRATE 0.05 MG/ML IJ SOLN
300.0000 ug | Freq: Once | INTRAMUSCULAR | Status: AC
  Administered 2013-06-30: 100 ug via INTRAVENOUS

## 2013-06-30 MED ORDER — ALBUTEROL SULFATE (5 MG/ML) 0.5% IN NEBU
5.0000 mg | INHALATION_SOLUTION | Freq: Once | RESPIRATORY_TRACT | Status: AC
  Administered 2013-06-30: 5 mg via RESPIRATORY_TRACT
  Filled 2013-06-30: qty 1

## 2013-06-30 MED ORDER — FENTANYL CITRATE 0.05 MG/ML IJ SOLN
INTRAMUSCULAR | Status: AC | PRN
  Administered 2013-06-30: 50 ug via INTRAVENOUS
  Administered 2013-06-30: 100 ug via INTRAVENOUS

## 2013-06-30 MED ORDER — FOLIC ACID 1 MG PO TABS
1.0000 mg | ORAL_TABLET | Freq: Every day | ORAL | Status: DC
  Administered 2013-07-01 – 2013-07-14 (×13): 1 mg via ORAL
  Filled 2013-06-30 (×14): qty 1

## 2013-06-30 MED ORDER — FENTANYL CITRATE 0.05 MG/ML IJ SOLN
INTRAMUSCULAR | Status: AC
  Filled 2013-06-30: qty 6

## 2013-06-30 MED ORDER — FENTANYL CITRATE 0.05 MG/ML IJ SOLN
50.0000 ug | Freq: Once | INTRAMUSCULAR | Status: AC
  Administered 2013-06-30: 50 ug via INTRAVENOUS
  Filled 2013-06-30: qty 2

## 2013-06-30 NOTE — Progress Notes (Signed)
Patient ID: Zachary [name], male   DOB: 30-Sep-1941, 72 y.o.   MRN: [id number] BP 141/77  Pulse 95  Temp(Src) 98.4 F (36.9 C) (Oral)  Resp 19  SpO2 95%   Chest Tube Insertion and IV conscious sedation (30 minutes) Procedure Note  Indications:  Clinically significant left Hemothorax  Pre-operative Diagnosis: Left Hemothorax  Post-operative Diagnosis: Left Hemothorax  Anesthesia: 4mg  IV versed, 250 microgram IV fentanyl   Procedure Details  Informed consent was obtained for the procedure, including sedation.  Risks of lung perforation, hemorrhage, arrhythmia, and adverse drug reaction were discussed. The patient was monitored continuously with EKG, pulse ox, BP.   After sterile skin prep and time out performed, 8cc 1% lidocaine infiltrated in left anterior axillary space at level of nipple. A 3cm transverse incision was made.  using standard technique, a 36 French tube was placed in the left anterior 4th rib space with a large rush of old blood. The tube was secured at 14cm and suture in place with two 0-silk sutures. A sterile dressing was applied. CXR pending. Pt tolerated without complication.   Findings: 200cc old blood spilled onto bed; 1L old blood into pleuravac. No air leak;   Estimated Blood Loss:  Minimal         Specimens:  None              Complications:  None; patient tolerated the procedure well.         Disposition: ED then SDU         Condition: stable  Attending Attestation: I performed the procedure.  Mary Sella. Andrey Campanile, MD, FACS General, Bariatric, & Minimally Invasive Surgery [facility], [place]

## 2013-06-30 NOTE — ED Provider Notes (Signed)
CSN: [id number]     Arrival date & time 06/30/13  1601 History     First MD Initiated Contact with Patient 06/30/13 1614     Chief Complaint  Patient presents with  . Shortness of Breath   (Consider location/radiation/quality/duration/timing/severity/associated sxs/prior Treatment) Patient is a 72 y.o. male presenting with shortness of breath.  Shortness of Breath  Pt with numerous medical problems as well as EtOH abuse and homelessness brought to the ED by the daughter of his friend 'Ava'. He was admitted to Trauma service about a week ago after a fall with multiple rib fractures during which time he also was in EtOH withdrawal/delirium. He was discharged 2 days into the care of 'Ava' with Home Health to assess him as well although it isn't clear if [facility] services have yet been initiated. Friend with him today states his breathing has been getting worse and worse since discharge. He is complaining of severe L sided rib pain, SOB, wheezing and cough but no fever. He denies any new falls has not been drinking since discharge although he continues to smoke.   Past Medical History  Diagnosis Date  . Hypertension   . Coronary artery disease   . Hyperlipemia   . Angina   . Shortness of breath   . Mental disorder   . Arthritis   . COPD (chronic obstructive pulmonary disease)   . Hiatal hernia   . Depression   . Gout   . Joint pain   . Depression   . Anxiety    Past Surgical History  Procedure Laterality Date  . Abdominal surgery     History reviewed. No pertinent family history. History  Substance Use Topics  . Smoking status: Current Every Day Smoker -- 1.00 packs/day for 15 years    Types: Cigarettes  . Smokeless tobacco: Former Neurosurgeon  . Alcohol Use: 7.2 oz/week    12 Cans of beer per week     Comment: pt heavy drinker, pt stets at lest 2-3 40oz per day    Review of Systems  Respiratory: Positive for shortness of breath.    All other systems reviewed and are negative except  as noted in HPI.   Allergies  Review of patient's allergies indicates no known allergies.  Home Medications   Current Outpatient Rx  Name  Route  Sig  Dispense  Refill  . albuterol (PROVENTIL HFA;VENTOLIN HFA) 108 (90 BASE) MCG/ACT inhaler   Inhalation   Inhale 2 puffs into the lungs every 6 (six) hours as needed for wheezing.         . gabapentin (NEURONTIN) 100 MG capsule   Oral   Take 100 mg by mouth 3 (three) times daily.         Marland Kitchen LORazepam (ATIVAN) 1 MG tablet   Oral   Take 1 mg by mouth every 6 (six) hours as needed for anxiety.         . metoprolol tartrate (LOPRESSOR) 25 MG tablet   Oral   Take 25 mg by mouth 2 (two) times daily.         . naproxen (NAPROSYN) 500 MG tablet   Oral   Take 500 mg by mouth 2 (two) times daily with a meal.         . traMADol (ULTRAM) 50 MG tablet   Oral   Take 50-100 mg by mouth every 6 (six) hours as needed for pain.         . traZODone (DESYREL) 50  MG tablet   Oral   Take 50-100 mg by mouth at bedtime as needed for sleep. *takes an extra 50mg  if need help sleeping          BP 161/87  Pulse 130  Temp(Src) 98.4 F (36.9 C) (Oral)  Resp 38  SpO2 96% Physical Exam  Nursing note and vitals reviewed. Constitutional: He is oriented to person, place, and time. He appears well-developed and well-nourished.  HENT:  Head: Normocephalic and atraumatic.  Eyes: EOM are normal. Pupils are equal, round, and reactive to light.  Neck: Normal range of motion. Neck supple.  Cardiovascular: Normal rate, normal heart sounds and intact distal pulses.   Pulmonary/Chest: He is in respiratory distress. He has wheezes.  Retractions, wheezes  Abdominal: Bowel sounds are normal. He exhibits mass (large non-tender ventral hernia). He exhibits no distension. There is no tenderness.  Musculoskeletal: Normal range of motion. He exhibits no edema and no tenderness.  Neurological: He is alert and oriented to person, place, and time. He has  normal strength. No cranial nerve deficit or sensory deficit.  Skin: Skin is warm and dry. No rash noted.  Psychiatric: He has a normal mood and affect.    ED Course   Procedures (including critical care time)  Labs Reviewed  CBC WITH DIFFERENTIAL - Abnormal; Notable for the following:    RBC 2.96 (*)    Hemoglobin 9.9 (*)    HCT 28.7 (*)    Neutro Abs 7.9 (*)    Monocytes Absolute 1.2 (*)    All other components within normal limits  COMPREHENSIVE METABOLIC PANEL - Abnormal; Notable for the following:    Glucose, Bld 232 (*)    BUN 27 (*)    Albumin 2.6 (*)    AST 52 (*)    GFR calc non Af Amer 58 (*)    GFR calc Af Amer 68 (*)    All other components within normal limits  OCCULT BLOOD, POC DEVICE - Abnormal; Notable for the following:    Fecal Occult Bld POSITIVE (*)    All other components within normal limits  TROPONIN I  PROTIME-INR  OCCULT BLOOD X 1 CARD TO LAB, STOOL  TYPE AND SCREEN   Dg Chest 2 View  06/30/2013   *RADIOLOGY REPORT*  Clinical Data: Shortness of breath, wheezing and hypoxia.  Recent fall with multiple left-sided rib fractures.  CHEST - 2 VIEW  Comparison: 06/27/2013 chest x-ray and CT of the chest on 06/22/2013.  Findings: No pneumothorax is identified.  There is further consolidation and atelectasis of the left lower lung with probable component of at least a small amount of left pleural fluid. Several left-sided rib fractures are again noted which do not demonstrate significant displacement by x-ray.  The heart is mildly enlarged.  No pulmonary edema is identified.  IMPRESSION: Progressive consolidation and atelectasis of the left lower lung with probable component of pleural fluid.  No pneumothorax is identified.   Original Report Authenticated By: Irish Lack, M.D.   Ct Chest W Contrast  06/30/2013   *RADIOLOGY REPORT*  Clinical Data:  Recent fall with left-sided rib fractures. Worsening hypoxia, shortness of breath and pain.  CT CHEST, ABDOMEN AND  PELVIS WITH CONTRAST  Technique:  Multidetector CT imaging of the chest, abdomen and pelvis was performed following the standard protocol during bolus administration of intravenous contrast.  Contrast: 80mL OMNIPAQUE IOHEXOL 300 MG/ML  SOLN  Comparison:  Chest x-ray earlier today and CT of the chest on 06/22/2013.  CT CHEST  Findings:  The the patient has developed a moderate to large left- sided hemothorax with high density irregular blood present in the inferior aspect of the pleural fluid collection.  This is adjacent to the multiple mildly displaced fractures on the left involving the sixth, seventh, eighth, ninth and tenth ribs.  All of the fracture shows slightly increased displacement compared to the prior CT.  There is no evidence of pneumothorax.  Compressive atelectasis of the lung is identified adjacent to the hemothorax.  No pulmonary edema is identified.  Incidental coronary atherosclerosis with calcified plaque present in a three-vessel distribution.  IMPRESSION: Development of large left hemothorax with high density blood and pleural fluid identified adjacent to multiple rib fractures.  The sixth through tenth ribs show fractures which all show slightly increased displacement since the prior CT.  CT ABDOMEN AND PELVIS  Findings:  There is no evidence of injury involving the solid organs of the abdomen.  A few cysts are present in the liver which have a benign appearance.  There are two densely calcified gallstones in the gallbladder lumen.  There are a multitude of ventral hernia defects anteriorly located superior to the umbilicus.  At least six ventral hernia defects are identified.  One contains a short segment of the transverse colon and another contains a different portion of the transverse colon. There is no evidence of incarceration.  The other small hernia defects contain fat.  There also is a right inguinal hernia containing fat.  No masses or enlarged lymph nodes are seen.  Aorta and iliac  arteries are heavily calcified without evidence of aneurysm.  No abnormal fluid collections are seen.  No masses or enlarged lymph nodes.  IMPRESSION: No acute injuries are identified in the abdomen or pelvis.  A number of anterior ventral hernia defects are seen without evidence of strangulation.  A right inguinal hernia is also present.   Original Report Authenticated By: Irish Lack, M.D.   Ct Abdomen Pelvis W Contrast  06/30/2013   *RADIOLOGY REPORT*  Clinical Data:  Recent fall with left-sided rib fractures. Worsening hypoxia, shortness of breath and pain.  CT CHEST, ABDOMEN AND PELVIS WITH CONTRAST  Technique:  Multidetector CT imaging of the chest, abdomen and pelvis was performed following the standard protocol during bolus administration of intravenous contrast.  Contrast: 80mL OMNIPAQUE IOHEXOL 300 MG/ML  SOLN  Comparison:  Chest x-ray earlier today and CT of the chest on 06/22/2013.  CT CHEST  Findings:  The the patient has developed a moderate to large left- sided hemothorax with high density irregular blood present in the inferior aspect of the pleural fluid collection.  This is adjacent to the multiple mildly displaced fractures on the left involving the sixth, seventh, eighth, ninth and tenth ribs.  All of the fracture shows slightly increased displacement compared to the prior CT.  There is no evidence of pneumothorax.  Compressive atelectasis of the lung is identified adjacent to the hemothorax.  No pulmonary edema is identified.  Incidental coronary atherosclerosis with calcified plaque present in a three-vessel distribution.  IMPRESSION: Development of large left hemothorax with high density blood and pleural fluid identified adjacent to multiple rib fractures.  The sixth through tenth ribs show fractures which all show slightly increased displacement since the prior CT.  CT ABDOMEN AND PELVIS  Findings:  There is no evidence of injury involving the solid organs of the abdomen.  A few cysts  are present in the liver which have a benign  appearance.  There are two densely calcified gallstones in the gallbladder lumen.  There are a multitude of ventral hernia defects anteriorly located superior to the umbilicus.  At least six ventral hernia defects are identified.  One contains a short segment of the transverse colon and another contains a different portion of the transverse colon. There is no evidence of incarceration.  The other small hernia defects contain fat.  There also is a right inguinal hernia containing fat.  No masses or enlarged lymph nodes are seen.  Aorta and iliac arteries are heavily calcified without evidence of aneurysm.  No abnormal fluid collections are seen.  No masses or enlarged lymph nodes.  IMPRESSION: No acute injuries are identified in the abdomen or pelvis.  A number of anterior ventral hernia defects are seen without evidence of strangulation.  A right inguinal hernia is also present.   Original Report Authenticated By: Irish Lack, M.D.   1. Hemothorax on left   2. Rib fracture, left, sequela     MDM   Date: 06/30/2013  Rate: 121  Rhythm: sinus tachycardia  QRS Axis: normal  Intervals: normal  ST/T Wave abnormalities: nonspecific T wave changes  Conduction Disutrbances:none  Narrative Interpretation:   Old EKG Reviewed: none available   Marked drop in Hgb since recent discharge. Stool is heme positive but brown. Sent for CT which shows large hemothorax as above. No intraabdominal injury. He is still tachypneic but HR improved and no hypotension. No coagulopathy. Discussed with Dr. Andrey Campanile, on call for Trauma who will see the patient in the ED. Moved to TRAB for expected chest tube placement. Pt informed of this plan. Pain medications given. Second IV line to be started pending procedure.     Charles B. Bernette Mayers, MD 07/01/13 1332

## 2013-06-30 NOTE — ED Notes (Signed)
Pt in c/o shortness of breath since last night, pt was admitted to the hospital recently after a fall and had 5 broken ribs, patient was discharged home a Saturday, pt family friend is here with him and states she has been trying to take care of him but has been unable to, states he has been breathing like this since last night, pt with retractions noted, grunting, unable to speak complete words

## 2013-06-30 NOTE — ED Notes (Signed)
PT AO x 4. Pt reports decrease in SOB. Chest tube site assessed, no sides of leaking or bleeding. Pt reports 3/10 left sided pain. VSS. Pt 100% on 2L.

## 2013-06-30 NOTE — H&P (Signed)
History   Zachary [name] is an 72 y.o. male.   Chief Complaint:  Chief Complaint  Patient presents with  . Shortness of Breath    Shortness of Breath Severity:  Moderate Onset quality:  Gradual Duration:  1 day Timing:  Constant Progression:  Worsening Chronicity:  New Relieved by:  Nothing Worsened by:  Movement Associated symptoms: chest pain, diaphoresis and wheezing   Associated symptoms: no abdominal pain, no fever, no headaches, no sputum production, no syncope and no vomiting     HPI: 72 yo WM recently on trauma service from 7/20-7/26 after sustaining a ground level fall resulting in multiple left rib fractures. He was admitted. He developed signs of alcohol withdrawal despite being on CIWA protocol and was given beers as well which controlled his symptoms. He was brought back to ED b/c of worsening SOB and difficulty breathing. CXR and CT demonstrated large left hemothorax and ABL anemia. According to roommate pt has not had any alcohol since discharge from hospital   Past Medical History  Diagnosis Date  . Hypertension   . Coronary artery disease   . Hyperlipemia   . Angina   . Shortness of breath   . Mental disorder   . Arthritis   . COPD (chronic obstructive pulmonary disease)   . Hiatal hernia   . Depression   . Gout   . Joint pain   . Depression   . Anxiety     Past Surgical History  Procedure Laterality Date  . Abdominal surgery      History reviewed. No pertinent family history. Social History:  reports that he has been smoking Cigarettes.  He has a 15 pack-year smoking history. He has quit using smokeless tobacco. He reports that he drinks about 7.2 ounces of alcohol per week. He reports that he does not use illicit drugs.  Allergies  No Known Allergies  Home Medications   (Not in a hospital admission)  Trauma Course   Results for orders placed during the hospital encounter of 06/30/13 (from the past 48 hour(s))  CBC WITH DIFFERENTIAL      Status: Abnormal   Collection Time    06/30/13  4:25 PM      Result Value Range   WBC 10.5  4.0 - 10.5 K/uL   RBC 2.96 (*) 4.22 - 5.81 MIL/uL   Hemoglobin 9.9 (*) 13.0 - 17.0 g/dL   HCT 59.5 (*) 63.8 - 75.6 %   MCV 97.0  78.0 - 100.0 fL   MCH 33.4  26.0 - 34.0 pg   MCHC 34.5  30.0 - 36.0 g/dL   RDW 43.3  29.5 - 18.8 %   Platelets 302  150 - 400 K/uL   Neutrophils Relative % 76  43 - 77 %   Neutro Abs 7.9 (*) 1.7 - 7.7 K/uL   Lymphocytes Relative 13  12 - 46 %   Lymphs Abs 1.3  0.7 - 4.0 K/uL   Monocytes Relative 12  3 - 12 %   Monocytes Absolute 1.2 (*) 0.1 - 1.0 K/uL   Eosinophils Relative 0  0 - 5 %   Eosinophils Absolute 0.0  0.0 - 0.7 K/uL   Basophils Relative 0  0 - 1 %   Basophils Absolute 0.0  0.0 - 0.1 K/uL  TROPONIN I     Status: None   Collection Time    06/30/13  4:25 PM      Result Value Range   Troponin I <0.30  <0.30  ng/mL   Comment:            Due to the release kinetics of cTnI,     a negative result within the first hours     of the onset of symptoms does not rule out     myocardial infarction with certainty.     If myocardial infarction is still suspected,     repeat the test at appropriate intervals.  PROTIME-INR     Status: None   Collection Time    06/30/13  4:25 PM      Result Value Range   Prothrombin Time 14.4  11.6 - 15.2 seconds   INR 1.14  0.00 - 1.49  COMPREHENSIVE METABOLIC PANEL     Status: Abnormal   Collection Time    06/30/13  4:25 PM      Result Value Range   Sodium 135  135 - 145 mEq/L   Potassium 4.0  3.5 - 5.1 mEq/L   Comment: HEMOLYSIS AT THIS LEVEL MAY AFFECT RESULT   Chloride 101  96 - 112 mEq/L   CO2 23  19 - 32 mEq/L   Glucose, Bld 232 (*) 70 - 99 mg/dL   BUN 27 (*) 6 - 23 mg/dL   Creatinine, Ser 1.61  0.50 - 1.35 mg/dL   Calcium 8.4  8.4 - 09.6 mg/dL   Total Protein 6.6  6.0 - 8.3 g/dL   Albumin 2.6 (*) 3.5 - 5.2 g/dL   AST 52 (*) 0 - 37 U/L   ALT 34  0 - 53 U/L   Alkaline Phosphatase 90  39 - 117 U/L   Total  Bilirubin 0.8  0.3 - 1.2 mg/dL   GFR calc non Af Amer 58 (*) >90 mL/min   GFR calc Af Amer 68 (*) >90 mL/min   Comment:            The eGFR has been calculated     using the CKD EPI equation.     This calculation has not been     validated in all clinical     situations.     eGFR's persistently     <90 mL/min signify     possible Chronic Kidney Disease.  OCCULT BLOOD, POC DEVICE     Status: Abnormal   Collection Time    06/30/13  6:10 PM      Result Value Range   Fecal Occult Bld POSITIVE (*) NEGATIVE  TYPE AND SCREEN     Status: None   Collection Time    06/30/13  6:25 PM      Result Value Range   ABO/RH(D) O POS     Antibody Screen NEG     Sample Expiration 07/03/2013     Dg Chest 2 View  06/30/2013   *RADIOLOGY REPORT*  Clinical Data: Shortness of breath, wheezing and hypoxia.  Recent fall with multiple left-sided rib fractures.  CHEST - 2 VIEW  Comparison: 06/27/2013 chest x-ray and CT of the chest on 06/22/2013.  Findings: No pneumothorax is identified.  There is further consolidation and atelectasis of the left lower lung with probable component of at least a small amount of left pleural fluid. Several left-sided rib fractures are again noted which do not demonstrate significant displacement by x-ray.  The heart is mildly enlarged.  No pulmonary edema is identified.  IMPRESSION: Progressive consolidation and atelectasis of the left lower lung with probable component of pleural fluid.  No pneumothorax is identified.   Original Report  Authenticated By: Irish Lack, M.D.   Ct Chest W Contrast  06/30/2013   *RADIOLOGY REPORT*  Clinical Data:  Recent fall with left-sided rib fractures. Worsening hypoxia, shortness of breath and pain.  CT CHEST, ABDOMEN AND PELVIS WITH CONTRAST  Technique:  Multidetector CT imaging of the chest, abdomen and pelvis was performed following the standard protocol during bolus administration of intravenous contrast.  Contrast: 80mL OMNIPAQUE IOHEXOL 300  MG/ML  SOLN  Comparison:  Chest x-ray earlier today and CT of the chest on 06/22/2013.  CT CHEST  Findings:  The the patient has developed a moderate to large left- sided hemothorax with high density irregular blood present in the inferior aspect of the pleural fluid collection.  This is adjacent to the multiple mildly displaced fractures on the left involving the sixth, seventh, eighth, ninth and tenth ribs.  All of the fracture shows slightly increased displacement compared to the prior CT.  There is no evidence of pneumothorax.  Compressive atelectasis of the lung is identified adjacent to the hemothorax.  No pulmonary edema is identified.  Incidental coronary atherosclerosis with calcified plaque present in a three-vessel distribution.  IMPRESSION: Development of large left hemothorax with high density blood and pleural fluid identified adjacent to multiple rib fractures.  The sixth through tenth ribs show fractures which all show slightly increased displacement since the prior CT.  CT ABDOMEN AND PELVIS  Findings:  There is no evidence of injury involving the solid organs of the abdomen.  A few cysts are present in the liver which have a benign appearance.  There are two densely calcified gallstones in the gallbladder lumen.  There are a multitude of ventral hernia defects anteriorly located superior to the umbilicus.  At least six ventral hernia defects are identified.  One contains a short segment of the transverse colon and another contains a different portion of the transverse colon. There is no evidence of incarceration.  The other small hernia defects contain fat.  There also is a right inguinal hernia containing fat.  No masses or enlarged lymph nodes are seen.  Aorta and iliac arteries are heavily calcified without evidence of aneurysm.  No abnormal fluid collections are seen.  No masses or enlarged lymph nodes.  IMPRESSION: No acute injuries are identified in the abdomen or pelvis.  A number of  anterior ventral hernia defects are seen without evidence of strangulation.  A right inguinal hernia is also present.   Original Report Authenticated By: Irish Lack, M.D.   Ct Abdomen Pelvis W Contrast  06/30/2013   *RADIOLOGY REPORT*  Clinical Data:  Recent fall with left-sided rib fractures. Worsening hypoxia, shortness of breath and pain.  CT CHEST, ABDOMEN AND PELVIS WITH CONTRAST  Technique:  Multidetector CT imaging of the chest, abdomen and pelvis was performed following the standard protocol during bolus administration of intravenous contrast.  Contrast: 80mL OMNIPAQUE IOHEXOL 300 MG/ML  SOLN  Comparison:  Chest x-ray earlier today and CT of the chest on 06/22/2013.  CT CHEST  Findings:  The the patient has developed a moderate to large left- sided hemothorax with high density irregular blood present in the inferior aspect of the pleural fluid collection.  This is adjacent to the multiple mildly displaced fractures on the left involving the sixth, seventh, eighth, ninth and tenth ribs.  All of the fracture shows slightly increased displacement compared to the prior CT.  There is no evidence of pneumothorax.  Compressive atelectasis of the lung is identified adjacent to the hemothorax.  No pulmonary edema is identified.  Incidental coronary atherosclerosis with calcified plaque present in a three-vessel distribution.  IMPRESSION: Development of large left hemothorax with high density blood and pleural fluid identified adjacent to multiple rib fractures.  The sixth through tenth ribs show fractures which all show slightly increased displacement since the prior CT.  CT ABDOMEN AND PELVIS  Findings:  There is no evidence of injury involving the solid organs of the abdomen.  A few cysts are present in the liver which have a benign appearance.  There are two densely calcified gallstones in the gallbladder lumen.  There are a multitude of ventral hernia defects anteriorly located superior to the umbilicus.   At least six ventral hernia defects are identified.  One contains a short segment of the transverse colon and another contains a different portion of the transverse colon. There is no evidence of incarceration.  The other small hernia defects contain fat.  There also is a right inguinal hernia containing fat.  No masses or enlarged lymph nodes are seen.  Aorta and iliac arteries are heavily calcified without evidence of aneurysm.  No abnormal fluid collections are seen.  No masses or enlarged lymph nodes.  IMPRESSION: No acute injuries are identified in the abdomen or pelvis.  A number of anterior ventral hernia defects are seen without evidence of strangulation.  A right inguinal hernia is also present.   Original Report Authenticated By: Irish Lack, M.D.   Dg Chest Portable 1 View  06/30/2013   *RADIOLOGY REPORT*  Clinical Data: Left rib fractures and chest tube placement for hemothorax.  PORTABLE CHEST - 1 VIEW  Comparison: Chest x-ray and CT of the chest earlier today.  Findings: Left chest tube is in place.  Hemothorax has been evacuated and there is significantly improved aeration of the left lung.  Multiple displaced left-sided rib fractures are present.  No pneumothorax is identified.  IMPRESSION: Improved aeration after evacuation of hemothorax.  No pneumothorax is identified.   Original Report Authenticated By: Irish Lack, M.D.    Review of Systems  Unable to perform ROS: acuity of condition  Constitutional: Positive for diaphoresis. Negative for fever.  Respiratory: Positive for shortness of breath and wheezing. Negative for sputum production.   Cardiovascular: Positive for chest pain. Negative for syncope.  Gastrointestinal: Negative for vomiting and abdominal pain.  Neurological: Negative for headaches.    Blood pressure 141/77, pulse 95, temperature 98.4 F (36.9 C), temperature source Oral, resp. rate 19, SpO2 95.00%. Physical Exam  Vitals reviewed. Constitutional: He  appears well-developed. He appears lethargic. He appears cachectic. He is cooperative. He has a sickly appearance. No distress. Nasal cannula in place.  HENT:  Head: Normocephalic and atraumatic.  Right Ear: External ear normal.  Left Ear: External ear normal.  Eyes: Conjunctivae and EOM are normal. Pupils are equal, round, and reactive to light.  Some injection  Neck: Normal range of motion. Neck supple. No tracheal deviation present.  Cardiovascular: Regular rhythm and intact distal pulses.  Tachycardia present.   Respiratory: Accessory muscle usage present. No stridor. Tachypnea noted. He has decreased breath sounds in the left upper field and the left middle field. He has wheezes in the left middle field. He exhibits bony tenderness. He exhibits no crepitus.  GI: Soft. He exhibits no distension. There is no tenderness. A hernia is present. Hernia confirmed positive in the ventral area.    Musculoskeletal: Normal range of motion. He exhibits no edema and no tenderness.  Lymphadenopathy:  He has no cervical adenopathy.  Neurological: He appears lethargic. No cranial nerve deficit or sensory deficit. GCS eye subscore is 4. GCS verbal subscore is 5. GCS motor subscore is 6.  Slow response, has to think about some answers for a sec  Skin: Skin is warm. No rash noted. He is diaphoretic. No erythema.  Psychiatric: He has a normal mood and affect. His behavior is normal.     Assessment/Plan Left hemothorax Recent fall with multiple left rib fractures ABL anemia Alcohol dependence COPD PCMN Incisional hernias  Place Left chest tube Admit SDU Aggressive pulm toilet Banana bag CIWA protocol.  Home BP meds  Mary Sella. Andrey Campanile, MD, FACS General, Bariatric, & Minimally Invasive Surgery [facility] Surgery, [place]   [facility] M 06/30/2013, 10:33 PM   Procedures

## 2013-06-30 NOTE — ED Notes (Signed)
Pt able to speak in complete sentences, but with noted difficulty. Pt with retractions, dyspnea, rhonchi throughout. Pt reporting 10/10 left sided rib pain due to previous fractures. PT AO x 4. 97% 3L [place].

## 2013-06-30 NOTE — ED Notes (Signed)
Roommate/friend requesting updates. Number [phone]. Friend requesting placement for pt at [facility]. States she called facility and was told they had an available bed for pt.

## 2013-07-01 ENCOUNTER — Encounter (HOSPITAL_COMMUNITY): Payer: Self-pay

## 2013-07-01 ENCOUNTER — Inpatient Hospital Stay (HOSPITAL_COMMUNITY): Payer: Medicare Other

## 2013-07-01 DIAGNOSIS — D62 Acute posthemorrhagic anemia: Secondary | ICD-10-CM | POA: Diagnosis present

## 2013-07-01 DIAGNOSIS — S271XXA Traumatic hemothorax, initial encounter: Secondary | ICD-10-CM

## 2013-07-01 LAB — CBC
HCT: 29.1 % — ABNORMAL LOW (ref 39.0–52.0)
HCT: 29.6 % — ABNORMAL LOW (ref 39.0–52.0)
Hemoglobin: 9.6 g/dL — ABNORMAL LOW (ref 13.0–17.0)
Hemoglobin: 9.7 g/dL — ABNORMAL LOW (ref 13.0–17.0)
MCH: 32.5 pg (ref 26.0–34.0)
MCHC: 33 g/dL (ref 30.0–36.0)
RDW: 13.6 % (ref 11.5–15.5)
WBC: 9.9 10*3/uL (ref 4.0–10.5)

## 2013-07-01 LAB — COMPREHENSIVE METABOLIC PANEL
Alkaline Phosphatase: 85 U/L (ref 39–117)
BUN: 17 mg/dL (ref 6–23)
Calcium: 8.3 mg/dL — ABNORMAL LOW (ref 8.4–10.5)
GFR calc Af Amer: >90 mL/min (ref >90)
GFR calc non Af Amer: 82 mL/min — ABNORMAL LOW (ref >90)
Glucose, Bld: 90 mg/dL (ref 70–99)
Total Protein: 5.9 g/dL — ABNORMAL LOW (ref 6.0–8.3)

## 2013-07-01 MED ORDER — ALUM & MAG HYDROXIDE-SIMETH 200-200-20 MG/5ML PO SUSP
30.0000 mL | Freq: Four times a day (QID) | ORAL | Status: DC | PRN
  Administered 2013-07-01 – 2013-07-11 (×3): 30 mL via ORAL
  Filled 2013-07-01 (×3): qty 30

## 2013-07-01 MED ORDER — TRAMADOL HCL 50 MG PO TABS
50.0000 mg | ORAL_TABLET | Freq: Four times a day (QID) | ORAL | Status: DC | PRN
  Administered 2013-07-01 – 2013-07-06 (×5): 100 mg via ORAL
  Administered 2013-07-07 – 2013-07-12 (×4): 50 mg via ORAL
  Administered 2013-07-14: 100 mg via ORAL
  Filled 2013-07-01: qty 2
  Filled 2013-07-01: qty 1
  Filled 2013-07-01: qty 2
  Filled 2013-07-01: qty 1
  Filled 2013-07-01 (×6): qty 2

## 2013-07-01 MED ORDER — LORAZEPAM 2 MG/ML IJ SOLN
1.0000 mg | Freq: Once | INTRAMUSCULAR | Status: AC
  Administered 2013-07-01: 1 mg via INTRAVENOUS
  Filled 2013-07-01: qty 1

## 2013-07-01 MED ORDER — MORPHINE SULFATE 2 MG/ML IJ SOLN
2.0000 mg | INTRAMUSCULAR | Status: DC | PRN
  Administered 2013-07-04 – 2013-07-09 (×4): 2 mg via INTRAVENOUS
  Filled 2013-07-01 (×4): qty 1
  Filled 2013-07-01: qty 2

## 2013-07-01 MED ORDER — SPIRITUS FRUMENTI
1.0000 | Freq: Four times a day (QID) | ORAL | Status: DC
  Administered 2013-07-01 – 2013-07-02 (×4): 1 via ORAL
  Filled 2013-07-01 (×9): qty 1

## 2013-07-01 MED ORDER — SPIRITUS FRUMENTI
1.0000 | Freq: Once | ORAL | Status: AC
  Administered 2013-07-01: 1 via ORAL
  Filled 2013-07-01: qty 1

## 2013-07-01 MED ORDER — SODIUM CHLORIDE 0.45 % IV SOLN
INTRAVENOUS | Status: DC
  Administered 2013-07-01 – 2013-07-06 (×3): via INTRAVENOUS
  Filled 2013-07-01 (×8): qty 1000

## 2013-07-01 NOTE — Progress Notes (Signed)
Patient ID: Zachary [name], male   DOB: Jun 12, 1941, 72 y.o.   MRN: [id number]   LOS: 1 day   Subjective: Mild confusion.   Objective: Vital signs in last 24 hours: Temp:  [98.2 F (36.8 C)-99 F (37.2 C)] 99 F (37.2 C) (07/29 0752) Pulse Rate:  [86-130] 86 (07/29 0800) Resp:  [17-39] 25 (07/29 0800) BP: (109-180)/(65-109) 109/65 mmHg (07/29 0800) SpO2:  [88 %-100 %] 100 % (07/29 0800) Weight:  [142 lb 3.2 oz (64.5 kg)] 142 lb 3.2 oz (64.5 kg) (07/28 2332) Last BM Date: 06/23/13 (per pt. report )   IS:   CT No air leak 1273ml/insertion @1220ml    Laboratory  CBC  Recent Labs  07/01/13 0020 07/01/13 0355  WBC 9.9 9.6  HGB 9.7* 9.6*  HCT 29.6* 29.1*  PLT 246 249   BMET  Recent Labs  06/30/13 1625 07/01/13 0355  NA 135 138  K 4.0 4.0  CL 101 105  CO2 23 29  GLUCOSE 232* 90  BUN 27* 17  CREATININE 1.22 0.96  CALCIUM 8.4 8.3*    Radiology Results PORTABLE CHEST - 1 VIEW  Comparison: Prior chest x-ray 06/30/2013  Findings: Stable position of left thoracostomy tube. No  pneumothorax identified. Displaced left-sided rib fractures again  noted. Slightly increased opacity in the left base. Stable  cardiac and mediastinal contours. The right lung remains clear.  IMPRESSION:  1. Stable position of left thoracostomy tube without evidence of  pneumothorax.  2. Slightly increased left basilar opacity likely reflects  atelectasis. Reaccumulation of a small amount of basilar pleural  fluid is not excluded.  Original Report Authenticated By: Malachy Moan, M.D.   Physical Exam General appearance: no distress Resp: clear to auscultation bilaterally Cardio: regular rate and rhythm GI: normal findings: bowel sounds normal and soft, non-tender   Assessment/Plan: Fall Mult left rib fxs w/delayed HTX s/p CT -- Continue CT to suction ABL anemia -- Stable EtOH -- CIWA, give beer FEN -- Advance diet, give fluids VTE -- SCD's, Lovenox Dispo --  Continue SDU with agitation   Freeman Caldron, PA-C Pager: [phone] General Trauma PA Pager: [phone]   07/01/2013
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
## 2013-07-01 NOTE — Progress Notes (Signed)
Patient dong very poorly with IS.  CT drainage has tapered off.  CPM with ETOH withdrawal protocol.  This patient has been seen and I agree with the findings and treatment plan.  Marta Lamas. Gae Bon, MD, FACS [phone] (pager) [phone] (direct pager) Trauma Surgeon

## 2013-07-01 NOTE — Progress Notes (Signed)
HR maintaining in the high 120s-130s. BP 117/82. Disoriented to place, time and situation. Seeing "horses on the street." Presence of tremors. 1mg  of IV ativan given at 0210. No change noted. Dr. Andrey Campanile notified. New orders received. Will continue to monitor.   Rochele Pages, RN

## 2013-07-02 ENCOUNTER — Inpatient Hospital Stay (HOSPITAL_COMMUNITY): Payer: Medicare Other

## 2013-07-02 LAB — CBC
Hemoglobin: 9.8 g/dL — ABNORMAL LOW (ref 13.0–17.0)
MCH: 32.7 pg (ref 26.0–34.0)
RBC: 3 MIL/uL — ABNORMAL LOW (ref 4.22–5.81)
WBC: 11.2 10*3/uL — ABNORMAL HIGH (ref 4.0–10.5)

## 2013-07-02 MED ORDER — LORAZEPAM 2 MG/ML IJ SOLN
1.0000 mg | Freq: Four times a day (QID) | INTRAMUSCULAR | Status: DC
  Administered 2013-07-02 – 2013-07-06 (×15): 1 mg via INTRAVENOUS
  Filled 2013-07-02 (×11): qty 1

## 2013-07-02 MED ORDER — LACTULOSE 10 GM/15ML PO SOLN
20.0000 g | Freq: Every day | ORAL | Status: DC | PRN
  Administered 2013-07-02: 20 g via ORAL
  Filled 2013-07-02: qty 30

## 2013-07-02 MED ORDER — SPIRITUS FRUMENTI
2.0000 | Freq: Three times a day (TID) | ORAL | Status: DC
  Administered 2013-07-02 – 2013-07-06 (×13): 2 via ORAL
  Filled 2013-07-02 (×22): qty 2

## 2013-07-02 NOTE — Progress Notes (Signed)
UR completed 

## 2013-07-02 NOTE — Progress Notes (Signed)
Patient ID: Zachary [name], male   DOB: 05/01/1941, 72 y.o.   MRN: [id number]   LOS: 2 days   Subjective: Pleasantly confused.   Objective: Vital signs in last 24 hours: Temp:  [97.6 F (36.4 C)-98.8 F (37.1 C)] 98.6 F (37 C) (07/30 0329) Pulse Rate:  [57-86] 65 (07/30 0329) Resp:  [17-23] 18 (07/30 0329) BP: (103-147)/(61-72) 121/61 mmHg (07/30 0329) SpO2:  [96 %-100 %] 98 % (07/30 0329) Last BM Date: 06/23/13 (per patient)   IS: (=)   CT No air leak 57ml/24h @1260ml    Laboratory  CBC  Recent Labs  07/01/13 0355 07/02/13 0427  WBC 9.6 11.2*  HGB 9.6* 9.8*  HCT 29.1* 29.8*  PLT 249 275    Radiology Results PORTABLE CHEST - 1 VIEW  Comparison: 07/01/2013  Findings: Stable left chest tube. No significant or enlarging  pneumothorax demonstrated. Stable left base residual airspace  disease/atelectasis. Minimal right base atelectasis. Normal heart  size and vascularity. Trachea is midline. No significant interval  change. Acute left rib fractures evident.  IMPRESSION:  No significant or enlarging left pneumothorax. Stable left chest  tube position.  Original Report Authenticated By: Judie Petit. Miles Costain, M.D.   Physical Exam General appearance: no distress Resp: wheezes RUL Cardio: regular rate and rhythm GI: normal findings: bowel sounds normal and soft, non-tender Neuro: Ox1   Assessment/Plan: Fall  Mult left rib fxs w/delayed HTX s/p CT -- CT to water seal. Per RN pt pulled on tube though placement appears stable. Will add mesentery taping. ABL anemia -- Stable  EtOH -- CIWA, give beer  FEN -- Continue IVF VTE -- SCD's, Lovenox  Dispo -- Continue SDU with agitation    Freeman Caldron, PA-C Pager: [phone] General Trauma PA Pager: [phone]   07/02/2013

## 2013-07-02 NOTE — Progress Notes (Signed)
Very sluggish.  This patient has been seen and I agree with the findings and treatment plan.  Marta Lamas. Gae Bon, MD, FACS [phone] (pager) [phone] (direct pager) Trauma Surgeon

## 2013-07-03 ENCOUNTER — Inpatient Hospital Stay (HOSPITAL_COMMUNITY): Payer: Medicare Other

## 2013-07-03 DIAGNOSIS — R404 Transient alteration of awareness: Secondary | ICD-10-CM

## 2013-07-03 NOTE — Progress Notes (Signed)
Called Psych to eval patients medical decision making competency/capacity.  They will see the patient tomorrow AM.  Do not believe he has the capacity to understand his medical status and the consequences of his decisions if he refuses SNF.  This is especially important given he refused SNF last admission and bounced back to the hospital.

## 2013-07-03 NOTE — Progress Notes (Signed)
Trauma Service Note  Subjective: Patient is all over the place.  Disoriented and agitated.  Has posy on.  Still trying to climb out of bed.  Drinking beer, but not eating very much at all.  Objective: Vital signs in last 24 hours: Temp:  [97.9 F (36.6 C)-99.4 F (37.4 C)] 98.3 F (36.8 C) (07/31 0300) Pulse Rate:  [46-74] 74 (07/31 0759) Resp:  [23-25] 25 (07/31 0300) BP: (113-153)/(63-80) 153/74 mmHg (07/31 0759) SpO2:  [93 %-96 %] 93 % (07/31 0300) Last BM Date: 06/23/13  Intake/Output from previous day: 07/30 0701 - 07/31 0700 In: 2294.2 [P.O.:1000; I.V.:1294.2] Out: 2970 [Urine:2925; Chest Tube:45] Intake/Output this shift:    General: No acute distress, but agitated.  Lungs: Decreased breath sounds on his left.  CXR today show left basilar atelectasis with possible left effusion.  Only 110 cc output from chest tube yesterday.  45 cc today.  Abd: Benign  Extremities: No changes   Neuro: Oriented to person only, not to time or place.  Lab Results: CBC   Recent Labs  07/01/13 0355 07/02/13 0427  WBC 9.6 11.2*  HGB 9.6* 9.8*  HCT 29.1* 29.8*  PLT 249 275   BMET  Recent Labs  06/30/13 1625 07/01/13 0355  NA 135 138  K 4.0 4.0  CL 101 105  CO2 23 29  GLUCOSE 232* 90  BUN 27* 17  CREATININE 1.22 0.96  CALCIUM 8.4 8.3*   PT/INR  Recent Labs  06/30/13 1625  LABPROT 14.4  INR 1.14   ABG No results found for this basename: PHART, PCO2, PO2, HCO3,  in the last 72 hours  Studies/Results: Dg Chest Port 1 View  07/03/2013   *RADIOLOGY REPORT*  Clinical Data: Pneumothorax evaluation.  PORTABLE CHEST - 1 VIEW  Comparison: Chest x-ray from yesterday.  Findings: Worsening aeration at the left base, with the diaphragm likely more elevated and the veil like opacity more dense.  A discrete hazy opacity in the left upper chest is similar to prior. Similar positioning of large bore left thoracostomy tube, with side port again just inside the rib cage.  No  definite pneumothorax. Right lung well-aerated.  Normal heart size.  No new osseous abnormality. Left-sided rib fractures better seen previously.  IMPRESSION: 1. No significant or enlarging pneumothorax.  Unchanged positioning of left chest tube. 2. Left-sided pleural fluid and atelectasis marginally increased from yesterday.   Original Report Authenticated By: Tiburcio Pea   Dg Chest Port 1 View  07/02/2013   *RADIOLOGY REPORT*  Clinical Data: Pneumothorax, left chest tube  PORTABLE CHEST - 1 VIEW  Comparison: 07/01/2013  Findings: Stable left chest tube.  No significant or enlarging pneumothorax demonstrated.  Stable left base residual airspace disease/atelectasis.  Minimal right base atelectasis.  Normal heart size and vascularity.  Trachea is midline.  No significant interval change. Acute left rib fractures evident.  IMPRESSION: No significant or enlarging left pneumothorax.  Stable left chest tube position.   Original Report Authenticated By: Judie Petit. Miles Costain, M.D.    Anti-infectives: Anti-infectives   None      Assessment/Plan: s/p  Not sure how we are going to improve the status of this patient. Can get left chest tube out. Continue to work with therapies. Will need SNF placement.  LOS: 3 days   Marta Lamas. Gae Bon, MD, FACS [phone] Trauma Surgeon 07/03/2013

## 2013-07-03 NOTE — Progress Notes (Signed)
Physical Therapy Treatment Patient Details Name: Zachary [name] MRN: [id number] DOB: [birth year] Today's Date: 07/03/2013 Time: 1135-1200 PT Time Calculation (min): 25 min  PT Assessment / Plan / Recommendation  History of Present Illness 71 year old who recently d/c'd home after admission for fall with L 6-9 rib fx's.  Returns this admission due to SOB with CT showing hemothorax, s/p chest tube.   PT Comments   Pt admitted with fall with rib fxs and HTX. Pt currently with functional limitations due to confusion and poor safety. Pt will benefit from skilled PT to increase their independence and safety with mobility to allow discharge to the venue listed below.   Follow Up Recommendations  SNF;Other (comment)                 Equipment Recommendations  Rolling walker with 5" wheels        Frequency Min 3X/week   Progress towards PT Goals Progress towards PT goals: Progressing toward goals  Plan Current plan remains appropriate    Precautions / Restrictions Precautions Precautions: Fall Restrictions Weight Bearing Restrictions: No   Pertinent Vitals/Pain Desat without O2, needed 2LO2 to keep sats >90%.  No pain    Mobility  Bed Mobility Bed Mobility: Supine to Sit;Sitting - Scoot to [place] of Bed;Sit to Supine Supine to Sit: 4: Min assist Sitting - Scoot to [place] of Bed: 4: Min assist Sit to Supine: 4: Min assist Details for Bed Mobility Assistance: assist to initiate and truncal assist.  Pt was soaked with urine.  Changed gown and linens and cleaned pt.   Transfers Transfers: Sit to Stand;Stand to Sit Sit to Stand: 4: Min assist;With upper extremity assist;From chair/3-in-1;From bed Stand to Sit: 4: Min guard;With upper extremity assist;To chair/3-in-1;To bed Details for Transfer Assistance: vc/tc's for safe technique/hand placement.  steady assist Ambulation/Gait Ambulation/Gait Assistance: 3: Mod assist Ambulation Distance (Feet): 25 Feet Assistive device: Rolling  walker Ambulation/Gait Assistance Details: Pt had very wide BOS.  Mod assist and mod cues for sequencing technique.  Staggering around.  Poor safety with RW needing mod assist to control RW.   Gait Pattern: Step-through pattern;Decreased step length - right;Decreased step length - left;Decreased stride length;Wide base of support;Trunk flexed Gait velocity: slow Stairs: No Wheelchair Mobility Wheelchair Mobility: No    Exercises General Exercises - Lower Extremity Ankle Circles/Pumps: AROM;Both;10 reps;Supine Long Arc Quad: AROM;10 reps;Both;Seated Heel Slides: AROM;Both;10 reps;Supine    PT Goals (current goals can now be found in the care plan section)    Visit Information  Last PT Received On: 07/03/13 Assistance Needed: +1 (if you are going out of room, +2 needed) History of Present Illness: 72 year old who recently d/c'd home after admission for fall with L 6-9 rib fx's.  Returns this admission due to SOB with CT showing hemothorax, s/p chest tube.    Subjective Data  Subjective: Pt grunted a lot.     Cognition  Cognition Arousal/Alertness: Awake/alert Behavior During Therapy: Flat affect Overall Cognitive Status: Impaired/Different from baseline Current Attention Level: Selective Following Commands: Follows one step commands consistently Safety/Judgement: Decreased awareness of safety;Decreased awareness of deficits Problem Solving: Slow processing;Difficulty sequencing;Requires verbal cues;Requires tactile cues    Balance  Static Sitting Balance Static Sitting - Balance Support: No upper extremity supported;Feet supported Static Sitting - Level of Assistance: 5: Stand by assistance Static Standing Balance Static Standing - Balance Support: Bilateral upper extremity supported;During functional activity Static Standing - Level of Assistance: 4: Min assist Static Standing - Comment/#  of Minutes: 1  End of Session PT - End of Session Equipment Utilized During Treatment:  Gait belt;Oxygen Activity Tolerance: Patient limited by fatigue Patient left: in chair;with call bell/phone within reach;with chair alarm set;with restraints reapplied Nurse Communication: Mobility status       INGOLD,Ziare Orrick 07/03/2013, 2:02 PM [facility] [phone] (pager)

## 2013-07-03 NOTE — Progress Notes (Signed)
Patient discussed in Trauma rounds today- s/p fall with mulitple rib fx's- he is currently disoriented and unable to follow commands- MD states patient will likely need SNF at d/c- Psych eval ordered for capacity- CSW attempting to reach next of kin for assessment and further discussion of possible SNF- CSW will follow-  Full assessment and SBIRT to follow Reece Levy, MSW  [phone]

## 2013-07-04 ENCOUNTER — Inpatient Hospital Stay (HOSPITAL_COMMUNITY): Payer: Medicare Other

## 2013-07-04 DIAGNOSIS — F1020 Alcohol dependence, uncomplicated: Secondary | ICD-10-CM

## 2013-07-04 NOTE — Consult Note (Signed)
Patient Identification:  Zachary [name] Date of Evaluation:  07/04/2013   History of Present Illness:HPI: 72 yo WM recently on trauma service from 7/20-7/26 after sustaining a ground level fall resulting in multiple left rib fractures. He was admitted. He developed signs of alcohol withdrawal despite being on CIWA protocol and was given beers as well which controlled his symptoms. He was brought back to ED b/c of worsening SOB and difficulty breathing. CXR and CT demonstrated large left hemothorax and ABL anemia. According to roommate pt has not had any alcohol since discharge from hospital   Patient says that he's been drinking for many years now, but did not drink after his last discharge from the hospital. He adds that he has difficulty with his balance and sometimes ends up falling. On being questioned why he was hospitalized, patient stated that he has a tube in his chest but does not know why. Patient states that he knows he is at [facility]. Patient unable to elaborate on his medical diagnoses, treatment offered.   Past Psychiatric History the patient's last psychiatric consultation to behavior health hospital was in June of 2014 for alcohol detox. During that hospitalization he was evaluated by physical therapy as patient was noted to have unsteady gait. Patient does have other previous psychiatric hospitalizations for alcohol detox   Past Medical History:     Past Medical History  Diagnosis Date  . Hypertension   . Coronary artery disease   . Hyperlipemia   . Angina   . Shortness of breath   . Mental disorder   . Arthritis   . COPD (chronic obstructive pulmonary disease)   . Hiatal hernia   . Depression   . Gout   . Joint pain   . Depression   . Anxiety        Past Surgical History  Procedure Laterality Date  . Abdominal surgery      Allergies: No Known Allergies  Current Medications:  Meds ordered this encounter  Medications  . albuterol (PROVENTIL  HFA;VENTOLIN HFA) 108 (90 BASE) MCG/ACT inhaler    Sig: Inhale 2 puffs into the lungs every 6 (six) hours as needed for wheezing.  . gabapentin (NEURONTIN) 100 MG capsule    Sig: Take 100 mg by mouth 3 (three) times daily.  Marland Kitchen LORazepam (ATIVAN) 1 MG tablet    Sig: Take 1 mg by mouth every 6 (six) hours as needed for anxiety.  . metoprolol tartrate (LOPRESSOR) 25 MG tablet    Sig: Take 25 mg by mouth 2 (two) times daily.  . traZODone (DESYREL) 50 MG tablet    Sig: Take 50-100 mg by mouth at bedtime as needed for sleep. *takes an extra 50mg  if need help sleeping  . naproxen (NAPROSYN) 500 MG tablet    Sig: Take 500 mg by mouth 2 (two) times daily with a meal.  . traMADol (ULTRAM) 50 MG tablet    Sig: Take 50-100 mg by mouth every 6 (six) hours as needed for pain.  . iohexol (OMNIPAQUE) 300 MG/ML solution 80 mL    Sig:   . albuterol (PROVENTIL) (5 MG/ML) 0.5% nebulizer solution 5 mg    Sig:   . fentaNYL (SUBLIMAZE) injection 50 mcg    Sig:   . sodium chloride 0.9 % bolus 2,000 mL    Sig:   . midazolam (VERSED) injection 4 mg    Sig:     On call for procedure  . fentaNYL (SUBLIMAZE) injection 300 mcg  Sig:     On call for procedure  . ondansetron (ZOFRAN) injection 4 mg    Sig:   . fentaNYL (SUBLIMAZE) 0.05 MG/ML injection    Sig:     BERARD, BRITTANY: cabinet override  . midazolam (VERSED) 2 MG/2ML injection    Sig:     BERARD, BRITTANY: cabinet override  . ondansetron (ZOFRAN) 4 MG/2ML injection    Sig:     BERARD, BRITTANY: cabinet override  . fentaNYL (SUBLIMAZE) injection    Sig:   . albuterol (PROVENTIL HFA;VENTOLIN HFA) 108 (90 BASE) MCG/ACT inhaler 2 puff    Sig:   . gabapentin (NEURONTIN) capsule 100 mg    Sig:   . metoprolol tartrate (LOPRESSOR) tablet 25 mg    Sig:   . LORazepam (ATIVAN) tablet 1 mg    Sig:    Or  . LORazepam (ATIVAN) injection 1 mg    Sig:   . thiamine (VITAMIN B-1) tablet 100 mg    Sig:   . DISCONTD: thiamine (B-1) injection 100 mg     Sig:   . folic acid (FOLVITE) tablet 1 mg    Sig:   . multivitamin with minerals tablet 1 tablet    Sig:   . enoxaparin (LOVENOX) injection 40 mg    Sig:   . ondansetron (ZOFRAN) tablet 4 mg    Sig:    Or  . ondansetron (ZOFRAN) injection 4 mg    Sig:   . DISCONTD: pantoprazole (PROTONIX) EC tablet 40 mg    Sig:   . DISCONTD: pantoprazole (PROTONIX) injection 40 mg    Sig:   . DISCONTD: morphine 2 MG/ML injection 1-4 mg    Sig:   . bisacodyl (DULCOLAX) suppository 10 mg    Sig:   . docusate sodium (COLACE) capsule 100 mg    Sig:   . sodium chloride 0.9 % 1,000 mL with thiamine 100 mg, folic acid 1 mg, multivitamins adult 10 mL infusion    Sig:   . alum & mag hydroxide-simeth (MAALOX/MYLANTA) 200-200-20 MG/5ML suspension 30 mL    Sig:   . LORazepam (ATIVAN) injection 1 mg    Sig:   . spiritus frumenti (ethyl alcohol) solution 1 each    Sig:   . traMADol (ULTRAM) tablet 50-100 mg    Sig:   . morphine 2 MG/ML injection 2 mg    Sig:   . DISCONTD: spiritus frumenti (ethyl alcohol) solution 1 each    Sig:   . sodium chloride 0.45 % 1,000 mL infusion    Sig:   . lactulose (CHRONULAC) 10 GM/15ML solution 20 g    Sig:   . LORazepam (ATIVAN) injection 1 mg    Sig:   . spiritus frumenti (ethyl alcohol) solution 2 each    Sig:      Social History:    reports that he has been smoking Cigarettes.  He has a 15 pack-year smoking history. He has quit using smokeless tobacco. He reports that he drinks about 7.2 ounces of alcohol per week. He reports that he does not use illicit drugs. Patient does have a history of drinking 2- 40 oz. Beers per day history of tremors when he stops drinking  Family History:    History reviewed. No pertinent family history.  Mental Status Examination/Evaluation: Patient is oriented to self, knows that he is at [facility]. Patient states that this is 2002. Patient reports his mood as okay, his affect is flat Patient unable to tell what  brought him to the hospital, the treatment that he's receiving. He does acknowledge that he is a problem with alcohol and states that he's been drinking for a long time. Patient thought content has ideation no homicidal ideation he does not appear to be delusional or paranoid. 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  AXIS IV problems related to social environment and problems with access to health care services  AXIS V 31-40 impairment in reality testing     Assessment/Plan: Patient does not seem to have the mental capacity to make an informed decision Patient does not need inpatient psychiatric care but does need a skilled nursing facility as he is unable to understand his diagnosis , treatment options and will not be able to take care of himself Patient has a very long history of alcohol abuse/dependency with multiple psychiatric hospitalizations for alcohol detox and poor followup after that. Korsakoff psychosis does need to be kept in mind due to patient's current presentation

## 2013-07-04 NOTE — Progress Notes (Signed)
Trauma Service Note  Subjective: Patient still getting very agitated and pulling out lines.  Getting two beers four times daily.  Will not eat much food.  Able to awaken the patient who was oriented to place and person.  Says his left shoulder hurts.  Objective: Vital signs in last 24 hours: Temp:  [97.6 F (36.4 C)-98.9 F (37.2 C)] 98.9 F (37.2 C) (08/01 0800) Pulse Rate:  [65-84] 76 (08/01 0800) Resp:  [19-28] 22 (08/01 0800) BP: (116-151)/(67-91) 131/76 mmHg (08/01 0800) SpO2:  [87 %-100 %] 100 % (08/01 0800) Last BM Date: 06/23/13  Intake/Output from previous day: 07/31 0701 - 08/01 0700 In: 900 [I.V.:900] Out: 1700 [Urine:1700] Intake/Output this shift: Total I/O In: -  Out: 300 [Urine:300]  General: No acute distress  Lungs: Markedly decreased breath sounds in the LLL.  Will Repeat CXR today.  Abd: soft, benign.  Not eating much at all.  Extremities: Mittens on  His hands because he pulls out lines.  Neuro: Somewhat intact.  Will follow commands.  Just very lethargic and intermittently agitated.  Lab Results: CBC   Recent Labs  07/02/13 0427  WBC 11.2*  HGB 9.8*  HCT 29.8*  PLT 275   BMET No results found for this basename: NA, K, CL, CO2, GLUCOSE, BUN, CREATININE, CALCIUM,  in the last 72 hours PT/INR No results found for this basename: LABPROT, INR,  in the last 72 hours ABG No results found for this basename: PHART, PCO2, PO2, HCO3,  in the last 72 hours  Studies/Results: Dg Chest Port 1 View  07/03/2013   *RADIOLOGY REPORT*  Clinical Data: Status post chest tube removal.  PORTABLE CHEST - 1 VIEW  Comparison: Chest x-ray 07/03/2013.  Findings: Previously noted chest tube in the upper left hemithorax has been removed.  No appreciable pneumothorax at this time. Multiple displaced left-sided rib fractures are again noted. Opacities throughout the left mid to lower hemithorax are compatible with residual hemothorax and resolving areas of subsegmental  atelectasis and/or air space consolidation in the left base.  Right lung appears clear.  No evidence of pulmonary edema. Heart size is within normal limits.  Upper mediastinal contours are unremarkable.  IMPRESSION: 1.  Status post removal of left-sided chest tube.  No pneumothorax at this time. 2.  Multiple displaced left-sided rib fractures redemonstrated with areas of atelectasis and/or consolidation throughout the left mid to lower lung with superimposed moderate left hemothorax.   Original Report Authenticated By: Trudie Reed, M.D.   Dg Chest Port 1 View  07/03/2013   *RADIOLOGY REPORT*  Clinical Data: Pneumothorax evaluation.  PORTABLE CHEST - 1 VIEW  Comparison: Chest x-ray from yesterday.  Findings: Worsening aeration at the left base, with the diaphragm likely more elevated and the veil like opacity more dense.  A discrete hazy opacity in the left upper chest is similar to prior. Similar positioning of large bore left thoracostomy tube, with side port again just inside the rib cage.  No definite pneumothorax. Right lung well-aerated.  Normal heart size.  No new osseous abnormality. Left-sided rib fractures better seen previously.  IMPRESSION: 1. No significant or enlarging pneumothorax.  Unchanged positioning of left chest tube. 2. Left-sided pleural fluid and atelectasis marginally increased from yesterday.   Original Report Authenticated By: Tiburcio Pea    Anti-infectives: Anti-infectives   None      Assessment/Plan: s/p  Continue to try to improve nutrition. Make sure X-rays have been done at some point to his left shoulder.  LOS:  4 days   Marta Lamas. Gae Bon, MD, FACS [phone] Trauma Surgeon 07/04/2013

## 2013-07-04 NOTE — Progress Notes (Signed)
Patient's sister called again today.  Her name is Lorina Rabon, she lives in [place], phone # [phone]

## 2013-07-05 DIAGNOSIS — R0902 Hypoxemia: Secondary | ICD-10-CM

## 2013-07-05 NOTE — Progress Notes (Signed)
Trauma Service Note  Subjective: Patient resting on his left side, in a posey because of agitation.  Complained of pain when awakened.  IS only up to 500  Objective: Vital signs in last 24 hours: Temp:  [97.5 F (36.4 C)-99 F (37.2 C)] 97.5 F (36.4 C) (08/02 0422) Pulse Rate:  [63-79] 64 (08/02 0422) Resp:  [22-29] 24 (08/02 0422) BP: (107-145)/(59-80) 107/59 mmHg (08/02 0422) SpO2:  [96 %-100 %] 96 % (08/02 0422) Last BM Date: 07/04/13  Intake/Output from previous day: 08/01 0701 - 08/02 0700 In: 480 [P.O.:480] Out: 2725 [Urine:2725] Intake/Output this shift: Total I/O In: -  Out: 1400 [Urine:1400]  General: No acute distress until you awaken him and he desaturates easily  Lungs: Diminished bilaterally.  CXR okay yesterday with known left basilar atelectasis.  Abd: Benign with good bowel sounds.  Extremities: No changes  Neuro: Oriented, arousable, but still seems to get confused and agitated.  Lab Results: CBC  No results found for this basename: WBC, HGB, HCT, PLT,  in the last 72 hours BMET No results found for this basename: NA, K, CL, CO2, GLUCOSE, BUN, CREATININE, CALCIUM,  in the last 72 hours PT/INR No results found for this basename: LABPROT, INR,  in the last 72 hours ABG No results found for this basename: PHART, PCO2, PO2, HCO3,  in the last 72 hours  Studies/Results: Dg Chest 2 View  07/04/2013   *RADIOLOGY REPORT*  Clinical Data: Follow up left hemothorax  CHEST - 2 VIEW  Comparison: 07/03/2013  Findings: Cardiomediastinal silhouette is stable.  Right lung is clear.  Again noted multiple displaced left rib fractures. Persistent small partially loculated left hemothorax with left lower lobe atelectasis, infiltrate or lung contusion.  No diagnostic pneumothorax.  No pulmonary edema.  IMPRESSION: Right lung is clear.  Again noted multiple displaced left rib fractures.  Persistent small partially loculated left hemothorax with left lower lobe atelectasis,  infiltrate or lung contusion.  No diagnostic pneumothorax.  No pulmonary edema.   Original Report Authenticated By: Natasha Mead, M.D.   Dg Chest Port 1 View  07/03/2013   *RADIOLOGY REPORT*  Clinical Data: Status post chest tube removal.  PORTABLE CHEST - 1 VIEW  Comparison: Chest x-ray 07/03/2013.  Findings: Previously noted chest tube in the upper left hemithorax has been removed.  No appreciable pneumothorax at this time. Multiple displaced left-sided rib fractures are again noted. Opacities throughout the left mid to lower hemithorax are compatible with residual hemothorax and resolving areas of subsegmental atelectasis and/or air space consolidation in the left base.  Right lung appears clear.  No evidence of pulmonary edema. Heart size is within normal limits.  Upper mediastinal contours are unremarkable.  IMPRESSION: 1.  Status post removal of left-sided chest tube.  No pneumothorax at this time. 2.  Multiple displaced left-sided rib fractures redemonstrated with areas of atelectasis and/or consolidation throughout the left mid to lower lung with superimposed moderate left hemothorax.   Original Report Authenticated By: Trudie Reed, M.D.    Anti-infectives: Anti-infectives   None      Assessment/Plan: s/p  Patient needs SNF level care. Currently he is not safe to move out of the 3300 SDu because of frequent desaturation and poor ventilation.  Also agitation and disorientation puts him at risk for falls  LOS: 5 days   Marta Lamas. Gae Bon, MD, FACS [phone] Trauma Surgeon 07/05/2013

## 2013-07-06 DIAGNOSIS — F10239 Alcohol dependence with withdrawal, unspecified: Secondary | ICD-10-CM

## 2013-07-06 MED ORDER — LORAZEPAM 2 MG/ML IJ SOLN
1.0000 mg | Freq: Three times a day (TID) | INTRAMUSCULAR | Status: DC
  Administered 2013-07-06 (×2): 1 mg via INTRAVENOUS
  Filled 2013-07-06 (×2): qty 1

## 2013-07-06 NOTE — Progress Notes (Signed)
Trauma Service Note  Subjective: Patient is sleepy, but can respond.  About to eat breakfast.  Objective: Vital signs in last 24 hours: Temp:  [98.4 F (36.9 C)-98.9 F (37.2 C)] 98.5 F (36.9 C) (08/03 0700) Pulse Rate:  [64-77] 71 (08/03 0332) Resp:  [25-28] 25 (08/03 0332) BP: (121-148)/(68-84) 123/68 mmHg (08/03 0332) SpO2:  [96 %-100 %] 97 % (08/03 0332) Last BM Date: 07/04/13  Intake/Output from previous day: 08/02 0701 - 08/03 0700 In: 900 [P.O.:300; I.V.:600] Out: 2175 [Urine:2175] Intake/Output this shift: Total I/O In: -  Out: 350 [Urine:350]  General: No distress.  Antsy.  Lungs: Clear  Abd: Soft, good bowel sounds.  Extremities: No changes  Neuro: Responds, lethargic. May need to back off on Ativan.  Lab Results: CBC  No results found for this basename: WBC, HGB, HCT, PLT,  in the last 72 hours BMET No results found for this basename: NA, K, CL, CO2, GLUCOSE, BUN, CREATININE, CALCIUM,  in the last 72 hours PT/INR No results found for this basename: LABPROT, INR,  in the last 72 hours ABG No results found for this basename: PHART, PCO2, PO2, HCO3,  in the last 72 hours  Studies/Results: Dg Chest 2 View  07/04/2013   *RADIOLOGY REPORT*  Clinical Data: Follow up left hemothorax  CHEST - 2 VIEW  Comparison: 07/03/2013  Findings: Cardiomediastinal silhouette is stable.  Right lung is clear.  Again noted multiple displaced left rib fractures. Persistent small partially loculated left hemothorax with left lower lobe atelectasis, infiltrate or lung contusion.  No diagnostic pneumothorax.  No pulmonary edema.  IMPRESSION: Right lung is clear.  Again noted multiple displaced left rib fractures.  Persistent small partially loculated left hemothorax with left lower lobe atelectasis, infiltrate or lung contusion.  No diagnostic pneumothorax.  No pulmonary edema.   Original Report Authenticated By: Natasha Mead, M.D.    Anti-infectives: Anti-infectives   None       Assessment/Plan: s/p  Advance diet Reduce amount of ativan he is getting.  LOS: 6 days   Marta Lamas. Gae Bon, MD, FACS [phone] Trauma Surgeon 07/06/2013

## 2013-07-07 ENCOUNTER — Inpatient Hospital Stay (HOSPITAL_COMMUNITY): Payer: Medicare Other

## 2013-07-07 MED ORDER — SPIRITUS FRUMENTI
1.0000 | Freq: Three times a day (TID) | ORAL | Status: DC
  Administered 2013-07-07 (×2): 1 via ORAL
  Filled 2013-07-07 (×8): qty 1

## 2013-07-07 MED ORDER — DIAZEPAM 5 MG PO TABS
5.0000 mg | ORAL_TABLET | Freq: Three times a day (TID) | ORAL | Status: DC
  Administered 2013-07-07 (×3): 5 mg via ORAL
  Filled 2013-07-07 (×3): qty 1

## 2013-07-07 MED ORDER — DEXTROSE-NACL 5-0.45 % IV SOLN
INTRAVENOUS | Status: DC
  Administered 2013-07-07 – 2013-07-09 (×2): via INTRAVENOUS
  Filled 2013-07-07 (×6): qty 1000

## 2013-07-07 MED ORDER — LORAZEPAM 2 MG/ML IJ SOLN: 0.5000 mg | INTRAMUSCULAR | Status: DC | PRN

## 2013-07-07 NOTE — Progress Notes (Signed)
This patient has been seen and I agree with the findings and treatment plan.  Issaac Shipper O. Zanden Colver, III, MD, FACS [phone] (pager) [phone] (direct pager) Trauma Surgeon  

## 2013-07-07 NOTE — Clinical Social Work Note (Signed)
Clinical Social Work Department BRIEF PSYCHOSOCIAL ASSESSMENT 07/07/2013  Patient:  Zachary [name],Zachary [name]     Account Number:  [id number]     Admit date:  06/30/2013  Clinical Social Worker:  Verl Blalock  Date/Time:  07/07/2013 04:24 PM  Referred by:  Physician  Date Referred:  07/07/2013 Referred for  SNF Placement   Other Referral:   Interview type:  Other - See comment Other interview type:   Patient with no living immediate family - patient family friend Zachary and Zachary [name] have agreed to assume responsibility for patient placement needs.    PSYCHOSOCIAL DATA Living Status:  FRIEND(S) Admitted from facility:   Level of care:   Primary support name:  Zachary [name] [phone]  Zachary [name] [phone] Primary support relationship to patient:  FRIEND Degree of support available:   Adequate    CURRENT CONCERNS Current Concerns  Post-Acute Placement   Other Concerns:    SOCIAL WORK ASSESSMENT / PLAN Clinical Social Worker spoke with patient family friends over the phone to offer support and further discuss patient plans at discharge.  Patient was initially discharged home with home health following, however was readmitted for continuous medical needs.  CSW has spoken with patient family friend who states that patient is a high fall risk at home and is unsafe with no one to care for his needs. Patient family friend states that she has been to [facility] Department of Social Services and submitted [facility] application.  Patient family friend has also been to visit [facility] and would like for placement for patient at this facility.  CSW has completed referral and spoken to facility over the phone to notify of interest.  CSW will await return call to extend potential bed offer.  CSW remains available for support and to facilitate patient discharge needs once medically ready.   Assessment/plan status:  Psychosocial Support/Ongoing Assessment of Needs Other assessment/  plan:   Information/referral to community resources:   Clinical Social Worker unable to provide additional resources at this time.  Patient family friends have been proactive in locating a facility and completing long term Medicaid application through Social Services in [facility]    PATIENT'S/FAMILY'S RESPONSE TO PLAN OF CARE: Patient remains alert to self only at this time.  Patient has been deemed to lack capacity for decision making per psychiatry service.  Patient family friends state that he is unsafe at home and will need SNF placement.  Patient family friend very supportive and proactive in process. Patient family friend expressed their appreciation for support and involvement from CSW.

## 2013-07-07 NOTE — Progress Notes (Signed)
Patient ID: Zachary [name], male   DOB: [birth year], 72 y.o.   MRN: [id number]   LOS: 7 days   Subjective: C/o left chest wall pain.   Objective: Vital signs in last 24 hours: Temp:  [98 F (36.7 C)-99.7 F (37.6 C)] 99 F (37.2 C) (08/04 0712) Pulse Rate:  [66-80] 77 (08/04 0344) Resp:  [13-36] 13 (08/04 0344) BP: (127-146)/(60-89) 146/86 mmHg (08/04 0344) SpO2:  [94 %-98 %] 96 % (08/04 0344) Last BM Date: 07/04/13   IS:   Physical Exam General appearance: alert and no distress Resp: wheezes bilaterally, tachypneic Cardio: regular rate and rhythm GI: normal findings: bowel sounds normal and soft, non-tender Neuro: Ox2 (2002) but knew situation   Assessment/Plan: Fall  Mult left rib fxs w/delayed HTX s/p CT -- Pulmonary toilet, check CXR ABL anemia -- Stable  EtOH -- Given liklihood of needing SNF placement at this point will wean beer over the next few days. Will change scheduled benzo to Valium (longer acting) to try and prevent peak/trough issues. FEN -- Continue IVF  VTE -- SCD's, Lovenox  Dispo -- Unsure if there is family to facilitate SNF placement but will need to be off alcohol and out of restraints. Other option is to return home with partner. Will d/w team at rounds today.    Freeman Caldron, PA-C Pager: [phone] General Trauma PA Pager: [phone]   07/07/2013

## 2013-07-07 NOTE — Progress Notes (Signed)
Physical Therapy Treatment Patient Details Name: Zachary [name] MRN: [id number] DOB: 08/06/1941 Today's Date: 07/07/2013 Time: 8469-6295 PT Time Calculation (min): 25 min  PT Assessment / Plan / Recommendation  History of Present Illness 72 year old who recently d/c'd home after admission for fall with L 6-9 rib fx's.  Returns this admission due to SOB with CT showing hemothorax, s/p chest tube.   PT Comments   Pt limited today by decr cognition and confusion.  Follow Up Recommendations  SNF     Does the patient have the potential to tolerate intense rehabilitation     Barriers to Discharge        Equipment Recommendations  Rolling walker with 5" wheels    Recommendations for Other Services    Frequency Min 2X/week   Progress towards PT Goals Progress towards PT goals: Not progressing toward goals - comment (due to cognition.)  Plan Current plan remains appropriate;Frequency needs to be updated    Precautions / Restrictions Precautions Precautions: Fall   Pertinent Vitals/Pain VSS    Mobility  Bed Mobility Supine to Sit: 1: +2 Total assist Supine to Sit: Patient Percentage: 50% Sitting - Scoot to Edge of Bed: 1: +2 Total assist Sitting - Scoot to Edge of Bed: Patient Percentage: 50% Sit to Supine: 1: +2 Total assist Sit to Supine: Patient Percentage: 50% Details for Bed Mobility Assistance: Assist to initiate task.  Assist to bring trunk up. Transfers Sit to Stand: 1: +2 Total assist;From bed Sit to Stand: Patient Percentage: 50% Stand to Sit: 1: +2 Total assist;To bed Stand to Sit: Patient Percentage: 50% Details for Transfer Assistance: Pt with posterior lean and with flexed posture.  Cues to stand more erect. Ambulation/Gait Ambulation/Gait Assistance: Not tested (comment) (Pt unwilling/unable.) General Gait Details: When attempting to have pt amb pt began sitting back down.    Exercises     PT Diagnosis:    PT Problem List:   PT Treatment Interventions:      PT Goals (current goals can now be found in the care plan section)    Visit Information  Last PT Received On: 07/07/13 Assistance Needed: +2 History of Present Illness: 72 year old who recently d/c'd home after admission for fall with L 6-9 rib fx's.  Returns this admission due to SOB with CT showing hemothorax, s/p chest tube.    Subjective Data      Cognition  Cognition Arousal/Alertness: Awake/alert Behavior During Therapy: Flat affect Orientation Level: Disoriented to;Place;Time;Situation Current Attention Level: Sustained Memory: Decreased recall of precautions Following Commands: Follows one step commands inconsistently Safety/Judgement: Decreased awareness of safety;Decreased awareness of deficits Problem Solving: Slow processing;Difficulty sequencing;Requires verbal cues;Requires tactile cues    Balance     End of Session PT - End of Session Equipment Utilized During Treatment: Gait belt;Oxygen Activity Tolerance: Other (comment) (coginition.) Patient left: in bed;with call bell/phone within reach;with bed alarm set;with restraints reapplied Nurse Communication: Mobility status   GP     [facility] 07/07/2013, 3:33 PM  [facility] PT [phone]

## 2013-07-07 NOTE — Clinical Social Work Placement (Addendum)
Clinical Social Work Department CLINICAL SOCIAL WORK PLACEMENT NOTE 07/07/2013  Patient:  Zachary [name], Zachary [name]  Account Number:  [id number] Admit date:  06/30/2013  Clinical Social Worker:  Macario Golds, LCSW  Date/time:  07/07/2013 04:30 PM  Clinical Social Work is seeking post-discharge placement for this patient at the following level of care:   SKILLED NURSING   (*CSW will update this form in Epic as items are completed)   07/07/2013  Patient/family provided with [facility] Department of Clinical Social Work's list of facilities offering this level of care within the geographic area requested by the patient (or if unable, by the patient's family).  07/07/2013  Patient/family informed of their freedom to choose among providers that offer the needed level of care, that participate in Medicare, Medicaid or managed care program needed by the patient, have an available bed and are willing to accept the patient.  07/07/2013  Patient/family informed of [facility]' ownership interest in [facility], as well as of the fact that they are under no obligation to receive care at this facility.  PASARR submitted to EDS on 07/07/2013 PASARR number received from EDS on 07/07/2013  FL2 transmitted to all facilities in geographic area requested by pt/family on  07/07/2013 FL2 transmitted to all facilities within larger geographic area on   Patient informed that his/her managed care company has contracts with or will negotiate with  certain facilities, including the following:     Patient/family informed of bed offers received:  07/08/2013 Patient chooses bed at  [facility] Physician recommends and patient chooses bed at    Patient to be transferred to [facility] on 07/14/2013   Patient to be transferred to facility by  Ambulance  The following physician request were entered in Epic:   Additional Comments: 08/04 - preference to [facility]

## 2013-07-08 ENCOUNTER — Inpatient Hospital Stay (HOSPITAL_COMMUNITY): Payer: Medicare Other

## 2013-07-08 ENCOUNTER — Encounter (HOSPITAL_COMMUNITY): Payer: Self-pay

## 2013-07-08 DIAGNOSIS — S271XXA Traumatic hemothorax, initial encounter: Secondary | ICD-10-CM

## 2013-07-08 LAB — BLOOD GAS, ARTERIAL
Acid-Base Excess: 6.8 mmol/L — ABNORMAL HIGH (ref 0.0–2.0)
Drawn by: 32470
Expiratory PAP: 6
FIO2: 40 %
Inspiratory PAP: 14
Mode: POSITIVE
O2 Saturation: 94.7 %
pCO2 arterial: 57.7 mmHg (ref 35.0–45.0)

## 2013-07-08 MED ORDER — SPIRITUS FRUMENTI
1.0000 | Freq: Three times a day (TID) | ORAL | Status: DC
  Administered 2013-07-08 – 2013-07-09 (×2): 1 via ORAL
  Filled 2013-07-08 (×6): qty 1

## 2013-07-08 MED ORDER — IOHEXOL 350 MG/ML SOLN
100.0000 mL | Freq: Once | INTRAVENOUS | Status: AC | PRN
  Administered 2013-07-08: 100 mL via INTRAVENOUS

## 2013-07-08 MED ORDER — DIAZEPAM 5 MG PO TABS
7.5000 mg | ORAL_TABLET | Freq: Three times a day (TID) | ORAL | Status: DC
  Administered 2013-07-08 – 2013-07-09 (×6): 7.5 mg via ORAL
  Filled 2013-07-08: qty 1
  Filled 2013-07-08 (×5): qty 2
  Filled 2013-07-08: qty 1

## 2013-07-08 MED ORDER — MIDAZOLAM HCL 2 MG/2ML IJ SOLN
INTRAMUSCULAR | Status: AC
  Filled 2013-07-08: qty 4

## 2013-07-08 MED ORDER — FENTANYL CITRATE 0.05 MG/ML IJ SOLN
INTRAMUSCULAR | Status: AC
  Filled 2013-07-08: qty 4

## 2013-07-08 NOTE — Progress Notes (Signed)
Patient has not changed very much at all.  Will make an attempt to contact his sister.  This patient has been seen and I agree with the findings and treatment plan.  Marta Lamas. Gae Bon, MD, FACS [phone] (pager) [phone] (direct pager) Trauma Surgeon

## 2013-07-08 NOTE — Progress Notes (Signed)
Upon arrival to unit RN observered pt attempting OOB, restless, and confused. Pt helped back into bed RN will cont to monitor pt for saftey

## 2013-07-08 NOTE — Consult Note (Signed)
[address]       [place] [address]             [phone]                    Aiken Withem [facility] Medical Record #[id number] Date of Birth: 1941/04/27  Referring:Dr ThompsonPrimary Care: Aura Dials, MD  Chief Complaint:    Chief Complaint  Patient presents with  . Shortness of Breath    History of Present Illness:    72 yo patient admitted 7/20-26  with multiple left rib fractures. He returned 7/27  With a larrge left effusion and a left chest tube was placed. The tube was removed  7/31. Patient has had intermittent difficulty with respiratory status with alcohol withdrawal and confusion. Thoracic surgery consulted today       Current Activity/ Functional Status:  Patient is not independent with mobility/ambulation, transfers, ADL's, IADL's.    Zubrod Score: At the time of surgery this patient's most appropriate activity status/level should be described as: []  Normal activity, no symptoms []  Symptoms, fully ambulatory []  Symptoms, in bed less than or equal to 50% of the time []  Symptoms, in bed greater than 50% of the time but less than 100% [x]  Bedridden []  Moribund   Past Medical History  Diagnosis Date  . Hypertension   . Coronary artery disease   . Hyperlipemia   . Angina   . Shortness of breath   . Mental disorder   . Arthritis   . COPD (chronic obstructive pulmonary disease)   . Hiatal hernia   . Depression   . Gout   . Joint pain   . Depression   . Anxiety     Past Surgical History  Procedure Laterality Date  . Abdominal surgery      History reviewed. No pertinent family history.  History   Social History  . Marital Status: Single    Spouse Name: N/A    Number of Children: N/A  . Years of Education: N/A   Occupational History  . Not on file.   Social History Main Topics  . Smoking status: Current Every Day Smoker -- 1.00 packs/day for 15 years    Types: Cigarettes  . Smokeless tobacco: Former  Neurosurgeon  . Alcohol Use: 7.2 oz/week    12 Cans of beer per week     Comment: pt heavy drinker, pt stets at lest 2-3 40oz per day  . Drug Use: No     Comment: HX OF COCAINE USE STATES HE QUIT THAT 2012  . Sexually Active: Not on file   Other Topics Concern  . Not on file   Social History Narrative  . No narrative on file    History  Smoking status  . Current Every Day Smoker -- 1.00 packs/day for 15 years  . Types: Cigarettes  Smokeless tobacco  . Former User    History  Alcohol Use  . 7.2 oz/week  . 12 Cans of beer per week    Comment: pt heavy drinker, pt stets at lest 2-3 40oz per day     No Known Allergies  Current Facility-Administered Medications  Medication Dose Route Frequency Provider Last Rate Last Dose  . albuterol (PROVENTIL HFA;VENTOLIN HFA) 108 (90 BASE) MCG/ACT inhaler 2 puff  2 puff Inhalation Q6H PRN Atilano Ina, MD   2 puff at 07/06/13 1429  . alum & mag hydroxide-simeth (MAALOX/MYLANTA) 200-200-20 MG/5ML suspension 30 mL  30 mL Oral Q6H PRN Atilano Ina, MD   30 mL at 07/05/13 1206  . bisacodyl (DULCOLAX) suppository 10 mg  10 mg Rectal Daily PRN Atilano Ina, MD   10 mg at 07/04/13 1025  . dextrose 5 % and 0.45% NaCl 1,000 mL infusion   Intravenous Continuous Freeman Caldron, PA-C 50 mL/hr at 07/07/13 1800    . diazepam (VALIUM) tablet 7.5 mg  7.5 mg Oral TID Megan Dort, PA-C   7.5 mg at 07/08/13 1227  . docusate sodium (COLACE) capsule 100 mg  100 mg Oral BID Atilano Ina, MD   100 mg at 07/07/13 2253  . enoxaparin (LOVENOX) injection 40 mg  40 mg Subcutaneous Q24H Atilano Ina, MD   40 mg at 07/08/13 0843  . folic acid (FOLVITE) tablet 1 mg  1 mg Oral Daily Atilano Ina, MD   1 mg at 07/07/13 1124  . gabapentin (NEURONTIN) capsule 100 mg  100 mg Oral TID Atilano Ina, MD   100 mg at 07/07/13 2249  . lactulose (CHRONULAC) 10 GM/15ML solution 20 g  20 g Oral Daily PRN Freeman Caldron, PA-C   20 g at 07/02/13 1813  . LORazepam (ATIVAN)  injection 0.5 mg  0.5 mg Intravenous Q4H PRN Freeman Caldron, PA-C      . metoprolol tartrate (LOPRESSOR) tablet 25 mg  25 mg Oral BID Atilano Ina, MD   25 mg at 07/08/13 1228  . morphine 2 MG/ML injection 2 mg  2 mg Intravenous Q4H PRN Freeman Caldron, PA-C   2 mg at 07/08/13 1056  . multivitamin with minerals tablet 1 tablet  1 tablet Oral Daily Atilano Ina, MD   1 tablet at 07/07/13 1124  . ondansetron (ZOFRAN) tablet 4 mg  4 mg Oral Q6H PRN Atilano Ina, MD       Or  . ondansetron [facility]) injection 4 mg  4 mg Intravenous Q6H PRN Atilano Ina, MD      . spiritus frumenti (ethyl alcohol) solution 1 each  1 each Oral TID WC Megan Dort, PA-C      . thiamine (VITAMIN B-1) tablet 100 mg  100 mg Oral Daily Atilano Ina, MD   100 mg at 07/07/13 1124  . traMADol (ULTRAM) tablet 50-100 mg  50-100 mg Oral Q6H PRN Freeman Caldron, PA-C   50 mg at 07/07/13 4098       Review of Systems: confused unable to give any history    Cardiac Review of Systems: Y or N  Chest Pain [    ]  Resting SOB [   ] Exertional SOB  [  ]  Orthopnea [  ]   Pedal Edema [   ]    Palpitations [  ] Syncope  [  ]   Presyncope [   ]  General Review of Systems: [Y] = yes [  ]=no Constitional: recent weight change [  ]; anorexia [  ]; fatigue [  ]; nausea [  ]; night sweats [  ]; fever [  ]; or chills [  ];  Dental: poor dentition[  ]; Last Dentist visit:   Eye : blurred vision [  ]; diplopia [   ]; vision changes [  ];  Amaurosis fugax[  ]; Resp: cough [  ];  wheezing[  ];  hemoptysis[  ]; shortness of breath[  ]; paroxysmal nocturnal dyspnea[  ]; dyspnea on exertion[  ]; or orthopnea[  ];  GI:  gallstones[  ], vomiting[  ];  dysphagia[  ]; melena[  ];  hematochezia [  ]; heartburn[  ];   Hx of  Colonoscopy[  ]; GU: kidney stones [  ]; hematuria[  ];   dysuria [  ];  nocturia[   ];  history of     obstruction [  ]; urinary frequency [  ]             Skin: rash, swelling[  ];, hair loss[  ];  peripheral edema[  ];  or itching[  ]; Musculosketetal: myalgias[  ];  joint swelling[  ];  joint erythema[  ];  joint pain[  ];  back pain[  ];  Heme/Lymph: bruising[  ];  bleeding[  ];  anemia[  ];  Neuro: TIA[  ];  headaches[  ];  stroke[  ];  vertigo[  ];  seizures[  ];   paresthesias[  ];  difficulty walking[  ];  Psych:depression[  ]; anxiety[  ];  Endocrine: diabetes[  ];  thyroid dysfunction[  ];  Immunizations: Flu [  ]; Pneumococcal[  ];  Other:  Physical Exam: BP 143/83  Pulse 94  Temp(Src) 98 F (36.7 C) (Axillary)  Resp 34  Ht 5\' 6"  (1.676 m)  Wt 142 lb 3.2 oz (64.5 kg)  BMI 22.96 kg/m2  SpO2 94%  General appearance: appears older than stated age, combative, delirious, distracted, moderate distress and slowed mentation Neurologic: intact Heart: regular rate and rhythm, S1, S2 normal, no murmur, click, rub or gallop Lungs: diminished breath sounds LLL Abdomen: soft, non-tender; bowel sounds normal; no masses,  no organomegaly Extremities: extremities normal, atraumatic, no cyanosis or edema and Homans sign is negative, no sign of DVT   Diagnostic Studies & Laboratory data:     Recent Radiology Findings:   Dg Chest 2 View  07/07/2013   *RADIOLOGY REPORT*  Clinical Data: Shortness of breath, cough, congestion.  The patient has a recent history of multiple rib fractures  CHEST - 2 VIEW  Comparison: July 04, 2013  Findings: The right lung is clear.  The left lung base is not included.  The evaluation of the included the left lung baseare limited due to overlying leads.  There are fractures of several lateral left lower ribs unchanged.  There is consolidation of left lung base with left pleural effusion unchanged.  The mediastinal contour and  cardiac silhouette are stable.  The previously described small loculated pneumothorax is not as well appreciated.   IMPRESSION: Persistent left lung base consolidation with left pleural effusion with several lateral lower left rib fractures unchanged.  The previously described loculated left pneumothorax is not seen on today's exam.   Original Report Authenticated By: Sherian Rein, M.D.   Ct Angio Chest Pe W/cm &/or Wo Cm  07/08/2013   *RADIOLOGY REPORT*  Clinical Data: Labored breathing, chest pain.  CT ANGIOGRAPHY CHEST  Technique:  Multidetector CT imaging of the chest using the standard protocol during bolus administration of intravenous contrast. Multiplanar reconstructed images including MIPs were obtained and reviewed to evaluate the vascular anatomy.  Contrast: OMNIPAQUE  IOHEXOL 350 MG/ML SOLN  Comparison: Chest CT 06/30/2013  Findings: Again noted is the large left hemothorax.  This is slightly decreased in size since prior study, but it is now partially loculated superiorly and laterally.  Higher density material noted inferiorly within the left posterior pleural space, presumably blood clot.  Fractures through the sixth through tenth left ribs again noted.  Heart is normal size. Aorta is normal caliber.  Coronary artery calcifications are noted.  No filling defects in the pulmonary arteries to suggest pulmonary emboli.  Compressive atelectasis within the left lower lobe.  No confluent opacity on the right.  Imaging into the upper abdomen shows no acute findings.  Gallstones layering within the gallbladder.  IMPRESSION: Continued large left hemothorax which is now increasing in the loculation superiorly and laterally.  Compressive atelectasis in the left lower lobe.  Coronary artery disease.  No evidence of pulmonary embolus.  Cholelithiasis.   Original Report Authenticated By: Charlett Nose, M.D.   Dg Chest Port 1 View  07/08/2013   *RADIOLOGY REPORT*  Clinical Data: Trauma with multiple left rib fractures.  Follow up left pleural effusion/hemothorax and left basilar atelectasis.  PORTABLE CHEST - 1 VIEW   Comparison: Two-view chest x-ray yesterday, 07/04/2013.  Portable chest x-ray 07/03/2013.  CT chest 06/30/2013.  Findings: Stable small to moderate-sized left pleural effusion/hemothorax and stable dense consolidation in the left lower lobe.  Lungs remain clear otherwise with no new pulmonary parenchymal abnormalities.  Cardiac silhouette upper normal in size for technique, unchanged.  Pulmonary vascularity normal.  Multiple left lower rib fractures again noted.  IMPRESSION: Stable left pleural effusion/hemarthrosis and dense left lower lobe atelectasis and/or pneumonia.  No new abnormalities.   Original Report Authenticated By: Hulan Saas, M.D.      Recent Lab Findings: Lab Results  Component Value Date   WBC 11.2* 07/02/2013   HGB 9.8* 07/02/2013   HCT 29.8* 07/02/2013   PLT 275 07/02/2013   GLUCOSE 90 07/01/2013   CHOL 127 12/08/2012   TRIG 64 12/08/2012   HDL 56 12/08/2012   LDLCALC 58 12/08/2012   ALT 27 07/01/2013   AST 36 07/01/2013   NA 138 07/01/2013   K 4.0 07/01/2013   CL 105 07/01/2013   CREATININE 0.96 07/01/2013   BUN 17 07/01/2013   CO2 29 07/01/2013   TSH 0.832 06/27/2013   INR 1.14 06/30/2013   HGBA1C 5.1 05/19/2013      Assessment / Plan:    left chest trauma with multiple left rib fracture Recurrent left pleural effusion/hemothorax after fall rib fractures 7/20 while on Lovenox  Suggest placement of left chest tube, NOW Hold Lovenox   And use pas hose Follow up chest xray     Delight Ovens MD      [address] [place] [address] Office [phone]   Beeper [phone]  07/08/2013 4:49 PM

## 2013-07-08 NOTE — Procedures (Signed)
Chest Tube Insertion Procedure Note  Indications:  Clinically significant Hemothorax  Pre-operative Diagnosis: Hemothorax and rib fractures  Post-operative Diagnosis: Hemothorax and Effusion  Procedure Details  Informed consent was obtained for the procedure, including sedation.  Risks of lung perforation, hemorrhage, arrhythmia, and adverse drug reaction were discussed.   After sterile skin prep, using standard technique, a 28 French tube was placed in the left lateral 8 rib space.  Findings: 1100 ml of serosanguinous fluid obtained, old blood   Estimated Blood Loss:  Minimal         Specimens:  None              Complications:  None; patient tolerated the procedure well.         Disposition: stay current room         Condition: stable  Attending Attestation: I performed the procedure.

## 2013-07-08 NOTE — Progress Notes (Signed)
LOS: 8 days   Subjective: Pt alert today but disoriented, asking for beer and wanting to watch television.  Still in posey belt and mittens.  Pt keeps attempting to get OOB without assistance when off restraints.  He's urinating and having BM's, but last one was a few days ago.  States his appetite is low.  Objective: Vital signs in last 24 hours: Temp:  [97.7 F (36.5 C)-99.7 F (37.6 C)] 97.7 F (36.5 C) (08/05 0410) Pulse Rate:  [70-99] 79 (08/05 0410) Resp:  [28-38] 32 (08/05 0410) BP: (130-159)/(76-84) 159/79 mmHg (08/05 0410) SpO2:  [91 %-94 %] 94 % (08/05 0410) Last BM Date: 07/04/13  Lab Results:  CBC No results found for this basename: WBC, HGB, HCT, PLT,  in the last 72 hours BMET No results found for this basename: NA, K, CL, CO2, GLUCOSE, BUN, CREATININE, CALCIUM,  in the last 72 hours  Imaging: Dg Chest 2 View  07/07/2013   *RADIOLOGY REPORT*  Clinical Data: Shortness of breath, cough, congestion.  The patient has a recent history of multiple rib fractures  CHEST - 2 VIEW  Comparison: July 04, 2013  Findings: The right lung is clear.  The left lung base is not included.  The evaluation of the included the left lung baseare limited due to overlying leads.  There are fractures of several lateral left lower ribs unchanged.  There is consolidation of left lung base with left pleural effusion unchanged.  The mediastinal contour and  cardiac silhouette are stable.  The previously described small loculated pneumothorax is not as well appreciated.  IMPRESSION: Persistent left lung base consolidation with left pleural effusion with several lateral lower left rib fractures unchanged.  The previously described loculated left pneumothorax is not seen on today's exam.   Original Report Authenticated By: Sherian Rein, M.D.   IS:  500-717mL   PE: General appearance: alert and no distress  Resp: wheezes bilaterally, tachypneic, left chest wall ttp Cardio: regular rate and rhythm  GI:  bowel sounds normal and soft, non-tender  Neuro: Ox2 (2012) but knew situation, current president, name and location   Assessment/Plan: Fall  Mult left rib fxs w/delayed HTX s/p CT -- Pulmonary toilet ABL anemia -- Stable  EtOH -- Given liklihood of needing SNF placement at this point will wean beer (3/day, had 4 yesterday) over the next few days. Will change scheduled benzo to Valium increase to 7.5mg  (longer acting) to try and prevent peak/trough issues.  FEN -- Continue IVF  VTE -- SCD's, Lovenox  Psych -- deemed to have lack of capacity for decision making Dispo -- Unsure if there is family to facilitate SNF placement but will need to be off alcohol and out of restraints. Will continue restraints for 4 additional hours and re-assess.  Other option is to return home with partner.     Aris Georgia, PA-C Pager: [phone] General Trauma PA Pager: [phone]   07/08/2013

## 2013-07-08 NOTE — Progress Notes (Signed)
RN called to rm of pt by nursing staff, RN observered pt to have labored bx, RR 30-40, c/o tightness in chest, 3L O2 replaced, PA/N, verbal nursing orders received for prn morphine, MD called after admin of morphine due to no change in resp status, new orders received nursing will cont to monitor

## 2013-07-08 NOTE — Clinical Social Work Note (Signed)
Clinical Social Worker continuing to follow patient and family for support and discharge planning needs.  CSW has made contact with patient sister, Zachary [name], who lives in [place].  Patient sister states that she does not know anything about patient girlfriend, but due to lack of family in the area has provided verbal permission to have patient girlfriend and family sign him into facility.  CSW has spoken with Zachary [name] who is willing to extend a bed offer once patient has been fully weaned from alcohol protocol and out of restraints for greater than 24 hours.  CSW has updated patient girlfriend daughter of current status.  CSW remains available for support and to facilitate patient discharge needs once medically ready.  Macario Golds, [place] [phone]

## 2013-07-09 ENCOUNTER — Inpatient Hospital Stay (HOSPITAL_COMMUNITY): Payer: Medicare Other

## 2013-07-09 DIAGNOSIS — J189 Pneumonia, unspecified organism: Secondary | ICD-10-CM | POA: Diagnosis not present

## 2013-07-09 DIAGNOSIS — S271XXA Traumatic hemothorax, initial encounter: Secondary | ICD-10-CM

## 2013-07-09 DIAGNOSIS — E43 Unspecified severe protein-calorie malnutrition: Secondary | ICD-10-CM | POA: Diagnosis present

## 2013-07-09 LAB — CBC
MCH: 32.8 pg (ref 26.0–34.0)
MCV: 100.8 fL — ABNORMAL HIGH (ref 78.0–100.0)
Platelets: 434 10*3/uL — ABNORMAL HIGH (ref 150–400)
RDW: 13.8 % (ref 11.5–15.5)

## 2013-07-09 LAB — BASIC METABOLIC PANEL
BUN: 9 mg/dL (ref 6–23)
Calcium: 9.1 mg/dL (ref 8.4–10.5)
Creatinine, Ser: 0.77 mg/dL (ref 0.50–1.35)
GFR calc Af Amer: >90 mL/min (ref >90)
GFR calc non Af Amer: 90 mL/min — ABNORMAL LOW (ref >90)
Glucose, Bld: 173 mg/dL — ABNORMAL HIGH (ref 70–99)
Potassium: 4.2 mEq/L (ref 3.5–5.1)

## 2013-07-09 MED ORDER — VANCOMYCIN HCL IN DEXTROSE 750-5 MG/150ML-% IV SOLN
750.0000 mg | Freq: Two times a day (BID) | INTRAVENOUS | Status: AC
  Administered 2013-07-09 – 2013-07-13 (×9): 750 mg via INTRAVENOUS
  Filled 2013-07-09 (×9): qty 150

## 2013-07-09 MED ORDER — KETOROLAC TROMETHAMINE 30 MG/ML IJ SOLN
30.0000 mg | Freq: Once | INTRAMUSCULAR | Status: AC
  Administered 2013-07-09: 30 mg via INTRAVENOUS
  Filled 2013-07-09: qty 1

## 2013-07-09 MED ORDER — DEXTROSE 5 % IV SOLN
1.0000 g | Freq: Three times a day (TID) | INTRAVENOUS | Status: DC
  Administered 2013-07-09 – 2013-07-14 (×16): 1 g via INTRAVENOUS
  Filled 2013-07-09 (×18): qty 1

## 2013-07-09 MED ORDER — DRONABINOL 2.5 MG PO CAPS
2.5000 mg | ORAL_CAPSULE | Freq: Two times a day (BID) | ORAL | Status: DC
  Administered 2013-07-09 – 2013-07-14 (×11): 2.5 mg via ORAL
  Filled 2013-07-09 (×12): qty 1

## 2013-07-09 MED ORDER — DOCUSATE SODIUM 100 MG PO CAPS
200.0000 mg | ORAL_CAPSULE | Freq: Two times a day (BID) | ORAL | Status: DC
  Administered 2013-07-09 – 2013-07-14 (×10): 200 mg via ORAL
  Filled 2013-07-09 (×2): qty 2
  Filled 2013-07-09: qty 1
  Filled 2013-07-09 (×4): qty 2
  Filled 2013-07-09: qty 1
  Filled 2013-07-09 (×2): qty 2

## 2013-07-09 MED ORDER — POLYETHYLENE GLYCOL 3350 17 G PO PACK
17.0000 g | PACK | Freq: Every day | ORAL | Status: DC
  Administered 2013-07-09 – 2013-07-14 (×6): 17 g via ORAL
  Filled 2013-07-09 (×6): qty 1

## 2013-07-09 MED ORDER — SPIRITUS FRUMENTI
1.0000 | Freq: Two times a day (BID) | ORAL | Status: DC
  Administered 2013-07-09 – 2013-07-10 (×2): 1 via ORAL
  Filled 2013-07-09 (×5): qty 1

## 2013-07-09 MED ORDER — HYDROMORPHONE HCL PF 1 MG/ML IJ SOLN
INTRAMUSCULAR | Status: AC
  Administered 2013-07-09: 1 mg via INTRAVENOUS
  Filled 2013-07-09: qty 1

## 2013-07-09 MED ORDER — HYDROMORPHONE HCL PF 1 MG/ML IJ SOLN
1.0000 mg | INTRAMUSCULAR | Status: DC | PRN
  Administered 2013-07-09: 1 mg via INTRAVENOUS
  Filled 2013-07-09: qty 1

## 2013-07-09 MED ORDER — HYDROMORPHONE HCL PF 1 MG/ML IJ SOLN
0.5000 mg | INTRAMUSCULAR | Status: DC | PRN
  Administered 2013-07-10 – 2013-07-14 (×3): 0.5 mg via INTRAVENOUS
  Filled 2013-07-09 (×3): qty 1

## 2013-07-09 NOTE — Progress Notes (Addendum)
      [address]       Zachary [name] [address]             [phone]            Subjective: Patient with secretions, cough this am.  Objective: Vital signs in last 24 hours: Temp:  [97.9 F (36.6 C)-100 F (37.8 C)] 97.9 F (36.6 C) (08/06 0747) Pulse Rate:  [76-109] 109 (08/06 0747) Cardiac Rhythm:  [-] Sinus tachycardia (08/06 0747) Resp:  [20-42] 20 (08/06 0747) BP: (128-149)/(71-83) 142/77 mmHg (08/06 0747) SpO2:  [98 %-100 %] 100 % (08/06 0747)     Intake/Output from previous day: 08/05 0701 - 08/06 0700 In: 1390 [P.O.:240; I.V.:1150] Out: 2835 [Urine:1575; Chest Tube:1260]   Physical Exam:  Cardiovascular: Tachycardic Pulmonary: Coarse breath sounds L>R Abdomen: Soft, non tender, bowel sounds present. Extremities: No lower extremity edema. Wounds: Dressing is clean and dry.   Chest Tube: to suction, no air leak, old blood in pleura vac  Lab Results: CBC: Recent Labs  07/09/13 0544  WBC 14.3*  HGB 11.9*  HCT 36.6*  PLT 434*   BMET: No results found for this basename: NA, K, CL, CO2, GLUCOSE, BUN, CREATININE, CALCIUM,  in the last 72 hours  PT/INR:  Recent Labs  07/09/13 0544  LABPROT 13.4  INR 1.04   ABG:  INR: Will add last result for INR, ABG once components are confirmed Will add last 4 CBG results once components are confirmed  Assessment/Plan:  1. CV - Tachy. On Lopressor 25 bid. 2.  Pulmonary - Had left chest tube placed yesterday for hemothorax. Chest tube with 1260 cc (only 70 cc last 12 hours) of dark bloody output since placement.Chest tube is to suction and there is no air leak. CXR this am shows no pneumothorax, small left pleural effusion and atelectasis. Encourage incentive spirometer and flutter valve. 3. H and H stable at 11.9 and 36.6 4.Management per trauma  Zachary [name] MPA-C 07/09/2013,9:17 AM  Off of anticoagulation secondary to re bleed in chest I have seen and examined Zachary [name] and  agree with the above assessment  and plan.  Delight Ovens MD Beeper [phone] Office [phone] 07/09/2013 5:22 PM

## 2013-07-09 NOTE — Progress Notes (Signed)
Patient examined and I agree with the assessment and plan Appreciate TCTS assistance. Violeta Gelinas, MD, MPH, FACS Pager: [phone]  07/09/2013 12:30 PM

## 2013-07-09 NOTE — Progress Notes (Signed)
UR done. 

## 2013-07-09 NOTE — Progress Notes (Signed)
ANTIBIOTIC CONSULT NOTE - INITIAL  Pharmacy Consult for Vancomycin/Cefepime Indication: HCAP  No Known Allergies  Patient Measurements: Height: 5\' 6"  (167.6 cm) Weight: 142 lb 3.2 oz (64.5 kg) IBW/kg (Calculated) : 63.8  Vital Signs: Temp: 100.2 F (37.9 C) (08/06 1106) Temp src: Oral (08/06 1106) BP: 142/77 mmHg (08/06 0747) Pulse Rate: 109 (08/06 0747) Intake/Output from previous day: 08/05 0701 - 08/06 0700 In: 1390 [P.O.:240; I.V.:1150] Out: 2835 [Urine:1575; Chest Tube:1260] Labs:  Recent Labs  07/09/13 0544  WBC 14.3*  HGB 11.9*  PLT 434*   Estimated Creatinine Clearance: 64.6 ml/min (by C-G formula based on Cr of 0.96). No results found for this basename: VANCOTROUGH, Leodis Binet, VANCORANDOM, GENTTROUGH, GENTPEAK, GENTRANDOM, TOBRATROUGH, TOBRAPEAK, TOBRARND, AMIKACINPEAK, AMIKACINTROU, AMIKACIN,  in the last 72 hours   Microbiology: Recent Results (from the past 720 hour(s))  MRSA PCR SCREENING     Status: None   Collection Time    06/22/13  2:37 PM      Result Value Range Status   MRSA by PCR NEGATIVE  NEGATIVE Final   Comment:            The GeneXpert MRSA Assay (FDA     approved for NASAL specimens     only), is one component of a     comprehensive MRSA colonization     surveillance program. It is not     intended to diagnose MRSA     infection nor to guide or     monitor treatment for     MRSA infections.    Medical History: Past Medical History  Diagnosis Date  . Hypertension   . Coronary artery disease   . Hyperlipemia   . Angina   . Shortness of breath   . Mental disorder   . Arthritis   . COPD (chronic obstructive pulmonary disease)   . Hiatal hernia   . Depression   . Gout   . Joint pain   . Depression   . Anxiety    Assessment: 72 y/o M with HCAP. CXR with worsening aeration in the left lung base despite L-sided chest tube placement to alleviate hemothorax. WBC 14.3<11.2. Tmax 100.2. CrCl ~60-70 from old BMET (order for new one  today in place).  Goal of Therapy:  Vancomycin trough level 15-20 mcg/ml  Plan:  -Vancomycin 750 mg IV q12h -Cefepime 1g IV q8h -F/U labs -Trend WBC, temp, renal function -F/U micro/radiologic data  Thank you for allowing me to take part in this patient's care,  Abran Duke, PharmD Clinical Pharmacist Phone: [phone] Pager: [phone] 07/09/2013 11:27 AM

## 2013-07-09 NOTE — Progress Notes (Signed)
Patient ID: Zachary [name], male   DOB: 16-Jun-1941, 72 y.o.   MRN: [id number]   LOS: 9 days   Subjective: No c/o.   Objective: Vital signs in last 24 hours: Temp:  [97.9 F (36.6 C)-100 F (37.8 C)] 97.9 F (36.6 C) (08/06 0747) Pulse Rate:  [76-109] 109 (08/06 0747) Resp:  [20-42] 20 (08/06 0747) BP: (128-149)/(71-83) 142/77 mmHg (08/06 0747) SpO2:  [98 %-100 %] 100 % (08/06 0747) Last BM Date: 07/04/13   IS: (=)   CT No air leak 1223ml/insertion @1230ml    Laboratory  CBC  Recent Labs  07/09/13 0544  WBC 14.3*  HGB 11.9*  HCT 36.6*  PLT 434*   Lab Results  Component Value Date   INR 1.04 07/09/2013   INR 1.14 06/30/2013   INR 1.09 06/22/2013    Radiology Results PORTABLE CHEST - 1 VIEW  Comparison: Chest x-ray 04/2013.  Findings: Left-sided chest tube is similarly positioned with tip in  the medial aspect of the mid to upper left hemithorax. No  appreciable left sided pneumothorax is noted at this time.  Multiple mildly displaced left-sided rib fractures are again noted.  Small left pleural effusion. Increasing ill-defined interstitial  and airspace opacities in the left lower lobe. No evidence of  pulmonary edema. Heart size is normal. The patient is rotated to  the right on today's exam, resulting in distortion of the  mediastinal contours and reduced diagnostic sensitivity and  specificity for mediastinal pathology.  IMPRESSION:  1. Left-sided chest tube remains similarly positioned. Small left  pleural effusion. No appreciable pneumothorax.  2. Worsening aeration in the left lung base where there is ill-  defined interstitial and airspace opacities, which may reflect some  developing re-expansion pulmonary edema.  Original Report Authenticated By: Trudie Reed, M.D.   Physical Exam General appearance: alert and no distress Resp: rhonchi anterior - left and wheezes anterior - right Cardio: regular rate and rhythm GI: normal findings:  bowel sounds normal and soft, non-tender Neuro: Ox2 but not quite as alert as yesterday   Assessment/Plan: Fall  Mult left rib fxs w/delayed HTX s/p CT x2  -- Pulmonary toilet, appreciate TS consult and CT placement ABL anemia -- Stable  EtOH -- Continue to wean beer, keep valium at current level. Out of restraints currently ID -- Continues with LLL consolidation despite CT. Afebrile but WBC elevated today. 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 Will try Marinol to stimulate appetite. Increase bowel regimen as no BM since 8/1. VTE -- SCD's, Lovenox being held secondary to recurrent HTX  Dispo -- Planning for SNF placement once stable. Continue SDU with confusion.    Freeman Caldron, PA-C Pager: [phone] General Trauma PA Pager: [phone]   07/09/2013

## 2013-07-10 ENCOUNTER — Inpatient Hospital Stay (HOSPITAL_COMMUNITY): Payer: Medicare Other

## 2013-07-10 LAB — CBC
Hemoglobin: 11.2 g/dL — ABNORMAL LOW (ref 13.0–17.0)
MCH: 32.6 pg (ref 26.0–34.0)
MCHC: 32.7 g/dL (ref 30.0–36.0)
Platelets: 385 10*3/uL (ref 150–400)
RDW: 13.7 % (ref 11.5–15.5)

## 2013-07-10 LAB — BASIC METABOLIC PANEL
Calcium: 8.8 mg/dL (ref 8.4–10.5)
GFR calc Af Amer: >90 mL/min (ref >90)
GFR calc non Af Amer: 89 mL/min — ABNORMAL LOW (ref >90)
Glucose, Bld: 111 mg/dL — ABNORMAL HIGH (ref 70–99)
Potassium: 3.9 mEq/L (ref 3.5–5.1)
Sodium: 136 mEq/L (ref 135–145)

## 2013-07-10 MED ORDER — SPIRITUS FRUMENTI
1.0000 | Freq: Every day | ORAL | Status: DC
  Filled 2013-07-10: qty 1

## 2013-07-10 MED ORDER — DIAZEPAM 5 MG PO TABS
5.0000 mg | ORAL_TABLET | Freq: Three times a day (TID) | ORAL | Status: DC
  Administered 2013-07-10 – 2013-07-14 (×13): 5 mg via ORAL
  Filled 2013-07-10 (×14): qty 1

## 2013-07-10 MED ORDER — DEXTROSE-NACL 5-0.45 % IV SOLN
INTRAVENOUS | Status: DC
  Administered 2013-07-10 – 2013-07-14 (×2): via INTRAVENOUS
  Filled 2013-07-10 (×8): qty 1000

## 2013-07-10 NOTE — Progress Notes (Signed)
      [address]       [name] [address]             [phone]            Subjective: Patient with secretions, cough this am.  Objective: Vital signs in last 24 hours: Temp:  [97.5 F (36.4 C)-100.2 F (37.9 C)] 97.9 F (36.6 C) (08/07 0716) Pulse Rate:  [60-109] 80 (08/07 0420) Cardiac Rhythm:  [-] Normal sinus rhythm (08/07 0420) Resp:  [16-31] 31 (08/07 0420) BP: (111-160)/(58-85) 145/74 mmHg (08/07 0420) SpO2:  [2 %-100 %] 100 % (08/07 0420)     Intake/Output from previous day: 08/06 0701 - 08/07 0700 In: 1962.5 [P.O.:80; I.V.:1582.5; IV Piggyback:300] Out: 180 [Urine:100; Chest Tube:80]   Physical Exam:  Cardiovascular: Tachycardic Pulmonary: Coarse breath sounds L>R Abdomen: Soft, non tender, bowel sounds present. Extremities: No lower extremity edema. Wounds: Dressing is clean and dry.   Chest Tube: to suction, no air leak, old blood in pleura vac  Lab Results: CBC:  Recent Labs  07/09/13 0544 07/10/13 0500  WBC 14.3* 11.4*  HGB 11.9* 11.2*  HCT 36.6* 34.2*  PLT 434* 385   BMET:   Recent Labs  07/09/13 1210 07/10/13 0500  NA 138 136  K 4.2 3.9  CL 101 101  CO2 31 31  GLUCOSE 173* 111*  BUN 9 9  CREATININE 0.77 0.79  CALCIUM 9.1 8.8    PT/INR:   Recent Labs  07/09/13 0544  LABPROT 13.4  INR 1.04   ABG:  INR: Will add last result for INR, ABG once components are confirmed Will add last 4 CBG results once components are confirmed  Assessment/Plan:  1. CV - Tachy. On Lopressor 25 bid. 2.  Pulmonary - Had left chest tube placed yesterday for hemothorax. Chest tube with 1260 cc (only 70 cc last 12 hours) of dark bloody output since placement.Chest tube is to suction and there is no air leak. CXR this am shows no pneumothorax, small left pleural effusion and atelectasis. Encourage incentive spirometer and flutter valve. 3. H and H stable at 11.9 and 36.6 4.Management per trauma  Zachary [name],Zachary [name]  MPA-C 07/10/2013,8:19 AM  Off of anticoagulation secondary to re bleed in chest I have seen and examined Zachary [name] and agree with the above assessment  and plan.  Zachary Ovens MD Beeper [phone] Office [phone] 07/10/2013 8:19 AM

## 2013-07-10 NOTE — Clinical Social Work Note (Signed)
Clinical Social Worker continuing to follow patient and family for support and discharge planning needs.  Patient does have a bed at [facility] once medically ready.  Patient remains out of restraints, however still drinking beer and with a new chest tube.  CSW will continue to follow for support and to facilitate patient discharge needs once medically stable.  Macario Golds, [place] [phone]

## 2013-07-10 NOTE — Progress Notes (Addendum)
Patient ID: Zachary [name], male   DOB: [birth year], 72 y.o.   MRN: [id number]    Subjective: Offers no complaints  Objective: Vital signs in last 24 hours: Temp:  [97.5 F (36.4 C)-100.2 F (37.9 C)] 97.9 F (36.6 C) (08/07 0716) Pulse Rate:  [60-109] 80 (08/07 0420) Resp:  [16-31] 31 (08/07 0420) BP: (111-160)/(58-85) 145/74 mmHg (08/07 0420) SpO2:  [2 %-100 %] 100 % (08/07 0420) Last BM Date: 07/04/13  Intake/Output from previous day: 08/06 0701 - 08/07 0700 In: 1962.5 [P.O.:80; I.V.:1582.5; IV Piggyback:300] Out: 180 [Urine:100; Chest Tube:80] Intake/Output this shift: Total I/O In: -  Out: 750 [Urine:750]  General appearance: cooperative Resp: mild wheeze R>L Cardio: regular rate and rhythm GI: soft, NT Extremities: no edema Neuro: knows he is in a hospital, F/C  Lab Results: CBC   Recent Labs  07/09/13 0544 07/10/13 0500  WBC 14.3* 11.4*  HGB 11.9* 11.2*  HCT 36.6* 34.2*  PLT 434* 385   BMET  Recent Labs  07/09/13 1210 07/10/13 0500  NA 138 136  K 4.2 3.9  CL 101 101  CO2 31 31  GLUCOSE 173* 111*  BUN 9 9  CREATININE 0.77 0.79  CALCIUM 9.1 8.8   PT/INR  Recent Labs  07/09/13 0544  LABPROT 13.4  INR 1.04   ABG  Recent Labs  07/08/13 1128  PHART 7.364  HCO3 32.1*    Studies/Results: Ct Angio Chest Pe W/cm &/or Wo Cm  07/08/2013   *RADIOLOGY REPORT*  Clinical Data: Labored breathing, chest pain.  CT ANGIOGRAPHY CHEST  Technique:  Multidetector CT imaging of the chest using the standard protocol during bolus administration of intravenous contrast. Multiplanar reconstructed images including MIPs were obtained and reviewed to evaluate the vascular anatomy.  Contrast: OMNIPAQUE IOHEXOL 350 MG/ML SOLN  Comparison: Chest CT 06/30/2013  Findings: Again noted is the large left hemothorax.  This is slightly decreased in size since prior study, but it is now partially loculated superiorly and laterally.  Higher density material noted  inferiorly within the left posterior pleural space, presumably blood clot.  Fractures through the sixth through tenth left ribs again noted.  Heart is normal size. Aorta is normal caliber.  Coronary artery calcifications are noted.  No filling defects in the pulmonary arteries to suggest pulmonary emboli.  Compressive atelectasis within the left lower lobe.  No confluent opacity on the right.  Imaging into the upper abdomen shows no acute findings.  Gallstones layering within the gallbladder.  IMPRESSION: Continued large left hemothorax which is now increasing in the loculation superiorly and laterally.  Compressive atelectasis in the left lower lobe.  Coronary artery disease.  No evidence of pulmonary embolus.  Cholelithiasis.   Original Report Authenticated By: Charlett Nose, M.D.   Dg Chest Port 1 View  07/10/2013   *RADIOLOGY REPORT*  Clinical Data: Post chest tube for a hemothorax.  PORTABLE CHEST - 1 VIEW  Comparison: 07/09/2013.  Findings: Left-sided chest tube is in place.  No gross pneumothorax noted.  Opacification left lower lobe may represent residual pleural effusions/atelectasis.  Remote and acute left-sided rib fractures.  Cardiomegaly.  Tortuous aorta.  Pulmonary vascular prominence.  IMPRESSION: No significant change.  Please see above.   Original Report Authenticated By: Lacy Duverney, M.D.   Dg Chest Port 1 View  07/09/2013   *RADIOLOGY REPORT*  Clinical Data: Postoperative evaluation of chest tube.  PORTABLE CHEST - 1 VIEW  Comparison: Chest x-ray 04/2013.  Findings: Left-sided chest tube is similarly positioned with  tip in the medial aspect of the mid to upper left hemithorax.  No appreciable left sided pneumothorax is noted at this time. Multiple mildly displaced left-sided rib fractures are again noted. Small left pleural effusion.  Increasing ill-defined interstitial and airspace opacities in the left lower lobe.  No evidence of pulmonary edema.  Heart size is normal. The patient is rotated  to the right on today's exam, resulting in distortion of the mediastinal contours and reduced diagnostic sensitivity and specificity for mediastinal pathology.  IMPRESSION: 1.  Left-sided chest tube remains similarly positioned.  Small left pleural effusion.  No appreciable pneumothorax. 2.  Worsening aeration in the left lung base where there is ill- defined interstitial and airspace opacities, which may reflect some developing re-expansion pulmonary edema.   Original Report Authenticated By: Trudie Reed, M.D.   Dg Chest Port 1 View  07/08/2013   *RADIOLOGY REPORT*  Clinical Data: Postoperative evaluation of chest tube placement.  PORTABLE CHEST - 1 VIEW  Comparison: Chest x-ray 07/08/2013.  Findings: Multiple displaced left-sided rib fractures are again noted.  Compared to the prior examination in the size of the left- sided pleural fluid collection (hemothorax) has decreased, now likely a small to moderate (the left inferior costophrenic sulcus was incompletely visualized on these images) no definite pneumothorax identified at this time.  Left-sided chest tube in position with tip in the medial aspect of the upper left hemithorax.  Right lung appears clear.  No evidence of pulmonary edema.  Heart size is normal. The patient is rotated to the left on today's exam, resulting in distortion of the mediastinal contours and reduced diagnostic sensitivity and specificity for mediastinal pathology.  Atherosclerosis in the thoracic aorta.  IMPRESSION: 1.  Decreased size of the left-sided hemothorax following placement of a large bore left-sided chest tube. 2.  Multiple mildly displaced left-sided rib fractures redemonstrated. 3.  Atherosclerosis.   Original Report Authenticated By: Trudie Reed, M.D.   Dg Chest Port 1 View  07/08/2013   *RADIOLOGY REPORT*  Clinical Data: Trauma with multiple left rib fractures.  Follow up left pleural effusion/hemothorax and left basilar atelectasis.  PORTABLE CHEST - 1 VIEW   Comparison: Two-view chest x-ray yesterday, 07/04/2013.  Portable chest x-ray 07/03/2013.  CT chest 06/30/2013.  Findings: Stable small to moderate-sized left pleural effusion/hemothorax and stable dense consolidation in the left lower lobe.  Lungs remain clear otherwise with no new pulmonary parenchymal abnormalities.  Cardiac silhouette upper normal in size for technique, unchanged.  Pulmonary vascularity normal.  Multiple left lower rib fractures again noted.  IMPRESSION: Stable left pleural effusion/hemarthrosis and dense left lower lobe atelectasis and/or pneumonia.  No new abnormalities.   Original Report Authenticated By: Hulan Saas, M.D.    Anti-infectives: Anti-infectives   Start     Dose/Rate Route Frequency Ordered Stop   07/09/13 1230  vancomycin (VANCOCIN) IVPB 750 mg/150 ml premix     750 mg 150 mL/hr over 60 Minutes Intravenous Every 12 hours 07/09/13 1128     07/09/13 1200  ceFEPIme (MAXIPIME) 1 g in dextrose 5 % 50 mL IVPB     1 g 100 mL/hr over 30 Minutes Intravenous 3 times per day 07/09/13 1128        Assessment/Plan: Fall  Mult left rib fxs w/delayed HTX s/p CT x2  -- Pulmonary toilet, appreciate TS consult and CT placement, possible they will place on H2O seal today ABL anemia -- Stable  EtOH -- wean down to 1 beer today, too sedated after valium  yesterday so decrease to 5mg  againl. Out of restraints currently ID -- resp CXs P, empiric vanc/cefepime FEN -- watching Oral intake VTE -- SCD's, Lovenox being held secondary to recurrent HTX  Dispo -- Planning for SNF placement once stable. 73M now with replaced CT   LOS: 10 days    Violeta Gelinas, MD, MPH, FACS Pager: [phone]  07/10/2013

## 2013-07-10 NOTE — Progress Notes (Signed)
Patient is lethargic but will arouse with verbal and physical contact.Breathing is labored with periods of apena. Rapid response called. No change in vitals signs.

## 2013-07-10 NOTE — Progress Notes (Signed)
Pt's heart rate increased and sustained greater than 140 for approximately 5 mins then returned to a sinus rythum in the 80's. Vital signs maintained within normal limits at during this episode. MD notified no new orders received at this time, will continue to monitor.

## 2013-07-10 NOTE — Progress Notes (Signed)
Physical Therapy Treatment Patient Details Name: Zachary [name] MRN: [id number] DOB: [birth year] Today's Date: 07/10/2013 Time: 4098-1191 PT Time Calculation (min): 12 min  PT Assessment / Plan / Recommendation  History of Present Illness 72 year old who recently d/c'd home after admission for fall with L 6-9 rib fx's.  Returns this admission due to SOB with CT showing hemothorax, s/p chest tube.   PT Comments   Pt admitted with SOB and hemothorax. Pt currently with functional limitations due to soreness from chest tube, weakness and poor endurance.  Will benefit from continued PT.   Pt will benefit from skilled PT to increase their independence and safety with mobility to allow discharge to the venue listed below.   Follow Up Recommendations  SNF                 Equipment Recommendations  Rolling walker with 5" wheels        Frequency Min 2X/week   Progress towards PT Goals Progress towards PT goals: Progressing toward goals  Plan Current plan remains appropriate    Precautions / Restrictions Precautions Precautions: Fall Restrictions Weight Bearing Restrictions: No   Pertinent Vitals/Pain VSS, no pain    Mobility  Bed Mobility Bed Mobility: Not assessed Details for Bed Mobility Assistance: Pt only agreeable to perform UE and LE exercises. Transfers Transfers: Not assessed Ambulation/Gait Ambulation/Gait Assistance: Not tested (comment) Stairs: No Wheelchair Mobility Wheelchair Mobility: No    Exercises General Exercises - Upper Extremity Shoulder Flexion: AAROM;Both;10 reps;Supine Shoulder Extension: AROM;Both;10 reps;Supine Shoulder ABduction: AAROM;Left;10 reps;Supine Elbow Flexion: AAROM;Both;10 reps;Supine General Exercises - Lower Extremity Ankle Circles/Pumps: AROM;Both;10 reps;Supine Long Arc Quad: AROM;10 reps;Both;Supine Heel Slides: AROM;Both;10 reps;Supine Hip ABduction/ADduction: AROM;Both;10 reps;Supine    PT Goals (current goals can now be  found in the care plan section) Acute Rehab PT Goals Patient Stated Goal: not stated  Visit Information  Last PT Received On: 07/10/13 Assistance Needed: +2 History of Present Illness: 72 year old who recently d/c'd home after admission for fall with L 6-9 rib fx's.  Returns this admission due to SOB with CT showing hemothorax, s/p chest tube.    Subjective Data  Subjective: Pt grunted a lot.   Patient Stated Goal: not stated   Cognition  Cognition Arousal/Alertness: Awake/alert Behavior During Therapy: Flat affect Overall Cognitive Status: Impaired/Different from baseline Area of Impairment: Attention;Following commands;Safety/judgement;Awareness;Problem solving Orientation Level: Disoriented to;Place;Time;Situation Current Attention Level: Sustained Memory: Decreased recall of precautions Following Commands: Follows one step commands inconsistently Safety/Judgement: Decreased awareness of safety;Decreased awareness of deficits Awareness: Intellectual Problem Solving: Slow processing;Difficulty sequencing;Requires verbal cues;Requires tactile cues General Comments: No family available to detrmine baseline level of functioning.  See comments under ADLs    Balance     End of Session PT - End of Session Equipment Utilized During Treatment: Gait belt;Oxygen Activity Tolerance: Patient limited by fatigue Patient left: in bed;with call bell/phone within reach;with bed alarm set;with restraints reapplied Nurse Communication: Mobility status        INGOLD,Nathaneil Feagans 07/10/2013, 3:50 PM [facility] [phone] (pager)

## 2013-07-11 ENCOUNTER — Inpatient Hospital Stay (HOSPITAL_COMMUNITY): Payer: Medicare Other

## 2013-07-11 DIAGNOSIS — I498 Other specified cardiac arrhythmias: Secondary | ICD-10-CM

## 2013-07-11 DIAGNOSIS — S271XXA Traumatic hemothorax, initial encounter: Secondary | ICD-10-CM

## 2013-07-11 LAB — CBC
HCT: 38.4 % — ABNORMAL LOW (ref 39.0–52.0)
Hemoglobin: 12.3 g/dL — ABNORMAL LOW (ref 13.0–17.0)
MCHC: 32 g/dL (ref 30.0–36.0)
MCV: 100.3 fL — ABNORMAL HIGH (ref 78.0–100.0)
RDW: 13.8 % (ref 11.5–15.5)

## 2013-07-11 MED ORDER — DILTIAZEM HCL 30 MG PO TABS
30.0000 mg | ORAL_TABLET | Freq: Four times a day (QID) | ORAL | Status: DC
  Administered 2013-07-11 – 2013-07-14 (×10): 30 mg via ORAL
  Filled 2013-07-11 (×16): qty 1

## 2013-07-11 MED ORDER — METOPROLOL TARTRATE 25 MG PO TABS
25.0000 mg | ORAL_TABLET | Freq: Three times a day (TID) | ORAL | Status: DC
  Administered 2013-07-11 – 2013-07-14 (×9): 25 mg via ORAL
  Filled 2013-07-11 (×11): qty 1

## 2013-07-11 NOTE — Progress Notes (Signed)
Patient ID: Zachary [name], male   DOB: 16-May-1941, 72 y.o.   MRN: [id number]    Subjective: Offers no complaint. Another run of asymptomatic SVT overnight. Eating much better per staff.  Objective: Vital signs in last 24 hours: Temp:  [97.9 F (36.6 C)-99.7 F (37.6 C)] 98 F (36.7 C) (08/08 0757) Pulse Rate:  [63-88] 74 (08/08 0400) Resp:  [19-33] 30 (08/08 0400) BP: (99-135)/(57-81) 109/66 mmHg (08/08 0400) SpO2:  [93 %-100 %] 100 % (08/08 0400) Last BM Date: 07/04/13  Intake/Output from previous day: 08/07 0701 - 08/08 0700 In: 150 [I.V.:150] Out: 2275 [Urine:2275] Intake/Output this shift: Total I/O In: -  Out: 450 [Urine:450]  General appearance: cooperative Resp: clear after cough Cardio: regular rate and rhythm GI: soft, NT, ND Extremities: no edema Neuro: F/C well but disoriented to time  Lab Results: CBC   Recent Labs  07/10/13 0500 07/11/13 0635  WBC 11.4* 9.4  HGB 11.2* 12.3*  HCT 34.2* 38.4*  PLT 385 361   BMET  Recent Labs  07/09/13 1210 07/10/13 0500  NA 138 136  K 4.2 3.9  CL 101 101  CO2 31 31  GLUCOSE 173* 111*  BUN 9 9  CREATININE 0.77 0.79  CALCIUM 9.1 8.8   PT/INR  Recent Labs  07/09/13 0544  LABPROT 13.4  INR 1.04   ABG  Recent Labs  07/08/13 1128  PHART 7.364  HCO3 32.1*    Studies/Results: Dg Chest Port 1 View  07/11/2013   *RADIOLOGY REPORT*  Clinical Data: Left-sided chest tube.  PORTABLE CHEST - 1 VIEW  Comparison: 07/10/2013.  Findings: Left thoracostomy tube remains present.  There is no pneumothorax identified.  Left rib fractures better appreciated on prior exams. Monitoring leads are projected over the chest. Cardiopericardial silhouette appears within normal limits. Tortuous thoracic aorta.  The hazy opacity at the left lung base remains present, compatible with loculated pleural fluid.  No soft tissue emphysema.  IMPRESSION: No interval change.  Stable position of the left thoracostomy tube. No  pneumothorax.   Original Report Authenticated By: Andreas Newport, M.D.   Dg Chest Port 1 View  07/10/2013   *RADIOLOGY REPORT*  Clinical Data: Post chest tube for a hemothorax.  PORTABLE CHEST - 1 VIEW  Comparison: 07/09/2013.  Findings: Left-sided chest tube is in place.  No gross pneumothorax noted.  Opacification left lower lobe may represent residual pleural effusions/atelectasis.  Remote and acute left-sided rib fractures.  Cardiomegaly.  Tortuous aorta.  Pulmonary vascular prominence.  IMPRESSION: No significant change.  Please see above.   Original Report Authenticated By: Lacy Duverney, M.D.    Anti-infectives: Anti-infectives   Start     Dose/Rate Route Frequency Ordered Stop   07/09/13 1230  vancomycin (VANCOCIN) IVPB 750 mg/150 ml premix     750 mg 150 mL/hr over 60 Minutes Intravenous Every 12 hours 07/09/13 1128     07/09/13 1200  ceFEPIme (MAXIPIME) 1 g in dextrose 5 % 50 mL IVPB     1 g 100 mL/hr over 30 Minutes Intravenous 3 times per day 07/09/13 1128        Assessment/Plan: Fall  Mult left rib fxs w/delayed HTX s/p CT x2  -- Pulmonary toilet, appreciate TCTS consult and CT placement, on H2O seal today ABL anemia -- Stable  SVT -- now has had several runs, will check 12 lead and have cardiology evaluate EtOH -- D/C beer ID -- resp CXs few yeast so far, empiric vanc/cefepime d4, WBC has come down  on abx so will plan 7d therapy FEN -- Oral intake is good now VTE -- SCD's, Lovenox being held secondary to recurrent HTX  Dispo -- Planning for SNF placement once stable. 7M now with SVT   LOS: 11 days    Violeta Gelinas, MD, MPH, FACS Pager: [phone]  07/11/2013

## 2013-07-11 NOTE — Progress Notes (Signed)
  Echocardiogram 2D Echocardiogram has been performed.  Bishop Vanderwerf FRANCES 07/11/2013, 4:04 PM

## 2013-07-11 NOTE — Progress Notes (Signed)
Physical Therapy Treatment Patient Details Name: Zachary [name] MRN: [id number] DOB: January 08, 1941 Today's Date: 07/11/2013 Time: 1105-1130 PT Time Calculation (min): 25 min  PT Assessment / Plan / Recommendation  History of Present Illness 72 year old who recently d/c'd home after admission for fall with L 6-9 rib fx's.  Returns this admission due to SOB with CT showing hemothorax, s/p chest tube.   PT Comments   Pt admitted with fall with complications after fall. Pt currently with functional limitations due to poor cognition, poor balance reactions, poor safety awareness and poor endurance.   Pt will benefit from skilled PT to increase their independence and safety with mobility to allow discharge to the venue listed below.   Follow Up Recommendations  SNF                 Equipment Recommendations  Rolling walker with 5" wheels        Frequency Min 2X/week   Progress towards PT Goals Progress towards PT goals: Progressing toward goals  Plan Current plan remains appropriate    Precautions / Restrictions Precautions:  Chest tube Precautions: Fall Restrictions Weight Bearing Restrictions: No   Pertinent Vitals/Pain HR 84 -155 bpm with activity, RR 28-45 - nursing aware.  Chest pain at chest tube site not rated per pt.      Mobility  Bed Mobility Bed Mobility: Rolling Left;Left Sidelying to Sit;Sitting - Scoot to Edge of Bed Rolling Left: 4: Min guard;With rail Left Sidelying to Sit: 4: Min assist;HOB elevated;With rails Supine to Sit: 4: Min assist Sitting - Scoot to Edge of Bed: 4: Min assist Sit to Supine: Not Tested (comment) Details for Bed Mobility Assistance: Pt needed asssit for elevation of trunk. Transfers Transfers: Sit to Stand;Stand to Sit Sit to Stand: 1: +2 Total assist;With upper extremity assist;From bed Sit to Stand: Patient Percentage: 50% Stand to Sit: 1: +2 Total assist;To bed Stand to Sit: Patient Percentage: 40% Details for Transfer Assistance: Took  pt two attempts to achieve upright stance.  Pt with posterior lean and with flexed posture.  Cues to stand more erect.  Pt had a very hard time achieving upright posture.  Had to steady entire time as he could not achieve upright and continued to lean posteriorly.   Ambulation/Gait Ambulation/Gait Assistance: 1: +2 Total assist Ambulation/Gait: Patient Percentage: 60% Ambulation Distance (Feet): 16 Feet Assistive device: Rolling walker Ambulation/Gait Assistance Details: Pt ambulated with RW with max cues and max assist at times to incr anterior lean as he constantly leans posteriorly.  Pt needed tactile cuing constantly secondary to posterior lean.  Needed max cuing and assist to steer RW and sequence steps.  Second attempt at ambulation ended when pt refused secondary he wanted to go a different way and when PT agreed to go that way he got upset and said he just "wasnt' going to walk".  Pt's HR up to 152 from 84 bpm and nursing came in and applied O2 and checked his chest tube dressing as his RR was also up to 45.  Feel pt just got anxious and was upset due to pain as his VS stabilized quickly.   Gait Pattern: Step-through pattern;Decreased step length - right;Decreased step length - left;Decreased stride length;Wide base of support;Trunk flexed Gait velocity: slow Stairs: No Wheelchair Mobility Wheelchair Mobility: No      PT Goals (current goals can now be found in the care plan section)    Visit Information  Last PT Received On: 07/11/13 Assistance Needed: +  2 History of Present Illness: 72 year old who recently d/c'd home after admission for fall with L 6-9 rib fx's.  Returns this admission due to SOB with CT showing hemothorax, s/p chest tube.    Subjective Data  Subjective: "I am not going to do more."   Cognition  Cognition Arousal/Alertness: Awake/alert Behavior During Therapy: Flat affect Overall Cognitive Status: Impaired/Different from baseline Area of Impairment:  Attention;Following commands;Safety/judgement;Awareness;Problem solving Orientation Level: Disoriented to;Place;Time;Situation Current Attention Level: Sustained Memory: Decreased recall of precautions Following Commands: Follows one step commands inconsistently Safety/Judgement: Decreased awareness of safety;Decreased awareness of deficits Awareness: Intellectual Problem Solving: Slow processing;Difficulty sequencing;Requires verbal cues;Requires tactile cues General Comments: No family available to detrmine baseline level of functioning.  See comments under ADLs    Balance  Static Standing Balance Static Standing - Balance Support: Bilateral upper extremity supported;During functional activity Static Standing - Level of Assistance: 3: Mod assist Static Standing - Comment/# of Minutes: 2 min with RW with posterior lean  End of Session PT - End of Session Equipment Utilized During Treatment: Gait belt;Oxygen Activity Tolerance: Patient limited by fatigue Patient left: in chair;with call bell/phone within reach;with chair alarm set;with nursing/sitter in room;with restraints reapplied Nurse Communication: Mobility status        INGOLD,Nuchem Grattan 07/11/2013, 1:27 PM  [facility] [phone] (pager)

## 2013-07-11 NOTE — Progress Notes (Addendum)
      [address]       [name] [address]             [phone]      Subjective:  No Complaints this morning  Objective: Vital signs in last 24 hours: Temp:  [97.9 F (36.6 C)-99.7 F (37.6 C)] 98 F (36.7 C) (08/08 0800) Pulse Rate:  [63-89] 89 (08/08 0916) Cardiac Rhythm:  [-] Normal sinus rhythm (08/08 0900) Resp:  [19-33] 30 (08/08 0400) BP: (99-135)/(57-81) 124/74 mmHg (08/08 0916) SpO2:  [93 %-100 %] 100 % (08/08 0400)  Intake/Output from previous day: 08/07 0701 - 08/08 0700 In: 150 [I.V.:150] Out: 2275 [Urine:2275] Intake/Output this shift: Total I/O In: 240 [P.O.:240] Out: 475 [Urine:450; Chest Tube:25]  General appearance: alert, cooperative and no distress Heart: regular rate and rhythm Lungs: clear to auscultation bilaterally Wound: clean and dry  Lab Results:  Recent Labs  07/10/13 0500 07/11/13 0635  WBC 11.4* 9.4  HGB 11.2* 12.3*  HCT 34.2* 38.4*  PLT 385 361   BMET:  Recent Labs  07/09/13 1210 07/10/13 0500  NA 138 136  K 4.2 3.9  CL 101 101  CO2 31 31  GLUCOSE 173* 111*  BUN 9 9  CREATININE 0.77 0.79  CALCIUM 9.1 8.8    PT/INR:  Recent Labs  07/09/13 0544  LABPROT 13.4  INR 1.04   ABG    Component Value Date/Time   PHART 7.364 07/08/2013 1128   HCO3 32.1* 07/08/2013 1128   TCO2 33.9 07/08/2013 1128   O2SAT 94.7 07/08/2013 1128   CBG (last 3)  No results found for this basename: GLUCAP,  in the last 72 hours  Assessment/Plan: S/P Chest tube Placement  1. Chest tube- no air leak present, CXR is stable, 25 cc output since yesterday 2. CV- Tachy- on lopressor, Cardiology following 3. Hgb stable 4. Dispo- care per trauma, will discuss possible chest tube removal with staff    LOS: 11 days    BARRETT, ERIN 07/11/2013  Ct tube to water seal today, likely remove tomorrow I have seen and examined Erick Alley and agree with the above assessment  and plan.  Delight Ovens MD Beeper  [phone] Office [phone] 07/11/2013 6:50 PM

## 2013-07-11 NOTE — Consult Note (Signed)
Zachary [name] is an 72 y.o. male.   Chief Complaint: Frequent tachyarrhythmia HPI: 72 years old male with recurrent PVCs and paroxysmal atrial fibrillation with rapid ventricular response is admitted for fall resulting in multiple left ribs fracture and chest tube placement for resultant hemothorax.   Past Medical History  Diagnosis Date  . Hypertension   . Coronary artery disease   . Hyperlipemia   . Angina   . Shortness of breath   . Mental disorder   . Arthritis   . COPD (chronic obstructive pulmonary disease)   . Hiatal hernia   . Depression   . Gout   . Joint pain   . Depression   . Anxiety       Past Surgical History  Procedure Laterality Date  . Abdominal surgery      History reviewed. No pertinent family history. Social History:  reports that he has been smoking Cigarettes.  He has a 15 pack-year smoking history. He has quit using smokeless tobacco. He reports that he drinks about 7.2 ounces of alcohol per week. He reports that he does not use illicit drugs.  Allergies: No Known Allergies  Medications Prior to Admission  Medication Sig Dispense Refill  . albuterol (PROVENTIL HFA;VENTOLIN HFA) 108 (90 BASE) MCG/ACT inhaler Inhale 2 puffs into the lungs every 6 (six) hours as needed for wheezing.      . gabapentin (NEURONTIN) 100 MG capsule Take 100 mg by mouth 3 (three) times daily.      Marland Kitchen LORazepam (ATIVAN) 1 MG tablet Take 1 mg by mouth every 6 (six) hours as needed for anxiety.      . metoprolol tartrate (LOPRESSOR) 25 MG tablet Take 25 mg by mouth 2 (two) times daily.      . naproxen (NAPROSYN) 500 MG tablet Take 500 mg by mouth 2 (two) times daily with a meal.      . traMADol (ULTRAM) 50 MG tablet Take 50-100 mg by mouth every 6 (six) hours as needed for pain.      . traZODone (DESYREL) 50 MG tablet Take 50-100 mg by mouth at bedtime as needed for sleep. *takes an extra 50mg  if need help sleeping        Results for orders placed during the hospital encounter  of 06/30/13 (from the past 48 hour(s))  BASIC METABOLIC PANEL     Status: Abnormal   Collection Time    07/09/13 12:10 PM      Result Value Range   Sodium 138  135 - 145 mEq/L   Potassium 4.2  3.5 - 5.1 mEq/L   Chloride 101  96 - 112 mEq/L   CO2 31  19 - 32 mEq/L   Glucose, Bld 173 (*) 70 - 99 mg/dL   BUN 9  6 - 23 mg/dL   Creatinine, Ser 4.09  0.50 - 1.35 mg/dL   Calcium 9.1  8.4 - 81.1 mg/dL   GFR calc non Af Amer 90 (*) >90 mL/min   GFR calc Af Amer >90  >90 mL/min   Comment:            The eGFR has been calculated     using the CKD EPI equation.     This calculation has not been     validated in all clinical     situations.     eGFR's persistently     <90 mL/min signify     possible Chronic Kidney Disease.  CULTURE, RESPIRATORY (NON-EXPECTORATED)  Status: None   Collection Time    07/09/13  7:50 PM      Result Value Range   Specimen Description TRACHEAL ASPIRATE     Special Requests Normal     Gram Stain       Value: FEW WBC PRESENT, PREDOMINANTLY PMN     RARE SQUAMOUS EPITHELIAL CELLS PRESENT     FEW YEAST     Performed at [facility]   Culture PENDING     Report Status PENDING    CBC     Status: Abnormal   Collection Time    07/10/13  5:00 AM      Result Value Range   WBC 11.4 (*) 4.0 - 10.5 K/uL   RBC 3.44 (*) 4.22 - 5.81 MIL/uL   Hemoglobin 11.2 (*) 13.0 - 17.0 g/dL   HCT 13.0 (*) 86.5 - 78.4 %   MCV 99.4  78.0 - 100.0 fL   MCH 32.6  26.0 - 34.0 pg   MCHC 32.7  30.0 - 36.0 g/dL   RDW 69.6  29.5 - 28.4 %   Platelets 385  150 - 400 K/uL  BASIC METABOLIC PANEL     Status: Abnormal   Collection Time    07/10/13  5:00 AM      Result Value Range   Sodium 136  135 - 145 mEq/L   Potassium 3.9  3.5 - 5.1 mEq/L   Chloride 101  96 - 112 mEq/L   CO2 31  19 - 32 mEq/L   Glucose, Bld 111 (*) 70 - 99 mg/dL   BUN 9  6 - 23 mg/dL   Creatinine, Ser 1.32  0.50 - 1.35 mg/dL   Calcium 8.8  8.4 - 44.0 mg/dL   GFR calc non Af Amer 89 (*) >90 mL/min   GFR  calc Af Amer >90  >90 mL/min   Comment:            The eGFR has been calculated     using the CKD EPI equation.     This calculation has not been     validated in all clinical     situations.     eGFR's persistently     <90 mL/min signify     possible Chronic Kidney Disease.  CBC     Status: Abnormal   Collection Time    07/11/13  6:35 AM      Result Value Range   WBC 9.4  4.0 - 10.5 K/uL   RBC 3.83 (*) 4.22 - 5.81 MIL/uL   Hemoglobin 12.3 (*) 13.0 - 17.0 g/dL   HCT 10.2 (*) 72.5 - 36.6 %   MCV 100.3 (*) 78.0 - 100.0 fL   MCH 32.1  26.0 - 34.0 pg   MCHC 32.0  30.0 - 36.0 g/dL   RDW 44.0  34.7 - 42.5 %   Platelets 361  150 - 400 K/uL   Dg Chest Port 1 View  07/11/2013   *RADIOLOGY REPORT*  Clinical Data: Left-sided chest tube.  PORTABLE CHEST - 1 VIEW  Comparison: 07/10/2013.  Findings: Left thoracostomy tube remains present.  There is no pneumothorax identified.  Left rib fractures better appreciated on prior exams. Monitoring leads are projected over the chest. Cardiopericardial silhouette appears within normal limits. Tortuous thoracic aorta.  The hazy opacity at the left lung base remains present, compatible with loculated pleural fluid.  No soft tissue emphysema.  IMPRESSION: No interval change.  Stable position of the left thoracostomy  tube. No pneumothorax.   Original Report Authenticated By: Andreas Newport, M.D.   Dg Chest Port 1 View  07/10/2013   *RADIOLOGY REPORT*  Clinical Data: Post chest tube for a hemothorax.  PORTABLE CHEST - 1 VIEW  Comparison: 07/09/2013.  Findings: Left-sided chest tube is in place.  No gross pneumothorax noted.  Opacification left lower lobe may represent residual pleural effusions/atelectasis.  Remote and acute left-sided rib fractures.  Cardiomegaly.  Tortuous aorta.  Pulmonary vascular prominence.  IMPRESSION: No significant change.  Please see above.   Original Report Authenticated By: Lacy Duverney, M.D.    @ROS @ Constitutional: Negative.  HENT:  Negative.  Eyes: Negative.  Respiratory: Positive for shortness of breath.  Cardiovascular: Positive for chest pain.  Gastrointestinal: Negative.  Genitourinary: Negative.  Musculoskeletal: Negative.  Skin: Negative.  Neurological: Negative.  Endo/Heme/Allergies: Negative.  Psychiatric/Behavioral: Negative.   Physical Exam  Blood pressure 124/74, pulse 89, temperature 98 F (36.7 C), temperature source Oral, resp. rate 30, height 5\' 6"  (1.676 m), weight 64.5 kg (142 lb 3.2 oz), SpO2 100.00%. Constitutional: He appears well-developed and well-nourished.  HENT: Head: Normocephalic and atraumatic. Eyes: Conjunctivae and EOM are normal. Pupils are equal, round, and reactive to light.  Neck: Normal range of motion. Neck supple.  Cardiovascular: Normal rate, regular rhythm and normal heart sounds.  Respiratory: Increased work of breathing. Tender over left chest  GI: Bowel sounds are normal. Ventral hernia, reducible. No tenderness  Musculoskeletal: Normal range of motion.  Neurological: He is alert and oriented to person, place, and time. Moves all 4 extremities Skin: Skin is warm and dry.  Psychiatric: He has a normal mood and affect.    Assessment/Plan Paroxysmal atrial fibrillation with rapid ventricular response Frequent ventricular premature beats S/P fall S/P multiple rib fratures with hemothorax CAD COPD Gout Alcohol use disorder  Add diltiazem along with B-blocker use.  If needed will use amiodarone.  Not a candidate for anticoagulant due to frequent fall.  Naveena Eyman S 07/11/2013, 9:25 AM

## 2013-07-12 ENCOUNTER — Inpatient Hospital Stay (HOSPITAL_COMMUNITY): Payer: Medicare Other

## 2013-07-12 LAB — BASIC METABOLIC PANEL
Calcium: 9 mg/dL (ref 8.4–10.5)
GFR calc non Af Amer: >90 mL/min (ref >90)
Glucose, Bld: 90 mg/dL (ref 70–99)
Sodium: 140 mEq/L (ref 135–145)

## 2013-07-12 NOTE — Progress Notes (Signed)
  Subjective: Complains of left sided pain with deep breaths  Objective: Vital signs in last 24 hours: Temp:  [98.1 F (36.7 C)-98.9 F (37.2 C)] 98.1 F (36.7 C) (08/09 0800) Pulse Rate:  [71-84] 84 (08/09 0800) Resp:  [26-38] 38 (08/09 0346) BP: (95-129)/(56-94) 126/76 mmHg (08/09 0800) SpO2:  [93 %-98 %] 93 % (08/09 0346) Last BM Date: 07/04/13  Intake/Output from previous day: 08/08 0701 - 08/09 0700 In: 1330 [P.O.:480; IV Piggyback:850] Out: 1105 [Urine:1050; Chest Tube:55] Intake/Output this shift: Total I/O In: 60 [P.O.:60] Out: 100 [Urine:100]  General appearance: no distress Resp: occ wheezes Cardio: regular rate and rhythm GI: soft nontender  Lab Results:   Recent Labs  07/10/13 0500 07/11/13 0635  WBC 11.4* 9.4  HGB 11.2* 12.3*  HCT 34.2* 38.4*  PLT 385 361   BMET  Recent Labs  07/10/13 0500 07/12/13 0530  NA 136 140  K 3.9 3.5  CL 101 105  CO2 31 30  GLUCOSE 111* 90  BUN 9 7  CREATININE 0.79 0.75  CALCIUM 8.8 9.0   PT/INR No results found for this basename: LABPROT, INR,  in the last 72 hours ABG No results found for this basename: PHART, PCO2, PO2, HCO3,  in the last 72 hours  Studies/Results: Dg Chest Port 1 View  07/12/2013   *RADIOLOGY REPORT*  Clinical Data: Chest tube follow-up  PORTABLE CHEST - 1 VIEW  Comparison: 07/11/2013  Findings: Left chest tube remains in place.  Left rib fractures and left pleural effusion are unchanged.  Left lower lobe atelectasis also unchanged.  Negative for pneumothorax.  Right lung is clear.  IMPRESSION: Left lower lobe atelectasis and effusion unchanged.  No pneumothorax.   Original Report Authenticated By: Janeece Riggers, M.D.   Dg Chest Port 1 View  07/11/2013   *RADIOLOGY REPORT*  Clinical Data: Left-sided chest tube.  PORTABLE CHEST - 1 VIEW  Comparison: 07/10/2013.  Findings: Left thoracostomy tube remains present.  There is no pneumothorax identified.  Left rib fractures better appreciated on prior  exams. Monitoring leads are projected over the chest. Cardiopericardial silhouette appears within normal limits. Tortuous thoracic aorta.  The hazy opacity at the left lung base remains present, compatible with loculated pleural fluid.  No soft tissue emphysema.  IMPRESSION: No interval change.  Stable position of the left thoracostomy tube. No pneumothorax.   Original Report Authenticated By: Andreas Newport, M.D.    Anti-infectives: Anti-infectives   Start     Dose/Rate Route Frequency Ordered Stop   07/09/13 1230  vancomycin (VANCOCIN) IVPB 750 mg/150 ml premix     750 mg 150 mL/hr over 60 Minutes Intravenous Every 12 hours 07/09/13 1128     07/09/13 1200  ceFEPIme (MAXIPIME) 1 g in dextrose 5 % 50 mL IVPB     1 g 100 mL/hr over 30 Minutes Intravenous 3 times per day 07/09/13 1128        Assessment/Plan: Fall with hemothorax Mult left rib fxs w/delayed HTX s/p CT x2 -- Pulmonary toilet, ct to come out today per thoracic surgery, oob ABL anemia -- Stable  SVT -- appreciate cardiology consult and following, did well overnight ID --  empiric vanc/cefepime d5, WBC is normal now on abx so will plan 7d therapy  FEN -- Oral intake good, ate most of breakfast VTE -- SCD's, Lovenox being held secondary to recurrent HTX  Dispo -- Planning for SNF placement once stable. Will remain stepdown for now   [facility] 07/12/2013

## 2013-07-12 NOTE — Progress Notes (Addendum)
       [address]       [place] [address]             [phone]               Subjective: Comfortable, breathing "rough".    Objective: Vital signs in last 24 hours: Patient Vitals for the past 24 hrs:  BP Temp Temp src Pulse Resp SpO2  07/12/13 0800 126/76 mmHg 98.1 F (36.7 C) Oral 84 - -  07/12/13 0631 108/72 mmHg - - - - -  07/12/13 0346 - - - - 38 93 %  07/12/13 0320 - 98.3 F (36.8 C) Oral - - -  07/12/13 0300 109/70 mmHg - - - 29 98 %  07/11/13 2318 109/62 mmHg - - - 26 94 %  07/11/13 2310 - 98.9 F (37.2 C) Oral - - -  07/11/13 1945 - 98.1 F (36.7 C) - - - -  07/11/13 1936 95/56 mmHg - - - 27 96 %  07/11/13 1902 - - - - 31 96 %  07/11/13 1800 129/72 mmHg - - - - -  07/11/13 1600 129/72 mmHg 98.6 F (37 C) Oral 80 - 96 %  07/11/13 1550 129/72 mmHg - - 82 - -  07/11/13 1204 116/94 mmHg 98.2 F (36.8 C) Oral 71 28 97 %  07/11/13 1202 116/94 mmHg - - - - -   Current Weight  06/30/13 142 lb 3.2 oz (64.5 kg)     Intake/Output from previous day: 08/08 0701 - 08/09 0700 In: 1330 [P.O.:480; IV Piggyback:850] Out: 1105 [Urine:1050; Chest Tube:55]    PHYSICAL EXAM:  Heart: RRR, slightly tachy Lungs: Coarse rhonchi bilaterally Chest tube: No air leak    Lab Results: CBC: Recent Labs  07/10/13 0500 07/11/13 0635  WBC 11.4* 9.4  HGB 11.2* 12.3*  HCT 34.2* 38.4*  PLT 385 361   BMET:  Recent Labs  07/10/13 0500 07/12/13 0530  NA 136 140  K 3.9 3.5  CL 101 105  CO2 31 30  GLUCOSE 111* 90  BUN 9 7  CREATININE 0.79 0.75  CALCIUM 8.8 9.0    PT/INR: No results found for this basename: LABPROT, INR,  in the last 72 hours   CXR: Findings: Left chest tube remains in place. Left rib fractures and  left pleural effusion are unchanged. Left lower lobe atelectasis  also unchanged.  Negative for pneumothorax. Right lung is clear.  IMPRESSION:  Left lower lobe atelectasis and effusion unchanged. No   pneumothorax.   Assessment/Plan: S/P  Left chest tube- No air leak, CXR stable.  Minimal drainage.  Will d/c CT today. Other care per trauma service.    LOS: 12 days    COLLINS,GINA H 07/12/2013   Will d/c chest tube today, Please call for further needs I have seen and examined Zachary [name] and agree with the above assessment  and plan.  Delight Ovens MD Beeper [phone] Office [phone] 07/12/2013 10:23 AM

## 2013-07-13 ENCOUNTER — Inpatient Hospital Stay (HOSPITAL_COMMUNITY): Payer: Medicare Other

## 2013-07-13 LAB — CULTURE, RESPIRATORY W GRAM STAIN

## 2013-07-13 MED ORDER — VANCOMYCIN HCL IN DEXTROSE 1-5 GM/200ML-% IV SOLN
1000.0000 mg | Freq: Two times a day (BID) | INTRAVENOUS | Status: DC
  Administered 2013-07-14: 1000 mg via INTRAVENOUS
  Filled 2013-07-13 (×3): qty 200

## 2013-07-13 NOTE — Progress Notes (Signed)
Patient ID: Zachary [name], male   DOB: 1941/06/07, 72 y.o.   MRN: [id number]    Subjective: Up in chair, feeling better, very impulsive per nursing  Objective: Vital signs in last 24 hours: Temp:  [97.4 F (36.3 C)-98.5 F (36.9 C)] 98.4 F (36.9 C) (08/10 0800) Pulse Rate:  [60-79] 79 (08/10 0943) Resp:  [20-34] 30 (08/10 0730) BP: (114-140)/(66-91) 121/75 mmHg (08/10 0943) SpO2:  [92 %-99 %] 92 % (08/10 0800) Last BM Date: 07/12/13  Intake/Output from previous day: 08/09 0701 - 08/10 0700 In: 420 [P.O.:420] Out: 1175 [Urine:1175] Intake/Output this shift: Total I/O In: -  Out: 300 [Urine:300]  General appearance: alert and cooperative Resp: clear after cough Chest wall: left sided chest wall tenderness Cardio: irregularly irregular rhythm GI: soft, NT, ND Neuro: F/C  Lab Results: CBC   Recent Labs  07/11/13 0635  WBC 9.4  HGB 12.3*  HCT 38.4*  PLT 361   BMET  Recent Labs  07/12/13 0530  NA 140  K 3.5  CL 105  CO2 30  GLUCOSE 90  BUN 7  CREATININE 0.75  CALCIUM 9.0   PT/INR No results found for this basename: LABPROT, INR,  in the last 72 hours ABG No results found for this basename: PHART, PCO2, PO2, HCO3,  in the last 72 hours  Studies/Results: Dg Chest Port 1 View  07/13/2013   *RADIOLOGY REPORT*  Clinical Data: 72 year old male - multiple rib fractures - chest tube removal  PORTABLE CHEST - 1 VIEW  Comparison: 07/12/2013 and multiple prior radiographs  Findings: Cardiomediastinal silhouette and left lower lung opacity are unchanged. There is no evidence of pneumothorax. Multiple left-sided rib fractures are again noted. The right lung is clear. There has been no interval change since the prior study.  IMPRESSION: Stable chest radiograph - no evidence of pneumothorax.   Original Report Authenticated By: Harmon Pier, M.D.   Dg Chest Port 1 View  07/12/2013   *RADIOLOGY REPORT*  Clinical Data: Chest tube follow-up  PORTABLE CHEST - 1 VIEW   Comparison: 07/11/2013  Findings: Left chest tube remains in place.  Left rib fractures and left pleural effusion are unchanged.  Left lower lobe atelectasis also unchanged.  Negative for pneumothorax.  Right lung is clear.  IMPRESSION: Left lower lobe atelectasis and effusion unchanged.  No pneumothorax.   Original Report Authenticated By: Janeece Riggers, M.D.   Dg Chest Port 1v Same Day  07/12/2013   *RADIOLOGY REPORT*  Clinical Data: Hemothorax, chest tube removal.  PORTABLE CHEST - 1 VIEW SAME DAY  Comparison: Earlier film of the same day  Findings: Interval removal of left chest tube.  No pneumothorax. Persistent   opacity at the left lung base as before, possibly residual loculated effusion.  Right lung clear.  Heart size normal. Left fifth, sixth, seventh, eighth, and ninth rib fractures noted.  IMPRESSION:  1.  Left chest tube removal with no pneumothorax. 2.  Persistent opacity at the left lung base. 3.  Multiple left rib fractures as previously noted.   Original Report Authenticated By: D. Andria Rhein, MD    Anti-infectives: Anti-infectives   Start     Dose/Rate Route Frequency Ordered Stop   07/09/13 1230  vancomycin (VANCOCIN) IVPB 750 mg/150 ml premix     750 mg 150 mL/hr over 60 Minutes Intravenous Every 12 hours 07/09/13 1128     07/09/13 1200  ceFEPIme (MAXIPIME) 1 g in dextrose 5 % 50 mL IVPB     1 g 100  mL/hr over 30 Minutes Intravenous 3 times per day 07/09/13 1128        Assessment/Plan: Fall with hemothorax Mult left rib fxs w/delayed HTX s/p CT x2 -- Pulmonary toilet, CT out ABL anemia -- Stable  SVT -- appreciate cardiology consult and following ID --  empiric vanc/cefepime completes 7D therapy today  FEN -- Oral intake good, ate most of breakfast VTE -- SCD's Dispo -- Planning for SNF placement, SDU for fall risk today   LOS: 13 days    Violeta Gelinas, MD, MPH, FACS Pager: [phone]  07/13/2013

## 2013-07-13 NOTE — Progress Notes (Signed)
       [address]       [name] [address]             [phone]               Subjective: Comfortable, up in chair , more alert and talkative    Objective: Vital signs in last 24 hours: Patient Vitals for the past 24 hrs:  BP Temp Temp src Pulse Resp SpO2  07/13/13 0943 121/75 mmHg - - 79 - -  07/13/13 0800 121/75 mmHg 98.4 F (36.9 C) Axillary 74 - 92 %  07/13/13 0730 - - - - 30 -  07/13/13 0720 - - - - 26 -  07/13/13 0710 - - - - 27 -  07/13/13 0700 - - - - 29 -  07/13/13 0650 - - - - 23 -  07/13/13 0640 - - - - 34 -  07/13/13 0630 - - - - 24 -  07/13/13 0505 140/66 mmHg 98.5 F (36.9 C) Oral 75 20 99 %  07/13/13 0020 114/91 mmHg 97.6 F (36.4 C) Oral 60 27 98 %  07/12/13 2000 138/74 mmHg 97.4 F (36.3 C) Axillary 61 - -  07/12/13 1943 138/74 mmHg 97.4 F (36.3 C) Axillary 61 20 97 %  07/12/13 1752 132/72 mmHg - - - - -  07/12/13 1600 132/72 mmHg 98.3 F (36.8 C) Oral 78 - -  07/12/13 1548 132/72 mmHg - - 74 - -  07/12/13 1200 115/73 mmHg 97.5 F (36.4 C) Oral 61 - -  07/12/13 1100 - 97.5 F (36.4 C) Oral - - -   Current Weight  06/30/13 142 lb 3.2 oz (64.5 kg)     Intake/Output from previous day: 08/09 0701 - 08/10 0700 In: 420 [P.O.:420] Out: 1175 [Urine:1175]    PHYSICAL EXAM:  Heart: RRR, slightly tachy Lungs: Coarse rhonchi bilaterally Chest tube: No air leak    Lab Results: CBC:  Recent Labs  07/11/13 0635  WBC 9.4  HGB 12.3*  HCT 38.4*  PLT 361   BMET:   Recent Labs  07/12/13 0530  NA 140  K 3.5  CL 105  CO2 30  GLUCOSE 90  BUN 7  CREATININE 0.75  CALCIUM 9.0    PT/INR: No results found for this basename: LABPROT, INR,  in the last 72 hours  Dg Chest Port 1 View  07/13/2013   *RADIOLOGY REPORT*  Clinical Data: 72 year old male - multiple rib fractures - chest tube removal  PORTABLE CHEST - 1 VIEW  Comparison: 07/12/2013 and multiple prior radiographs  Findings: Cardiomediastinal silhouette  and left lower lung opacity are unchanged. There is no evidence of pneumothorax. Multiple left-sided rib fractures are again noted. The right lung is clear. There has been no interval change since the prior study.  IMPRESSION: Stable chest radiograph - no evidence of pneumothorax.   Original Report Authenticated By: Harmon Pier, M.D.    Assessment/Plan: S/P  Left chest tube- Stable chest xray today, call for further help   LOS: 13 days    Jazier Mcglamery B 07/13/2013

## 2013-07-13 NOTE — Progress Notes (Signed)
ANTIBIOTIC CONSULT NOTE - FOLLOW UP  Pharmacy Consult for Vancomycin/Cefepime Indication: HCAP  No Known Allergies  Patient Measurements: Height: 5\' 6"  (167.6 cm) Weight: 142 lb 3.2 oz (64.5 kg) IBW/kg (Calculated) : 63.8  Vital Signs: Temp: 97.6 F (36.4 C) (08/10 1202) Temp src: Axillary (08/10 1202) BP: 106/62 mmHg (08/10 1202) Pulse Rate: 59 (08/10 1202) Intake/Output from previous day: 08/09 0701 - 08/10 0700 In: 420 [P.O.:420] Out: 1175 [Urine:1175] Intake/Output from this shift: Total I/O In: 120 [P.O.:120] Out: 550 [Urine:550]  Labs:  Recent Labs  07/11/13 0635 07/12/13 0530  WBC 9.4  --   HGB 12.3*  --   PLT 361  --   CREATININE  --  0.75   Estimated Creatinine Clearance: 77.5 ml/min (by C-G formula based on Cr of 0.75).  Recent Labs  07/13/13 1120  VANCOTROUGH 14.5     Microbiology: Recent Results (from the past 720 hour(s))  MRSA PCR SCREENING     Status: None   Collection Time    06/22/13  2:37 PM      Result Value Range Status   MRSA by PCR NEGATIVE  NEGATIVE Final   Comment:            The GeneXpert MRSA Assay (FDA     approved for NASAL specimens     only), is one component of a     comprehensive MRSA colonization     surveillance program. It is not     intended to diagnose MRSA     infection nor to guide or     monitor treatment for     MRSA infections.  CULTURE, RESPIRATORY (NON-EXPECTORATED)     Status: None   Collection Time    07/09/13  7:50 PM      Result Value Range Status   Specimen Description TRACHEAL ASPIRATE   Final   Special Requests Normal   Final   Gram Stain     Final   Value: FEW WBC PRESENT, PREDOMINANTLY PMN     RARE SQUAMOUS EPITHELIAL CELLS PRESENT     FEW YEAST     Performed at [facility]   Culture     Final   Value: MODERATE STAPHYLOCOCCUS AUREUS     Note: RIFAMPIN AND GENTAMICIN SHOULD NOT BE USED AS SINGLE DRUGS FOR TREATMENT OF STAPH INFECTIONS.     Performed at [facility]   Report Status PENDING   Incomplete    Anti-infectives   Start     Dose/Rate Route Frequency Ordered Stop   07/14/13 0100  vancomycin (VANCOCIN) IVPB 1000 mg/200 mL premix     1,000 mg 200 mL/hr over 60 Minutes Intravenous Every 12 hours 07/13/13 1322 07/17/13 0059   07/09/13 1230  vancomycin (VANCOCIN) IVPB 750 mg/150 ml premix     750 mg 150 mL/hr over 60 Minutes Intravenous Every 12 hours 07/09/13 1128 07/14/13 0029   07/09/13 1200  ceFEPIme (MAXIPIME) 1 g in dextrose 5 % 50 mL IVPB     1 g 100 mL/hr over 30 Minutes Intravenous 3 times per day 07/09/13 1128 07/17/13 1359      Assessment: 72 y/o M on D #5/8 therapy with vancomycin/cefepime for HCAP. Tmax 98.5, renal function stable with CrCl ~ 75-80. WBC 9.4<11.4. Vancomycin trough today is 14.5 (this was drawn about 2 hours early, true vancomycin trough would have been around 12.5-13, so will increase dose given culture with staph aureus).   8/6 Vanco>> 8/6 Cefepime>>  8/6 RespCx>>Staph aureus (  awaiting sensi)  Goal of Therapy:  Vancomycin trough level 15-20 mcg/ml  Plan:  -Increase Vancomycin to 1000 mg IV q12h -Continue cefepime 1g IV q8h -8 day stop date in place for antibiotics -F/U sensi of staph for possible de-escalation to one of these agents -Trend WBC, temp, renal function  Thank you for allowing me to take part in this patient's care,  Abran Duke, PharmD Clinical Pharmacist Phone: [phone] Pager: [phone] 07/13/2013 1:32 PM

## 2013-07-14 DIAGNOSIS — J9819 Other pulmonary collapse: Secondary | ICD-10-CM

## 2013-07-14 DIAGNOSIS — E46 Unspecified protein-calorie malnutrition: Secondary | ICD-10-CM

## 2013-07-14 MED ORDER — OXYCODONE HCL 5 MG PO TABS: 10.0000 mg | ORAL_TABLET | ORAL | Status: DC | PRN

## 2013-07-14 MED ORDER — SULFAMETHOXAZOLE-TMP DS 800-160 MG PO TABS: ORAL_TABLET | ORAL | Status: DC

## 2013-07-14 MED ORDER — DILTIAZEM HCL ER 120 MG PO CP24
120.0000 mg | ORAL_CAPSULE | Freq: Every day | ORAL | Status: DC
  Administered 2013-07-14: 120 mg via ORAL
  Filled 2013-07-14: qty 1

## 2013-07-14 MED ORDER — DILTIAZEM HCL ER 120 MG PO CP24: 120.0000 mg | ORAL_CAPSULE | Freq: Every day | ORAL | Status: DC

## 2013-07-14 MED ORDER — SULFAMETHOXAZOLE-TMP DS 800-160 MG PO TABS
1.0000 | ORAL_TABLET | Freq: Two times a day (BID) | ORAL | Status: DC
  Administered 2013-07-14: 1 via ORAL
  Filled 2013-07-14 (×2): qty 1

## 2013-07-14 MED ORDER — DRONABINOL 2.5 MG PO CAPS: 2.5000 mg | ORAL_CAPSULE | Freq: Two times a day (BID) | ORAL | Status: DC

## 2013-07-14 NOTE — Consult Note (Signed)
Subjective:  Left lung opacity continues. + Cough. Decrease tachy arrhtyhmia with diltiazem use.  Objective:  Vital Signs in the last 24 hours: Temp:  [97.6 F (36.4 C)-98.6 F (37 C)] 98.2 F (36.8 C) (08/11 0738) Pulse Rate:  [59-82] 70 (08/11 0332) Cardiac Rhythm:  [-] Normal sinus rhythm (08/11 0748) Resp:  [21-37] 32 (08/11 0332) BP: (106-146)/(57-77) 146/77 mmHg (08/11 0738) SpO2:  [93 %-96 %] 96 % (08/11 0738)  Physical Exam: BP Readings from Last 1 Encounters:  07/14/13 146/77     Wt Readings from Last 1 Encounters:  06/30/13 64.5 kg (142 lb 3.2 oz)    Weight change:   HEENT: [place]/AT, Eyes-Brown, PERL, EOMI, Conjunctiva-Pink, Sclera-Non-icteric Neck: No JVD, No bruit, Trachea midline. Lungs:  Clear, Bilateral. Cardiac:  Regular rhythm, normal S1 and S2, no S3.  Abdomen:  Soft, non-tender. Extremities:  No edema present. No cyanosis. No clubbing. CNS: AxOx3, Cranial nerves grossly intact, moves all 4 extremities. Right handed. Skin: Warm and dry.   Intake/Output from previous day: 08/10 0701 - 08/11 0700 In: 660 [P.O.:360; IV Piggyback:300] Out: 1650 [Urine:1650]    Lab Results: BMET    Component Value Date/Time   NA 140 07/12/2013 0530   K 3.5 07/12/2013 0530   CL 105 07/12/2013 0530   CO2 30 07/12/2013 0530   GLUCOSE 90 07/12/2013 0530   BUN 7 07/12/2013 0530   CREATININE 0.75 07/12/2013 0530   CALCIUM 9.0 07/12/2013 0530   GFRNONAA >90 07/12/2013 0530   GFRAA >90 07/12/2013 0530   CBC    Component Value Date/Time   WBC 9.4 07/11/2013 0635   RBC 3.83* 07/11/2013 0635   RBC 3.49* 01/04/2010 2355   HGB 12.3* 07/11/2013 0635   HCT 38.4* 07/11/2013 0635   PLT 361 07/11/2013 0635   MCV 100.3* 07/11/2013 0635   MCH 32.1 07/11/2013 0635   MCHC 32.0 07/11/2013 0635   RDW 13.8 07/11/2013 0635   LYMPHSABS 1.3 06/30/2013 1625   MONOABS 1.2* 06/30/2013 1625   EOSABS 0.0 06/30/2013 1625   BASOSABS 0.0 06/30/2013 1625   CARDIAC ENZYMES Lab Results  Component Value Date   CKTOTAL 26 05/02/2011    CKMB 0.9 05/02/2011   TROPONINI <0.30 06/30/2013    Scheduled Meds: . ceFEPime (MAXIPIME) IV  1 g Intravenous Q8H  . diazepam  5 mg Oral TID  . diltiazem  120 mg Oral Daily  . docusate sodium  200 mg Oral BID  . dronabinol  2.5 mg Oral BID AC  . folic acid  1 mg Oral Daily  . gabapentin  100 mg Oral TID  . metoprolol tartrate  25 mg Oral TID  . multivitamin with minerals  1 tablet Oral Daily  . polyethylene glycol  17 g Oral Daily  . thiamine  100 mg Oral Daily   Continuous Infusions: . dextrose 5 % and 0.45% NaCl 1,000 mL infusion 75 mL/hr at 07/11/13 0800   PRN Meds:.albuterol, alum & mag hydroxide-simeth, bisacodyl, lactulose, LORazepam, ondansetron (ZOFRAN) IV, ondansetron, oxyCODONE, traMADol  Assessment/Plan: Paroxysmal atrial fibrillation with rapid ventricular response  Frequent ventricular premature beats  S/P fall  S/P multiple rib fratures with hemothorax  CAD  COPD  Gout  Alcohol use disorder  Change diltiazem to long acting daily dose.   LOS: 14 days    Orpah Cobb  MD  07/14/2013, 9:56 AM

## 2013-07-14 NOTE — Discharge Summary (Signed)
Physician Discharge Summary  Patient ID: Zachary [name] MRN: [id number] DOB/AGE: 70-Nov-1942 70 y.o.  Admit date: 06/30/2013 Discharge date: 07/14/2013  Discharge Diagnoses Patient Active Problem List   Diagnosis Date Noted  . Severe protein-calorie malnutrition 07/09/2013  . Pneumonia 07/09/2013  . Traumatic hemothorax 07/01/2013  . Acute blood loss anemia 07/01/2013  . Fall 06/23/2013  . Multiple fractures of ribs of left side 06/23/2013  . Alcohol dependence with acute alcoholic intoxication 02/26/2012    Class: Acute  . Alcohol dependence 10/17/2011  . HEPATITIS B 12/01/2008  . HYPERTENSION 12/01/2008  . COPD UNSPECIFIED 12/01/2008  . HEPATIC CIRRHOSIS, ALCOHOLIC 12/01/2008  . ARTHRITIS 12/01/2008  . HERNIA, HX OF 12/01/2008    Consultants Dr. Nelly Rout for psychiatry  Dr. Sheliah Plane for thoracic surgery  Dr. Orpah Cobb for cardiology   Procedures Left tube thoracostomy by Dr. Gaynelle Adu  Left tube thoracostomy by Dr. Sheliah Plane   HPI: Zachary [name] was recently on the trauma service from 7/20 - 7/26 after sustaining a ground level fall resulting in multiple left rib fractures. His pain and alcohol withdrawal were treated and he was able to be discharged home. He was brought back to ED because of worsening shortness of breath and difficulty breathing. A CXR and chest CT demonstrated a large left hemothorax. A chest tube was placed and he was admitted.   Hospital Course: The patient was started back on his previous alcohol withdrawal regimen but continued to have problems throughout his hospitalization with confusion and agitation. Changes in medication did little to relieve this. His chest tube was able to be put to waterseal and then eventually removed without difficulty. He continued to have problems with confusion and psychiatry was asked to consult. They determined he lacked capacity to make decisions in his own best interest. During this time he had  recurrent episodes of tachypnea and hypoxia. They slowly but steadily worsened and another chest CT was obtained. This showed a large recurrent left hemothorax. Thoracic surgery was consulted and did not think a VATS was indicated at this time and placed another chest tube. This drained a large amount of old blood and was able to be removed after several days. During this time he developed atrial fibrillation with rapid ventricular response. Cardiology was consulted and placed the patient on Cardizem which worked well. He was converted to an oral long-acting form with good success. Eventually we were able to wean his alcohol and control his agitation though he continued to struggle with encephalopathy. Physical and occupational therapy at work with the patient continuously and recommended skilled nursing facility placement. He was transferred there in improved condition.      Medication List    STOP taking these medications       LORazepam 1 MG tablet  Commonly known as:  ATIVAN      TAKE these medications       albuterol 108 (90 BASE) MCG/ACT inhaler  Commonly known as:  PROVENTIL HFA;VENTOLIN HFA  Inhale 2 puffs into the lungs every 6 (six) hours as needed for wheezing.     diltiazem 120 MG 24 hr capsule  Commonly known as:  DILACOR XR  Take 1 capsule (120 mg total) by mouth daily.     dronabinol 2.5 MG capsule  Commonly known as:  MARINOL  Take 1 capsule (2.5 mg total) by mouth 2 (two) times daily before lunch and supper.     gabapentin 100 MG capsule  Commonly known as:  NEURONTIN  Take  100 mg by mouth 3 (three) times daily.     metoprolol tartrate 25 MG tablet  Commonly known as:  LOPRESSOR  Take 25 mg by mouth 2 (two) times daily.     naproxen 500 MG tablet  Commonly known as:  NAPROSYN  Take 500 mg by mouth 2 (two) times daily with a meal.     sulfamethoxazole-trimethoprim 800-160 MG per tablet  Commonly known as:  BACTRIM DS  1 tab po bid x4d     traMADol 50 MG  tablet  Commonly known as:  ULTRAM  Take 50-100 mg by mouth every 6 (six) hours as needed for pain.     traZODone 50 MG tablet  Commonly known as:  DESYREL  Take 50-100 mg by mouth at bedtime as needed for sleep. *takes an extra 50mg  if need help sleeping             Follow-up Information   Schedule an appointment as soon as possible for a visit with Ricki Rodriguez, MD.   Contact information:   [address] [place] [address] [phone]       Call [facility]. (As needed)    Contact information:   [address] [place] [address] [phone]       Discharge planning took greater than 30 minutes.    Signed: Freeman Caldron, PA-C Pager: [phone] General Trauma PA Pager: [phone]  07/14/2013, 12:51 PM

## 2013-07-14 NOTE — Progress Notes (Signed)
Trauma Service Note  Subjective: Patient is asking for pain medications.  No CXR today.  Has a continued contusion on the left side  Objective: Vital signs in last 24 hours: Temp:  [97.6 F (36.4 C)-98.6 F (37 C)] 98.2 F (36.8 C) (08/11 0738) Pulse Rate:  [59-82] 70 (08/11 0332) Resp:  [21-37] 32 (08/11 0332) BP: (106-146)/(57-77) 146/77 mmHg (08/11 0738) SpO2:  [93 %-96 %] 96 % (08/11 0738) Last BM Date: 07/13/13 (per report received from dayshift RN)  Intake/Output from previous day: 08/10 0701 - 08/11 0700 In: 660 [P.O.:360; IV Piggyback:300] Out: 1650 [Urine:1650] Intake/Output this shift: Total I/O In: -  Out: 300 [Urine:300]  General: Still very weak.  No distress although he is having some chest wall pain.  Lungs: Wheezing in the right side.  Diminished on the left.  Oxygen saturations are good.  Abd: Soft, benign, but patient does not want to eat.  Extremities: No changes  Neuro: Still oriented to person and place.  Lab Results: CBC  No results found for this basename: WBC, HGB, HCT, PLT,  in the last 72 hours BMET  Recent Labs  07/12/13 0530  NA 140  K 3.5  CL 105  CO2 30  GLUCOSE 90  BUN 7  CREATININE 0.75  CALCIUM 9.0   PT/INR No results found for this basename: LABPROT, INR,  in the last 72 hours ABG No results found for this basename: PHART, PCO2, PO2, HCO3,  in the last 72 hours  Studies/Results: Dg Chest Port 1 View  07/13/2013   *RADIOLOGY REPORT*  Clinical Data: 72 year old male - multiple rib fractures - chest tube removal  PORTABLE CHEST - 1 VIEW  Comparison: 07/12/2013 and multiple prior radiographs  Findings: Cardiomediastinal silhouette and left lower lung opacity are unchanged. There is no evidence of pneumothorax. Multiple left-sided rib fractures are again noted. The right lung is clear. There has been no interval change since the prior study.  IMPRESSION: Stable chest radiograph - no evidence of pneumothorax.   Original Report  Authenticated By: Harmon Pier, M.D.   Dg Chest Port 1v Same Day  07/12/2013   *RADIOLOGY REPORT*  Clinical Data: Hemothorax, chest tube removal.  PORTABLE CHEST - 1 VIEW SAME DAY  Comparison: Earlier film of the same day  Findings: Interval removal of left chest tube.  No pneumothorax. Persistent   opacity at the left lung base as before, possibly residual loculated effusion.  Right lung clear.  Heart size normal. Left fifth, sixth, seventh, eighth, and ninth rib fractures noted.  IMPRESSION:  1.  Left chest tube removal with no pneumothorax. 2.  Persistent opacity at the left lung base. 3.  Multiple left rib fractures as previously noted.   Original Report Authenticated By: D. Andria Rhein, MD    Anti-infectives: Anti-infectives   Start     Dose/Rate Route Frequency Ordered Stop   07/14/13 0100  vancomycin (VANCOCIN) IVPB 1000 mg/200 mL premix     1,000 mg 200 mL/hr over 60 Minutes Intravenous Every 12 hours 07/13/13 1322 07/17/13 0059   07/09/13 1230  vancomycin (VANCOCIN) IVPB 750 mg/150 ml premix     750 mg 150 mL/hr over 60 Minutes Intravenous Every 12 hours 07/09/13 1128 07/13/13 1326   07/09/13 1200  ceFEPIme (MAXIPIME) 1 g in dextrose 5 % 50 mL IVPB     1 g 100 mL/hr over 30 Minutes Intravenous 3 times per day 07/09/13 1128 07/17/13 1359      Assessment/Plan: s/p  Try to  get moved to SNF ASAP.  May need to check CXr tomorrow if the patient is still here.  LOS: 14 days   Marta Lamas. Gae Bon, MD, FACS [phone] Trauma Surgeon 07/14/2013

## 2013-07-14 NOTE — Clinical Social Work Note (Signed)
Clinical Social Worker facilitated patient discharge including contacting patient family and facility to confirm patient discharge plans.  Clinical information faxed to facility and family agreeable with plan.  CSW arranged ambulance transport via [facility] to [facility].  RN to call report prior to discharge.  Clinical Social Worker will sign off for now as social work intervention is no longer needed. Please consult us again if new need arises.  Jesse Maddeline Roorda, LCSW [phone] 

## 2013-07-14 NOTE — Clinical Documentation Improvement (Addendum)
THIS DOCUMENT IS NOT A PERMANENT PART OF THE MEDICAL RECORD  Please update your documentation within the medical record to reflect your response to this query. If you need help knowing how to do this please call [phone].  07/14/13  To Dr. Jimmye Norman, MD Zachary [name]  In a better effort to capture your patient's severity of illness, reflect appropriate length of stay and utilization of resources, a review of the medical record has revealed the following indicators.    Based on your clinical judgment, please clarify and document in a progress note and/or discharge summary the clinical condition associated with the following supporting information:  In responding to this query please exercise your independent judgment.  The fact that a query is asked, does not imply that any particular answer is desired or expected.  Abnormal findings (laboratory, x-ray, pathologic, and other diagnostic results) are not coded and reported unless the physician indicates their clinical significance.   The medical record reflects the following clinical findings, please clarify the diagnostic and/or clinical significance:      Chest xray: 07/08/13 "IMPRESSION: Stable left pleural effusion/hemarthrosis and dense left lower lobe  atelectasis and/or pneumonia"    with IV Vancomycin and  IV Cefepime started on 07/09/2013, respiratory cx : MODERATE STAPHYLOCOCCUS AUREUS"...if appropriate please document what infection was being treated with the antibiotics.  Thank you   Possible Clinical Conditions?                                Pneumonia  Atelectasis  Bacterial Plural effusion  Other Condition              Cannot Clinically Determine  Treatment: IV vancomycin  and IV Cefepime  Reviewed:  no additional documentation provided I was not here on 8/6   Thank You,  Leonette Most Addison  Clinical Documentation Specialist: [phone] Health Information Management [place]

## 2013-07-14 NOTE — Progress Notes (Signed)
Pt discharged to [facility]. VSS. PIV removed, report called to [facility], [place] requested condom cath to stay on. All meds current. Bath given before discharge.

## 2013-07-16 ENCOUNTER — Non-Acute Institutional Stay (SKILLED_NURSING_FACILITY): Payer: Medicare Other | Admitting: Internal Medicine

## 2013-07-16 DIAGNOSIS — D62 Acute posthemorrhagic anemia: Secondary | ICD-10-CM

## 2013-07-16 DIAGNOSIS — S279XXS Injury of unspecified intrathoracic organ, sequela: Secondary | ICD-10-CM

## 2013-07-16 DIAGNOSIS — S271XXS Traumatic hemothorax, sequela: Secondary | ICD-10-CM

## 2013-07-16 DIAGNOSIS — I10 Essential (primary) hypertension: Secondary | ICD-10-CM

## 2013-07-16 DIAGNOSIS — I4891 Unspecified atrial fibrillation: Secondary | ICD-10-CM

## 2013-08-11 ENCOUNTER — Telehealth (HOSPITAL_COMMUNITY): Payer: Self-pay | Admitting: Emergency Medicine

## 2013-08-11 NOTE — Telephone Encounter (Signed)
Spoke with Bridgett, sutures may be removed from old CT site

## 2013-08-12 DIAGNOSIS — I4891 Unspecified atrial fibrillation: Secondary | ICD-10-CM | POA: Insufficient documentation

## 2013-08-12 NOTE — Progress Notes (Signed)
Patient ID: Zachary [name], male   DOB: 07/28/1941, 72 y.o.   MRN: [id number]        HISTORY & PHYSICAL  DATE: 07/16/2013   FACILITY: [facility]  LEVEL OF CARE: SNF (31)  ALLERGIES:  No Known Allergies  CHIEF COMPLAINT:  Manage atrial fibrillation, acute blood loss anemia, and hypertension.    HISTORY OF PRESENT ILLNESS:  The patient is a 72 year-old, African-American male who was hospitalized for a large hemothorax.  After hospitalization, he is admitted to this facility for short-term rehabilitation.     ANEMIA: Due to hemothorax, patient suffered acute blood loss.  The anemia has been stable. The patient denies fatigue, melena or hematochezia. The patient is currently not on iron.   Last hemoglobin level:  9.8.    HTN: Pt 's HTN remains stable.  Denies CP, sob, DOE, pedal edema, headaches, dizziness or visual disturbances.  No complications from the medications currently being used.  Last BP :  140/72.     ATRIAL FIBRILLATION: the patients atrial fibrillation remains stable.  The patient denies DOE, tachycardia, orthopnea, transient neurological sx, pedal edema, palpitations, & PNDs.  No complications noted from the medications currently being used.    PAST MEDICAL HISTORY :  Past Medical History  Diagnosis Date  . Hypertension   . Coronary artery disease   . Hyperlipemia   . Angina   . Shortness of breath   . Mental disorder   . Arthritis   . COPD (chronic obstructive pulmonary disease)   . Hiatal hernia   . Depression   . Gout   . Joint pain   . Depression   . Anxiety     PAST SURGICAL HISTORY: Past Surgical History  Procedure Laterality Date  . Abdominal surgery      SOCIAL HISTORY:  reports that he has been smoking Cigarettes.  He has a 15 pack-year smoking history. He has quit using smokeless tobacco. He reports that he drinks about 7.2 ounces of alcohol per week. He reports that he does not use illicit drugs.  FAMILY HISTORY:  None  CURRENT MEDICATIONS: Reviewed per MAR  REVIEW OF SYSTEMS:  Unobtainable.  Patient is a poor historian.      PHYSICAL EXAMINATION  VS:  T 100.2      P 80      RR 16      BP 140/72      POX%        WT (Lb)  GENERAL: no acute distress, normal body habitus EYES: conjunctivae normal, sclerae normal, normal eye lids MOUTH/THROAT: lips without lesions,no lesions in the mouth,tongue is without lesions,uvula elevates in midline NECK: supple, trachea midline, no neck masses, no thyroid tenderness, no thyromegaly LYMPHATICS: no LAN in the neck, no supraclavicular LAN RESPIRATORY: breathing is even & unlabored, BS CTAB CARDIAC: RRR, no murmur,no extra heart sounds ARTERIAL:  +1 pedal pulses.   GI:  ABDOMEN: abdomen soft, normal BS, no masses, no tenderness  LIVER/SPLEEN: no hepatomegaly, no splenomegaly MUSCULOSKELETAL: HEAD: normal to inspection & palpation BACK: no kyphosis, scoliosis or spinal processes tenderness EXTREMITIES: LEFT UPPER EXTREMITY: strength decreased, range of motion moderate  RIGHT UPPER EXTREMITY: strength decreased, range of motion moderate  LEFT LOWER EXTREMITY: strength intact, range of motion unable to perform   RIGHT LOWER EXTREMITY: strength intact, range of motion unable to perform   PSYCHIATRIC: the patient is alert, unable to assess orientation, affect & mood appropriate  LABS/RADIOLOGY: WBC 11.2, hemoglobin 9.8, platelets  275.    CMP normal.    TSH 0.832.    Hemoglobin A1c 5.1.    INR 1.14.    Chest x-ray showed stable left pleural effusion/hemarthrosis.    CT of the chest showed large left hemothorax.    ASSESSMENT/PLAN:  Acute blood loss anemia.  Reassess hemoglobin level.    Hypertension.  Blood pressure borderline.  We will monitor.    Atrial fibrillation.  Rate controlled.    Traumatic hemothorax.  Status post drainage.    Severe protein calorie malnutrition.  Currently on Marinol.    Alcoholic cirrhosis.  Adequately  compensated.    COPD.  Well compensated.    Check CBC and BMP.    I have reviewed patient's medical records received at admission/from hospitalization.  CPT CODE: 08657

## 2013-08-25 ENCOUNTER — Non-Acute Institutional Stay (SKILLED_NURSING_FACILITY): Payer: Medicare Other | Admitting: Internal Medicine

## 2013-08-25 DIAGNOSIS — J449 Chronic obstructive pulmonary disease, unspecified: Secondary | ICD-10-CM

## 2013-08-25 DIAGNOSIS — D7589 Other specified diseases of blood and blood-forming organs: Secondary | ICD-10-CM | POA: Insufficient documentation

## 2013-08-25 DIAGNOSIS — I10 Essential (primary) hypertension: Secondary | ICD-10-CM

## 2013-08-25 DIAGNOSIS — I4891 Unspecified atrial fibrillation: Secondary | ICD-10-CM

## 2013-08-25 NOTE — Progress Notes (Signed)
PROGRESS NOTE  DATE: 08/25/2013  FACILITY: Nursing Home Location: [facility]  LEVEL OF CARE: SNF (31)  Routine Visit  CHIEF COMPLAINT:  Manage atrial fibrillation, COPD and hypertension  HISTORY OF PRESENT ILLNESS:  REASSESSMENT OF ONGOING PROBLEM(S):  ATRIAL FIBRILLATION: the patients atrial fibrillation remains stable.  The staff deny DOE, tachycardia, orthopnea, transient neurological sx, pedal edema, palpitations, & PNDs.  No complications noted from the medications currently being used.  COPD: the COPD remains stable.  Staff  deny sob, cough, wheezing or declining exercise tolerance.  No complications from the medications presently being used.  HTN: Pt 's HTN remains stable.  The staff Deny CP, sob, DOE, pedal edema, headaches, dizziness or visual disturbances.  No complications from the medications currently being used.  Last BP : 106/60  PAST MEDICAL HISTORY : Reviewed.  No changes.  CURRENT MEDICATIONS: Reviewed per [facility]  REVIEW OF SYSTEMS: Unobtainable, patient is a poor historian  PHYSICAL EXAMINATION  VS:  T 97.9      P 62     RR 18      BP 106/60     POX %     WT (Lb) 142  GENERAL: no acute distress, normal body habitus EYES: conjunctivae normal, sclerae normal, normal eye lids NECK: supple, trachea midline, no neck masses, no thyroid tenderness, no thyromegaly LYMPHATICS: no LAN in the neck, no supraclavicular LAN RESPIRATORY: breathing is even & unlabored, BS CTAB CARDIAC: Heart rate is irregularly irregular, no murmur,no extra heart sounds, no edema GI: abdomen soft, normal BS, no masses, no tenderness, no hepatomegaly, no splenomegaly PSYCHIATRIC: the patient is alert & disoriented, affect & behavior appropriate  LABS/RADIOLOGY:  8-14 WBC 9.6, hemoglobin 11.4, MCV 98, platelets 434, creatinine 2.07 otherwise BMP normal  ASSESSMENT/PLAN:  COPD-well compensated Atrial fibrillation-rate  controlled hypertension-well-controlled Macrocytosis-check vitamin B12 and RBC folate level CAD-stable GERD-stable Check liver profile  CPT CODE: 30865

## 2013-09-25 ENCOUNTER — Emergency Department (HOSPITAL_COMMUNITY): Payer: Medicare Other

## 2013-09-25 ENCOUNTER — Encounter (HOSPITAL_COMMUNITY): Payer: Self-pay | Admitting: Emergency Medicine

## 2013-09-25 ENCOUNTER — Inpatient Hospital Stay (HOSPITAL_COMMUNITY)
Admission: EM | Admit: 2013-09-25 | Discharge: 2013-10-06 | DRG: 329 | Disposition: A | Discharge status: Skilled Nursing Facility | Payer: Medicare Other | Attending: Surgery | Admitting: Surgery

## 2013-09-25 DIAGNOSIS — K929 Disease of digestive system, unspecified: Secondary | ICD-10-CM | POA: Diagnosis not present

## 2013-09-25 DIAGNOSIS — I471 Supraventricular tachycardia, unspecified: Secondary | ICD-10-CM | POA: Diagnosis not present

## 2013-09-25 DIAGNOSIS — I4949 Other premature depolarization: Secondary | ICD-10-CM | POA: Diagnosis present

## 2013-09-25 DIAGNOSIS — J449 Chronic obstructive pulmonary disease, unspecified: Secondary | ICD-10-CM | POA: Diagnosis present

## 2013-09-25 DIAGNOSIS — K567 Ileus, unspecified: Secondary | ICD-10-CM

## 2013-09-25 DIAGNOSIS — Y833 Surgical operation with formation of external stoma as the cause of abnormal reaction of the patient, or of later complication, without mention of misadventure at the time of the procedure: Secondary | ICD-10-CM | POA: Diagnosis not present

## 2013-09-25 DIAGNOSIS — I480 Paroxysmal atrial fibrillation: Secondary | ICD-10-CM

## 2013-09-25 DIAGNOSIS — F17200 Nicotine dependence, unspecified, uncomplicated: Secondary | ICD-10-CM | POA: Diagnosis present

## 2013-09-25 DIAGNOSIS — Y9210 Unspecified residential institution as the place of occurrence of the external cause: Secondary | ICD-10-CM | POA: Diagnosis present

## 2013-09-25 DIAGNOSIS — K560 Paralytic ileus: Secondary | ICD-10-CM | POA: Diagnosis not present

## 2013-09-25 DIAGNOSIS — F329 Major depressive disorder, single episode, unspecified: Secondary | ICD-10-CM | POA: Diagnosis present

## 2013-09-25 DIAGNOSIS — K660 Peritoneal adhesions (postprocedural) (postinfection): Secondary | ICD-10-CM | POA: Diagnosis present

## 2013-09-25 DIAGNOSIS — D62 Acute posthemorrhagic anemia: Secondary | ICD-10-CM | POA: Diagnosis present

## 2013-09-25 DIAGNOSIS — K631 Perforation of intestine (nontraumatic): Secondary | ICD-10-CM | POA: Diagnosis present

## 2013-09-25 DIAGNOSIS — F3289 Other specified depressive episodes: Secondary | ICD-10-CM | POA: Diagnosis present

## 2013-09-25 DIAGNOSIS — I2510 Atherosclerotic heart disease of native coronary artery without angina pectoris: Secondary | ICD-10-CM | POA: Diagnosis present

## 2013-09-25 DIAGNOSIS — T183XXA Foreign body in small intestine, initial encounter: Principal | ICD-10-CM | POA: Diagnosis present

## 2013-09-25 DIAGNOSIS — IMO0002 Reserved for concepts with insufficient information to code with codable children: Secondary | ICD-10-CM | POA: Diagnosis present

## 2013-09-25 DIAGNOSIS — F411 Generalized anxiety disorder: Secondary | ICD-10-CM | POA: Diagnosis present

## 2013-09-25 DIAGNOSIS — K659 Peritonitis, unspecified: Secondary | ICD-10-CM | POA: Diagnosis present

## 2013-09-25 DIAGNOSIS — Z79899 Other long term (current) drug therapy: Secondary | ICD-10-CM

## 2013-09-25 DIAGNOSIS — K668 Other specified disorders of peritoneum: Secondary | ICD-10-CM

## 2013-09-25 DIAGNOSIS — I4891 Unspecified atrial fibrillation: Secondary | ICD-10-CM | POA: Diagnosis present

## 2013-09-25 DIAGNOSIS — J95821 Acute postprocedural respiratory failure: Secondary | ICD-10-CM | POA: Diagnosis not present

## 2013-09-25 DIAGNOSIS — E785 Hyperlipidemia, unspecified: Secondary | ICD-10-CM | POA: Diagnosis present

## 2013-09-25 DIAGNOSIS — M109 Gout, unspecified: Secondary | ICD-10-CM | POA: Diagnosis present

## 2013-09-25 DIAGNOSIS — K7030 Alcoholic cirrhosis of liver without ascites: Secondary | ICD-10-CM | POA: Diagnosis present

## 2013-09-25 DIAGNOSIS — E46 Unspecified protein-calorie malnutrition: Secondary | ICD-10-CM | POA: Diagnosis present

## 2013-09-25 DIAGNOSIS — I10 Essential (primary) hypertension: Secondary | ICD-10-CM | POA: Diagnosis present

## 2013-09-25 DIAGNOSIS — A419 Sepsis, unspecified organism: Secondary | ICD-10-CM

## 2013-09-25 DIAGNOSIS — J4489 Other specified chronic obstructive pulmonary disease: Secondary | ICD-10-CM | POA: Diagnosis present

## 2013-09-25 DIAGNOSIS — F1020 Alcohol dependence, uncomplicated: Secondary | ICD-10-CM | POA: Diagnosis present

## 2013-09-25 HISTORY — DX: Ileus, unspecified: K56.7

## 2013-09-25 HISTORY — DX: Paroxysmal atrial fibrillation: I48.0

## 2013-09-25 HISTORY — DX: Other postprocedural complications and disorders of digestive system: K91.89

## 2013-09-25 LAB — URINE MICROSCOPIC-ADD ON

## 2013-09-25 LAB — CBC WITH DIFFERENTIAL/PLATELET
Basophils Absolute: 0 10*3/uL (ref 0.0–0.1)
Basophils Relative: 0 % (ref 0–1)
Eosinophils Absolute: 0 10*3/uL (ref 0.0–0.7)
Eosinophils Relative: 0 % (ref 0–5)
Hemoglobin: 9.8 g/dL — ABNORMAL LOW (ref 13.0–17.0)
Lymphocytes Relative: 6 % — ABNORMAL LOW (ref 12–46)
MCH: 27.8 pg (ref 26.0–34.0)
MCHC: 32.3 g/dL (ref 30.0–36.0)
MCV: 85.8 fL (ref 78.0–100.0)
Monocytes Absolute: 1.1 10*3/uL — ABNORMAL HIGH (ref 0.1–1.0)
Monocytes Relative: 6 % (ref 3–12)
Neutro Abs: 15.7 10*3/uL — ABNORMAL HIGH (ref 1.7–7.7)
Platelets: 223 10*3/uL (ref 150–400)
RDW: 14.3 % (ref 11.5–15.5)
WBC: 17.9 10*3/uL — ABNORMAL HIGH (ref 4.0–10.5)

## 2013-09-25 LAB — URINALYSIS, ROUTINE W REFLEX MICROSCOPIC
Bilirubin Urine: NEGATIVE
Glucose, UA: NEGATIVE mg/dL
Hgb urine dipstick: NEGATIVE
Ketones, ur: NEGATIVE mg/dL
Nitrite: NEGATIVE
Urobilinogen, UA: 2 mg/dL — ABNORMAL HIGH (ref 0.0–1.0)
pH: 6.5 (ref 5.0–8.0)

## 2013-09-25 LAB — CG4 I-STAT (LACTIC ACID): Lactic Acid, Venous: 2.16 mmol/L (ref 0.5–2.2)

## 2013-09-25 LAB — COMPREHENSIVE METABOLIC PANEL
ALT: 27 U/L (ref 0–53)
Albumin: 2.9 g/dL — ABNORMAL LOW (ref 3.5–5.2)
Alkaline Phosphatase: 109 U/L (ref 39–117)
BUN: 19 mg/dL (ref 6–23)
Calcium: 8.7 mg/dL (ref 8.4–10.5)
Chloride: 102 mEq/L (ref 96–112)
Potassium: 3.9 mEq/L (ref 3.5–5.1)
Sodium: 136 mEq/L (ref 135–145)
Total Bilirubin: 0.3 mg/dL (ref 0.3–1.2)
Total Protein: 6.8 g/dL (ref 6.0–8.3)

## 2013-09-25 LAB — LIPASE, BLOOD: Lipase: 32 U/L (ref 11–59)

## 2013-09-25 MED ORDER — PIPERACILLIN-TAZOBACTAM 3.375 G IVPB 30 MIN
3.3750 g | Freq: Once | INTRAVENOUS | Status: AC
  Administered 2013-09-25: 3.375 g via INTRAVENOUS
  Filled 2013-09-25: qty 50

## 2013-09-25 MED ORDER — SODIUM CHLORIDE 0.9 % IV BOLUS (SEPSIS)
500.0000 mL | Freq: Once | INTRAVENOUS | Status: AC
  Administered 2013-09-25: 500 mL via INTRAVENOUS

## 2013-09-25 MED ORDER — IOHEXOL 300 MG/ML  SOLN
25.0000 mL | Freq: Once | INTRAMUSCULAR | Status: AC | PRN
  Administered 2013-09-25: 25 mL via ORAL

## 2013-09-25 MED ORDER — IOHEXOL 300 MG/ML  SOLN
100.0000 mL | Freq: Once | INTRAMUSCULAR | Status: AC | PRN
  Administered 2013-09-25: 100 mL via INTRAVENOUS

## 2013-09-25 NOTE — ED Notes (Signed)
Pt from [facility] with c/o abdominal pain and fever.  Per facility, pt's temp was 102.2.  Pt given a gram of tylenol.  Pt's temp is 102.6 on arrival.  Pt warm to touch.  Pt sts abdominal pain has been present for 2 days.  Denies nvd.

## 2013-09-25 NOTE — ED Provider Notes (Signed)
CSN: [id number]     Arrival date & time 09/25/13  2003 History   First MD Initiated Contact with Patient 09/25/13 2031     Chief Complaint  Patient presents with  . Fever  . Abdominal Pain    Patient is a 72 y.o. male presenting with fever and abdominal pain. The history is provided by the patient.  Fever Severity:  Moderate Onset quality:  Sudden Duration: several hours. Timing:  Constant Progression:  Worsening Chronicity:  New Relieved by:  Nothing Worsened by:  Nothing tried Associated symptoms: cough   Associated symptoms: no chest pain, no diarrhea and no vomiting   Abdominal Pain Associated symptoms: cough and fever   Associated symptoms: no chest pain, no diarrhea and no vomiting   pt presents from nursing facility for fever He also reports abd pain and cough No cp No HA No rash No vomiting reported No travel reported Past Medical History  Diagnosis Date  . Hypertension   . Coronary artery disease   . Hyperlipemia   . Angina   . Shortness of breath   . Mental disorder   . Arthritis   . COPD (chronic obstructive pulmonary disease)   . Hiatal hernia   . Depression   . Gout   . Joint pain   . Depression   . Anxiety    Past Surgical History  Procedure Laterality Date  . Abdominal surgery     History reviewed. No pertinent family history. History  Substance Use Topics  . Smoking status: Current Every Day Smoker -- 1.00 packs/day for 15 years    Types: Cigarettes  . Smokeless tobacco: Former Neurosurgeon  . Alcohol Use: 7.2 oz/week    12 Cans of beer per week     Comment: pt heavy drinker, pt stets at lest 2-3 40oz per day    Review of Systems  Constitutional: Positive for fever.  Respiratory: Positive for cough.   Cardiovascular: Negative for chest pain.  Gastrointestinal: Positive for abdominal pain. Negative for vomiting and diarrhea.  All other systems reviewed and are negative.    Allergies  Review of patient's allergies indicates no known  allergies.  Home Medications   Current Outpatient Rx  Name  Route  Sig  Dispense  Refill  . albuterol (PROVENTIL HFA;VENTOLIN HFA) 108 (90 BASE) MCG/ACT inhaler   Inhalation   Inhale 2 puffs into the lungs every 6 (six) hours as needed for wheezing.         . diltiazem (DILACOR XR) 120 MG 24 hr capsule   Oral   Take 1 capsule (120 mg total) by mouth daily.         Marland Kitchen dronabinol (MARINOL) 2.5 MG capsule   Oral   Take 1 capsule (2.5 mg total) by mouth 2 (two) times daily before lunch and supper.         . gabapentin (NEURONTIN) 100 MG capsule   Oral   Take 100 mg by mouth 3 (three) times daily.         . metoprolol tartrate (LOPRESSOR) 25 MG tablet   Oral   Take 25 mg by mouth 2 (two) times daily.         . naproxen (NAPROSYN) 500 MG tablet   Oral   Take 500 mg by mouth 2 (two) times daily with a meal.         . sulfamethoxazole-trimethoprim (BACTRIM DS) 800-160 MG per tablet      1 tab po bid x4d         .  traMADol (ULTRAM) 50 MG tablet   Oral   Take 50-100 mg by mouth every 6 (six) hours as needed for pain.         . traZODone (DESYREL) 50 MG tablet   Oral   Take 50-100 mg by mouth at bedtime as needed for sleep. *takes an extra 50mg  if need help sleeping          BP 127/75  Pulse 78  Temp(Src) 102.6 F (39.2 C) (Oral)  Ht 5' 6.5" (1.689 m)  Wt 145 lb (65.772 kg)  BMI 23.06 kg/m2  SpO2 93% BP 123/52  Pulse 78  Temp(Src) 101.2 F (38.4 C) (Oral)  Resp 26  Ht 5' 6.5" (1.689 m)  Wt 145 lb (65.772 kg)  BMI 23.06 kg/m2  SpO2 93%  Physical Exam CONSTITUTIONAL: elderly, frail HEAD: Normocephalic/atraumatic EYES: EOMI/PERRL ENMT: Mucous membranes moist NECK: supple no meningeal signs SPINE:entire spine nontender CV: S1/S2 noted, no murmurs/rubs/gallops noted LUNGS: decreased BS noted bilaterally, no apparent distress ABDOMEN: soft, abdominal wall hernia noted that is reducible, no rebound or guarding, diffuse tenderness noted GU:no cva  tenderness NEURO: Pt is awake/alert, moves all extremitiesx4 EXTREMITIES: pulses normal, full ROM SKIN: warm, color normal PSYCH: no abnormalities of mood noted  ED Course  Procedures  CRITICAL CARE Performed by: Joya Gaskins Total critical care time: 35 Critical care time was exclusive of separately billable procedures and treating other patients. Critical care was necessary to treat or prevent imminent or life-threatening deterioration. Critical care was time spent personally by me on the following activities: development of treatment plan with patient and/or surrogate as well as nursing, discussions with consultants, evaluation of patient's response to treatment, examination of patient, obtaining history from patient or surrogate, ordering and performing treatments and interventions, ordering and review of laboratory studies, ordering and review of radiographic studies, pulse oximetry and re-evaluation of patient's condition.  Labs Review Labs Reviewed  COMPREHENSIVE METABOLIC PANEL - Abnormal; Notable for the following:    Glucose, Bld 128 (*)    Albumin 2.9 (*)    GFR calc non Af Amer 73 (*)    GFR calc Af Amer 85 (*)    All other components within normal limits  CBC WITH DIFFERENTIAL - Abnormal; Notable for the following:    WBC 17.9 (*)    RBC 3.53 (*)    Hemoglobin 9.8 (*)    HCT 30.3 (*)    Neutrophils Relative % 88 (*)    Neutro Abs 15.7 (*)    Lymphocytes Relative 6 (*)    Monocytes Absolute 1.1 (*)    All other components within normal limits  URINE CULTURE  LIPASE, BLOOD  URINALYSIS, ROUTINE W REFLEX MICROSCOPIC  CG4 I-STAT (LACTIC ACID)   Imaging Review Dg Chest Portable 1 View  09/25/2013   CLINICAL DATA:  Fever, smoker  EXAM: PORTABLE CHEST - 1 VIEW  COMPARISON:  07/13/2013  FINDINGS: Normal heart size and vascularity. Monitor leads overlie the chest. Minor basilar atelectasis versus scarring. Negative for CHF or pneumonia. No large effusion or  pneumothorax. Left costophrenic angle is incompletely visualized. Trachea is midline. Overall stable exam.  IMPRESSION: No acute chest process. Stable exam.   Electronically Signed   By: Ruel Favors M.D.   On: 09/25/2013 21:11    EKG Interpretation     Ventricular Rate:  74 PR Interval:  164 QRS Duration: 98 QT Interval:  373 QTC Calculation: 414 R Axis:   49 Text Interpretation:  Sinus rhythm Borderline T wave  abnormalities nml intervals  Non-specific ST-t changes No significant change since last tracing            10:39 PM Cxr/urine is negative Pt with continued abdominal pain that is worsening Will get CT imaging due to fever/abdominal pain 11:54 PM Ct imaging reveals pneumoperitoneum I discussed imaging with radiology I also discussed case with dr [name] with surgery Pt has been made aware of findings IV antibiotics have been ordered Pt has been stabilized in the ER    MDM  No diagnosis found. Nursing notes including past medical history and social history reviewed and considered in documentation xrays reviewed and considered Labs/vital reviewed and considered     Joya Gaskins, MD 09/25/13 2359

## 2013-09-26 ENCOUNTER — Encounter (HOSPITAL_COMMUNITY): Payer: Medicare Other | Admitting: Anesthesiology

## 2013-09-26 ENCOUNTER — Non-Acute Institutional Stay (SKILLED_NURSING_FACILITY): Payer: Medicare Other | Admitting: Family

## 2013-09-26 ENCOUNTER — Encounter: Payer: Self-pay | Admitting: Gastroenterology

## 2013-09-26 ENCOUNTER — Emergency Department (HOSPITAL_COMMUNITY): Payer: Medicare Other | Admitting: Anesthesiology

## 2013-09-26 ENCOUNTER — Encounter: Payer: Self-pay | Admitting: Family

## 2013-09-26 ENCOUNTER — Encounter (HOSPITAL_COMMUNITY): Admission: EM | Disposition: A | Discharge status: Skilled Nursing Facility | Payer: Self-pay | Source: Home / Self Care

## 2013-09-26 ENCOUNTER — Inpatient Hospital Stay (HOSPITAL_COMMUNITY): Payer: Medicare Other

## 2013-09-26 ENCOUNTER — Encounter (HOSPITAL_COMMUNITY): Payer: Self-pay | Admitting: Anesthesiology

## 2013-09-26 DIAGNOSIS — J9600 Acute respiratory failure, unspecified whether with hypoxia or hypercapnia: Secondary | ICD-10-CM

## 2013-09-26 DIAGNOSIS — K631 Perforation of intestine (nontraumatic): Secondary | ICD-10-CM | POA: Diagnosis present

## 2013-09-26 DIAGNOSIS — J95821 Acute postprocedural respiratory failure: Secondary | ICD-10-CM | POA: Diagnosis not present

## 2013-09-26 DIAGNOSIS — K660 Peritoneal adhesions (postprocedural) (postinfection): Secondary | ICD-10-CM

## 2013-09-26 DIAGNOSIS — K4090 Unilateral inguinal hernia, without obstruction or gangrene, not specified as recurrent: Secondary | ICD-10-CM

## 2013-09-26 HISTORY — PX: LAPAROTOMY: SHX154

## 2013-09-26 LAB — CBC WITH DIFFERENTIAL/PLATELET
Basophils Absolute: 0 10*3/uL (ref 0.0–0.1)
Basophils Relative: 0 % (ref 0–1)
Eosinophils Absolute: 0 10*3/uL (ref 0.0–0.7)
Eosinophils Relative: 0 % (ref 0–5)
Hemoglobin: 9.2 g/dL — ABNORMAL LOW (ref 13.0–17.0)
Lymphs Abs: 2.1 10*3/uL (ref 0.7–4.0)
MCH: 28.1 pg (ref 26.0–34.0)
MCHC: 32.5 g/dL (ref 30.0–36.0)
MCV: 86.5 fL (ref 78.0–100.0)
Monocytes Absolute: 1 10*3/uL (ref 0.1–1.0)
Monocytes Relative: 5 % (ref 3–12)
Platelets: 199 10*3/uL (ref 150–400)
RBC: 3.27 MIL/uL — ABNORMAL LOW (ref 4.22–5.81)

## 2013-09-26 LAB — BLOOD GAS, ARTERIAL
Acid-base deficit: 1.6 mmol/L (ref 0.0–2.0)
Bicarbonate: 24.3 mEq/L — ABNORMAL HIGH (ref 20.0–24.0)
Drawn by: 10006
FIO2: 0.5 %
O2 Saturation: 97.1 %
PEEP: 5 cmH2O
Patient temperature: 98.6
RATE: 14 resp/min
TCO2: 26 mmol/L (ref 0–100)
pO2, Arterial: 108 mmHg — ABNORMAL HIGH (ref 80.0–100.0)

## 2013-09-26 LAB — GLUCOSE, CAPILLARY: Glucose-Capillary: 121 mg/dL — ABNORMAL HIGH (ref 70–99)

## 2013-09-26 LAB — BASIC METABOLIC PANEL
BUN: 14 mg/dL (ref 6–23)
Calcium: 8.5 mg/dL (ref 8.4–10.5)
Chloride: 103 mEq/L (ref 96–112)
Creatinine, Ser: 0.94 mg/dL (ref 0.50–1.35)
GFR calc Af Amer: >90 mL/min (ref >90)
GFR calc non Af Amer: 83 mL/min — ABNORMAL LOW (ref >90)
Glucose, Bld: 132 mg/dL — ABNORMAL HIGH (ref 70–99)
Sodium: 137 mEq/L (ref 135–145)

## 2013-09-26 LAB — MRSA PCR SCREENING: MRSA by PCR: NEGATIVE

## 2013-09-26 LAB — TYPE AND SCREEN: Antibody Screen: NEGATIVE

## 2013-09-26 SURGERY — LAPAROTOMY, EXPLORATORY
Anesthesia: General | Site: Abdomen | Wound class: Clean Contaminated

## 2013-09-26 MED ORDER — MIDAZOLAM HCL 5 MG/5ML IJ SOLN
INTRAMUSCULAR | Status: DC | PRN
  Administered 2013-09-26 (×2): 2 mg via INTRAVENOUS

## 2013-09-26 MED ORDER — OXYCODONE HCL 5 MG PO TABS: 5.0000 mg | ORAL_TABLET | Freq: Once | ORAL | Status: AC | PRN

## 2013-09-26 MED ORDER — PIPERACILLIN-TAZOBACTAM 3.375 G IVPB
3.3750 g | Freq: Three times a day (TID) | INTRAVENOUS | Status: DC
  Administered 2013-09-26 – 2013-10-03 (×22): 3.375 g via INTRAVENOUS
  Filled 2013-09-26 (×28): qty 50

## 2013-09-26 MED ORDER — METOPROLOL TARTRATE 1 MG/ML IV SOLN
5.0000 mg | Freq: Four times a day (QID) | INTRAVENOUS | Status: DC
  Administered 2013-09-26 – 2013-09-28 (×8): 5 mg via INTRAVENOUS
  Filled 2013-09-26 (×15): qty 5

## 2013-09-26 MED ORDER — LIDOCAINE HCL (CARDIAC) 20 MG/ML IV SOLN
INTRAVENOUS | Status: DC | PRN
  Administered 2013-09-26: 50 mg via INTRAVENOUS

## 2013-09-26 MED ORDER — PROPOFOL 10 MG/ML IV EMUL
5.0000 ug/kg/min | INTRAVENOUS | Status: DC
  Administered 2013-09-26: 10 ug/kg/min via INTRAVENOUS
  Filled 2013-09-26: qty 100

## 2013-09-26 MED ORDER — OXYCODONE HCL 5 MG/5ML PO SOLN: 5.0000 mg | Freq: Once | ORAL | Status: AC | PRN

## 2013-09-26 MED ORDER — FENTANYL CITRATE 0.05 MG/ML IJ SOLN
INTRAMUSCULAR | Status: DC | PRN
  Administered 2013-09-26 (×5): 50 ug via INTRAVENOUS

## 2013-09-26 MED ORDER — ONDANSETRON HCL 4 MG/2ML IJ SOLN
INTRAMUSCULAR | Status: DC | PRN
  Administered 2013-09-26: 4 mg via INTRAVENOUS

## 2013-09-26 MED ORDER — HYDROMORPHONE HCL PF 1 MG/ML IJ SOLN: 0.2500 mg | INTRAMUSCULAR | Status: DC | PRN

## 2013-09-26 MED ORDER — ALBUTEROL SULFATE (5 MG/ML) 0.5% IN NEBU
2.5000 mg | INHALATION_SOLUTION | Freq: Four times a day (QID) | RESPIRATORY_TRACT | Status: DC
  Administered 2013-09-26 – 2013-09-27 (×4): 2.5 mg via RESPIRATORY_TRACT
  Filled 2013-09-26 (×5): qty 0.5

## 2013-09-26 MED ORDER — HEPARIN SODIUM (PORCINE) 5000 UNIT/ML IJ SOLN
5000.0000 [IU] | Freq: Three times a day (TID) | INTRAMUSCULAR | Status: DC
  Administered 2013-09-26 – 2013-09-27 (×3): 5000 [IU] via SUBCUTANEOUS
  Filled 2013-09-26 (×6): qty 1

## 2013-09-26 MED ORDER — FENTANYL BOLUS VIA INFUSION
25.0000 ug | Freq: Four times a day (QID) | INTRAVENOUS | Status: DC | PRN
  Filled 2013-09-26: qty 100

## 2013-09-26 MED ORDER — ALBUMIN HUMAN 5 % IV SOLN
INTRAVENOUS | Status: DC | PRN
  Administered 2013-09-26 (×2): via INTRAVENOUS

## 2013-09-26 MED ORDER — ONDANSETRON HCL 4 MG/2ML IJ SOLN: 4.0000 mg | Freq: Four times a day (QID) | INTRAMUSCULAR | Status: DC | PRN

## 2013-09-26 MED ORDER — IPRATROPIUM BROMIDE 0.02 % IN SOLN
0.5000 mg | Freq: Four times a day (QID) | RESPIRATORY_TRACT | Status: DC
  Administered 2013-09-26 – 2013-09-27 (×4): 0.5 mg via RESPIRATORY_TRACT
  Filled 2013-09-26 (×5): qty 2.5

## 2013-09-26 MED ORDER — FENTANYL CITRATE 0.05 MG/ML IJ SOLN
25.0000 ug/h | INTRAMUSCULAR | Status: DC
  Administered 2013-09-26: 25 ug/h via INTRAVENOUS
  Filled 2013-09-26: qty 50

## 2013-09-26 MED ORDER — VECURONIUM BROMIDE 10 MG IV SOLR
INTRAVENOUS | Status: DC | PRN
  Administered 2013-09-26 (×2): 4 mg via INTRAVENOUS
  Administered 2013-09-26: 2 mg via INTRAVENOUS

## 2013-09-26 MED ORDER — HYDROMORPHONE HCL PF 1 MG/ML IJ SOLN
0.5000 mg | INTRAMUSCULAR | Status: DC | PRN
  Administered 2013-09-26 – 2013-09-27 (×5): 1 mg via INTRAVENOUS
  Filled 2013-09-26 (×5): qty 1

## 2013-09-26 MED ORDER — PANTOPRAZOLE SODIUM 40 MG IV SOLR
40.0000 mg | INTRAVENOUS | Status: DC
  Administered 2013-09-26 – 2013-10-02 (×7): 40 mg via INTRAVENOUS
  Filled 2013-09-26 (×10): qty 40

## 2013-09-26 MED ORDER — SODIUM CHLORIDE 0.9 % IV SOLN
1.0000 g | Freq: Once | INTRAVENOUS | Status: DC
  Filled 2013-09-26 (×2): qty 1

## 2013-09-26 MED ORDER — CHLORHEXIDINE GLUCONATE 0.12 % MT SOLN
15.0000 mL | Freq: Two times a day (BID) | OROMUCOSAL | Status: DC
  Administered 2013-09-26 – 2013-10-05 (×18): 15 mL via OROMUCOSAL
  Filled 2013-09-26 (×18): qty 15

## 2013-09-26 MED ORDER — KCL IN DEXTROSE-NACL 20-5-0.45 MEQ/L-%-% IV SOLN
INTRAVENOUS | Status: DC
  Administered 2013-09-26: 100 mL/h via INTRAVENOUS
  Administered 2013-09-26 – 2013-09-27 (×3): via INTRAVENOUS
  Administered 2013-09-28: 100 mL/h via INTRAVENOUS
  Administered 2013-09-29 – 2013-10-05 (×9): via INTRAVENOUS
  Filled 2013-09-26 (×25): qty 1000

## 2013-09-26 MED ORDER — LACTATED RINGERS IV SOLN
INTRAVENOUS | Status: DC | PRN
  Administered 2013-09-26 (×2): via INTRAVENOUS

## 2013-09-26 MED ORDER — IPRATROPIUM BROMIDE 0.02 % IN SOLN
0.5000 mg | RESPIRATORY_TRACT | Status: DC
  Administered 2013-09-26 (×2): 0.5 mg via RESPIRATORY_TRACT
  Filled 2013-09-26: qty 2.5

## 2013-09-26 MED ORDER — ALBUTEROL SULFATE HFA 108 (90 BASE) MCG/ACT IN AERS
8.0000 | INHALATION_SPRAY | RESPIRATORY_TRACT | Status: DC | PRN
  Filled 2013-09-26: qty 6.7

## 2013-09-26 MED ORDER — SUCCINYLCHOLINE CHLORIDE 20 MG/ML IJ SOLN
INTRAMUSCULAR | Status: DC | PRN
  Administered 2013-09-26: 120 mg via INTRAVENOUS

## 2013-09-26 MED ORDER — ARTIFICIAL TEARS OP OINT
TOPICAL_OINTMENT | OPHTHALMIC | Status: DC | PRN
  Administered 2013-09-26: 1 via OPHTHALMIC

## 2013-09-26 MED ORDER — 0.9 % SODIUM CHLORIDE (POUR BTL) OPTIME
TOPICAL | Status: DC | PRN
  Administered 2013-09-26: 3000 mL

## 2013-09-26 MED ORDER — ALBUTEROL SULFATE (5 MG/ML) 0.5% IN NEBU
2.5000 mg | INHALATION_SOLUTION | RESPIRATORY_TRACT | Status: DC
  Administered 2013-09-26 (×2): 2.5 mg via RESPIRATORY_TRACT
  Filled 2013-09-26: qty 0.5

## 2013-09-26 MED ORDER — PROPOFOL 10 MG/ML IV BOLUS
INTRAVENOUS | Status: DC | PRN
  Administered 2013-09-26: 150 mg via INTRAVENOUS

## 2013-09-26 SURGICAL SUPPLY — 47 items
BLADE SURG ROTATE 9660 (MISCELLANEOUS) IMPLANT
CANISTER SUCTION 2500CC (MISCELLANEOUS) ×2 IMPLANT
CHLORAPREP W/TINT 26ML (MISCELLANEOUS) ×2 IMPLANT
COVER MAYO STAND STRL (DRAPES) IMPLANT
COVER SURGICAL LIGHT HANDLE (MISCELLANEOUS) ×2 IMPLANT
DRAPE LAPAROSCOPIC ABDOMINAL (DRAPES) ×2 IMPLANT
DRAPE PROXIMA HALF (DRAPES) IMPLANT
DRAPE UTILITY 15X26 W/TAPE STR (DRAPE) ×4 IMPLANT
DRAPE WARM FLUID 44X44 (DRAPE) ×2 IMPLANT
DRSG OPSITE POSTOP 4X10 (GAUZE/BANDAGES/DRESSINGS) IMPLANT
DRSG OPSITE POSTOP 4X8 (GAUZE/BANDAGES/DRESSINGS) IMPLANT
DRSG PAD ABDOMINAL 8X10 ST (GAUZE/BANDAGES/DRESSINGS) ×1 IMPLANT
ELECT BLADE 6.5 EXT (BLADE) IMPLANT
ELECT CAUTERY BLADE 6.4 (BLADE) ×4 IMPLANT
ELECT REM PT RETURN 9FT ADLT (ELECTROSURGICAL) ×2
ELECTRODE REM PT RTRN 9FT ADLT (ELECTROSURGICAL) ×1 IMPLANT
GLOVE BIO SURGEON STRL SZ8 (GLOVE) ×2 IMPLANT
GLOVE BIOGEL PI IND STRL 8 (GLOVE) ×1 IMPLANT
GLOVE BIOGEL PI INDICATOR 8 (GLOVE) ×1
GOWN STRL NON-REIN LRG LVL3 (GOWN DISPOSABLE) ×4 IMPLANT
GOWN STRL REIN XL XLG (GOWN DISPOSABLE) ×2 IMPLANT
KIT BASIN OR (CUSTOM PROCEDURE TRAY) ×2 IMPLANT
KIT COLOSTOMY ILEOSTOMY 4 (WOUND CARE) ×1 IMPLANT
KIT OSTOMY DRAINABLE 2.75 STR (WOUND CARE) ×1 IMPLANT
KIT ROOM TURNOVER OR (KITS) ×2 IMPLANT
LIGASURE IMPACT 36 18CM CVD LR (INSTRUMENTS) IMPLANT
NS IRRIG 1000ML POUR BTL (IV SOLUTION) ×4 IMPLANT
PACK GENERAL/GYN (CUSTOM PROCEDURE TRAY) ×2 IMPLANT
PAD ARMBOARD 7.5X6 YLW CONV (MISCELLANEOUS) ×2 IMPLANT
PENCIL BUTTON HOLSTER BLD 10FT (ELECTRODE) IMPLANT
SPECIMEN JAR LARGE (MISCELLANEOUS) IMPLANT
SPONGE GAUZE 4X4 12PLY (GAUZE/BANDAGES/DRESSINGS) ×1 IMPLANT
SPONGE LAP 18X18 X RAY DECT (DISPOSABLE) IMPLANT
STAPLER CUT CVD 40MM BLUE (STAPLE) ×1 IMPLANT
STAPLER CUT RELOAD BLUE (STAPLE) ×1 IMPLANT
STAPLER VISISTAT 35W (STAPLE) ×2 IMPLANT
SUCTION POOLE TIP (SUCTIONS) ×2 IMPLANT
SUT PDS AB 1 TP1 96 (SUTURE) ×4 IMPLANT
SUT SILK 2 0 SH CR/8 (SUTURE) ×2 IMPLANT
SUT SILK 2 0 TIES 10X30 (SUTURE) ×2 IMPLANT
SUT SILK 3 0 SH CR/8 (SUTURE) ×2 IMPLANT
SUT SILK 3 0 TIES 10X30 (SUTURE) ×2 IMPLANT
TAPE CLOTH SURG 4X10 WHT LF (GAUZE/BANDAGES/DRESSINGS) ×1 IMPLANT
TOWEL OR 17X26 10 PK STRL BLUE (TOWEL DISPOSABLE) ×2 IMPLANT
TRAY FOLEY CATH 16FRSI W/METER (SET/KITS/TRAYS/PACK) IMPLANT
TUBE CONNECTING 12X1/4 (SUCTIONS) IMPLANT
YANKAUER SUCT BULB TIP NO VENT (SUCTIONS) IMPLANT

## 2013-09-26 NOTE — Progress Notes (Signed)
Day of Surgery  Subjective: Awake on the vent, dressing are dry, he's a bit anxious, but looks fairly stable now.  Objective: Vital signs in last 24 hours: Temp:  [98.2 F (36.8 C)-103.1 F (39.5 C)] 98.2 F (36.8 C) (10/24 0740) Pulse Rate:  [56-83] 56 (10/24 0800) Resp:  [14-26] 18 (10/24 0800) BP: (116-160)/(47-119) 126/52 mmHg (10/24 0800) SpO2:  [93 %-100 %] 100 % (10/24 0800) FiO2 (%):  [50 %] 50 % (10/24 0800) Weight:  [65.772 kg (145 lb)-70.8 kg (156 lb 1.4 oz)] 70.8 kg (156 lb 1.4 oz) (10/24 0415)  TM 103 at 8PM last evening,  Afebrile since 3AM VSS 7.27 PH at 0455 this morning on Vent No labs yet will recheck this Am Intake/Output from previous day: 10/23 0701 - 10/24 0700 In: 2714.1 [I.V.:2214.1; IV Piggyback:500] Out: 712 [Urine:490; Drains:122; Blood:100] Intake/Output this shift: Total I/O In: 111.4 [I.V.:111.4] Out: 30 [Urine:30]  General appearance: alert, cooperative and no distress Resp: clear to auscultation bilaterally and anterior exam GI: dressing is dry, no bowel sounds.  Lab Results:   Recent Labs  09/25/13 2030  WBC 17.9*  HGB 9.8*  HCT 30.3*  PLT 223    BMET  Recent Labs  09/25/13 2030  NA 136  K 3.9  CL 102  CO2 28  GLUCOSE 128*  BUN 19  CREATININE 1.01  CALCIUM 8.7   PT/INR No results found for this basename: LABPROT, INR,  in the last 72 hours   Recent Labs Lab 09/25/13 2030  AST 34  ALT 27  ALKPHOS 109  BILITOT 0.3  PROT 6.8  ALBUMIN 2.9*     Lipase     Component Value Date/Time   LIPASE 32 09/25/2013 2030     Studies/Results: Ct Abdomen Pelvis W Contrast  09/25/2013   CLINICAL DATA:  Diffuse abdominal pain.  EXAM: CT ABDOMEN AND PELVIS WITH CONTRAST  TECHNIQUE: Multidetector CT imaging of the abdomen and pelvis was performed using the standard protocol following bolus administration of intravenous contrast.  CONTRAST:  OMNIPAQUE IOHEXOL 300 MG/ML  SOLN  COMPARISON:  06/30/2013  FINDINGS: Lung bases  demonstrate interval resolution or evacuation of the previously demonstrated hemoperitoneum with residual scarring or atelectasis in both lung bases. Small residual left pleural effusion. Mild cardiac enlargement. Old right rib fractures.  Since the previous study, there is interval development of free intra-abdominal air and of subcutaneous emphysema in the anterior abdominal wall. There is an abnormal thick-walled bowel loop in the pelvis with free air in the adjacent mesentery and mesenteric infiltration. The no contrast extravasation is noted. Findings are worrisome for bowel necrosis and perforation. Small mesenteric abscess measuring about 2.3 cm diameter. No portal venous gas or pneumatosis.  Heterogeneous low-attenuation lesion in the posterior segment right lobe of the liver measures approximately 2.7 cm diameter. This is not have typical features of a cavernous hemangioma, although there is fill-in on the delayed imaging. The lesion has a cystic appearance on the previous study. This could represent atypical hemangioma possibly hemorrhage within a cyst. Metastasis is not entirely excluded if the patient has history of primary cancer. No other focal liver lesions are demonstrated. Cholelithiasis without gallbladder wall thickening. No bile duct dilatation. Pancreas, spleen, adrenal glands, kidneys, inferior vena cava, and retroperitoneal lymph nodes are unremarkable. Calcification of the abdominal aorta without aneurysm. Minimal calcification of the origin of the superior mesenteric artery. No free fluid in the pelvis as that no free fluid in the abdomen. Multiple anterior abdominal wall  hernias containing fat and transverse colon. No obstruction is suggested. The stomach is decompressed. Stool-filled colon without distention.  Pelvis: There is a moderate size right inguinal hernia containing fat. Gas collections are present in the hernia which may be arising from the abdomen although infectious process is  not excluded. The appendix is normal in extends partially into the hernia. Diffuse bladder wall thickening suggesting cystitis or hypertrophy. Colonic diverticula without. The the abscess and abnormal small bowel loop are adjacent to the sigmoid colon but there appears to be intervening fat. A perforated colonic diverticulitis with reactive inflammation of the small bowel loop could also potentially have this appearance. No significant pelvic lymphadenopathy. Degenerative changes in the lumbar spine. No destructive bone lesions are appreciated.  IMPRESSION: Free intra-abdominal air. Thick-walled bowel loop in the pelvis with adjacent abscess and gas in the mesentery. The findings are worrisome for bowel ischemia with perforation although inflammatory process such as perforated diverticulitis could also have this appearance. Multiple anterior abdominal wall hernias are similar to previous study. Indeterminate lesion in the right lobe of the liver. Cholelithiasis.  Results were discussed by telephone with Dr. Bebe Shaggy at 2332 hr on 09/25/2013.   Electronically Signed   By: Burman Nieves M.D.   On: 09/25/2013 23:38   Dg Chest Port 1 View  09/26/2013   CLINICAL DATA:  Intubated patient.  Evaluate endotracheal tube.  EXAM: PORTABLE CHEST - 1 VIEW  COMPARISON:  09/25/2013  FINDINGS: Endotracheal tube is 5.3 cm above the carina. Nasogastric tube extends into the abdomen. Again noted are healing left rib fractures. Few densities at the lung bases are suggestive for atelectasis. Negative for airspace disease or edema. Heart size is normal.  IMPRESSION: Support apparatuses as described.  No focal chest disease.   Electronically Signed   By: Richarda Overlie M.D.   On: 09/26/2013 07:43   Dg Chest Portable 1 View  09/25/2013   CLINICAL DATA:  Fever, smoker  EXAM: PORTABLE CHEST - 1 VIEW  COMPARISON:  07/13/2013  FINDINGS: Normal heart size and vascularity. Monitor leads overlie the chest. Minor basilar atelectasis versus  scarring. Negative for CHF or pneumonia. No large effusion or pneumothorax. Left costophrenic angle is incompletely visualized. Trachea is midline. Overall stable exam.  IMPRESSION: No acute chest process. Stable exam.   Electronically Signed   By: Ruel Favors M.D.   On: 09/25/2013 21:11    Medications: . ipratropium  0.5 mg Nebulization Q4H   And  . albuterol  2.5 mg Nebulization Q4H  . chlorhexidine  15 mL Mouth/Throat BID  . heparin  5,000 Units Subcutaneous Q8H  . metoprolol  5 mg Intravenous Q6H  . pantoprazole (PROTONIX) IV  40 mg Intravenous Q24H  . piperacillin-tazobactam (ZOSYN)  IV  3.375 g Intravenous Q8H   Prior to Admission medications   Medication Sig Start Date End Date Taking? Authorizing Provider  albuterol (PROVENTIL HFA;VENTOLIN HFA) 108 (90 BASE) MCG/ACT inhaler Inhale 2 puffs into the lungs every 6 (six) hours as needed for wheezing.   Yes Historical Provider, MD  Amino Acids-Protein Hydrolys (FEEDING SUPPLEMENT, PRO-STAT SUGAR FREE 64,) LIQD Take 30 mLs by mouth 2 (two) times daily.   Yes Historical Provider, MD  diltiazem (DILACOR XR) 120 MG 24 hr capsule Take 1 capsule (120 mg total) by mouth daily. 07/14/13  Yes Freeman Caldron, PA-C  dronabinol (MARINOL) 2.5 MG capsule Take 1 capsule (2.5 mg total) by mouth 2 (two) times daily before lunch and supper. 07/14/13  Yes Casimiro Needle  Gerrianne Scale, PA-C  gabapentin (NEURONTIN) 100 MG capsule Take 100 mg by mouth 3 (three) times daily.   Yes Historical Provider, MD  metoprolol tartrate (LOPRESSOR) 25 MG tablet Take 25 mg by mouth 2 (two) times daily.   Yes Historical Provider, MD  naproxen (NAPROSYN) 500 MG tablet Take 500 mg by mouth 2 (two) times daily with a meal.   Yes Historical Provider, MD  pantoprazole (PROTONIX) 40 MG tablet Take 40 mg by mouth daily.   Yes Historical Provider, MD  traMADol (ULTRAM) 50 MG tablet Take 50-100 mg by mouth every 6 (six) hours as needed for pain.   Yes Historical Provider, MD  traZODone  (DESYREL) 50 MG tablet Take 50-100 mg by mouth at bedtime as needed for sleep. *takes an extra 50mg  if need help sleeping   Yes Historical Provider, MD  Vitamin D, Ergocalciferol, (DRISDOL) 50000 UNITS CAPS capsule Take 50,000 Units by mouth every 30 (thirty) days.   Yes Historical Provider, MD   . dextrose 5 % and 0.45 % NaCl with KCl 20 mEq/L 100 mL/hr at 09/26/13 0500  . fentaNYL infusion INTRAVENOUS 50 mcg/hr (09/26/13 0800)  . propofol Stopped (09/26/13 0800)     Assessment/Plan Abdominal pain with Sigmoid colon perforation from chicken bone. 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  Let him get up to chair once he is extubated.  Ask OT/ PT to see tomorrow.        LOS: 1 day    Latorie Montesano 09/26/2013

## 2013-09-26 NOTE — Consult Note (Signed)
PULMONARY  / CRITICAL CARE MEDICINE  Name: Zachary [name] MRN: [id number] DOB: [birth year]    ADMISSION DATE:  09/25/2013 CONSULTATION DATE:  09/25/2013  REFERRING MD :  [name] PRIMARY SERVICE: [facility]  CHIEF COMPLAINT:  Post op respiratory failure  BRIEF PATIENT DESCRIPTION: 72 y/o male underwent an ex-lap, partial sigmoid colectomy and colostomy on 10/24 early in the AM for sigmoid colon rupture and PCCM was consulted for post op vent management.  SIGNIFICANT EVENTS / STUDIES:  10/23 CT abdomen > free intra-abdominal air, thick-walled bowel loop in pelvis with adjacent abscess and gas in the mesentery, worrisome for perforation 10/24 exlap, lysis of adhesions, partial sigmoid colectomy, colostomy  LINES / TUBES: 10/24 ETT >>  CULTURES: 10/24 body fluid culture > 10/24 urine culture >   ANTIBIOTICS: 10/24 zosyn >   HISTORY OF PRESENT ILLNESS:  72 y/o male underwent an ex-lap, partial sigmoid colectomy and colostomy on 10/24 early in the AM for sigmoid colon rupture and PCCM was consulted for post op vent management.  The patient was intubated when I arrived so history was obtained by chart review.  He complained of three days of abdominal pain in the ED on 10/23.  He was found to have free air so he went for emergent surgery early in the morning of 10/24.  PAST MEDICAL HISTORY :  Past Medical History  Diagnosis Date  . Hypertension   . Coronary artery disease   . Hyperlipemia   . Angina   . Shortness of breath   . Mental disorder   . Arthritis   . COPD (chronic obstructive pulmonary disease)   . Hiatal hernia   . Depression   . Gout   . Joint pain   . Depression   . Anxiety    Past Surgical History  Procedure Laterality Date  . Abdominal surgery     Prior to Admission medications   Medication Sig Start Date End Date Taking? Authorizing Provider  albuterol (PROVENTIL HFA;VENTOLIN HFA) 108 (90 BASE) MCG/ACT inhaler Inhale 2 puffs into the lungs every 6 (six) hours  as needed for wheezing.   Yes Historical Provider, MD  Amino Acids-Protein Hydrolys (FEEDING SUPPLEMENT, PRO-STAT SUGAR FREE 64,) LIQD Take 30 mLs by mouth 2 (two) times daily.   Yes Historical Provider, MD  diltiazem (DILACOR XR) 120 MG 24 hr capsule Take 1 capsule (120 mg total) by mouth daily. 07/14/13  Yes Freeman Caldron, PA-C  dronabinol (MARINOL) 2.5 MG capsule Take 1 capsule (2.5 mg total) by mouth 2 (two) times daily before lunch and supper. 07/14/13  Yes Freeman Caldron, PA-C  gabapentin (NEURONTIN) 100 MG capsule Take 100 mg by mouth 3 (three) times daily.   Yes Historical Provider, MD  metoprolol tartrate (LOPRESSOR) 25 MG tablet Take 25 mg by mouth 2 (two) times daily.   Yes Historical Provider, MD  naproxen (NAPROSYN) 500 MG tablet Take 500 mg by mouth 2 (two) times daily with a meal.   Yes Historical Provider, MD  pantoprazole (PROTONIX) 40 MG tablet Take 40 mg by mouth daily.   Yes Historical Provider, MD  traMADol (ULTRAM) 50 MG tablet Take 50-100 mg by mouth every 6 (six) hours as needed for pain.   Yes Historical Provider, MD  traZODone (DESYREL) 50 MG tablet Take 50-100 mg by mouth at bedtime as needed for sleep. *takes an extra 50mg  if need help sleeping   Yes Historical Provider, MD  Vitamin D, Ergocalciferol, (DRISDOL) 50000 UNITS CAPS capsule Take 50,000 Units by  mouth every 30 (thirty) days.   Yes Historical Provider, MD   No Known Allergies  FAMILY HISTORY: SOCIAL HISTORY:REVIEW OF SYSTEMS:  Cannot obtain due to intubation  SUBJECTIVE:   VITAL SIGNS: Temp:  [101.2 F (38.4 C)-103.1 F (39.5 C)] 101.2 F (38.4 C) (10/23 2214) Pulse Rate:  [68-78] 68 (10/24 0343) Resp:  [14-26] 14 (10/24 0343) BP: (123-127)/(52-75) 123/52 mmHg (10/23 2214) SpO2:  [93 %-100 %] 100 % (10/24 0343) FiO2 (%):  [50 %] 50 % (10/24 0343) Weight:  [65.772 kg (145 lb)] 65.772 kg (145 lb) (10/23 2015) HEMODYNAMICS:   VENTILATOR SETTINGS: Vent Mode:  [-] PRVC FiO2 (%):  [50 %] 50  % Set Rate:  [14 bmp] 14 bmp Vt Set:  [520 mL] 520 mL PEEP:  [5 cmH20] 5 cmH20 INTAKE / OUTPUT: Intake/Output     10/23 0701 - 10/24 0700   I.V. (mL/kg) 1900 (28.9)   IV Piggyback 500   Total Intake(mL/kg) 2400 (36.5)   Urine (mL/kg/hr) 250   Blood 100   Total Output 350   Net +2050         PHYSICAL EXAMINATION:  Gen: sedated on vent HEENT: NCAT, ETT in place PULM: Few scattered rhonchi bilaterally CV: RRR, no mgr, + JVD AB: BS absent, distended, wound dressing c/d/i Ext: warm, no edema, no clubbing, no cyanosis Derm: no rash or skin breakdown Neuro: sedated on vent, arouses to voice   LABS:  CBC Recent Labs     09/25/13  2030  WBC  17.9*  HGB  9.8*  HCT  30.3*  PLT  223   Coag's No results found for this basename: APTT, INR,  in the last 72 hours BMET Recent Labs     09/25/13  2030  NA  136  K  3.9  CL  102  CO2  28  BUN  19  CREATININE  1.01  GLUCOSE  128*   Electrolytes Recent Labs     09/25/13  2030  CALCIUM  8.7   Sepsis Markers No results found for this basename: LACTICACIDVEN, PROCALCITON, O2SATVEN,  in the last 72 hours ABG No results found for this basename: PHART, PCO2ART, PO2ART,  in the last 72 hours Liver Enzymes Recent Labs     09/25/13  2030  AST  34  ALT  27  ALKPHOS  109  BILITOT  0.3  ALBUMIN  2.9*   Cardiac Enzymes No results found for this basename: TROPONINI, PROBNP,  in the last 72 hours Glucose No results found for this basename: GLUCAP,  in the last 72 hours  Imaging   CXR: pending EKG: NSR, non-specific ST wave changes  ASSESSMENT / PLAN:  GASTROINTESTINAL A:  Sigmoid colon perforation from chicken bone, s/p partial colectomy and colostomy P:   -post op care per surgery -NG to suction -Pantoprazole for stress ulcer prophylaxis  PULMONARY A: Post op respiratory failure COPD, not in exacerbation P:   -full vent support -abg now -cxr now -SBT/WUA later in AM 10/24 -prn albuterol on vent, change  to nebs post extubation  CARDIOVASCULAR A: CAD Hypertension P:  -continue metoprolol IV -add back home dilt when taking PO -tele  RENAL A:  No acute issues P:   -monitor UOP -BMET now  HEMATOLOGIC A:  Anemia pre-op P:  -check CBC -transfusion threshold Hgb < 7  INFECTIOUS A:  Peritonitis P:   -zosyn per pharmacy -f/u wound culture  ENDOCRINE A:  No acute issues P:   -monitor glucose with  labs  NEUROLOGIC A:  Post op sedation needs for vent synchrony P:   -fentanyl gtt -add back dilaudid post extubation  TODAY'S SUMMARY:   I have personally obtained a history, examined the patient, evaluated laboratory and imaging results, formulated the assessment and plan and placed orders. CRITICAL CARE: The patient is critically ill with multiple organ systems failure and requires high complexity decision making for assessment and support, frequent evaluation and titration of therapies, application of advanced monitoring technologies and extensive interpretation of multiple databases. Critical Care Time devoted to patient care services described in this note is 40  minutes.   Fonnie Jarvis Pulmonary and Critical Care Medicine [facility] Pager: [phone]  09/26/2013, 4:12 AM

## 2013-09-26 NOTE — Op Note (Signed)
09/25/2013 - 09/26/2013  3:43 AM  PATIENT:  Zachary [name]  72 y.o. male  PRE-OPERATIVE DIAGNOSIS:  Bowel perforation   POST-OPERATIVE DIAGNOSIS:  Sigmoid colon perforation from chicken bone  PROCEDURE:  Procedure(s): Exploratory laparotomy Lysis of adhesions for 75 minutes Partial sigmoid colectomy Colostomy   SURGEON:  Surgeon(s): Liz Malady, MD  PHYSICIAN ASSISTANT:   ASSISTANTS: none   ANESTHESIA:   general  EBL:  Total I/O In: 1500 [I.V.:1000; IV Piggyback:500] Out: 350 [Urine:250; Blood:100]  BLOOD ADMINISTERED:none  DRAINS: (1) Jackson-Pratt drain(s) with closed bulb suction in the pelvis   SPECIMEN:  Excision  DISPOSITION OF SPECIMEN:  PATHOLOGY  COUNTS:  YES  DICTATION: .Dragon Dictation Patient presented with 3 days of abdominal pain to the emergency department. He was found to have free air & bowel perforation on CT. He is brought for emergent exploration. Informed consent was obtained from his sister by phone. He received intravenous antibiotics. He was brought to the operating room and general endotracheal anesthesia was administered by the anesthesia staff. His abdomen was prepped and draped in a sterile fashion after the nursing staff placed a Foley catheter. We did a time out procedure. Lower midline incision was made along his old scar. Subcutaneous tissues were dissected down and the perineal cavity was carefully entered under direct vision. He had a lot of previous scar tissue. The fascia below the umbilicus was gradually open. This involved lysing a lot of adhesions from previous surgery. Several fascial defects were encountered, some containing omentum and some containing bowel as we extended the fascial incision up past the umbilicus. Further extensive lysis of adhesions was done freeing up the abdomen. Total time adhesiolysis was 75 minutes. Once we had the omentum up out of the pelvis and the small bowel was brought up I was able to see a bone  sticking through the wall of the sigmoid colon. This was removed and sent to pathology. The sigmoid colon was then mobilized from its lateral peritoneal attachments. It was then divided with a contour stapler below the perforation. Mesentery was taken down between clamps and tied securely. It was divided again above the perforation With a contour stapler again and this specimen was sent to pathology. Sigmoid colon was further mobilized as well as the distal left colon to facilitate colostomy placement. Circular incision was made in the left lower quadrant and the fat was dissected down to the fascia. Cruciate incision was made in the fascia. Muscle was split and spread to easily pass 2 fingers. Proximal sigmoid colon stump was brought out through the wound. The colon was tacked up to the inside of the abdominal wall at the exit site with 3-0 silk. Abdomen was copiously irrigated with multiple liters of saline. 19 [place] Blake drain was placed in the pelvis. This was secured with nylon suture. Abdominal contents was returned to anatomic position. Fascia was closed with 2 lengths of running #1 looped PDS one from each end and tied in the middle. Along the closure I placed multiple interrupted #1 Novafil's as internal retentions. His midline wound was left opened and packed with sterile wet-to-dry dressing. Colostomy was matured with interrupted 3-0 Vicryl sutures. He remained hemodynamically stable but was kept on the ventilator and was taken directly to the surgical intensive care unit In critical but stable condition. I consulted the critical care medicine service to assist with his management.All counts were correct. There were no apparent complications.  PATIENT DISPOSITION:  ICU - intubated and hemodynamically stable.  Delay start of Pharmacological VTE agent (>24hrs) due to surgical blood loss or risk of bleeding:  no  Violeta Gelinas, MD, MPH, FACS Pager: [phone]  10/24/20143:43  AM
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
## 2013-09-26 NOTE — Anesthesia Procedure Notes (Signed)
Procedure Name: Intubation Date/Time: 09/26/2013 1:32 AM Performed by: Luster Landsberg Pre-anesthesia Checklist: Patient identified, Emergency Drugs available, Suction available and Patient being monitored Patient Re-evaluated:Patient Re-evaluated prior to inductionOxygen Delivery Method: Circle system utilized Preoxygenation: Pre-oxygenation with 100% oxygen Intubation Type: IV induction, Rapid sequence and Cricoid Pressure applied Laryngoscope Size: Mac and 3 Grade View: Grade II Tube type: Oral Tube size: 7.0 mm Number of attempts: 1 Airway Equipment and Method: Stylet Placement Confirmation: ETT inserted through vocal cords under direct vision,  positive ETCO2 and breath sounds checked- equal and bilateral Secured at: 23 cm Tube secured with: Tape Dental Injury: Teeth and Oropharynx as per pre-operative assessment

## 2013-09-26 NOTE — Procedures (Signed)
Extubation Procedure Note  Patient Details:   Name: Zachary [name] DOB: Apr 13, 1941 MRN: [id number]   Airway Documentation:    + air leak around cuff.    Evaluation  O2 sats: stable throughout Complications: No apparent complications Patient did tolerate procedure well. Bilateral Breath Sounds: Clear Suctioning: Oral Yes, pt able to speak.  [facility] clear, no stridor noted.  IS approx .    Jennette Kettle 09/26/2013, 9:39 AM

## 2013-09-26 NOTE — Anesthesia Preprocedure Evaluation (Addendum)
Anesthesia Evaluation  Patient identified by MRN, date of birth, ID band Patient awake    Reviewed: Allergy & Precautions, H&P , Patient's Chart, lab work & pertinent test results  Airway Mallampati: II TM Distance: >3 FB Neck ROM: limited    Dental  (+) Edentulous Lower and Edentulous Upper   Pulmonary COPDCurrent Smoker,          Cardiovascular hypertension, Pt. on medications and Pt. on home beta blockers + angina + CAD + dysrhythmias Atrial Fibrillation     Neuro/Psych PSYCHIATRIC DISORDERS Anxiety Depression    GI/Hepatic (+)     substance abuse  alcohol use, Hepatitis -, B and C  Endo/Other    Renal/GU      Musculoskeletal   Abdominal   Peds  Hematology   Anesthesia Other Findings   Reproductive/Obstetrics                         Anesthesia Physical Anesthesia Plan  ASA: IV and emergent  Anesthesia Plan: General   Post-op Pain Management:    Induction: Intravenous, Rapid sequence and Cricoid pressure planned  Airway Management Planned: Oral ETT  Additional Equipment:   Intra-op Plan:   Post-operative Plan: Post-operative intubation/ventilation  Informed Consent: I have reviewed the patients History and Physical, chart, labs and discussed the procedure including the risks, benefits and alternatives for the proposed anesthesia with the patient or authorized representative who has indicated his/her understanding and acceptance.   History available from chart only  Plan Discussed with:   Anesthesia Plan Comments:         Anesthesia Quick Evaluation

## 2013-09-26 NOTE — Progress Notes (Signed)
Dr Delton Coombes at bedside, okay to proceed w/ SBT. Pt is awake and follows commands.  Pt placed on 5 ps, 5 peep.  NO distress noted.

## 2013-09-26 NOTE — H&P (Signed)
Zachary [name] is an 72 y.o. male.   Chief Complaint: Abdominal pain HPI: Patient is known to our trauma service Status post admission for fall with multiple rib fractures and hemothorax. He was discharged in August of 2014 to a skilled nursing facility. He has been at the facility since then. He complains of three-day history of abdominal pain which started after he ate some nuts. The pain is worsened by eating. It comes and goes. In light of the symptoms, he was sent to the emergency department tonight for evaluation. He underwent CT scan of the abdomen and pelvis which demonstrates free intraperitoneal air and perforation of bowel in the pelvis, possibly perforated diverticulitis. He continues to have pain.  Past Medical History  Diagnosis Date  . Hypertension   . Coronary artery disease   . Hyperlipemia   . Angina   . Shortness of breath   . Mental disorder   . Arthritis   . COPD (chronic obstructive pulmonary disease)   . Hiatal hernia   . Depression   . Gout   . Joint pain   . Depression   . Anxiety     Past Surgical History  Procedure Laterality Date  . Abdominal surgery      History reviewed. No pertinent family history. Social History:  reports that he has been smoking Cigarettes.  He has a 15 pack-year smoking history. He has quit using smokeless tobacco. He reports that he drinks about 7.2 ounces of alcohol per week. He reports that he does not use illicit drugs.  Allergies: No Known Allergies   (Not in a hospital admission)  Results for orders placed during the hospital encounter of 09/25/13 (from the past 48 hour(s))  COMPREHENSIVE METABOLIC PANEL     Status: Abnormal   Collection Time    09/25/13  8:30 PM      Result Value Range   Sodium 136  135 - 145 mEq/L   Potassium 3.9  3.5 - 5.1 mEq/L   Chloride 102  96 - 112 mEq/L   CO2 28  19 - 32 mEq/L   Glucose, Bld 128 (*) 70 - 99 mg/dL   BUN 19  6 - 23 mg/dL   Creatinine, Ser 8.41  0.50 - 1.35 mg/dL   Calcium  8.7  8.4 - 32.4 mg/dL   Total Protein 6.8  6.0 - 8.3 g/dL   Albumin 2.9 (*) 3.5 - 5.2 g/dL   AST 34  0 - 37 U/L   ALT 27  0 - 53 U/L   Alkaline Phosphatase 109  39 - 117 U/L   Total Bilirubin 0.3  0.3 - 1.2 mg/dL   GFR calc non Af Amer 73 (*) >90 mL/min   GFR calc Af Amer 85 (*) >90 mL/min   Comment: (NOTE)     The eGFR has been calculated using the CKD EPI equation.     This calculation has not been validated in all clinical situations.     eGFR's persistently <90 mL/min signify possible Chronic Kidney     Disease.  CBC WITH DIFFERENTIAL     Status: Abnormal   Collection Time    09/25/13  8:30 PM      Result Value Range   WBC 17.9 (*) 4.0 - 10.5 K/uL   RBC 3.53 (*) 4.22 - 5.81 MIL/uL   Hemoglobin 9.8 (*) 13.0 - 17.0 g/dL   HCT 40.1 (*) 02.7 - 25.3 %   MCV 85.8  78.0 - 100.0 fL  MCH 27.8  26.0 - 34.0 pg   MCHC 32.3  30.0 - 36.0 g/dL   RDW 40.9  81.1 - 91.4 %   Platelets 223  150 - 400 K/uL   Neutrophils Relative % 88 (*) 43 - 77 %   Neutro Abs 15.7 (*) 1.7 - 7.7 K/uL   Lymphocytes Relative 6 (*) 12 - 46 %   Lymphs Abs 1.2  0.7 - 4.0 K/uL   Monocytes Relative 6  3 - 12 %   Monocytes Absolute 1.1 (*) 0.1 - 1.0 K/uL   Eosinophils Relative 0  0 - 5 %   Eosinophils Absolute 0.0  0.0 - 0.7 K/uL   Basophils Relative 0  0 - 1 %   Basophils Absolute 0.0  0.0 - 0.1 K/uL  LIPASE, BLOOD     Status: None   Collection Time    09/25/13  8:30 PM      Result Value Range   Lipase 32  11 - 59 U/L  CG4 I-STAT (LACTIC ACID)     Status: None   Collection Time    09/25/13  8:58 PM      Result Value Range   Lactic Acid, Venous 2.16  0.5 - 2.2 mmol/L  URINALYSIS, ROUTINE W REFLEX MICROSCOPIC     Status: Abnormal   Collection Time    09/25/13  9:21 PM      Result Value Range   Color, Urine YELLOW  YELLOW   APPearance CLEAR  CLEAR   Specific Gravity, Urine 1.019  1.005 - 1.030   pH 6.5  5.0 - 8.0   Glucose, UA NEGATIVE  NEGATIVE mg/dL   Hgb urine dipstick NEGATIVE  NEGATIVE   Bilirubin  Urine NEGATIVE  NEGATIVE   Ketones, ur NEGATIVE  NEGATIVE mg/dL   Protein, ur 30 (*) NEGATIVE mg/dL   Urobilinogen, UA 2.0 (*) 0.0 - 1.0 mg/dL   Nitrite NEGATIVE  NEGATIVE   Leukocytes, UA NEGATIVE  NEGATIVE  URINE MICROSCOPIC-ADD ON     Status: None   Collection Time    09/25/13  9:21 PM      Result Value Range   WBC, UA 0-2  <3 WBC/hpf   RBC / HPF 3-6  <3 RBC/hpf   Bacteria, UA RARE  RARE   Urine-Other MUCOUS PRESENT     Ct Abdomen Pelvis W Contrast  09/25/2013   CLINICAL DATA:  Diffuse abdominal pain.  EXAM: CT ABDOMEN AND PELVIS WITH CONTRAST  TECHNIQUE: Multidetector CT imaging of the abdomen and pelvis was performed using the standard protocol following bolus administration of intravenous contrast.  CONTRAST:  OMNIPAQUE IOHEXOL 300 MG/ML  SOLN  COMPARISON:  06/30/2013  FINDINGS: Lung bases demonstrate interval resolution or evacuation of the previously demonstrated hemoperitoneum with residual scarring or atelectasis in both lung bases. Small residual left pleural effusion. Mild cardiac enlargement. Old right rib fractures.  Since the previous study, there is interval development of free intra-abdominal air and of subcutaneous emphysema in the anterior abdominal wall. There is an abnormal thick-walled bowel loop in the pelvis with free air in the adjacent mesentery and mesenteric infiltration. The no contrast extravasation is noted. Findings are worrisome for bowel necrosis and perforation. Small mesenteric abscess measuring about 2.3 cm diameter. No portal venous gas or pneumatosis.  Heterogeneous low-attenuation lesion in the posterior segment right lobe of the liver measures approximately 2.7 cm diameter. This is not have typical features of a cavernous hemangioma, although there is fill-in on the delayed imaging.  The lesion has a cystic appearance on the previous study. This could represent atypical hemangioma possibly hemorrhage within a cyst. Metastasis is not entirely excluded if  the patient has history of primary cancer. No other focal liver lesions are demonstrated. Cholelithiasis without gallbladder wall thickening. No bile duct dilatation. Pancreas, spleen, adrenal glands, kidneys, inferior vena cava, and retroperitoneal lymph nodes are unremarkable. Calcification of the abdominal aorta without aneurysm. Minimal calcification of the origin of the superior mesenteric artery. No free fluid in the pelvis as that no free fluid in the abdomen. Multiple anterior abdominal wall hernias containing fat and transverse colon. No obstruction is suggested. The stomach is decompressed. Stool-filled colon without distention.  Pelvis: There is a moderate size right inguinal hernia containing fat. Gas collections are present in the hernia which may be arising from the abdomen although infectious process is not excluded. The appendix is normal in extends partially into the hernia. Diffuse bladder wall thickening suggesting cystitis or hypertrophy. Colonic diverticula without. The the abscess and abnormal small bowel loop are adjacent to the sigmoid colon but there appears to be intervening fat. A perforated colonic diverticulitis with reactive inflammation of the small bowel loop could also potentially have this appearance. No significant pelvic lymphadenopathy. Degenerative changes in the lumbar spine. No destructive bone lesions are appreciated.  IMPRESSION: Free intra-abdominal air. Thick-walled bowel loop in the pelvis with adjacent abscess and gas in the mesentery. The findings are worrisome for bowel ischemia with perforation although inflammatory process such as perforated diverticulitis could also have this appearance. Multiple anterior abdominal wall hernias are similar to previous study. Indeterminate lesion in the right lobe of the liver. Cholelithiasis.  Results were discussed by telephone with Dr. Bebe Shaggy at 2332 hr on 09/25/2013.   Electronically Signed   By: Burman Nieves M.D.   On:  09/25/2013 23:38   Dg Chest Portable 1 View  09/25/2013   CLINICAL DATA:  Fever, smoker  EXAM: PORTABLE CHEST - 1 VIEW  COMPARISON:  07/13/2013  FINDINGS: Normal heart size and vascularity. Monitor leads overlie the chest. Minor basilar atelectasis versus scarring. Negative for CHF or pneumonia. No large effusion or pneumothorax. Left costophrenic angle is incompletely visualized. Trachea is midline. Overall stable exam.  IMPRESSION: No acute chest process. Stable exam.   Electronically Signed   By: Ruel Favors M.D.   On: 09/25/2013 21:11    Review of Systems  Unable to perform ROS: mental acuity    Blood pressure 123/52, pulse 78, temperature 101.2 F (38.4 C), temperature source Oral, resp. rate 26, height 5' 6.5" (1.689 m), weight 65.772 kg (145 lb), SpO2 93.00%. Physical Exam  Constitutional: No distress.  HENT:  Head: Normocephalic and atraumatic.  Eyes: EOM are normal. Pupils are equal, round, and reactive to light.  Neck: Normal range of motion. Neck supple. No tracheal deviation present.  Cardiovascular: Normal rate, regular rhythm, normal heart sounds and intact distal pulses.   Respiratory: Effort normal and breath sounds normal. No respiratory distress. He has no wheezes. He has no rales.  GI: He exhibits distension. There is tenderness. There is guarding. There is no rebound.  Multiple midline incisional hernias, tenderness bilateral lower quadrants with guarding  Musculoskeletal: Normal range of motion.  Neurological:  Alert, follows commands, memory deficits  Skin: Skin is warm.     Assessment/Plan Bowel perforation with free intraperitoneal air. I recommend IV antibiotics and exploratory laparotomy with possible bowel resection and possible ostomy. During the patient's last admission, he was declared incompetent.  I spoke with his sister, Lorina Rabon on the phone. We discussed the risks and benefits of surgery and that he is critically ill. This is a life-threatening  condition. She consented to the surgery and we documented telephone consent. We'll proceed emergently to the operating room.  Vidal Lampkins E 09/26/2013, 12:18 AM

## 2013-09-26 NOTE — Transfer of Care (Signed)
Immediate Anesthesia Transfer of Care Note  Patient: Zachary [name]  Procedure(s) Performed: Procedure(s): EXPLORATORY LAPAROTOMY (N/A)  Patient Location: PACU  Anesthesia Type:General  Level of Consciousness: Patient remains intubated per anesthesia plan  Airway & Oxygen Therapy: Patient remains intubated per anesthesia plan and Patient placed on Ventilator (see vital sign flow sheet for setting)  Post-op Assessment: Report given to PACU RN and Post -op Vital signs reviewed and stable  Post vital signs: Reviewed and stable  Complications: No apparent anesthesia complications

## 2013-09-26 NOTE — Significant Event (Signed)
0715am-night RN saw that patient was reaching to pull ETT and NG after being educated on importance on keeping these in placed and intact. Patient did not comprehend at the time and continued to reach for ETT and NG. Bilateral soft wrist restraints applied. Will continue to monitor closely. Ruchel Brandenburger, Charity fundraiser.

## 2013-09-26 NOTE — Progress Notes (Signed)
PULMONARY  / CRITICAL CARE MEDICINE  Name: Zachary [name] MRN: [id number] DOB: [birth year]    ADMISSION DATE:  09/25/2013 CONSULTATION DATE:  09/25/2013  REFERRING MD :  [name] PRIMARY SERVICE: [facility]  CHIEF COMPLAINT:  Post op respiratory failure  BRIEF PATIENT DESCRIPTION: 72 y/o male underwent an ex-lap, partial sigmoid colectomy and colostomy on 10/24 early in the AM for sigmoid colon rupture and PCCM was consulted for post op vent management.  SIGNIFICANT EVENTS / STUDIES:  10/23 CT abdomen > free intra-abdominal air, thick-walled bowel loop in pelvis with adjacent abscess and gas in the mesentery, worrisome for perforation 10/24 exlap, lysis of adhesions, partial sigmoid colectomy, colostomy  LINES / TUBES: 10/24 ETT >> 10/24  CULTURES: 10/24 body fluid culture > 10/24 urine culture >   ANTIBIOTICS: 10/24 zosyn >   HISTORY OF PRESENT ILLNESS:  73 y/o male underwent an ex-lap, partial sigmoid colectomy and colostomy on 10/24 early in the AM for sigmoid colon rupture and PCCM was consulted for post op vent management.  The patient was intubated when I arrived so history was obtained by chart review.  He complained of three days of abdominal pain in the ED on 10/23.  He was found to have free air so he went for emergent surgery early in the morning of 10/24.  SUBJECTIVE:  Denies abd pain,  Wants ETT out  VITAL SIGNS: Temp:  [98.2 F (36.8 C)-103.1 F (39.5 C)] 98.2 F (36.8 C) (10/24 0740) Pulse Rate:  [56-83] 76 (10/24 0900) Resp:  [14-26] 18 (10/24 0900) BP: (116-160)/(47-119) 149/55 mmHg (10/24 0900) SpO2:  [93 %-100 %] 97 % (10/24 0900) FiO2 (%):  [40 %-50 %] 40 % (10/24 0900) Weight:  [65.772 kg (145 lb)-70.8 kg (156 lb 1.4 oz)] 70.8 kg (156 lb 1.4 oz) (10/24 0415) HEMODYNAMICS:   VENTILATOR SETTINGS: Vent Mode:  [-] CPAP;PSV FiO2 (%):  [40 %-50 %] 40 % Set Rate:  [14 bmp-18 bmp] 18 bmp Vt Set:  [520 mL] 520 mL PEEP:  [5 cmH20] 5 cmH20 Pressure Support:  [5  cmH20] 5 cmH20 INTAKE / OUTPUT: Intake/Output     10/23 0701 - 10/24 0700 10/24 0701 - 10/25 0700   I.V. (mL/kg) 2214.1 (31.3) 216.4 (3.1)   IV Piggyback 500    Total Intake(mL/kg) 2714.1 (38.3) 216.4 (3.1)   Urine (mL/kg/hr) 490 90 (0.5)   Drains 122    Blood 100    Total Output 712 90   Net +2002.1 +126.4          PHYSICAL EXAMINATION:  Gen: sedated on vent HEENT: NCAT, ETT in place PULM: Few scattered rhonchi bilaterally CV: RRR, no mgr, + JVD AB: BS absent, distended, wound dressing c/d/i Ext: warm, no edema, no clubbing, no cyanosis Derm: no rash or skin breakdown Neuro: sedated on vent, arouses to voice   LABS:  CBC Recent Labs     09/25/13  2030  WBC  17.9*  HGB  9.8*  HCT  30.3*  PLT  223   Coag's No results found for this basename: APTT, INR,  in the last 72 hours BMET Recent Labs     09/25/13  2030  NA  136  K  3.9  CL  102  CO2  28  BUN  19  CREATININE  1.01  GLUCOSE  128*   Electrolytes Recent Labs     09/25/13  2030  CALCIUM  8.7   Sepsis Markers No results found for this basename: LACTICACIDVEN, PROCALCITON, O2SATVEN,  in the last 72 hours ABG Recent Labs     09/26/13  0455  PHART  7.277*  PCO2ART  53.8*  PO2ART  108.0*   Liver Enzymes Recent Labs     09/25/13  2030  AST  34  ALT  27  ALKPHOS  109  BILITOT  0.3  ALBUMIN  2.9*   Cardiac Enzymes No results found for this basename: TROPONINI, PROBNP,  in the last 72 hours Glucose Recent Labs     09/26/13  0409  GLUCAP  121*    Imaging   CXR: 10/24 > no infiltrates EKG: NSR, non-specific ST wave changes  ASSESSMENT / PLAN:  GASTROINTESTINAL A:  Sigmoid colon perforation from chicken bone, s/p partial colectomy and colostomy P:   -post op care per surgery -NG to suction -Pantoprazole for stress ulcer prophylaxis  PULMONARY A: Post op respiratory failure COPD, not in exacerbation P:   -SBT and assess for extubation now, expect that he will be able to be  extubated -BD's via neb until he is stronger  CARDIOVASCULAR A: CAD Hypertension P:  -continue metoprolol IV -add back home dilt when taking PO -tele  RENAL A:  No acute issues P:   -monitor UOP -BMET now  HEMATOLOGIC A:  Anemia pre-op P:  -check CBC -transfusion threshold Hgb < 7  INFECTIOUS A:  Peritonitis P:   -zosyn per pharmacy -f/u wound culture, ostomy care  ENDOCRINE A:  No acute issues P:   -monitor glucose with labs  NEUROLOGIC A:  Post op sedation needs for vent synchrony P:   -transition to intermittent narcotics after extubation.   TODAY'S SUMMARY:   I have personally obtained a history, examined the patient, evaluated laboratory and imaging results, formulated the assessment and plan and placed orders.  CRITICAL CARE: The patient is critically ill with multiple organ systems failure and requires high complexity decision making for assessment and support, frequent evaluation and titration of therapies, application of advanced monitoring technologies and extensive interpretation of multiple databases. Critical Care Time devoted to patient care services described in this note is 40  minutes.    Levy Pupa, MD, PhD 09/26/2013, 9:34 AM [facility] [phone] or if no answer [phone]

## 2013-09-26 NOTE — Progress Notes (Signed)
Patient interviewed and examined, agree with PA note above. Doing well and abdomen is benign  Mariella Saa MD, FACS  09/26/2013 6:24 PM

## 2013-09-26 NOTE — Significant Event (Signed)
1015am-sedation drips wasted in sink and flushed-wasted approximately 200cc of fentanyl and approximately 55cc of propofol with Alexia Freestone, RN.

## 2013-09-27 DIAGNOSIS — J95821 Acute postprocedural respiratory failure: Secondary | ICD-10-CM

## 2013-09-27 DIAGNOSIS — J449 Chronic obstructive pulmonary disease, unspecified: Secondary | ICD-10-CM

## 2013-09-27 LAB — BASIC METABOLIC PANEL
BUN: 10 mg/dL (ref 6–23)
Creatinine, Ser: 0.88 mg/dL (ref 0.50–1.35)
GFR calc Af Amer: >90 mL/min (ref >90)
GFR calc non Af Amer: 85 mL/min — ABNORMAL LOW (ref >90)
Sodium: 135 mEq/L (ref 135–145)

## 2013-09-27 LAB — CBC
HCT: 28.1 % — ABNORMAL LOW (ref 39.0–52.0)
Hemoglobin: 8.9 g/dL — ABNORMAL LOW (ref 13.0–17.0)
MCH: 27.3 pg (ref 26.0–34.0)
MCHC: 31.7 g/dL (ref 30.0–36.0)
MCV: 86.2 fL (ref 78.0–100.0)
RDW: 14.6 % (ref 11.5–15.5)

## 2013-09-27 LAB — URINE CULTURE

## 2013-09-27 MED ORDER — ALBUTEROL SULFATE (5 MG/ML) 0.5% IN NEBU: 2.5000 mg | INHALATION_SOLUTION | RESPIRATORY_TRACT | Status: DC | PRN

## 2013-09-27 MED ORDER — ENOXAPARIN SODIUM 40 MG/0.4ML ~~LOC~~ SOLN
40.0000 mg | SUBCUTANEOUS | Status: DC
  Administered 2013-09-27 – 2013-10-01 (×5): 40 mg via SUBCUTANEOUS
  Filled 2013-09-27 (×6): qty 0.4

## 2013-09-27 MED ORDER — FENTANYL CITRATE 0.05 MG/ML IJ SOLN
25.0000 ug | INTRAMUSCULAR | Status: DC | PRN
  Administered 2013-09-27 – 2013-10-01 (×18): 100 ug via INTRAVENOUS
  Administered 2013-10-02 – 2013-10-05 (×7): 50 ug via INTRAVENOUS
  Filled 2013-09-27 (×26): qty 2

## 2013-09-27 MED ORDER — IPRATROPIUM BROMIDE 0.02 % IN SOLN
0.5000 mg | Freq: Four times a day (QID) | RESPIRATORY_TRACT | Status: DC
  Administered 2013-09-27 – 2013-09-30 (×14): 0.5 mg via RESPIRATORY_TRACT
  Filled 2013-09-27 (×14): qty 2.5

## 2013-09-27 MED ORDER — ALBUTEROL SULFATE (5 MG/ML) 0.5% IN NEBU
2.5000 mg | INHALATION_SOLUTION | Freq: Four times a day (QID) | RESPIRATORY_TRACT | Status: DC
  Administered 2013-09-27 – 2013-09-30 (×14): 2.5 mg via RESPIRATORY_TRACT
  Filled 2013-09-27 (×14): qty 0.5

## 2013-09-27 NOTE — Evaluation (Addendum)
Physical Therapy Evaluation Patient Details Name: Zachary [name] MRN: [id number] DOB: November 22, 1941 Today's Date: 09/27/2013 Time: 7829-5621 PT Time Calculation (min): 25 min  PT Assessment / Plan / Recommendation History of Present Illness  Adm for abd pain and found to have perforated bowel. Underwent emergency partial colectomy and colostomy  Clinical Impression  Patient is s/p above surgery resulting in functional limitations due to the deficits listed below (see PT Problem List). Pt with slight decline in function due to abdominal surgery and requires instruction in how to protect his abdomen with bed mobility. Patient will benefit from skilled PT to increase their independence and safety with mobility. Per pt, he is unsure of his plans at d/c. He does not want to return to SNF, however he does not have anyone else to stay with.    PT Assessment  Patient needs continued PT services    Follow Up Recommendations  SNF vs No PT follow up (anticipate return to baseline quickly);Supervision - Intermittent    Does the patient have the potential to tolerate intense rehabilitation      Barriers to Discharge Decreased caregiver support      Equipment Recommendations  None recommended by PT    Recommendations for Other Services     Frequency Min 2X/week    Precautions / Restrictions Precautions Precautions: Fall   Pertinent Vitals/Pain Reported abd pain with bed mobility (not rated) SaO2 97% on 4L [place] O2 (reports he used O2 at baseline at SNF)      Mobility  Bed Mobility Bed Mobility: Rolling Left;Left Sidelying to Sit;Sitting - Scoot to [place] of Bed Rolling Left: 4: Min assist Left Sidelying to Sit: 4: Min assist;HOB elevated Sitting - Scoot to [place] of Bed: 5: Supervision Details for Bed Mobility Assistance: pt attempting to move from supine to sit and encouraged to move through sidelying to prevent excessive strain on his abdomen/surgical site Transfers Transfers: Sit to  Stand;Stand to Sit Sit to Stand: 5: Supervision Stand to Sit: 5: Supervision Details for Transfer Assistance: required no assist; no unsteadiness Ambulation/Gait Ambulation/Gait Assistance: 5: Supervision Ambulation Distance (Feet): 300 Feet Assistive device: Other (Comment) (pushing w/c) Ambulation/Gait Assistance Details: pt preferred to push w/c with O2 tank attached vs using RW. Pt able to maneuver/steer w/c well throughout hall and obstacles. no unsteadiness noted Gait Pattern: Step-through pattern;Decreased stride length;Trunk flexed    Exercises     PT Diagnosis: Difficulty walking;Acute pain  PT Problem List: Decreased mobility;Decreased knowledge of use of DME;Decreased knowledge of precautions;Pain PT Treatment Interventions: DME instruction;Gait training;Functional mobility training;Therapeutic activities;Patient/family education     PT Goals(Current goals can be found in the care plan section) Acute Rehab PT Goals Patient Stated Goal: find a place to live (other than SNF) PT Goal Formulation: With patient Time For Goal Achievement: 10/03/13 Potential to Achieve Goals: Good  Visit Information  Last PT Received On: 09/27/13 Assistance Needed: +1 History of Present Illness: Adm for abd pain and found to have perforated bowel. Underwent emergency partial colectomy and colostomy       Prior Functioning  Home Living Family/patient expects to be discharged to:: Skilled nursing facility Additional Comments: pt went to SNF after fall 07/2013 with rib fx's and hemothorax. Pt reports he recently finished his therapies, yet had no planned d/c date due to the fact that he had no where to go live. Reports he was living with friends prior to Aug 2014, but they are not committing to him returning to their home. Prior Function Level  of Independence: Independent with assistive device(s) Comments: using RW at SNF Communication Communication: No difficulties    Cognition   Cognition Arousal/Alertness: Awake/alert Behavior During Therapy: WFL for tasks assessed/performed Overall Cognitive Status: No family/caregiver present to determine baseline cognitive functioning (disoriented to time; otherwise oriented)    Extremity/Trunk Assessment Upper Extremity Assessment Upper Extremity Assessment: Overall WFL for tasks assessed Lower Extremity Assessment Lower Extremity Assessment: Overall WFL for tasks assessed   Balance Balance Balance Assessed: Yes Static Sitting Balance Static Sitting - Balance Support: No upper extremity supported;Feet supported Static Sitting - Level of Assistance: 7: Independent Static Standing Balance Static Standing - Balance Support: No upper extremity supported Static Standing - Level of Assistance: 5: Stand by assistance  End of Session PT - End of Session Equipment Utilized During Treatment: Oxygen Activity Tolerance: Patient tolerated treatment well Patient left: in chair;with call bell/phone within reach Nurse Communication: Mobility status  GP     Vanetta Rule 09/27/2013, 5:14 PM Pager [phone]

## 2013-09-27 NOTE — Progress Notes (Signed)
Agree with above, can go to stepdown

## 2013-09-27 NOTE — Progress Notes (Signed)
1 Day Post-Op  Subjective: Up in chair, alert and cooperative.  He sits up well and seems comfortable. Nothing from NG. No flatus  Objective: Vital signs in last 24 hours: Temp:  [97.1 F (36.2 C)-98.9 F (37.2 C)] 98.6 F (37 C) (10/25 0801) Pulse Rate:  [58-86] 79 (10/25 0800) Resp:  [12-29] 18 (10/25 0800) BP: (100-163)/(57-87) 158/67 mmHg (10/25 0800) SpO2:  [86 %-99 %] 96 % (10/25 0802)  npo  Nothing recorded from NG 172 FROM DRAIN 10 cc from colostomy Afebrile, VSS WBC is up but better.  H/H is stable HR in the 70's currently Intake/Output from previous day: 10/24 0701 - 10/25 0700 In: 2426.4 [I.V.:2216.4; NG/GT:60; IV Piggyback:150] Out: 1422.5 [Urine:1240; Drains:172.5; Stool:10] Intake/Output this shift: Total I/O In: -  Out: 143 [Urine:125; Drains:18]  General appearance: alert, cooperative and no distress Resp: some wheezing some rales at bases GI: no bowel sounds, open wound looks good. nothing in the ostomy bag so far.   Lab Results:   Recent Labs  09/26/13 0952 09/27/13 0424  WBC 19.3* 14.9*  HGB 9.2* 8.9*  HCT 28.3* 28.1*  PLT 199 211    BMET  Recent Labs  09/26/13 0952 09/27/13 0424  NA 137 135  K 3.8 4.3  CL 103 100  CO2 26 26  GLUCOSE 132* 96  BUN 14 10  CREATININE 0.94 0.88  CALCIUM 8.5 8.6   PT/INR No results found for this basename: LABPROT, INR,  in the last 72 hours   Recent Labs Lab 09/25/13 2030  AST 34  ALT 27  ALKPHOS 109  BILITOT 0.3  PROT 6.8  ALBUMIN 2.9*     Lipase     Component Value Date/Time   LIPASE 32 09/25/2013 2030     Studies/Results: Ct Abdomen Pelvis W Contrast  09/25/2013   CLINICAL DATA:  Diffuse abdominal pain.  EXAM: CT ABDOMEN AND PELVIS WITH CONTRAST  TECHNIQUE: Multidetector CT imaging of the abdomen and pelvis was performed using the standard protocol following bolus administration of intravenous contrast.  CONTRAST:  OMNIPAQUE IOHEXOL 300 MG/ML  SOLN  COMPARISON:  06/30/2013   FINDINGS: Lung bases demonstrate interval resolution or evacuation of the previously demonstrated hemoperitoneum with residual scarring or atelectasis in both lung bases. Small residual left pleural effusion. Mild cardiac enlargement. Old right rib fractures.  Since the previous study, there is interval development of free intra-abdominal air and of subcutaneous emphysema in the anterior abdominal wall. There is an abnormal thick-walled bowel loop in the pelvis with free air in the adjacent mesentery and mesenteric infiltration. The no contrast extravasation is noted. Findings are worrisome for bowel necrosis and perforation. Small mesenteric abscess measuring about 2.3 cm diameter. No portal venous gas or pneumatosis.  Heterogeneous low-attenuation lesion in the posterior segment right lobe of the liver measures approximately 2.7 cm diameter. This is not have typical features of a cavernous hemangioma, although there is fill-in on the delayed imaging. The lesion has a cystic appearance on the previous study. This could represent atypical hemangioma possibly hemorrhage within a cyst. Metastasis is not entirely excluded if the patient has history of primary cancer. No other focal liver lesions are demonstrated. Cholelithiasis without gallbladder wall thickening. No bile duct dilatation. Pancreas, spleen, adrenal glands, kidneys, inferior vena cava, and retroperitoneal lymph nodes are unremarkable. Calcification of the abdominal aorta without aneurysm. Minimal calcification of the origin of the superior mesenteric artery. No free fluid in the pelvis as that no free fluid in the abdomen.  Multiple anterior abdominal wall hernias containing fat and transverse colon. No obstruction is suggested. The stomach is decompressed. Stool-filled colon without distention.  Pelvis: There is a moderate size right inguinal hernia containing fat. Gas collections are present in the hernia which may be arising from the abdomen although  infectious process is not excluded. The appendix is normal in extends partially into the hernia. Diffuse bladder wall thickening suggesting cystitis or hypertrophy. Colonic diverticula without. The the abscess and abnormal small bowel loop are adjacent to the sigmoid colon but there appears to be intervening fat. A perforated colonic diverticulitis with reactive inflammation of the small bowel loop could also potentially have this appearance. No significant pelvic lymphadenopathy. Degenerative changes in the lumbar spine. No destructive bone lesions are appreciated.  IMPRESSION: Free intra-abdominal air. Thick-walled bowel loop in the pelvis with adjacent abscess and gas in the mesentery. The findings are worrisome for bowel ischemia with perforation although inflammatory process such as perforated diverticulitis could also have this appearance. Multiple anterior abdominal wall hernias are similar to previous study. Indeterminate lesion in the right lobe of the liver. Cholelithiasis.  Results were discussed by telephone with Dr. Bebe Shaggy at 2332 hr on 09/25/2013.   Electronically Signed   By: Burman Nieves M.D.   On: 09/25/2013 23:38   Dg Chest Port 1 View  09/26/2013   CLINICAL DATA:  Intubated patient.  Evaluate endotracheal tube.  EXAM: PORTABLE CHEST - 1 VIEW  COMPARISON:  09/25/2013  FINDINGS: Endotracheal tube is 5.3 cm above the carina. Nasogastric tube extends into the abdomen. Again noted are healing left rib fractures. Few densities at the lung bases are suggestive for atelectasis. Negative for airspace disease or edema. Heart size is normal.  IMPRESSION: Support apparatuses as described.  No focal chest disease.   Electronically Signed   By: Richarda Overlie M.D.   On: 09/26/2013 07:43   Dg Chest Portable 1 View  09/25/2013   CLINICAL DATA:  Fever, smoker  EXAM: PORTABLE CHEST - 1 VIEW  COMPARISON:  07/13/2013  FINDINGS: Normal heart size and vascularity. Monitor leads overlie the chest. Minor  basilar atelectasis versus scarring. Negative for CHF or pneumonia. No large effusion or pneumothorax. Left costophrenic angle is incompletely visualized. Trachea is midline. Overall stable exam.  IMPRESSION: No acute chest process. Stable exam.   Electronically Signed   By: Ruel Favors M.D.   On: 09/25/2013 21:11    Medications: . ipratropium  0.5 mg Nebulization Q6H WA   And  . albuterol  2.5 mg Nebulization Q6H WA  . chlorhexidine  15 mL Mouth/Throat BID  . heparin  5,000 Units Subcutaneous Q8H  . metoprolol  5 mg Intravenous Q6H  . pantoprazole (PROTONIX) IV  40 mg Intravenous Q24H  . piperacillin-tazobactam (ZOSYN)  IV  3.375 g Intravenous Q8H    Assessment/Plan Abdominal pain with Sigmoid colon perforation from chicken bone.  S/p Exploratory laparotomy, Lysis of adhesions for 75 minutes, Partial sigmoid colectomy Colostomy10/24/2014, Liz Malady, MD  S/p fall with multiple rib fx, hemothorax at SNF since 07/2013  CAD  Hypertension  COPD  Gout  Depression/Anxiety   Plan:  Mobilize more, IS, bowel rest, continue NG today.  I will check on transferring to floor. Start dressing changes to open wound.       LOS: 2 days    Tenishia Ekman 09/27/2013

## 2013-09-27 NOTE — Progress Notes (Signed)
PULMONARY  / CRITICAL CARE MEDICINE  Name: Zachary [name] MRN: [id number] DOB: [birth year]    ADMISSION DATE:  09/25/2013 CONSULTATION DATE:  09/25/2013  REFERRING MD :  [name] PRIMARY SERVICE: [facility]CS  CHIEF COMPLAINT:  Post op respiratory failure  BRIEF PATIENT DESCRIPTION: 72 y/o male underwent an ex-lap, partial sigmoid colectomy and colostomy on 10/24 early in the AM for sigmoid colon rupture and PCCM was consulted for post op vent management.  SIGNIFICANT EVENTS: 10/24 exlap, lysis of adhesions, partial sigmoid colectomy, colostomy  STUDIES:  10/23 CT abdomen > free intra-abdominal air, thick-walled bowel loop in pelvis with adjacent abscess and gas in the mesentery, worrisome for perforation  LINES / TUBES: 10/24 ETT >> 10/24  CULTURES: 10/24 body fluid culture > 10/24 urine culture >   ANTIBIOTICS: 10/24 zosyn >   SUBJECTIVE:  Breathing okay.  Denies chest pain, wheeze.  Still has abd pain, but better.  Gets itching from dilaudid.  VITAL SIGNS: Temp:  [97.1 F (36.2 C)-98.9 F (37.2 C)] 98.6 F (37 C) (10/25 0801) Pulse Rate:  [58-86] 86 (10/25 0600) Resp:  [12-29] 18 (10/25 0600) BP: (100-163)/(52-87) 136/87 mmHg (10/25 0600) SpO2:  [86 %-100 %] 96 % (10/25 0802) FiO2 (%):  [40 %] 40 % (10/24 0900) 2 liters [facility]  INTAKE / OUTPUT: Intake/Output     10/24 0701 - 10/25 0700 10/25 0701 - 10/26 0700   I.V. (mL/kg) 2216.4 (31.3)    NG/GT 60    IV Piggyback 150    Total Intake(mL/kg) 2426.4 (34.3)    Urine (mL/kg/hr) 1240 (0.7)    Drains 172.5 (0.1)    Stool 10 (0)    Blood     Total Output 1422.5     Net +1003.9            PHYSICAL EXAMINATION: Gen: No distress HEENT: no sinus tenderness PULM: no wheeze CV: regular AB: wound dressing clean, colostomy LLQ Ext: no edema Derm: no rashes Neuro: calm, alert, oriented, normal strength   LABS:  CBC Recent Labs     09/25/13  2030  09/26/13  0952  09/27/13  0424  WBC  17.9*  19.3*  14.9*  HGB   9.8*  9.2*  8.9*  HCT  30.3*  28.3*  28.1*  PLT  223  199  211   BMET Recent Labs     09/25/13  2030  09/26/13  0952  09/27/13  0424  NA  136  137  135  K  3.9  3.8  4.3  CL  102  103  100  CO2  28  26  26   BUN  19  14  10   CREATININE  1.01  0.94  0.88  GLUCOSE  128*  132*  96   Electrolytes Recent Labs     09/25/13  2030  09/26/13  0952  09/27/13  0424  CALCIUM  8.7  8.5  8.6    ASSESSMENT / PLAN:  GASTROINTESTINAL A:   Sigmoid colon perforation from chicken bone, s/p partial colectomy and colostomy. P:   -post op care per surgery -NG to suction -Pantoprazole for stress ulcer prophylaxis  PULMONARY A:  Post op respiratory failure >> successfully extubated 10/24. Hx of COPD. P:   -oxygen to keep SpO2 > 92% -f/u CXR as needed -scheduled BD's -bronchial hygiene  CARDIOVASCULAR A:  Hx of CAD, Hypertension. P:  -continue metoprolol IV -hold outpt diltazem, po lopressor until able to take po meds  HEMATOLOGIC A:  Anemia of critical illness. P:  -f/u CBC -transfusion threshold Hgb < 7 -SQ heparin for DVT prophylaxis  INFECTIOUS A:   Peritonitis. P:   -day 3 of zosyn  NEUROLOGIC A:   Pain control. Hx of ETOH, depression. P:   -change pain meds to fentanyl >> dilaudid causing pruritus -hold outpt neurontin, trazodone until able to take po meds  Okay to transfer to SDU >> defer to CCS.  PCCM will f/u again on 10/27, Monday >> call if help needed sooner.  Coralyn Helling, MD [facility] Pulmonary/Critical Care 09/27/2013, 8:37 AM Pager:  [phone] After 3pm call: [phone]

## 2013-09-28 LAB — BASIC METABOLIC PANEL
CO2: 28 mEq/L (ref 19–32)
Calcium: 9.2 mg/dL (ref 8.4–10.5)
GFR calc Af Amer: >90 mL/min (ref >90)
GFR calc non Af Amer: 81 mL/min — ABNORMAL LOW (ref >90)
Glucose, Bld: 102 mg/dL — ABNORMAL HIGH (ref 70–99)
Potassium: 4.5 mEq/L (ref 3.5–5.1)
Sodium: 136 mEq/L (ref 135–145)

## 2013-09-28 LAB — CBC
HCT: 28.1 % — ABNORMAL LOW (ref 39.0–52.0)
Hemoglobin: 9.1 g/dL — ABNORMAL LOW (ref 13.0–17.0)
MCH: 27.5 pg (ref 26.0–34.0)
RBC: 3.31 MIL/uL — ABNORMAL LOW (ref 4.22–5.81)

## 2013-09-28 LAB — BODY FLUID CULTURE

## 2013-09-28 MED ORDER — FUROSEMIDE 10 MG/ML IJ SOLN
10.0000 mg | Freq: Once | INTRAMUSCULAR | Status: AC
  Administered 2013-09-28: 10 mg via INTRAVENOUS

## 2013-09-28 MED ORDER — MUPIROCIN 2 % EX OINT
1.0000 "application " | TOPICAL_OINTMENT | Freq: Two times a day (BID) | CUTANEOUS | Status: DC
  Filled 2013-09-28: qty 22

## 2013-09-28 MED ORDER — METOPROLOL TARTRATE 1 MG/ML IV SOLN
5.0000 mg | INTRAVENOUS | Status: DC | PRN
  Administered 2013-09-28 (×2): 2.5 mg via INTRAVENOUS
  Administered 2013-09-29: 5 mg via INTRAVENOUS
  Filled 2013-09-28 (×3): qty 5

## 2013-09-28 MED ORDER — OXYCODONE HCL 5 MG/5ML PO SOLN
5.0000 mg | ORAL | Status: DC | PRN
  Administered 2013-09-28 – 2013-10-04 (×22): 10 mg via ORAL
  Administered 2013-10-05 (×2): 5 mg via ORAL
  Administered 2013-10-05 (×2): 10 mg via ORAL
  Administered 2013-10-05: 5 mg via ORAL
  Administered 2013-10-06: 10 mg via ORAL
  Administered 2013-10-06: 5 mg via ORAL
  Administered 2013-10-06: 10 mg via ORAL
  Filled 2013-09-28 (×13): qty 10
  Filled 2013-09-28 (×2): qty 5
  Filled 2013-09-28 (×16): qty 10

## 2013-09-28 MED ORDER — METOPROLOL TARTRATE 25 MG PO TABS: 25.0000 mg | ORAL_TABLET | Freq: Two times a day (BID) | ORAL | Status: DC

## 2013-09-28 MED ORDER — METOPROLOL TARTRATE 1 MG/ML IV SOLN
5.0000 mg | Freq: Once | INTRAVENOUS | Status: AC
  Administered 2013-09-28: 5 mg via INTRAVENOUS

## 2013-09-28 MED ORDER — FUROSEMIDE 10 MG/ML IJ SOLN
INTRAMUSCULAR | Status: AC
  Filled 2013-09-28: qty 2

## 2013-09-28 MED ORDER — METOPROLOL TARTRATE 25 MG PO TABS
25.0000 mg | ORAL_TABLET | Freq: Two times a day (BID) | ORAL | Status: DC
  Administered 2013-09-28 – 2013-09-29 (×3): 25 mg via ORAL
  Filled 2013-09-28 (×4): qty 1

## 2013-09-28 MED ORDER — ALBUTEROL SULFATE HFA 108 (90 BASE) MCG/ACT IN AERS
2.0000 | INHALATION_SPRAY | Freq: Four times a day (QID) | RESPIRATORY_TRACT | Status: DC | PRN
  Filled 2013-09-28: qty 6.7

## 2013-09-28 MED ORDER — ACETAMINOPHEN 160 MG/5ML PO SOLN
650.0000 mg | ORAL | Status: DC | PRN
  Administered 2013-10-05 (×2): 650 mg via ORAL
  Filled 2013-09-28 (×2): qty 20.3

## 2013-09-28 MED ORDER — DILTIAZEM HCL ER 120 MG PO CP24
120.0000 mg | ORAL_CAPSULE | Freq: Every day | ORAL | Status: DC
  Administered 2013-09-28 – 2013-09-29 (×2): 120 mg via ORAL
  Filled 2013-09-28 (×2): qty 1

## 2013-09-28 MED ORDER — CHLORHEXIDINE GLUCONATE CLOTH 2 % EX PADS: 6.0000 | MEDICATED_PAD | Freq: Every day | CUTANEOUS | Status: DC

## 2013-09-28 NOTE — Progress Notes (Signed)
Pt's HR remains in upper 130s. Dr. Dwain Sarna notified. Received telephone orders for an additional 5 mg Metoprolol and a 12 lead EKG. EKG showed SVT with PVCs and fusion complexes. Dr. Dwain Sarna ordered a Magnesium lab STAT and 10 mg Lasix. HR returned to NSR at approximately 0700. MD to round on pt this morning. RN will continue to monitor pt.

## 2013-09-28 NOTE — Progress Notes (Signed)
2 Days Post-Op  Subjective: Had some SVT this AM.  He is back in SR now. He complains of being sore and of his throat hurting.  Nothing much from NG, but he has no bowel sounds.  Objective: Vital signs in last 24 hours: Temp:  [98.7 F (37.1 C)-99.7 F (37.6 C)] 99.7 F (37.6 C) (10/26 0400) Pulse Rate:  [57-134] 80 (10/26 0700) Resp:  [13-29] 20 (10/26 0700) BP: (131-176)/(62-104) 131/66 mmHg (10/26 0700) SpO2:  [86 %-100 %] 96 % (10/26 0700)   350 from NG, 10 ml from colostomy Tm 99.7  BP up some last PM, OK this AM. Labs OK this Am 2D echo 07/11/13  Mild LVH, EF45-50%, Mild AR  Intake/Output from previous day: 10/25 0701 - 10/26 0700 In: 2550 [I.V.:2400; IV Piggyback:150] Out: 4968 [Urine:4550; Emesis/NG output:350; Drains:58; Stool:10] Intake/Output this shift:    General appearance: alert, cooperative and no distress Resp: clear to auscultation bilaterally Cardio: regular rate and rhythm, S1, S2 normal, no murmur, click, rub or gallop GI: soft, sore no bowel sounds, nothing in ostomy, no gas, open wound looks good.  Lab Results:   Recent Labs  09/27/13 0424 09/28/13 0420  WBC 14.9* 11.7*  HGB 8.9* 9.1*  HCT 28.1* 28.1*  PLT 211 212    BMET  Recent Labs  09/27/13 0424 09/28/13 0420  NA 135 136  K 4.3 4.5  CL 100 103  CO2 26 28  GLUCOSE 96 102*  BUN 10 8  CREATININE 0.88 0.98  CALCIUM 8.6 9.2   PT/INR No results found for this basename: LABPROT, INR,  in the last 72 hours   Recent Labs Lab 09/25/13 2030  AST 34  ALT 27  ALKPHOS 109  BILITOT 0.3  PROT 6.8  ALBUMIN 2.9*     Lipase     Component Value Date/Time   LIPASE 32 09/25/2013 2030     Studies/Results: No results found.  Medications: . ipratropium  0.5 mg Nebulization Q6H WA   And  . albuterol  2.5 mg Nebulization Q6H WA  . chlorhexidine  15 mL Mouth/Throat BID  . Chlorhexidine Gluconate Cloth  6 each Topical Daily  . diltiazem  120 mg Oral Daily  . enoxaparin (LOVENOX)  injection  40 mg Subcutaneous Q24H  . metoprolol  5 mg Intravenous Q6H  . mupirocin ointment  1 application Nasal BID  . pantoprazole (PROTONIX) IV  40 mg Intravenous Q24H  . piperacillin-tazobactam (ZOSYN)  IV  3.375 g Intravenous Q8H    Assessment/Plan Abdominal pain with Sigmoid colon perforation from chicken bone.   S/p Exploratory laparotomy, Lysis of adhesions for 75 minutes, Partial sigmoid colectomy Colostomy10/24/2014, Zachary Malady, MD  S/p fall with multiple rib fx, hemothorax at SNF since 07/2013  CAD  Hypertension  COPD  Gout  Depression/Anxiety  SVT new this AM/hx of PAF with RVR/PVC's Hx alcoholic cirrohsis and dependence/hepatitis B   Plan:  i would like to take his NG out but he has no bowel sounds.   Will do clamping trials today, restart PO lopressor and Diltiazem. Mobilize more, add some PO pain medicine also.   He was on nutritional supplement at SNF, check Prealbumin in AM    LOS: 3 days    Zachary [name],Zachary [name] 09/28/2013  Agree with above. Still has NGT. Wound open, but clean.  Zachary Kin, MD, Zachary [name] Pager: [phone] Office phone:  [phone]

## 2013-09-28 NOTE — Progress Notes (Addendum)
Pt transported via wheelchair to 3S15 on monitor. Pt tolerated transfer well. On 2L [place]. VSS. Bedside handoff with Coy Saunas, RN. Pt's belongings transferred include: 1 necklace, 4 rings (wearning 1 ring currently), silver bracelet, watch (currently wearing), wallet, dentures (currently wearing), clothing (jeans, shirt, boxers, socks). Per pt, no other belongings here. Will continue to monitor closely. Kerin Ransom, RN

## 2013-09-28 NOTE — Progress Notes (Signed)
At 0540 pt's HR sustained in upper 130s-140s. Dr. Darrick Penna informed, ordered 5 mg Metoprolol. After beta blocker administration, HR still sustained in upper 120s- low 130s. Dr. Darrick Penna states she is comfortable with this. No further orders given. Will continue to monitor pt.

## 2013-09-28 NOTE — Progress Notes (Signed)
Pt had short run of A-Fib. Rate controlled in 70s-80s. E-Link MD notified. No orders given. Will continue to monitor pt.

## 2013-09-29 ENCOUNTER — Encounter (HOSPITAL_COMMUNITY): Payer: Self-pay | Admitting: General Surgery

## 2013-09-29 DIAGNOSIS — I480 Paroxysmal atrial fibrillation: Secondary | ICD-10-CM

## 2013-09-29 DIAGNOSIS — K567 Ileus, unspecified: Secondary | ICD-10-CM

## 2013-09-29 HISTORY — DX: Paroxysmal atrial fibrillation: I48.0

## 2013-09-29 HISTORY — DX: Other postprocedural complications and disorders of digestive system: K56.7

## 2013-09-29 LAB — CBC
HCT: 29.6 % — ABNORMAL LOW (ref 39.0–52.0)
MCHC: 32.1 g/dL (ref 30.0–36.0)
MCV: 85.1 fL (ref 78.0–100.0)
Platelets: 235 10*3/uL (ref 150–400)
RBC: 3.48 MIL/uL — ABNORMAL LOW (ref 4.22–5.81)
WBC: 10.9 10*3/uL — ABNORMAL HIGH (ref 4.0–10.5)

## 2013-09-29 LAB — PROTIME-INR: Prothrombin Time: 13.7 seconds (ref 11.6–15.2)

## 2013-09-29 LAB — BASIC METABOLIC PANEL
BUN: 12 mg/dL (ref 6–23)
CO2: 27 mEq/L (ref 19–32)
Chloride: 101 mEq/L (ref 96–112)
Creatinine, Ser: 1.01 mg/dL (ref 0.50–1.35)

## 2013-09-29 MED ORDER — WARFARIN - PHARMACIST DOSING INPATIENT
Freq: Every day | Status: DC
  Administered 2013-10-03 – 2013-10-05 (×3)

## 2013-09-29 MED ORDER — DILTIAZEM HCL ER 240 MG PO CP24
240.0000 mg | ORAL_CAPSULE | Freq: Every day | ORAL | Status: DC
  Administered 2013-09-30 – 2013-10-06 (×7): 240 mg via ORAL
  Filled 2013-09-29 (×8): qty 1

## 2013-09-29 MED ORDER — METOPROLOL TARTRATE 25 MG PO TABS
25.0000 mg | ORAL_TABLET | Freq: Three times a day (TID) | ORAL | Status: DC
  Administered 2013-09-29 – 2013-09-30 (×3): 25 mg via ORAL
  Filled 2013-09-29 (×5): qty 1

## 2013-09-29 MED ORDER — METOPROLOL TARTRATE 1 MG/ML IV SOLN
5.0000 mg | Freq: Once | INTRAVENOUS | Status: AC
  Administered 2013-09-29: 5 mg via INTRAVENOUS

## 2013-09-29 MED ORDER — WARFARIN SODIUM 5 MG PO TABS
5.0000 mg | ORAL_TABLET | Freq: Once | ORAL | Status: AC
  Administered 2013-09-29: 5 mg via ORAL
  Filled 2013-09-29: qty 1

## 2013-09-29 NOTE — Progress Notes (Addendum)
3 Days Post-Op  Subjective: No complaints, still nothing but clear fluid in his ostomy, Telem SR with some PVC's.    Objective: Vital signs in last 24 hours: Temp:  [97.4 F (36.3 C)-99.2 F (37.3 C)] 99.1 F (37.3 C) (10/27 0453) Pulse Rate:  [56-105] 62 (10/27 0453) Resp:  [11-19] 14 (10/27 0453) BP: (116-182)/(55-87) 142/76 mmHg (10/27 0453) SpO2:  [92 %-99 %] 93 % (10/27 0715)  380 ml from NG 35 ml from drain TM 99.2, BP up some, HR down to 60's now WBC 10.9, H/H stable Day 4 Zosyn, some: PROTEUS MIRABILIS from peritoneal fluid culture; sensitive to everything   Urine culture negative  Intake/Output from previous day: 10/26 0701 - 10/27 0700 In: 2310 [I.V.:2200; NG/GT:60; IV Piggyback:50] Out: 2165 [Urine:1750; Emesis/NG output:380; Drains:35] Intake/Output this shift:    General appearance: alert, cooperative and no distress Resp: clear to auscultation bilaterally GI: still has some distension, BS hypoactive, drain is clear, ostomy looks good, just clear fluid in bag now no gas.  Open wound is clean and looks good.  Lab Results:   Recent Labs  09/28/13 0420 09/29/13 0445  WBC 11.7* 10.9*  HGB 9.1* 9.5*  HCT 28.1* 29.6*  PLT 212 235    BMET  Recent Labs  09/28/13 0420 09/29/13 0445  NA 136 136  K 4.5 4.1  CL 103 101  CO2 28 27  GLUCOSE 102* 86  BUN 8 12  CREATININE 0.98 1.01  CALCIUM 9.2 9.3   PT/INR No results found for this basename: LABPROT, INR,  in the last 72 hours   Recent Labs Lab 09/25/13 2030  AST 34  ALT 27  ALKPHOS 109  BILITOT 0.3  PROT 6.8  ALBUMIN 2.9*     Lipase     Component Value Date/Time   LIPASE 32 09/25/2013 2030     Studies/Results: No results found.  Medications: . ipratropium  0.5 mg Nebulization Q6H WA   And  . albuterol  2.5 mg Nebulization Q6H WA  . chlorhexidine  15 mL Mouth/Throat BID  . diltiazem  120 mg Oral Daily  . enoxaparin (LOVENOX) injection  40 mg Subcutaneous Q24H  . metoprolol  tartrate  25 mg Oral BID  . pantoprazole (PROTONIX) IV  40 mg Intravenous Q24H  . piperacillin-tazobactam (ZOSYN)  IV  3.375 g Intravenous Q8H    Assessment/Plan Abdominal pain with Sigmoid colon perforation from chicken bone.  S/p Exploratory laparotomy, Lysis of adhesions for 75 minutes, Partial sigmoid colectomy Colostomy10/24/2014, Liz Malady, MD  Post op ileus S/p fall with multiple rib fx, hemothorax at SNF since 07/2013  CAD  Hypertension  COPD  Gout  Depression/Anxiety  SVT new this AM/hx of PAF with RVR/PVC's  Hx alcoholic cirrhosis/alcoholic dependence/hepatitis B  Malnutrition   Plan;  Mobilize, ice chips, meds with sips, 300 from NG last 2 days so I took it out.  He is in sinus right now with some PVC's.  He did not leave her after multiple rib fx and hemothorax with anticoagulation, I will ask Dr. Algie Coffer to let me know if we should have him on something prior to discharge.   Pre albumin 3.6 Dr. Algie Coffer has seen pt and is placing him on coumadin.  We will also get dietician to see.   LOS: 4 days    Katherina Wimer 09/29/2013

## 2013-09-29 NOTE — Progress Notes (Addendum)
ANTICOAGULATION CONSULT NOTE - Initial Consult  Pharmacy Consult for Warfarin Indication: atrial fibrillation  No Known Allergies  Patient Measurements: Height: 5' 6.5" (168.9 cm) Weight: 156 lb 1.4 oz (70.8 kg) IBW/kg (Calculated) : 64.95  Vital Signs: Temp: 98.6 F (37 C) (10/27 1117) Temp src: Oral (10/27 1117) BP: 105/81 mmHg (10/27 1117) Pulse Rate: 65 (10/27 1117)  Labs:  Recent Labs  09/27/13 0424 09/28/13 0420 09/29/13 0445  HGB 8.9* 9.1* 9.5*  HCT 28.1* 28.1* 29.6*  PLT 211 212 235  CREATININE 0.88 0.98 1.01    Estimated Creatinine Clearance: 62.6 ml/min (by C-G formula based on Cr of 1.01).   Medical History: Past Medical History  Diagnosis Date  . Hypertension   . Coronary artery disease   . Hyperlipemia   . Angina   . Shortness of breath   . Mental disorder   . Arthritis   . COPD (chronic obstructive pulmonary disease)   . Hiatal hernia   . Depression   . Gout   . Joint pain   . Depression   . Anxiety   . Ileus, postoperative 09/29/2013  . PAF (paroxysmal atrial fibrillation) 09/29/2013    On lopressor and Cardizem at home, no anticoagulation.    Medications:  Prescriptions prior to admission  Medication Sig Dispense Refill  . albuterol (PROVENTIL HFA;VENTOLIN HFA) 108 (90 BASE) MCG/ACT inhaler Inhale 2 puffs into the lungs every 6 (six) hours as needed for wheezing.      . Amino Acids-Protein Hydrolys (FEEDING SUPPLEMENT, PRO-STAT SUGAR FREE 64,) LIQD Take 30 mLs by mouth 2 (two) times daily.      Marland Kitchen diltiazem (DILACOR XR) 120 MG 24 hr capsule Take 1 capsule (120 mg total) by mouth daily.      Marland Kitchen dronabinol (MARINOL) 2.5 MG capsule Take 1 capsule (2.5 mg total) by mouth 2 (two) times daily before lunch and supper.      . gabapentin (NEURONTIN) 100 MG capsule Take 100 mg by mouth 3 (three) times daily.      . metoprolol tartrate (LOPRESSOR) 25 MG tablet Take 25 mg by mouth 2 (two) times daily.      . naproxen (NAPROSYN) 500 MG tablet Take  500 mg by mouth 2 (two) times daily with a meal.      . pantoprazole (PROTONIX) 40 MG tablet Take 40 mg by mouth daily.      . traMADol (ULTRAM) 50 MG tablet Take 50-100 mg by mouth every 6 (six) hours as needed for pain.      . traZODone (DESYREL) 50 MG tablet Take 50-100 mg by mouth at bedtime as needed for sleep. *takes an extra 50mg  if need help sleeping      . Vitamin D, Ergocalciferol, (DRISDOL) 50000 UNITS CAPS capsule Take 50,000 Units by mouth every 30 (thirty) days.        Assessment: 72 yo M admitted 09/25/2013  From a SNF with 3 d of abdmonial pain.  S/p partial sigmoid colectomy 10.24 from colon perforation due to chicken bone.  Pharmacy consulted 10.27 to start warfarin for paroxysmal afib.  Events: Patient is POD#3 from coletomy, stable with no bleeding.  Patient with history of alcohol and traumatic falls, not previously on warfarin, now that patient is in SNF, felt his risk of falls is less and warfarin is being initiated.  Coag: Afib  H/H low bust stable. Enoxaparin 40 mg daily  No bleeding noted per  RN.    ID: Empiric post bowel perf. Afeb, WBC  trend down. 10/24 Zosyn>>  Renal: Crcl ~ 60 ml/min  Goal of Therapy:  INR 2-3 Monitor platelets by anticoagulation protocol: Yes   Plan:  1. Follow up baseline INR, if WNL will initiate 5 mg PO today 2. Daily INR.    Thank you for allowing pharmacy to be a part of this patients care team.  Lovenia Kim Pharm.D., BCPS Clinical Pharmacist 09/29/2013 2:03 PM Pager: [phone] Phone: [phone]   Addendum: Baseline INR 1.07 (normal).  Will order Coumadin 5mg  PO x 1 as planned.  [employer], Pharm.D., BCPS Clinical Pharmacist Pager [phone] 09/29/2013 4:12 PM

## 2013-09-29 NOTE — Consult Note (Signed)
Zachary [name] is an 72 y.o. male.   Chief Complaint: Abnormal heart rhythm HPI: 72 years old male with Sigmoid colon perforation from chicken bone had partial sigmoid colon colostomy on 09/27/2011 by Dr. Violeta Gelinas. He is nursing home resident and without alcohol since 07/2013. He has history of paroxsymal atrial fibrillation, SVT and frequent Ventricular Premature Complexes. He has begun ambulation.  Past Medical History  Diagnosis Date  . Hypertension   . Coronary artery disease   . Hyperlipemia   . Angina   . Shortness of breath   . Mental disorder   . Arthritis   . COPD (chronic obstructive pulmonary disease)   . Hiatal hernia   . Depression   . Gout   . Joint pain   . Depression   . Anxiety   . Ileus, postoperative 09/29/2013  . PAF (paroxysmal atrial fibrillation) 09/29/2013    On lopressor and Cardizem at home, no anticoagulation.      Past Surgical History  Procedure Laterality Date  . Abdominal surgery      History reviewed. No pertinent family history. Social History:  reports that he has been smoking Cigarettes.  He has a 15 pack-year smoking history. He has quit using smokeless tobacco. He reports that he drinks about 7.2 ounces of alcohol per week. He reports that he does not use illicit drugs.  Allergies: No Known Allergies  Medications Prior to Admission  Medication Sig Dispense Refill  . albuterol (PROVENTIL HFA;VENTOLIN HFA) 108 (90 BASE) MCG/ACT inhaler Inhale 2 puffs into the lungs every 6 (six) hours as needed for wheezing.      . Amino Acids-Protein Hydrolys (FEEDING SUPPLEMENT, PRO-STAT SUGAR FREE 64,) LIQD Take 30 mLs by mouth 2 (two) times daily.      Marland Kitchen diltiazem (DILACOR XR) 120 MG 24 hr capsule Take 1 capsule (120 mg total) by mouth daily.      Marland Kitchen dronabinol (MARINOL) 2.5 MG capsule Take 1 capsule (2.5 mg total) by mouth 2 (two) times daily before lunch and supper.      . gabapentin (NEURONTIN) 100 MG capsule Take 100 mg by mouth 3 (three) times  daily.      . metoprolol tartrate (LOPRESSOR) 25 MG tablet Take 25 mg by mouth 2 (two) times daily.      . naproxen (NAPROSYN) 500 MG tablet Take 500 mg by mouth 2 (two) times daily with a meal.      . pantoprazole (PROTONIX) 40 MG tablet Take 40 mg by mouth daily.      . traMADol (ULTRAM) 50 MG tablet Take 50-100 mg by mouth every 6 (six) hours as needed for pain.      . traZODone (DESYREL) 50 MG tablet Take 50-100 mg by mouth at bedtime as needed for sleep. *takes an extra 50mg  if need help sleeping      . Vitamin D, Ergocalciferol, (DRISDOL) 50000 UNITS CAPS capsule Take 50,000 Units by mouth every 30 (thirty) days.        Results for orders placed during the hospital encounter of 09/25/13 (from the past 48 hour(s))  CBC     Status: Abnormal   Collection Time    09/28/13  4:20 AM      Result Value Range   WBC 11.7 (*) 4.0 - 10.5 K/uL   RBC 3.31 (*) 4.22 - 5.81 MIL/uL   Hemoglobin 9.1 (*) 13.0 - 17.0 g/dL   HCT 16.1 (*) 09.6 - 04.5 %   MCV 84.9  78.0 - 100.0  fL   MCH 27.5  26.0 - 34.0 pg   MCHC 32.4  30.0 - 36.0 g/dL   RDW 40.9  81.1 - 91.4 %   Platelets 212  150 - 400 K/uL  BASIC METABOLIC PANEL     Status: Abnormal   Collection Time    09/28/13  4:20 AM      Result Value Range   Sodium 136  135 - 145 mEq/L   Potassium 4.5  3.5 - 5.1 mEq/L   Chloride 103  96 - 112 mEq/L   CO2 28  19 - 32 mEq/L   Glucose, Bld 102 (*) 70 - 99 mg/dL   BUN 8  6 - 23 mg/dL   Creatinine, Ser 7.82  0.50 - 1.35 mg/dL   Calcium 9.2  8.4 - 95.6 mg/dL   GFR calc non Af Amer 81 (*) >90 mL/min   GFR calc Af Amer >90  >90 mL/min   Comment: (NOTE)     The eGFR has been calculated using the CKD EPI equation.     This calculation has not been validated in all clinical situations.     eGFR's persistently <90 mL/min signify possible Chronic Kidney     Disease.  MAGNESIUM     Status: None   Collection Time    09/28/13  9:50 AM      Result Value Range   Magnesium 1.7  1.5 - 2.5 mg/dL  CBC     Status:  Abnormal   Collection Time    09/29/13  4:45 AM      Result Value Range   WBC 10.9 (*) 4.0 - 10.5 K/uL   RBC 3.48 (*) 4.22 - 5.81 MIL/uL   Hemoglobin 9.5 (*) 13.0 - 17.0 g/dL   HCT 21.3 (*) 08.6 - 57.8 %   MCV 85.1  78.0 - 100.0 fL   MCH 27.3  26.0 - 34.0 pg   MCHC 32.1  30.0 - 36.0 g/dL   RDW 46.9  62.9 - 52.8 %   Platelets 235  150 - 400 K/uL  BASIC METABOLIC PANEL     Status: Abnormal   Collection Time    09/29/13  4:45 AM      Result Value Range   Sodium 136  135 - 145 mEq/L   Potassium 4.1  3.5 - 5.1 mEq/L   Chloride 101  96 - 112 mEq/L   CO2 27  19 - 32 mEq/L   Glucose, Bld 86  70 - 99 mg/dL   BUN 12  6 - 23 mg/dL   Creatinine, Ser 4.13  0.50 - 1.35 mg/dL   Calcium 9.3  8.4 - 24.4 mg/dL   GFR calc non Af Amer 73 (*) >90 mL/min   GFR calc Af Amer 85 (*) >90 mL/min   Comment: (NOTE)     The eGFR has been calculated using the CKD EPI equation.     This calculation has not been validated in all clinical situations.     eGFR's persistently <90 mL/min signify possible Chronic Kidney     Disease.  PREALBUMIN     Status: Abnormal   Collection Time    09/29/13  4:45 AM      Result Value Range   Prealbumin 3.6 (*) 17.0 - 34.0 mg/dL   Comment: Performed at [facility]   No results found.  ROS Constitutional: Negative.  HENT: Negative.  Eyes: Negative.  Respiratory: Positive for shortness of breath.  Cardiovascular: Positive for chest pain.  Gastrointestinal: Negative.  Genitourinary: Negative.  Musculoskeletal: Negative.  Skin: Negative.  Neurological: Negative.  Endo/Heme/Allergies: Negative.  Psychiatric/Behavioral: Negative.   Blood pressure 105/81, pulse 65, temperature 98.6 F (37 C), temperature source Oral, resp. rate 19, height 5' 6.5" (1.689 m), weight 70.8 kg (156 lb 1.4 oz), SpO2 95.00%. Physical exam: Constitutional: He appears well-developed and well-nourished.  HENT: Head: Normocephalic and atraumatic. Eyes: Conjunctivae and EOM are normal.  Pupils are equal, round, and reactive to light.  Neck: Normal range of motion. Neck supple.  Cardiovascular: Tachycardic, regular rhythm and normal heart sounds.  Respiratory: Clear bilateral with decreased air entry at bases.  GI: Bowel sounds are normal. Mid and lower abdominal dressing. Musculoskeletal: Normal range of motion.  Neurological: He is alert and oriented to person, place, and time. Moves all 4 extremities. Skin: Skin is warm and dry.  Psychiatric: Appears somewhat anxious.   Assessment/Plan SVT Paroxysmal atrial fibrillation  Frequent ventricular premature beats  S/P Sigmoid colon colostomy S/P fall  S/P multiple rib fratures with hemothorax  CAD  COPD  Gout  Alcohol use disorder  Increase diltiazem dose. Resume coumadin per pharmacy. Increase activity. INR goal-2-3.  Marycarmen Hagey S 09/29/2013, 1:27 PM

## 2013-09-29 NOTE — Progress Notes (Signed)
Clinical Social Work Department BRIEF PSYCHOSOCIAL ASSESSMENT 09/29/2013  Patient:  Zachary [name], Zachary [name]     Account Number:  [id number]     Admit date:  09/25/2013  Clinical Social Worker:  Leron Croak, CLINICAL SOCIAL WORKER  Date/Time:  09/29/2013 11:44 AM  Referred by:  Physician  Date Referred:  09/29/2013 Referred for  SNF Placement   Other Referral:   Interview type:  Other - See comment Other interview type:   Pt responsible party per facility is  1st contact:  Zachary [name] [phone]  [facility] Friend)  2nd contact  Ava Lequita Halt  [phone]  3rd Sister  Lorina Rabon  [phone]    PSYCHOSOCIAL DATA Living Status:  FACILITY Admitted from facility:  [facility] Level of care:  Skilled Nursing Facility Primary support name:  Zachary [name]  [phone] Primary support relationship to patient:  FRIEND Degree of support available:   Pt has limited support and primary responsible party stated she was overwhelmed and had to place Pt in a facilty. Pt's sister lives out of town and has also agreed to Pt placement.    CURRENT CONCERNS Current Concerns  Post-Acute Placement   Other Concerns:    SOCIAL WORK ASSESSMENT / PLAN CSW spoke with Marylene Land from [facility] concerning Pt. Marylene Land stated that Pt is a resident there for the last three months and that Pt was placed my Ms. Zachary [name] due to she was unable to assist in taking care of Pt any longer. Whitney Post stated that "she is pregnant, has other children, and has a sick mother and it became too much to take care of Pt, so she placed him."  Pt has intermitent alertness and is unable to make decisions at this time per facility and responsible party. [place] does agree for Pt to return to facility and facility is willing to accept Pt back at d/c.  CSW will follow for d/c planning back to facility at time of d/c.   Assessment/plan status:  Information/Referral to [facility] Other assessment/ plan:    Information/referral to community resources:   No additional resources needed at this time.    PATIENT'S/FAMILY'S RESPONSE TO PLAN OF CARE: Facility and Ms. Zachary [name] are appreciative for assistance with Pt d/c.     Leron Croak, LCSWA [facility] Emergency Dept.  [phone]

## 2013-09-29 NOTE — Progress Notes (Signed)
Utilization review completed.  

## 2013-09-29 NOTE — Progress Notes (Signed)
INITIAL NUTRITION ASSESSMENT  DOCUMENTATION CODES Per approved criteria  -Severe malnutrition in the context of chronic illness   INTERVENTION:  Diet advancement per MD as able.   Recommend adding Resource Breeze PO TID when diet is advanced to clear liquids, each supplement provides 250 kcal and 9 grams of protein.  NUTRITION DIAGNOSIS: Inadequate oral intake related to altered GI function as evidenced by NPO status.   Goal: Intake to meet >90% of estimated nutrition needs.  Monitor:  Diet advancement, PO intake, labs, weight trend.  Reason for Assessment: MD Consult for PCM  72 y.o. male  Admitting Dx: Perforation of sigmoid colon  ASSESSMENT: Patient admitted on 10/23 with sigmoid colon perforation from chicken bone. S/P exploratory laparotomy with lysis of adhesions and partial sigmoid colectomy with colostomy on 10/24. Plans to advance diet to clear liquids tomorrow once colostomy has output.  Patient with recent hospital admission S/P fall with multiple rib fractures and hemothorax; discharged to SNF in August 2014. Patient with a history of alcohol abuse. Patient sleeping upon RD entering his room. Did not awaken when name called x 3.  Nutrition Focused Physical Exam:  Subcutaneous Fat:  Orbital Region: mild-moderate depletion Upper Arm Region: severe depletion Thoracic and Lumbar Region: NA  Muscle:  Temple Region: severe depletion Clavicle Bone Region: severe depletion Clavicle and Acromion Bone Region: severe depletion Scapular Bone Region: NA Dorsal Hand: mild-moderate depletion Patellar Region: mild-moderate depletion Anterior Thigh Region: severe depletion Posterior Calf Region: mild-moderate depletion  Edema: none  Pt meets criteria for severe MALNUTRITION in the context of chronic illness as evidenced by severe loss of muscle and subcutaneous fat mass.   Height: Ht Readings from Last 1 Encounters:  09/25/13 5' 6.5" (1.689 m)    Weight: Wt  Readings from Last 1 Encounters:  09/26/13 156 lb 1.4 oz (70.8 kg)    Ideal Body Weight: 65.9 kg  % Ideal Body Weight: 107%  Wt Readings from Last 10 Encounters:  09/26/13 156 lb 1.4 oz (70.8 kg)  09/26/13 156 lb 1.4 oz (70.8 kg)  06/30/13 142 lb 3.2 oz (64.5 kg)  06/22/13 147 lb 4.3 oz (66.8 kg)  05/31/13 145 lb (65.772 kg)  05/15/13 138 lb (62.596 kg)  12/07/12 145 lb 15.1 oz (66.2 kg)  11/20/12 145 lb 15.1 oz (66.2 kg)  02/25/12 144 lb (65.318 kg)  10/13/11 186 lb (84.369 kg)    Usual Body Weight: 142-147 lb (3 months ago)  % Usual Body Weight: 108%  BMI:  Body mass index is 24.82 kg/(m^2).  Estimated Nutritional Needs: Kcal: 1750-1950 Protein: 85-100 gm Fluid: 1.8-2 L  Skin: abdominal incision  Diet Order: NPO  EDUCATION NEEDS: -Education not appropriate at this time   Intake/Output Summary (Last 24 hours) at 09/29/13 1529 Last data filed at 09/29/13 0715  Gross per 24 hour  Intake   1500 ml  Output   1140 ml  Net    360 ml    Last BM:  Unknown  Labs:   Recent Labs Lab 09/27/13 0424 09/28/13 0420 09/28/13 0950 09/29/13 0445  NA 135 136  --  136  K 4.3 4.5  --  4.1  CL 100 103  --  101  CO2 26 28  --  27  BUN 10 8  --  12  CREATININE 0.88 0.98  --  1.01  CALCIUM 8.6 9.2  --  9.3  MG  --   --  1.7  --   GLUCOSE 96 102*  --  86    CBG (last 3)  No results found for this basename: GLUCAP,  in the last 72 hours  Scheduled Meds: . ipratropium  0.5 mg Nebulization Q6H WA   And  . albuterol  2.5 mg Nebulization Q6H WA  . chlorhexidine  15 mL Mouth/Throat BID  . [START ON 09/30/2013] diltiazem  240 mg Oral Daily  . enoxaparin (LOVENOX) injection  40 mg Subcutaneous Q24H  . metoprolol tartrate  25 mg Oral TID  . pantoprazole (PROTONIX) IV  40 mg Intravenous Q24H  . piperacillin-tazobactam (ZOSYN)  IV  3.375 g Intravenous Q8H  . Warfarin - Pharmacist Dosing Inpatient   Does not apply q1800    Continuous Infusions: . dextrose 5 % and 0.45  % NaCl with KCl 20 mEq/L 100 mL/hr at 09/29/13 1037    Past Medical History  Diagnosis Date  . Hypertension   . Coronary artery disease   . Hyperlipemia   . Angina   . Shortness of breath   . Mental disorder   . Arthritis   . COPD (chronic obstructive pulmonary disease)   . Hiatal hernia   . Depression   . Gout   . Joint pain   . Depression   . Anxiety   . Ileus, postoperative 09/29/2013  . PAF (paroxysmal atrial fibrillation) 09/29/2013    On lopressor and Cardizem at home, no anticoagulation.    Past Surgical History  Procedure Laterality Date  . Abdominal surgery    . Laparotomy N/A 09/26/2013    Procedure: EXPLORATORY LAPAROTOMY;  Surgeon: Liz Malady, MD;  Location: [facility] OR;  Service: General;  Laterality: N/A;    Joaquin Courts, RD, LDN, CNSC Pager [phone] After Hours Pager [phone]

## 2013-09-29 NOTE — Consult Note (Signed)
WOC ostomy consult note Stoma type/location: LLQ, end colostomy Stomal assessment/size 1 1/2" x 1 3/4" oval shaped, slightly budded from skin. Pink and moist Peristomal assessment: intact Treatment options for stomal/peristomal skin: none Output bloody drainage only Ostomy pouching: 2pc. 2 3/4" placed today, used convex wafer today however may need flat, I have ordered flat moving forward.  Education provided: pt from SNF and plans to return there.  Pt does have some history of alcohol use and it is apparent he is somewhat disoriented to the situation at times.  I have demonstrated pouch change to the patient today. He was able to demonstrate Lock and roll closure to me a couple of times.  He did watch me complete the pouch change. I have ordered supplies and I have left ostomy educational booklet in the room for the patient.  WOC team will follow along with you for ostomy teaching and care. Ryett Hamman [place] RN,CWOCN [phone]

## 2013-09-29 NOTE — Progress Notes (Signed)
Physical Therapy Treatment Patient Details Name: Zachary [name] MRN: [id number] DOB: 09/14/1941 Today's Date: 09/29/2013 Time: 6578-4696 PT Time Calculation (min): 20 min  PT Assessment / Plan / Recommendation  History of Present Illness Adm for abd pain and found to have perforated bowel. Underwent emergency partial colectomy and colostomy   PT Comments   Pt progressing well. Pt con't to require use of RW and O2 for ambulation. Pt to con't to benefit from ST-SNF to achieve safe mod I function for safe transition to a place on his own.   Follow Up Recommendations  SNF     Does the patient have the potential to tolerate intense rehabilitation     Barriers to Discharge        Equipment Recommendations  None recommended by PT    Recommendations for Other Services    Frequency Min 2X/week   Progress towards PT Goals Progress towards PT goals: Progressing toward goals  Plan Current plan remains appropriate    Precautions / Restrictions Precautions Precautions: Fall Restrictions Weight Bearing Restrictions: No   Pertinent Vitals/Pain Incisional pain but did not rate    Mobility  Bed Mobility Bed Mobility: Supine to Sit Supine to Sit: 5: Supervision;With rails;HOB elevated Sitting - Scoot to Edge of Bed: 5: Supervision Details for Bed Mobility Assistance: increased time, definite use of bed rail Transfers Transfers: Sit to Stand;Stand to Sit Sit to Stand: 5: Supervision Stand to Sit: 5: Supervision Details for Transfer Assistance: v/c's to not pull up from walker Ambulation/Gait Ambulation/Gait Assistance: 5: Supervision Ambulation Distance (Feet): 300 Feet Assistive device: Rolling walker Ambulation/Gait Assistance Details: no episodes of LOB, good walker management Gait Pattern: Step-to pattern Gait velocity: decreased Stairs: No    Exercises     PT Diagnosis:    PT Problem List:   PT Treatment Interventions:     PT Goals (current goals can now be found in  the care plan section)    Visit Information  Last PT Received On: 09/29/13 Assistance Needed: +1 History of Present Illness: Adm for abd pain and found to have perforated bowel. Underwent emergency partial colectomy and colostomy    Subjective Data  Subjective: Pt agreeable to amb   Cognition  Cognition Arousal/Alertness: Awake/alert Behavior During Therapy: WFL for tasks assessed/performed    Balance     End of Session PT - End of Session Equipment Utilized During Treatment: Oxygen Activity Tolerance: Patient tolerated treatment well Patient left: in chair;with call bell/phone within reach Nurse Communication: Mobility status   GP     Marcene Brawn 09/29/2013, 4:50 PM  Lewis Shock, PT, DPT Pager #: [phone] Office #: [phone]

## 2013-09-29 NOTE — Progress Notes (Signed)
Zachary [name]. Zachary Skains, MD, [facility] Surgery  General/ Trauma Surgery  09/29/2013 3:15 PM

## 2013-09-29 NOTE — Progress Notes (Signed)
Wound open - clean Ostomy - pink, viable; no stool output  Wilmon Arms. Corliss Skains, MD, [facility] Surgery  General/ Trauma Surgery  09/29/2013 9:36 AM

## 2013-09-29 NOTE — Progress Notes (Signed)
OT Cancellation Note  Patient Details Name: Zachary [name] MRN: [id number] DOB: 09-20-1941   Cancelled Treatment:    Reason Eval/Treat Not Completed: Other (comment) Pt from SNF. Scheduled to D/C to SNF. Defer OT to SNF. Signing off. Please reorder if eval needed. Thanks [facility] Brantlee Hinde, OTR/L  [phone] 09/29/2013 09/29/2013, 3:35 PM

## 2013-09-29 NOTE — Progress Notes (Signed)
CSW spoke with Marylene Land from [facility] and gave update.   Per Marylene Land, Pt is able to return today if medically stable for d/c. If Pt not d/c'd today, Pt will continue to have an available bed at facility.   Assessment to follow.   Leron Croak, LCSWA [facility] Emergency Dept.  [phone]

## 2013-09-30 LAB — ANAEROBIC CULTURE

## 2013-09-30 LAB — PROTIME-INR
INR: 1.14 (ref 0.00–1.49)
Prothrombin Time: 14.4 seconds (ref 11.6–15.2)

## 2013-09-30 MED ORDER — WARFARIN SODIUM 5 MG PO TABS
5.0000 mg | ORAL_TABLET | Freq: Once | ORAL | Status: AC
  Administered 2013-09-30: 5 mg via ORAL
  Filled 2013-09-30: qty 1

## 2013-09-30 MED ORDER — ALBUTEROL SULFATE (5 MG/ML) 0.5% IN NEBU
2.5000 mg | INHALATION_SOLUTION | Freq: Three times a day (TID) | RESPIRATORY_TRACT | Status: DC
  Administered 2013-10-01 – 2013-10-04 (×12): 2.5 mg via RESPIRATORY_TRACT
  Administered 2013-10-05: 5 mg via RESPIRATORY_TRACT
  Administered 2013-10-05 – 2013-10-06 (×3): 2.5 mg via RESPIRATORY_TRACT
  Filled 2013-09-30 (×16): qty 0.5

## 2013-09-30 MED ORDER — ONDANSETRON HCL 4 MG/2ML IJ SOLN: 4.0000 mg | INTRAMUSCULAR | Status: DC | PRN

## 2013-09-30 MED ORDER — IPRATROPIUM BROMIDE 0.02 % IN SOLN
0.5000 mg | Freq: Three times a day (TID) | RESPIRATORY_TRACT | Status: DC
  Administered 2013-10-01 – 2013-10-06 (×16): 0.5 mg via RESPIRATORY_TRACT
  Filled 2013-09-30 (×16): qty 2.5

## 2013-09-30 MED ORDER — METOPROLOL SUCCINATE ER 25 MG PO TB24
25.0000 mg | ORAL_TABLET | Freq: Every evening | ORAL | Status: DC
  Administered 2013-10-01 – 2013-10-05 (×5): 25 mg via ORAL
  Filled 2013-09-30 (×7): qty 1

## 2013-09-30 MED ORDER — WHITE PETROLATUM GEL
Status: AC
  Administered 2013-09-30: 0.2
  Filled 2013-09-30: qty 5

## 2013-09-30 MED ORDER — BISACODYL 10 MG RE SUPP
10.0000 mg | Freq: Once | RECTAL | Status: DC
  Filled 2013-09-30: qty 1

## 2013-09-30 MED ORDER — METOCLOPRAMIDE HCL 5 MG/ML IJ SOLN
10.0000 mg | Freq: Four times a day (QID) | INTRAMUSCULAR | Status: DC
  Administered 2013-09-30 – 2013-10-05 (×19): 10 mg via INTRAVENOUS
  Filled 2013-09-30 (×27): qty 2

## 2013-09-30 NOTE — Progress Notes (Signed)
4 Days Post-Op  Subjective: Still distended, no flatus, no nausea. Complains of cramps up walking some.  HR down to 40 last pm transient, and Dr. Riki Altes has adjusted meds.  Objective: Vital signs in last 24 hours: Temp:  [98.3 F (36.8 C)-98.9 F (37.2 C)] 98.9 F (37.2 C) (10/28 1118) Pulse Rate:  [56-70] 64 (10/28 1118) Resp:  [15-28] 17 (10/28 1118) BP: (101-156)/(62-84) 137/70 mmHg (10/28 1118) SpO2:  [92 %-99 %] 96 % (10/28 1118)  PO ? 5 ml from the drain,and no nausea with NG out. Afebrile, VSS No labs started on coumadin yesterday Intake/Output from previous day: 10/27 0701 - 10/28 0700 In: 2550 [I.V.:2300; IV Piggyback:250] Out: 1355 [Urine:1350; Drains:5] Intake/Output this shift: Total I/O In: 400 [I.V.:400] Out: 325 [Urine:300; Drains:25]  General appearance: alert, cooperative and no distress Resp: clear to auscultation bilaterally and some end expiratory wheezing GI: still distended, + BS, tender, some discomfort, open wound looks good .  Ostomy looks fine, nothing but clear fluid in ostomy bag.  Lab Results:   Recent Labs  09/28/13 0420 09/29/13 0445  WBC 11.7* 10.9*  HGB 9.1* 9.5*  HCT 28.1* 29.6*  PLT 212 235    BMET  Recent Labs  09/28/13 0420 09/29/13 0445  NA 136 136  K 4.5 4.1  CL 103 101  CO2 28 27  GLUCOSE 102* 86  BUN 8 12  CREATININE 0.98 1.01  CALCIUM 9.2 9.3   PT/INR  Recent Labs  09/29/13 1430 09/30/13 0557  LABPROT 13.7 14.4  INR 1.07 1.14     Recent Labs Lab 09/25/13 2030  AST 34  ALT 27  ALKPHOS 109  BILITOT 0.3  PROT 6.8  ALBUMIN 2.9*     Lipase     Component Value Date/Time   LIPASE 32 09/25/2013 2030     Studies/Results: No results found.  Medications: . ipratropium  0.5 mg Nebulization Q6H WA   And  . albuterol  2.5 mg Nebulization Q6H WA  . chlorhexidine  15 mL Mouth/Throat BID  . diltiazem  240 mg Oral Daily  . enoxaparin (LOVENOX) injection  40 mg Subcutaneous Q24H  . metoprolol  succinate  25 mg Oral QPM  . pantoprazole (PROTONIX) IV  40 mg Intravenous Q24H  . piperacillin-tazobactam (ZOSYN)  IV  3.375 g Intravenous Q8H  . warfarin  5 mg Oral ONCE-1800  . Warfarin - Pharmacist Dosing Inpatient   Does not apply q1800    Assessment/Plan Abdominal pain with Sigmoid colon perforation from chicken bone.  S/p Exploratory laparotomy, Lysis of adhesions for 75 minutes, Partial sigmoid colectomy Colostomy10/24/2014, Liz Malady, MD  Post op ileus  S/p fall with multiple rib fx, hemothorax at SNF since 07/2013  CAD  Hypertension  COPD  Gout  Depression/Anxiety  SVT new this AM/hx of PAF with RVR/PVC's (now on coumadin) Hx alcoholic cirrhosis/alcoholic dependence/hepatitis B  Malnutrition (prealbumin 3.6)   Plan:  I am still concerned about his post op ileus, he still has not opened up.  Still distended.  Regular prior to obstruction.    His heart rate is stable and I appreciate Dr. Roseanne Kaufman help.  I will try a suppository today, but if it fails we need to consider PICC line and TNA.  Recheck chem 7 in AM.  LOS: 5 days    Asees Manfredi 09/30/2013

## 2013-09-30 NOTE — Progress Notes (Signed)
ANTICOAGULATION CONSULT NOTE - Follow Up Consult  Pharmacy Consult for Coumadin Indication: atrial fibrillation  No Known Allergies  Patient Measurements: Height: 5' 6.5" (168.9 cm) Weight: 156 lb 1.4 oz (70.8 kg) IBW/kg (Calculated) : 64.95  Vital Signs: Temp: 98.7 F (37.1 C) (10/28 0700) Temp src: Oral (10/28 0700) BP: 146/68 mmHg (10/28 0722) Pulse Rate: 63 (10/28 0722)  Labs:  Recent Labs  09/28/13 0420 09/29/13 0445 09/29/13 1430 09/30/13 0557  HGB 9.1* 9.5*  --   --   HCT 28.1* 29.6*  --   --   PLT 212 235  --   --   LABPROT  --   --  13.7 14.4  INR  --   --  1.07 1.14  CREATININE 0.98 1.01  --   --     Estimated Creatinine Clearance: 62.6 ml/min (by C-G formula based on Cr of 1.01).  Assessment: 72yom POD#4 colectomy started on coumadin yesterday for PAF. INR remains subtherapeutic as expected after first dose of coumadin.  Goal of Therapy:  INR 2-3 Monitor platelets by anticoagulation protocol: Yes   Plan:  1) Repeat coumadin 5mg  x 1 tonight 2) INR in AM 3) Continue lovenox 40mg  sq daily until INR at goal  Fredrik Rigger 09/30/2013,9:59 AM

## 2013-09-30 NOTE — Progress Notes (Signed)
Mild nausea, Still distended with post-op ileus  No significant ileostomy output Wound is OK  Awaiting bowel function Add Reglan/ PRN Zofran Probable PICC/ TNA tomorrow.  Wilmon Arms. Corliss Skains, MD, [facility] Surgery  General/ Trauma Surgery  09/30/2013 2:55 PM

## 2013-09-30 NOTE — Consult Note (Signed)
Subjective:  Feeling better. Afebrile. HR dropped to 40's in night.  Objective:  Vital Signs in the last 24 hours: Temp:  [98.3 F (36.8 C)-98.8 F (37.1 C)] 98.7 F (37.1 C) (10/28 0700) Pulse Rate:  [56-70] 63 (10/28 0722) Cardiac Rhythm:  [-] Normal sinus rhythm (10/28 0801) Resp:  [15-28] 15 (10/28 0722) BP: (101-156)/(62-84) 146/68 mmHg (10/28 0722) SpO2:  [92 %-99 %] 95 % (10/28 0757)  Physical Exam: BP Readings from Last 1 Encounters:  09/30/13 146/68     Wt Readings from Last 1 Encounters:  09/26/13 70.8 kg (156 lb 1.4 oz)    Weight change:   HEENT: [place]/AT, Eyes-Brown, PERL, EOMI, Conjunctiva-Pale pink, Sclera-Non-icteric Neck: No JVD, No bruit, Trachea midline. Lungs:  Decreased air entry at bases otherwise clear, bilateral. Cardiac:  Regular rhythm, normal S1 and S2, no S3. II/VI systolic murmur. Abdomen:  Soft, dressing on. Extremities:  No edema present. No cyanosis. No clubbing. CNS: AxOx3, Cranial nerves grossly intact, moves all 4 extremities. Right handed. Skin: Warm and dry. Psychiatric: Less anxious.   Intake/Output from previous day: 10/27 0701 - 10/28 0700 In: 2550 [I.V.:2300; IV Piggyback:250] Out: 1355 [Urine:1350; Drains:5]    Lab Results: BMET    Component Value Date/Time   NA 136 09/29/2013 0445   K 4.1 09/29/2013 0445   CL 101 09/29/2013 0445   CO2 27 09/29/2013 0445   GLUCOSE 86 09/29/2013 0445   BUN 12 09/29/2013 0445   CREATININE 1.01 09/29/2013 0445   CALCIUM 9.3 09/29/2013 0445   GFRNONAA 73* 09/29/2013 0445   GFRAA 85* 09/29/2013 0445   CBC    Component Value Date/Time   WBC 10.9* 09/29/2013 0445   RBC 3.48* 09/29/2013 0445   RBC 3.49* 01/04/2010 2355   HGB 9.5* 09/29/2013 0445   HCT 29.6* 09/29/2013 0445   PLT 235 09/29/2013 0445   MCV 85.1 09/29/2013 0445   MCH 27.3 09/29/2013 0445   MCHC 32.1 09/29/2013 0445   RDW 14.5 09/29/2013 0445   LYMPHSABS 2.1 09/26/2013 0952   MONOABS 1.0 09/26/2013 0952   EOSABS 0.0  09/26/2013 0952   BASOSABS 0.0 09/26/2013 0952   CARDIAC ENZYMES Lab Results  Component Value Date   CKTOTAL 26 05/02/2011   CKMB 0.9 05/02/2011   TROPONINI <0.30 06/30/2013    Scheduled Meds: . ipratropium  0.5 mg Nebulization Q6H WA   And  . albuterol  2.5 mg Nebulization Q6H WA  . chlorhexidine  15 mL Mouth/Throat BID  . diltiazem  240 mg Oral Daily  . enoxaparin (LOVENOX) injection  40 mg Subcutaneous Q24H  . metoprolol succinate  25 mg Oral QPM  . pantoprazole (PROTONIX) IV  40 mg Intravenous Q24H  . piperacillin-tazobactam (ZOSYN)  IV  3.375 g Intravenous Q8H  . Warfarin - Pharmacist Dosing Inpatient   Does not apply q1800   Continuous Infusions: . dextrose 5 % and 0.45 % NaCl with KCl 20 mEq/L 100 mL/hr at 09/30/13 0736   PRN Meds:.acetaminophen, albuterol, albuterol, fentaNYL, metoprolol, oxyCODONE  Assessment/Plan: Paroxysmal SVT  Paroxysmal atrial fibrillation  Frequent ventricular premature beats  S/P Sigmoid colon colostomy  S/P fall  S/P multiple rib fratures with hemothorax  CAD  COPD  Gout  Alcohol use disorder  Change metoprolol to once daily dosing and decrease dose to 25 mg. Daily. INR recheck and follow up in 1 week and 1 month.    LOS: 5 days    Orpah Cobb  MD  09/30/2013, 9:44 AM

## 2013-10-01 LAB — GLUCOSE, CAPILLARY: Glucose-Capillary: 102 mg/dL — ABNORMAL HIGH (ref 70–99)

## 2013-10-01 LAB — BASIC METABOLIC PANEL
BUN: 9 mg/dL (ref 6–23)
CO2: 27 mEq/L (ref 19–32)
Calcium: 8.8 mg/dL (ref 8.4–10.5)
GFR calc Af Amer: 81 mL/min — ABNORMAL LOW (ref >90)
GFR calc non Af Amer: 70 mL/min — ABNORMAL LOW (ref >90)
Glucose, Bld: 106 mg/dL — ABNORMAL HIGH (ref 70–99)
Potassium: 3.7 mEq/L (ref 3.5–5.1)
Sodium: 133 mEq/L — ABNORMAL LOW (ref 135–145)

## 2013-10-01 LAB — MAGNESIUM: Magnesium: 1.7 mg/dL (ref 1.5–2.5)

## 2013-10-01 LAB — PROTIME-INR: Prothrombin Time: 18.9 seconds — ABNORMAL HIGH (ref 11.6–15.2)

## 2013-10-01 MED ORDER — WARFARIN SODIUM 4 MG PO TABS
4.0000 mg | ORAL_TABLET | Freq: Once | ORAL | Status: AC
  Administered 2013-10-01: 4 mg via ORAL
  Filled 2013-10-01: qty 1

## 2013-10-01 NOTE — Progress Notes (Signed)
Will d/c abx tomorrow - 7 day course Beginning to have some flatus via ostomy Ileus beginning to resolve  Wilmon Arms. Corliss Skains, MD, [facility] Surgery  General/ Trauma Surgery  10/01/2013 3:44 PM

## 2013-10-01 NOTE — Progress Notes (Signed)
5 Days Post-Op  Subjective: Pt denies nausea, air in ostomy, no stool.  1250 on IS  Objective: Vital signs in last 24 hours: Temp:  [97.7 F (36.5 C)-99 F (37.2 C)] 98.2 F (36.8 C) (10/29 0736) Pulse Rate:  [60-69] 62 (10/29 0400) Resp:  [15-20] 15 (10/29 0400) BP: (134-150)/(61-71) 150/68 mmHg (10/29 0400) SpO2:  [93 %-98 %] 96 % (10/29 0805)    Intake/Output from previous day: 10/28 0701 - 10/29 0700 In: 1092.9 [I.V.:1042.9; IV Piggyback:50] Out: 1675 [Urine:1650; Drains:25] Intake/Output this shift: Total I/O In: -  Out: 125 [Urine:125]  General appearance: alert, cooperative, appears older than stated age and no distress Resp: clear to auscultation bilaterally Cardio: regular rate and rhythm, S1, S2 normal, no murmur, click, rub or gallop GI: +bs, abdomen is distended, midline incision-beefy red, with serosanguinous output.  Right inguinal hernia Extremities: extremities normal, atraumatic, no cyanosis or edema  Lab Results:   Recent Labs  09/29/13 0445  WBC 10.9*  HGB 9.5*  HCT 29.6*  PLT 235   BMET  Recent Labs  09/29/13 0445 10/01/13 0451  NA 136 133*  K 4.1 3.7  CL 101 100  CO2 27 27  GLUCOSE 86 106*  BUN 12 9  CREATININE 1.01 1.05  CALCIUM 9.3 8.8   PT/INR  Recent Labs  09/30/13 0557 10/01/13 0451  LABPROT 14.4 18.9*  INR 1.14 1.63*   ABG No results found for this basename: PHART, PCO2, PO2, HCO3,  in the last 72 hours  Studies/Results: No results found.  Anti-infectives: Anti-infectives   Start     Dose/Rate Route Frequency Ordered Stop   09/26/13 0600  piperacillin-tazobactam (ZOSYN) IVPB 3.375 g     3.375 g 12.5 mL/hr over 240 Minutes Intravenous 3 times per day 09/26/13 0344     09/26/13 0030  ertapenem (INVANZ) 1 g in sodium chloride 0.9 % 50 mL IVPB  Status:  Discontinued     1 g 100 mL/hr over 30 Minutes Intravenous  Once 09/26/13 0018 09/26/13 0344   09/25/13 2345  piperacillin-tazobactam (ZOSYN) IVPB 3.375 g     3.375 g 100 mL/hr over 30 Minutes Intravenous  Once 09/25/13 2343 09/26/13 0017      Assessment Abdominal pain with Sigmoid colon perforation from chicken bone.  S/p Exploratory laparotomy, Lysis of adhesions for 75 minutes, Partial sigmoid colectomy Colostomy10/24/2014, Liz Malady, MD  Post op ileus  S/p fall with multiple rib fx, hemothorax at SNF since 07/2013  CAD  Hypertension  COPD  Gout  Depression/Anxiety  Hx PAF, SVT 10/28 Hx alcoholic cirrhosis/alcoholic dependence/hepatitis B  PCM   Plan: he has good bowel sounds and is non tender, air in ostomy.  Start on sips of clears today and see if he is able to tolerate.  I think his liver disease contributes to abdominal distention.  If unable to tolerate, we will have to start TNA -daily wet to dry dressing changes, drain care, 16ml/24hr of serosanguinous output -ambulate, SCDs, lovenox -Zosyn started 10/23 will clarify duration -continue with IVF until he is tolerating orals  -appreciate cards consult -coumadin per pharmacy -transfer to floor with tele if okay with cardiology   LOS: 6 days    Sigrid Schwebach ANP-BC  10/01/2013 9:33 AM

## 2013-10-01 NOTE — Progress Notes (Signed)
ANTICOAGULATION CONSULT NOTE - Follow Up Consult  Pharmacy Consult for Coumadin Indication: atrial fibrillation  No Known Allergies  Patient Measurements: Height: 5' 6.5" (168.9 cm) Weight: 156 lb 1.4 oz (70.8 kg) IBW/kg (Calculated) : 64.95  Vital Signs: Temp: 98.8 F (37.1 C) (10/29 1213) Temp src: Oral (10/29 1213) BP: 150/86 mmHg (10/29 1213) Pulse Rate: 83 (10/29 1213)  Labs:  Recent Labs  09/29/13 0445 09/29/13 1430 09/30/13 0557 10/01/13 0451  HGB 9.5*  --   --   --   HCT 29.6*  --   --   --   PLT 235  --   --   --   LABPROT  --  13.7 14.4 18.9*  INR  --  1.07 1.14 1.63*  CREATININE 1.01  --   --  1.05    Estimated Creatinine Clearance: 60.2 ml/min (by C-G formula based on Cr of 1.05).  Assessment: 72yom POD#5 colectomy started on coumadin for PAF. INR remains subtherapeutic but moving nicely after first 2 doses of coumadin. Pt remains on lovenox 40mg  daily for VTE prophylaxis until INR > 2. No bleeding noted.  Goal of Therapy:  INR 2-3 Monitor platelets by anticoagulation protocol: Yes   Plan:  1) Coumadin 4mg  x 1 tonight 2) INR in AM 3) Continue lovenox 40mg  sq daily until INR at goal  Christoper Fabian, PharmD, BCPS Clinical pharmacist, pager [phone] 10/01/2013,1:13 PM

## 2013-10-01 NOTE — Consult Note (Signed)
Subjective:  Feeling better. T max 99 F. Monitor SR with PVCs.  Objective:  Vital Signs in the last 24 hours: Temp:  [97.7 F (36.5 C)-99 F (37.2 C)] 98.1 F (36.7 C) (10/29 1557) Pulse Rate:  [62-83] 83 (10/29 1213) Cardiac Rhythm:  [-] Normal sinus rhythm (10/29 1213) Resp:  [13-20] 18 (10/29 1213) BP: (139-153)/(61-86) 150/86 mmHg (10/29 1213) SpO2:  [93 %-98 %] 93 % (10/29 1410)  Physical Exam: BP Readings from Last 1 Encounters:  10/01/13 150/86     Wt Readings from Last 1 Encounters:  09/26/13 70.8 kg (156 lb 1.4 oz)    Weight change:   HEENT: [place]/AT, Eyes-Brown, PERL, EOMI, Conjunctiva-Pale pink, Sclera-Non-icteric Neck: No JVD, No bruit, Trachea midline. Lungs:  Clear, Bilateral. Cardiac:  Regular rhythm, normal S1 and S2, no S3. II/VI systolic murmur. Abdomen:  Soft, non-tender. Extremities:  No edema present. No cyanosis. No clubbing. Surgical dressing smaller and ostomy bag with air. CNS: AxOx3, Cranial nerves grossly intact, moves all 4 extremities. Right handed. Skin: Warm and dry.   Intake/Output from previous day: 10/28 0701 - 10/29 0700 In: 1092.9 [I.V.:1042.9; IV Piggyback:50] Out: 1675 [Urine:1650; Drains:25]    Lab Results: BMET    Component Value Date/Time   NA 133* 10/01/2013 0451   K 3.7 10/01/2013 0451   CL 100 10/01/2013 0451   CO2 27 10/01/2013 0451   GLUCOSE 106* 10/01/2013 0451   BUN 9 10/01/2013 0451   CREATININE 1.05 10/01/2013 0451   CALCIUM 8.8 10/01/2013 0451   GFRNONAA 70* 10/01/2013 0451   GFRAA 81* 10/01/2013 0451   CBC    Component Value Date/Time   WBC 10.9* 09/29/2013 0445   RBC 3.48* 09/29/2013 0445   RBC 3.49* 01/04/2010 2355   HGB 9.5* 09/29/2013 0445   HCT 29.6* 09/29/2013 0445   PLT 235 09/29/2013 0445   MCV 85.1 09/29/2013 0445   MCH 27.3 09/29/2013 0445   MCHC 32.1 09/29/2013 0445   RDW 14.5 09/29/2013 0445   LYMPHSABS 2.1 09/26/2013 0952   MONOABS 1.0 09/26/2013 0952   EOSABS 0.0 09/26/2013 0952   BASOSABS 0.0 09/26/2013 0952   CARDIAC ENZYMES Lab Results  Component Value Date   CKTOTAL 26 05/02/2011   CKMB 0.9 05/02/2011   TROPONINI <0.30 06/30/2013    Scheduled Meds: . ipratropium  0.5 mg Nebulization TID   And  . albuterol  2.5 mg Nebulization TID  . chlorhexidine  15 mL Mouth/Throat BID  . diltiazem  240 mg Oral Daily  . enoxaparin (LOVENOX) injection  40 mg Subcutaneous Q24H  . metoCLOPramide (REGLAN) injection  10 mg Intravenous Q6H  . metoprolol succinate  25 mg Oral QPM  . pantoprazole (PROTONIX) IV  40 mg Intravenous Q24H  . piperacillin-tazobactam (ZOSYN)  IV  3.375 g Intravenous Q8H  . warfarin  4 mg Oral ONCE-1800  . Warfarin - Pharmacist Dosing Inpatient   Does not apply q1800   Continuous Infusions: . dextrose 5 % and 0.45 % NaCl with KCl 20 mEq/L 75 mL/hr at 10/01/13 0500   PRN Meds:.acetaminophen, albuterol, albuterol, fentaNYL, metoprolol, ondansetron (ZOFRAN) IV, oxyCODONE  Assessment/Plan: Paroxysmal SVT  Paroxysmal atrial fibrillation  Frequent ventricular premature beats  S/P Sigmoid colon colostomy  S/P fall  S/P multiple rib fratures with hemothorax  CAD  COPD  Gout  Alcohol use disorder  OK to transfer.   LOS: 6 days    Orpah Cobb  MD  10/01/2013, 5:01 PM

## 2013-10-01 NOTE — Progress Notes (Signed)
Chaplain made initial visit. Patient was alert and sitting up in bed. The nurse was administering some pain medication. Patient initially said he was "aggravated," but then he said he didn't know why. He did say it was difficult having these tubes and attachments on his body and he was looking forward to having them removed. Patient explained that he didn't have any family around but that he had some good friends. When chaplain asked how she could support the patient, he replied, "just pray for me." Chaplain said a prayer for healing and community during this time.

## 2013-10-01 NOTE — Progress Notes (Signed)
CSW updated Angela from [facility].    CSW following for d/c planning.    Celes Dedic LCSWA   Hospital    

## 2013-10-02 DIAGNOSIS — I4891 Unspecified atrial fibrillation: Secondary | ICD-10-CM

## 2013-10-02 DIAGNOSIS — E46 Unspecified protein-calorie malnutrition: Secondary | ICD-10-CM

## 2013-10-02 LAB — PROTIME-INR
INR: 2.44 — ABNORMAL HIGH (ref 0.00–1.49)
Prothrombin Time: 25.7 seconds — ABNORMAL HIGH (ref 11.6–15.2)

## 2013-10-02 MED ORDER — WARFARIN VIDEO
Freq: Once | Status: AC
  Administered 2013-10-03: 12:00:00

## 2013-10-02 MED ORDER — PANTOPRAZOLE SODIUM 40 MG PO TBEC
40.0000 mg | DELAYED_RELEASE_TABLET | Freq: Every day | ORAL | Status: DC
  Administered 2013-10-03 – 2013-10-06 (×4): 40 mg via ORAL
  Filled 2013-10-02 (×4): qty 1

## 2013-10-02 MED ORDER — ENSURE COMPLETE PO LIQD
237.0000 mL | Freq: Three times a day (TID) | ORAL | Status: DC
  Administered 2013-10-02 – 2013-10-06 (×13): 237 mL via ORAL

## 2013-10-02 MED ORDER — WARFARIN SODIUM 1 MG PO TABS
1.0000 mg | ORAL_TABLET | Freq: Once | ORAL | Status: AC
  Administered 2013-10-02: 1 mg via ORAL
  Filled 2013-10-02: qty 1

## 2013-10-02 MED ORDER — COUMADIN BOOK
Freq: Once | Status: AC
  Administered 2013-10-02: 1
  Filled 2013-10-02: qty 1

## 2013-10-02 NOTE — Consult Note (Signed)
Subjective:  Feeling better. Tolerating food. Afebrile. HR in 70-80/min. INR-2.44  Objective:  Vital Signs in the last 24 hours: Temp:  [98.1 F (36.7 C)-98.8 F (37.1 C)] 98.1 F (36.7 C) (10/30 0800) Pulse Rate:  [68-83] 68 (10/30 0800) Cardiac Rhythm:  [-] Normal sinus rhythm (10/29 2000) Resp:  [16-21] 16 (10/30 0800) BP: (132-154)/(66-86) 144/77 mmHg (10/30 0800) SpO2:  [88 %-97 %] 90 % (10/30 0846)  Physical Exam: BP Readings from Last 1 Encounters:  10/02/13 144/77     Wt Readings from Last 1 Encounters:  09/26/13 70.8 kg (156 lb 1.4 oz)    Weight change:   HEENT: [place]/AT, Eyes-Brown, PERL, EOMI, Conjunctiva-Pale pink, Sclera-Non-icteric Neck: No JVD, No bruit, Trachea midline. Lungs:  Clear, Bilateral. Cardiac:  Regular rhythm, normal S1 and S2, no S3.  Abdomen:  Soft, non-tender. Extremities:  No edema present. No cyanosis. No clubbing. Surgical dressing RLQ and Colostomy bag LLQ. CNS: AxOx3, Cranial nerves grossly intact, moves all 4 extremities. Right handed. Skin: Warm and dry.   Intake/Output from previous day: 10/29 0701 - 10/30 0700 In: 520 [P.O.:520] Out: 1185 [Urine:1125; Drains:60]    Lab Results: BMET    Component Value Date/Time   NA 133* 10/01/2013 0451   K 3.7 10/01/2013 0451   CL 100 10/01/2013 0451   CO2 27 10/01/2013 0451   GLUCOSE 106* 10/01/2013 0451   BUN 9 10/01/2013 0451   CREATININE 1.05 10/01/2013 0451   CALCIUM 8.8 10/01/2013 0451   GFRNONAA 70* 10/01/2013 0451   GFRAA 81* 10/01/2013 0451   CBC    Component Value Date/Time   WBC 10.9* 09/29/2013 0445   RBC 3.48* 09/29/2013 0445   RBC 3.49* 01/04/2010 2355   HGB 9.5* 09/29/2013 0445   HCT 29.6* 09/29/2013 0445   PLT 235 09/29/2013 0445   MCV 85.1 09/29/2013 0445   MCH 27.3 09/29/2013 0445   MCHC 32.1 09/29/2013 0445   RDW 14.5 09/29/2013 0445   LYMPHSABS 2.1 09/26/2013 0952   MONOABS 1.0 09/26/2013 0952   EOSABS 0.0 09/26/2013 0952   BASOSABS 0.0 09/26/2013 0952    CARDIAC ENZYMES Lab Results  Component Value Date   CKTOTAL 26 05/02/2011   CKMB 0.9 05/02/2011   TROPONINI <0.30 06/30/2013    Scheduled Meds: . ipratropium  0.5 mg Nebulization TID   And  . albuterol  2.5 mg Nebulization TID  . chlorhexidine  15 mL Mouth/Throat BID  . diltiazem  240 mg Oral Daily  . enoxaparin (LOVENOX) injection  40 mg Subcutaneous Q24H  . metoCLOPramide (REGLAN) injection  10 mg Intravenous Q6H  . metoprolol succinate  25 mg Oral QPM  . pantoprazole (PROTONIX) IV  40 mg Intravenous Q24H  . piperacillin-tazobactam (ZOSYN)  IV  3.375 g Intravenous Q8H  . Warfarin - Pharmacist Dosing Inpatient   Does not apply q1800   Continuous Infusions: . dextrose 5 % and 0.45 % NaCl with KCl 20 mEq/L 75 mL/hr at 10/02/13 0600   PRN Meds:.acetaminophen, albuterol, albuterol, fentaNYL, metoprolol, ondansetron (ZOFRAN) IV, oxyCODONE  Assessment/Plan:  Paroxysmal SVT  Paroxysmal atrial fibrillation  Frequent ventricular premature beats  S/P Sigmoid colon colostomy  S/P fall  S/P multiple rib fratures with hemothorax  CAD  COPD  Gout  Alcohol use disorder  May discharge to [facility] when surgically stable with INR 2-3    LOS: 7 days    Zachary Cobb  MD  10/02/2013, 8:53 AM

## 2013-10-02 NOTE — Progress Notes (Signed)
ANTICOAGULATION CONSULT NOTE - Follow Up Consult  Pharmacy Consult for Coumadin Indication: atrial fibrillation  No Known Allergies  Patient Measurements: Height: 5' 6.5" (168.9 cm) Weight: 156 lb 1.4 oz (70.8 kg) IBW/kg (Calculated) : 64.95  Vital Signs: Temp: 98.1 F (36.7 C) (10/30 0800) Temp src: Oral (10/30 0800) BP: 144/77 mmHg (10/30 0800) Pulse Rate: 68 (10/30 0800)  Labs:  Recent Labs  09/30/13 0557 10/01/13 0451 10/02/13 0405  LABPROT 14.4 18.9* 25.7*  INR 1.14 1.63* 2.44*  CREATININE  --  1.05  --     Estimated Creatinine Clearance: 60.2 ml/min (by C-G formula based on Cr of 1.05).  Assessment: 72yom POD#6 colectomy started on coumadin for PAF. INR therapeutic with large jump past 24 hours. Pt still on lovenox 40mg  daily for VTE prophylaxis until INR > 2. No bleeding noted.  Goal of Therapy:  INR 2-3 Monitor platelets by anticoagulation protocol: Yes   Plan:  1) Coumadin 1mg  x 1 tonight 2) INR in AM 3) D/c lovenox with INR>2  Christoper Fabian, PharmD, BCPS Clinical pharmacist, pager [phone] 10/02/2013,9:51 AM

## 2013-10-02 NOTE — Care Management Note (Unsigned)
    Page 1 of 2   10/02/2013     8:54:59 AM   CARE MANAGEMENT NOTE 10/02/2013  Patient:  Zachary [name],Zachary [name]   Account Number:  [id number]  Date Initiated:  09/26/2013  Documentation initiated by:  Avie Arenas  Subjective/Objective Assessment:   Post op colectomy for free air in abdomen.     Action/Plan:   PTA pt from SNF- [facility]- CSW following for return to SNF   Anticipated DC Date:  10/02/2013   Anticipated DC Plan:  SKILLED NURSING FACILITY  In-house referral  Clinical Social Worker      DC Planning Services  CM consult      Choice offered to / List presented to:             Status of service:  In process, will continue to follow Medicare Important Message given?   (If response is "NO", the following Medicare IM given date fields will be blank) Date Medicare IM given:   Date Additional Medicare IM given:    Discharge Disposition:    Per UR Regulation:    If discussed at Long Length of Stay Meetings, dates discussed:   09/30/2013  10/02/2013    Comments:  Contact:  Wende Crease Friend [phone]   [facility] Sister [phone]  10/02/13 8:52 Letha Cape RN,BSN [phone] 5 days post op expl lap, lysis of adhesions, partial sigmoid colectomy, colostomy.  Post op ileus, started on sips of clears 10/29 if unable to tolerate will start tna. transfer to floor today. plan for snf at dc.   09/29/13- 1200- Donn Pierini RN, BSN [phone] Pt from [facility]- CSW following for return to [facility] when medically stable- pt day 3 post op exp lap with colostomy - NGT d/c'd today- ice chips started

## 2013-10-02 NOTE — Progress Notes (Signed)
NUTRITION FOLLOW UP  DOCUMENTATION CODES  Per approved criteria   -Severe malnutrition in the context of chronic illness    Intervention:    Ensure Complete PO TID, each supplement provides 350 kcal and 13 grams of protein.  Nutrition Dx:   Inadequate oral intake related to altered GI function as evidenced by slow diet advancement.  Goal:   Intake to meet >90% of estimated nutrition needs.  Monitor:   Diet advancement, PO intake, labs, weight trend.  Assessment:   Patient admitted on 10/23 with sigmoid colon perforation from chicken bone. S/P exploratory laparotomy with lysis of adhesions and partial sigmoid colectomy with colostomy on 10/24.   Diet has been advanced to full liquids. PO intake is good per RN. Will add PO supplements to maximize oral intake. Colostomy with serosanguinous drainage but no stool. Patient reports abdominal pain improved.  Height: Ht Readings from Last 1 Encounters:  09/25/13 5' 6.5" (1.689 m)    Weight Status:   Wt Readings from Last 1 Encounters:  09/26/13 156 lb 1.4 oz (70.8 kg)    Re-estimated needs:  Kcal: 1750-1950  Protein: 85-100 gm  Fluid: 1.8-2 L  Skin: abdominal incision  Diet Order: Full Liquid   Intake/Output Summary (Last 24 hours) at 10/02/13 1008 Last data filed at 10/02/13 0519  Gross per 24 hour  Intake    520 ml  Output    810 ml  Net   -290 ml    Labs:   Recent Labs Lab 09/28/13 0420 09/28/13 0950 09/29/13 0445 10/01/13 0451  NA 136  --  136 133*  K 4.5  --  4.1 3.7  CL 103  --  101 100  CO2 28  --  27 27  BUN 8  --  12 9  CREATININE 0.98  --  1.01 1.05  CALCIUM 9.2  --  9.3 8.8  MG  --  1.7  --  1.7  GLUCOSE 102*  --  86 106*    CBG (last 3)   Recent Labs  09/30/13 2202  GLUCAP 102*    Scheduled Meds: . ipratropium  0.5 mg Nebulization TID   And  . albuterol  2.5 mg Nebulization TID  . chlorhexidine  15 mL Mouth/Throat BID  . diltiazem  240 mg Oral Daily  . metoCLOPramide (REGLAN)  injection  10 mg Intravenous Q6H  . metoprolol succinate  25 mg Oral QPM  . pantoprazole (PROTONIX) IV  40 mg Intravenous Q24H  . piperacillin-tazobactam (ZOSYN)  IV  3.375 g Intravenous Q8H  . warfarin  1 mg Oral ONCE-1800  . Warfarin - Pharmacist Dosing Inpatient   Does not apply q1800    Continuous Infusions: . dextrose 5 % and 0.45 % NaCl with KCl 20 mEq/L 75 mL/hr at 10/02/13 0600    Joaquin Courts, RD, LDN, CNSC Pager [phone] After Hours Pager [phone]

## 2013-10-02 NOTE — Progress Notes (Signed)
Ostomy with significant flatus RIH - partially reducible  Transfer to floor.  Wilmon Arms. Corliss Skains, MD, [facility] Surgery  General/ Trauma Surgery  10/02/2013 11:06 AM

## 2013-10-02 NOTE — Progress Notes (Signed)
6 Days Post-Op  Subjective: Patient lying in bed watching TV.  Tolerating clear liquid diet.  Denies any N/V.  Admits to abdominal pain being "not as bad as it has been".    Objective: Vital signs in last 24 hours: Temp:  [98.1 F (36.7 C)-98.8 F (37.1 C)] 98.6 F (37 C) (10/30 0400) Pulse Rate:  [69-83] 69 (10/30 0400) Resp:  [18-21] 21 (10/30 0400) BP: (132-154)/(66-86) 132/66 mmHg (10/30 0400) SpO2:  [88 %-97 %] 96 % (10/30 0400)    Intake/Output from previous day: 10/29 0701 - 10/30 0700 In: 520 [P.O.:520] Out: 1185 [Urine:1125; Drains:60]    General appearance: alert and cooperative, appears older than stated age, no signs of acute distress Neuro: Oriented and conversing appropriately Resp: Rhonchi, Productive cough, thick white sputum, IS 1000-1250 Cardio: S1, S2 normal and no S3 or S4 GI: Abdomen slightly distended, +BS, some tenderness with palpation. Ostomy Site-Stoma pink in color, +air.  Right inguinal hernia GU: voiding Pulses: 2+ and symmetric Extremities: extremities normal, atraumatic, no cyanosis or edema Skin: Skin color, texture, turgor normal. Rash to upper back Incision/Wound: RLQ JP drain-serosanguinous drainage DSG C/D/I Midline Abdominal Incision-Wound bed red, with minimal serosanguinous output.   Lab Results:  No results found for this basename: WBC, HGB, HCT, PLT,  in the last 72 hours BMET  Recent Labs  10/01/13 0451  NA 133*  K 3.7  CL 100  CO2 27  GLUCOSE 106*  BUN 9  CREATININE 1.05  CALCIUM 8.8   PT/INR  Recent Labs  10/01/13 0451 10/02/13 0405  LABPROT 18.9* 25.7*  INR 1.63* 2.44*   ABG No results found for this basename: PHART, PCO2, PO2, HCO3,  in the last 72 hours  Studies/Results: No results found.  Anti-infectives: Anti-infectives   Start     Dose/Rate Route Frequency Ordered Stop   09/26/13 0600  piperacillin-tazobactam (ZOSYN) IVPB 3.375 g     3.375 g 12.5 mL/hr over 240 Minutes Intravenous 3 times per day  09/26/13 0344     09/26/13 0030  ertapenem (INVANZ) 1 g in sodium chloride 0.9 % 50 mL IVPB  Status:  Discontinued     1 g 100 mL/hr over 30 Minutes Intravenous  Once 09/26/13 0018 09/26/13 0344   09/25/13 2345  piperacillin-tazobactam (ZOSYN) IVPB 3.375 g     3.375 g 100 mL/hr over 30 Minutes Intravenous  Once 09/25/13 2343 09/26/13 0017       Assessment  Abdominal pain with Sigmoid colon perforation from chicken bone.  S/p Exploratory laparotomy, Lysis of adhesions for 75 minutes, Partial sigmoid colectomy Colostomy10/24/2014, Liz Malady, MD  Post op ileus  S/p fall with multiple rib fx, hemothorax at SNF since 07/2013  CAD  Hypertension  COPD  Gout  Depression/Anxiety  Hx PAF, SVT 10/28  Hx alcoholic cirrhosis/alcoholic dependence/hepatitis B  PCM    Plan:  -Patient has good bowel sounds.  He has been tolerating a clear liquid diet without difficulty.  Will advance his diet to full liquids and see how he does. NSL IVF -Continue wet to dry dressing to midline incision.  Ostomy has serosanguinous drainage but no stool as of yet.   -Patient has not had any cardiac events overnight. He is SR in the 80's per AM rhythm strip.  Per Cardiology (Dr. Algie Coffer) ok to transfer out of step-down.  Will transfer to 6N with telemetry. -Continue Coumadin per pharmacy -On Zosyn Day 7 -VTE-Lovenox, SCD's ambulate    LOS: 7 days  Norm Parcel, NP Student [facility] 10/02/2013

## 2013-10-03 MED ORDER — WARFARIN SODIUM 2.5 MG PO TABS
2.5000 mg | ORAL_TABLET | Freq: Once | ORAL | Status: AC
  Administered 2013-10-03: 2.5 mg via ORAL
  Filled 2013-10-03: qty 1

## 2013-10-03 NOTE — Progress Notes (Signed)
LOS: 8 days   Subjective: Patient feeling well today. No abdominal discomfort. Colostomy with serosanguinous drainage but no stool. Ostomy appears noninfected. JP drain with transparent yellow-tinted drainage. Tolerating full liquid diet well.  Afebrile. INR 2.32.  Objective: Vital signs in last 24 hours: Temp:  [98.2 F (36.8 C)-99.2 F (37.3 C)] 98.3 F (36.8 C) (10/31 0535) Pulse Rate:  [60-70] 60 (10/31 0535) Resp:  [16-20] 20 (10/31 0535) BP: (130-154)/(66-74) 149/66 mmHg (10/31 0535) SpO2:  [90 %-100 %] 100 % (10/31 0535) Last BM Date: 10/02/13   Laboratory  CBC No results found for this basename: WBC, HGB, HCT, PLT,  in the last 72 hours BMET  Recent Labs  10/01/13 0451  NA 133*  K 3.7  CL 100  CO2 27  GLUCOSE 106*  BUN 9  CREATININE 1.05  CALCIUM 8.8     Physical Exam General appearance: alert, cooperative and no distress Resp: diminished breath sounds LLL and wheezes bilaterally Cardio: regular rate and rhythm, S1, S2 normal, no murmur, click, rub or gallop GI: soft, non-tender; bowel sounds normal; no masses,  no organomegaly and bowel mildly distended, normoactive BS, no tenderness to palpation, air noted in the colostomy bag Extremities: extremities normal, atraumatic, no cyanosis or edema Skin: Skin color, texture, turgor normal. No rashes or lesions Incision/Wound: bandages remain in place. Colostomy site is non-infection.    Assessment/Plan: Paroxysmal SVT  Paroxysmal atrial fibrillation  Frequent ventricular premature beats  S/P Sigmoid colon colostomy  S/P fall  S/P multiple rib fratures with hemothorax  CAD  COPD  Gout  Alcohol use disorder  PLAN: Drainage serosanguinous 27ml/24hrs. Possible to remove JP tube today.  Wheezing give nebulized albuterol. Have PT consult. INR therapeutic, d/c lovenox  Advance diet. D/C Zosyn    Signed Britt Bottom, PA-S

## 2013-10-03 NOTE — Anesthesia Postprocedure Evaluation (Signed)
  Anesthesia Post-op Note  Patient: Zachary [name]  Procedure(s) Performed: Procedure(s): EXPLORATORY LAPAROTOMY (N/A)  Patient Location: PACU and Nursing Unit  Anesthesia Type:General  Level of Consciousness: awake, alert  and oriented  Airway and Oxygen Therapy: Patient Spontanous Breathing  Post-op Pain: mild  Post-op Assessment: Patient's Cardiovascular Status Stable and Respiratory Function Stable  Post-op Vital Signs: Reviewed and stable  Complications: No apparent anesthesia complications

## 2013-10-03 NOTE — Progress Notes (Signed)
JP drain out, pt tolerated well, dressing applied.

## 2013-10-03 NOTE — Consult Note (Signed)
Subjective:  Heart rate in 60-70/min. No new complaints.  Objective:  Vital Signs in the last 24 hours: Temp:  [97.8 F (36.6 C)-99.2 F (37.3 C)] 97.8 F (36.6 C) (10/31 1413) Pulse Rate:  [60-70] 66 (10/31 1413) Cardiac Rhythm:  [-] Junctional rhythm (10/30 2005) Resp:  [20] 20 (10/31 1413) BP: (130-150)/(66-72) 137/69 mmHg (10/31 1413) SpO2:  [93 %-100 %] 94 % (10/31 1413)  Physical Exam: BP Readings from Last 1 Encounters:  10/03/13 137/69     Wt Readings from Last 1 Encounters:  09/26/13 70.8 kg (156 lb 1.4 oz)    Weight change:   HEENT: [place]/AT, Eyes-Brown, PERL, EOMI, Conjunctiva-Pale pink, Sclera-Non-icteric Neck: No JVD, No bruit, Trachea midline. Lungs:  Clear, Bilateral. Cardiac:  Regular rhythm, normal S1 and S2, no S3. II/VI systolic murmur. Abdomen:  Soft, non-tender. Surgical dressing lower abdomen. Ostomy bag is empty. Extremities:  No edema present. No cyanosis. No clubbing. CNS: AxOx3, Cranial nerves grossly intact, moves all 4 extremities. Right handed. Skin: Warm and dry.   Intake/Output from previous day: 10/30 0701 - 10/31 0700 In: 1952.5 [P.O.:240; I.V.:1712.5] Out: 1905 [Urine:1825; Drains:30; Stool:50]    Lab Results: BMET    Component Value Date/Time   NA 133* 10/01/2013 0451   K 3.7 10/01/2013 0451   CL 100 10/01/2013 0451   CO2 27 10/01/2013 0451   GLUCOSE 106* 10/01/2013 0451   BUN 9 10/01/2013 0451   CREATININE 1.05 10/01/2013 0451   CALCIUM 8.8 10/01/2013 0451   GFRNONAA 70* 10/01/2013 0451   GFRAA 81* 10/01/2013 0451   CBC    Component Value Date/Time   WBC 10.9* 09/29/2013 0445   RBC 3.48* 09/29/2013 0445   RBC 3.49* 01/04/2010 2355   HGB 9.5* 09/29/2013 0445   HCT 29.6* 09/29/2013 0445   PLT 235 09/29/2013 0445   MCV 85.1 09/29/2013 0445   MCH 27.3 09/29/2013 0445   MCHC 32.1 09/29/2013 0445   RDW 14.5 09/29/2013 0445   LYMPHSABS 2.1 09/26/2013 0952   MONOABS 1.0 09/26/2013 0952   EOSABS 0.0 09/26/2013 0952   BASOSABS  0.0 09/26/2013 0952   CARDIAC ENZYMES Lab Results  Component Value Date   CKTOTAL 26 05/02/2011   CKMB 0.9 05/02/2011   TROPONINI <0.30 06/30/2013    Scheduled Meds: . ipratropium  0.5 mg Nebulization TID   And  . albuterol  2.5 mg Nebulization TID  . chlorhexidine  15 mL Mouth/Throat BID  . diltiazem  240 mg Oral Daily  . feeding supplement (ENSURE COMPLETE)  237 mL Oral TID BM  . metoCLOPramide (REGLAN) injection  10 mg Intravenous Q6H  . metoprolol succinate  25 mg Oral QPM  . pantoprazole  40 mg Oral Daily  . warfarin  2.5 mg Oral ONCE-1800  . Warfarin - Pharmacist Dosing Inpatient   Does not apply q1800   Continuous Infusions: . dextrose 5 % and 0.45 % NaCl with KCl 20 mEq/L 75 mL/hr at 10/03/13 0404   PRN Meds:.acetaminophen, albuterol, albuterol, fentaNYL, metoprolol, ondansetron (ZOFRAN) IV, oxyCODONE  Assessment/Plan:  Paroxysmal SVT  Paroxysmal atrial fibrillation  Frequent ventricular premature beats  S/P Sigmoid colon colostomy  S/P fall  S/P multiple rib fratures with hemothorax  CAD  COPD  Gout  Alcohol use disorder  Will follow as needed only.   LOS: 8 days    Orpah Cobb  MD  10/03/2013, 5:39 PM

## 2013-10-03 NOTE — Progress Notes (Signed)
Physical Therapy Treatment Patient Details Name: Bruin Bolger MRN: [id number] DOB: 05-08-1941 Today's Date: 10/03/2013 Time: 4098-1191 PT Time Calculation (min): 25 min  PT Assessment / Plan / Recommendation  History of Present Illness Adm for abd pain and found to have perforated bowel. Underwent emergency partial colectomy and colostomy   PT Comments   Patient demonstrates decreased activity tolerance during ambulation. Patient desaturated to 79% on 3 liters. Increased O2 to 4 liters patient still around 86% with activity and increased to 91 % at rest. Will continue to progress activity as tolerated. Educated on energy conservation and rest breaks. Patient demonstrates steady overall ambulation.   Follow Up Recommendations  SNF     Does the patient have the potential to tolerate intense rehabilitation     Barriers to Discharge        Equipment Recommendations  None recommended by PT    Recommendations for Other Services    Frequency Min 2X/week   Progress towards PT Goals Progress towards PT goals: Progressing toward goals  Plan Current plan remains appropriate    Precautions / Restrictions Precautions Precautions: Fall Restrictions Weight Bearing Restrictions: No   Pertinent Vitals/Pain No pain reported at this time    Mobility  Bed Mobility Bed Mobility: Not assessed Transfers Transfers: Sit to Stand;Stand to Sit Sit to Stand: 5: Supervision Stand to Sit: 5: Supervision Details for Transfer Assistance: VCs for hand placement initially Ambulation/Gait Ambulation/Gait Assistance: 5: Supervision Ambulation Distance (Feet): 410 Feet Assistive device: Rolling walker Ambulation/Gait Assistance Details: VCs for postioning within RW Gait Pattern: Step-through pattern (emerging step through gait) Gait velocity: decreased Stairs: No     PT Goals (current goals can now be found in the care plan section) Acute Rehab PT Goals PT Goal Formulation: With patient Time  For Goal Achievement: 10/03/13 Potential to Achieve Goals: Good  Visit Information  Last PT Received On: 10/03/13 Assistance Needed: +1 History of Present Illness: Adm for abd pain and found to have perforated bowel. Underwent emergency partial colectomy and colostomy    Subjective Data  Subjective: Pt agreeable to amb   Cognition  Cognition Arousal/Alertness: Awake/alert Behavior During Therapy: WFL for tasks assessed/performed    Balance  Static Sitting Balance Static Sitting - Balance Support: No upper extremity supported;Feet supported Static Sitting - Level of Assistance: 7: Independent Static Standing Balance Static Standing - Balance Support: No upper extremity supported Static Standing - Level of Assistance: 5: Stand by assistance  End of Session PT - End of Session Equipment Utilized During Treatment: Oxygen (had to increase to 4 liters with ambulation) Activity Tolerance: Patient tolerated treatment well Patient left: in chair;with call bell/phone within reach Nurse Communication: Mobility status   GP     Fabio Asa 10/03/2013, 2:14 PM Charlotte Crumb, PT DPT  [phone]

## 2013-10-03 NOTE — Progress Notes (Signed)
ANTICOAGULATION CONSULT NOTE - Follow Up Consult  Pharmacy Consult for Warfarin Indication: Atrial Fibrillation  No Known Allergies  Patient Measurements: Height: 5' 6.5" (168.9 cm) Weight: 156 lb 1.4 oz (70.8 kg) IBW/kg (Calculated) : 64.95  Vital Signs: Temp: 98.3 F (36.8 C) (10/31 0535) Temp src: Oral (10/31 0535) BP: 149/66 mmHg (10/31 0535) Pulse Rate: 60 (10/31 0535)  Labs:  Recent Labs  10/01/13 0451 10/02/13 0405 10/03/13 0627  LABPROT 18.9* 25.7* 24.7*  INR 1.63* 2.44* 2.32*  CREATININE 1.05  --   --     Estimated Creatinine Clearance: 60.2 ml/min (by C-G formula based on Cr of 1.05).   Assessment: 72 y.o. M who continues on warfarin for atrial fibrillation with a therapeutic INR this morning (INR 2.32 << 2.44, goal of 2-3). No CBC since 10/27 -- will recheck on 11/1 a.m. No overt s/sx of bleeding noted. The patient has been educated this admission.   Goal of Therapy:  INR 2-3   Plan:  1. Warfarin 2.5 mg x 1 dose at 1800 today 2. Will continue to monitor for any signs/symptoms of bleeding and will follow up with PT/INR in the a.m.   Georgina Pillion, PharmD, BCPS Clinical Pharmacist Pager: [phone] 10/03/2013 1:24 PM

## 2013-10-03 NOTE — Progress Notes (Signed)
Large amount of flatus in bag Drain is ready to come out  Advancing slowly.  Wilmon Arms. Corliss Skains, MD, [facility] Surgery  General/ Trauma Surgery  10/03/2013 12:38 PM

## 2013-10-04 LAB — CBC
Hemoglobin: 9.4 g/dL — ABNORMAL LOW (ref 13.0–17.0)
MCH: 27 pg (ref 26.0–34.0)
MCHC: 31.6 g/dL (ref 30.0–36.0)
WBC: 11.6 10*3/uL — ABNORMAL HIGH (ref 4.0–10.5)

## 2013-10-04 LAB — PROTIME-INR: Prothrombin Time: 20.6 seconds — ABNORMAL HIGH (ref 11.6–15.2)

## 2013-10-04 MED ORDER — WARFARIN SODIUM 4 MG PO TABS
4.0000 mg | ORAL_TABLET | Freq: Once | ORAL | Status: AC
  Administered 2013-10-04: 4 mg via ORAL
  Filled 2013-10-04: qty 1

## 2013-10-04 NOTE — Progress Notes (Signed)
ANTICOAGULATION CONSULT NOTE - Follow Up Consult  Pharmacy Consult for Warfarin Indication: Atrial Fibrillation  No Known Allergies  Patient Measurements: Height: 5' 6.5" (168.9 cm) Weight: 156 lb 1.4 oz (70.8 kg) IBW/kg (Calculated) : 64.95  Vital Signs: Temp: 98.6 F (37 C) (11/01 0525) Temp src: Oral (11/01 0525) BP: 145/60 mmHg (11/01 0525) Pulse Rate: 65 (11/01 0525)  Labs:  Recent Labs  10/02/13 0405 10/03/13 0627 10/04/13 0555  HGB  --   --  9.4*  HCT  --   --  29.7*  PLT  --   --  287  LABPROT 25.7* 24.7* 20.6*  INR 2.44* 2.32* 1.83*    Estimated Creatinine Clearance: 60.2 ml/min (by C-G formula based on Cr of 1.05).   Assessment: 72 y.o. M who continues on warfarin for atrial fibrillation with a slightly SUBtherapeutic INR this morning (INR 1.83 << 2.32, goal of 2-3). Hgb/Hct/Plt stable. No overt s/sx of bleeding noted. The patient has been educated this admission.   Goal of Therapy:  INR 2-3   Plan:  1. Warfarin 4 mg x 1 dose at 1800 today 2. Will continue to monitor for any signs/symptoms of bleeding and will follow up with PT/INR in the a.m.   Georgina Pillion, PharmD, BCPS Clinical Pharmacist Pager: [phone] 10/04/2013 12:29 PM

## 2013-10-04 NOTE — Progress Notes (Signed)
8 Days Post-Op   Assessment: s/p Procedure(s): EXPLORATORY LAPAROTOMY Patient Active Problem List   Diagnosis Date Noted  . Ileus, postoperative 09/29/2013  . PAF (paroxysmal atrial fibrillation) 09/29/2013  . Respiratory failure, post-operative 09/26/2013  . Perforation of sigmoid colon 09/26/2013  . Macrocytosis 08/25/2013  . Atrial fibrillation 08/12/2013  . Severe protein-calorie malnutrition 07/09/2013  . Pneumonia 07/09/2013  . Traumatic hemothorax 07/01/2013  . Acute blood loss anemia 07/01/2013  . Fall 06/23/2013  . Multiple fractures of ribs of left side 06/23/2013  . Alcohol dependence with acute alcoholic intoxication 02/26/2012    Class: Acute  . Alcohol dependence 10/17/2011  . HEPATITIS B 12/01/2008  . HYPERTENSION 12/01/2008  . COPD UNSPECIFIED 12/01/2008  . HEPATIC CIRRHOSIS, ALCOHOLIC 12/01/2008  . ARTHRITIS 12/01/2008  . HERNIA, HX OF 12/01/2008    Improving and should be able to go to SNF Monday as planned  Plan: Plan for d/c to SNF Monday  Subjective: Feels OK, no c/o likes solid diet, pain minimal, looking forward to discharge.   Objective: Vital signs in last 24 hours: Temp:  [97.8 F (36.6 C)-98.6 F (37 C)] 98.6 F (37 C) (11/01 0525) Pulse Rate:  [60-66] 65 (11/01 0525) Resp:  [16-20] 16 (11/01 0525) BP: (137-150)/(60-69) 145/60 mmHg (11/01 0525) SpO2:  [93 %-96 %] 96 % (11/01 0525)   Intake/Output from previous day: 10/31 0701 - 11/01 0700 In: 1306.3 [I.V.:1306.3] Out: 2995 [Urine:2925; Drains:20; Stool:50]  General appearance: alert, cooperative and mild distress Resp: clear to auscultation bilaterally GI: Abd soft and not tender, slightly distended  Incision: Dressing dry - just changed by nurse so didn't remove today. Will check it tomorrow  Lab Results:   Recent Labs  10/04/13 0555  WBC 11.6*  HGB 9.4*  HCT 29.7*  PLT 287   BMET No results found for this basename: NA, K, CL, CO2, GLUCOSE, BUN, CREATININE, CALCIUM,   in the last 72 hours  MEDS, Scheduled . ipratropium  0.5 mg Nebulization TID   And  . albuterol  2.5 mg Nebulization TID  . chlorhexidine  15 mL Mouth/Throat BID  . diltiazem  240 mg Oral Daily  . feeding supplement (ENSURE COMPLETE)  237 mL Oral TID BM  . metoCLOPramide (REGLAN) injection  10 mg Intravenous Q6H  . metoprolol succinate  25 mg Oral QPM  . pantoprazole  40 mg Oral Daily  . Warfarin - Pharmacist Dosing Inpatient   Does not apply q1800    Studies/Results: No results found.    LOS: 9 days     Currie Paris, MD, [facility] Surgery, [place] [phone]   10/04/2013 9:31 AM

## 2013-10-05 LAB — PROTIME-INR: Prothrombin Time: 19.5 seconds — ABNORMAL HIGH (ref 11.6–15.2)

## 2013-10-05 MED ORDER — WARFARIN SODIUM 4 MG PO TABS
4.0000 mg | ORAL_TABLET | Freq: Once | ORAL | Status: AC
  Administered 2013-10-05: 4 mg via ORAL
  Filled 2013-10-05 (×3): qty 1

## 2013-10-05 NOTE — Progress Notes (Signed)
9 Days Post-Op   Assessment: s/p Procedure(s): EXPLORATORY LAPAROTOMY Patient Active Problem List   Diagnosis Date Noted  . Ileus, postoperative 09/29/2013  . PAF (paroxysmal atrial fibrillation) 09/29/2013  . Respiratory failure, post-operative 09/26/2013  . Perforation of sigmoid colon 09/26/2013  . Macrocytosis 08/25/2013  . Atrial fibrillation 08/12/2013  . Severe protein-calorie malnutrition 07/09/2013  . Pneumonia 07/09/2013  . Traumatic hemothorax 07/01/2013  . Acute blood loss anemia 07/01/2013  . Fall 06/23/2013  . Multiple fractures of ribs of left side 06/23/2013  . Alcohol dependence with acute alcoholic intoxication 02/26/2012    Class: Acute  . Alcohol dependence 10/17/2011  . HEPATITIS B 12/01/2008  . HYPERTENSION 12/01/2008  . COPD UNSPECIFIED 12/01/2008  . HEPATIC CIRRHOSIS, ALCOHOLIC 12/01/2008  . ARTHRITIS 12/01/2008  . HERNIA, HX OF 12/01/2008    Stable, await SNF  Plan: Plan for discharge tomorrow  Subjective: Feels OK and no c/o tolerating diet, not much pain, ostomy working, looking forward to leaving hospital  Objective: Vital signs in last 24 hours: Temp:  [97.2 F (36.2 C)-99 F (37.2 C)] 98.4 F (36.9 C) (11/02 0550) Pulse Rate:  [56-62] 56 (11/02 0550) Resp:  [18-20] 20 (11/02 0550) BP: (141-149)/(61-78) 141/75 mmHg (11/02 0550) SpO2:  [96 %-100 %] 98 % (11/02 0550)   Intake/Output from previous day: 11/01 0701 - 11/02 0700 In: 2381.3 [P.O.:600; I.V.:1781.3] Out: 2850 [Urine:2800; Stool:50]  General appearance: alert, cooperative and no distress GI: Soft, not tender  Incision: wound open, clean and appears to be healing OK  Lab Results:   Recent Labs  10/04/13 0555  WBC 11.6*  HGB 9.4*  HCT 29.7*  PLT 287   BMET No results found for this basename: NA, K, CL, CO2, GLUCOSE, BUN, CREATININE, CALCIUM,  in the last 72 hours  MEDS, Scheduled . ipratropium  0.5 mg Nebulization TID   And  . albuterol  2.5 mg Nebulization  TID  . chlorhexidine  15 mL Mouth/Throat BID  . diltiazem  240 mg Oral Daily  . feeding supplement (ENSURE COMPLETE)  237 mL Oral TID BM  . metoCLOPramide (REGLAN) injection  10 mg Intravenous Q6H  . metoprolol succinate  25 mg Oral QPM  . pantoprazole  40 mg Oral Daily  . Warfarin - Pharmacist Dosing Inpatient   Does not apply q1800    Studies/Results: No results found.    LOS: 10 days     Currie Paris, MD, [facility] Surgery, [place] [phone]   10/05/2013 9:03 AM

## 2013-10-05 NOTE — Progress Notes (Signed)
ANTICOAGULATION CONSULT NOTE - Follow Up Consult  Pharmacy Consult for Warfarin Indication: Atrial Fibrillation  No Known Allergies  Patient Measurements: Height: 5' 6.5" (168.9 cm) Weight: 156 lb 1.4 oz (70.8 kg) IBW/kg (Calculated) : 64.95  Vital Signs: Temp: 98.4 F (36.9 C) (11/02 0550) Temp src: Oral (11/02 0550) BP: 141/75 mmHg (11/02 0550) Pulse Rate: 56 (11/02 0550)  Labs:  Recent Labs  10/03/13 0627 10/04/13 0555 10/05/13 0621  HGB  --  9.4*  --   HCT  --  29.7*  --   PLT  --  287  --   LABPROT 24.7* 20.6* 19.5*  INR 2.32* 1.83* 1.70*    Estimated Creatinine Clearance: 60.2 ml/min (by C-G formula based on Cr of 1.05).   Assessment: 72 y.o. M who continues on warfarin for atrial fibrillation with a slightly SUBtherapeutic INR this morning (INR 1.7 << 1.83, goal of 2-3). Hgb/Hct/Plt stable. No overt s/sx of bleeding noted. The patient has been educated this admission.   Goal of Therapy:  INR 2-3   Plan:  1. Warfarin 4 mg x 1 dose at 1800 today 2. Consider restarting lovenox 40 mg SQ for VTE prophylaxis while INR <2 3. Will continue to monitor for any signs/symptoms of bleeding and will follow up with PT/INR in the a.m.   Georgina Pillion, PharmD, BCPS Clinical Pharmacist Pager: [phone] 10/05/2013 9:04 AM

## 2013-10-06 LAB — PROTIME-INR: INR: 1.83 — ABNORMAL HIGH (ref 0.00–1.49)

## 2013-10-06 MED ORDER — WARFARIN SODIUM 5 MG PO TABS: 5.0000 mg | ORAL_TABLET | Freq: Once | ORAL | Status: DC

## 2013-10-06 MED ORDER — ENOXAPARIN SODIUM 40 MG/0.4ML ~~LOC~~ SOLN
40.0000 mg | SUBCUTANEOUS | Status: DC
  Administered 2013-10-06: 40 mg via SUBCUTANEOUS
  Filled 2013-10-06: qty 0.4

## 2013-10-06 MED ORDER — ENOXAPARIN SODIUM 40 MG/0.4ML ~~LOC~~ SOLN: 40.0000 mg | SUBCUTANEOUS | Status: DC

## 2013-10-06 MED ORDER — OXYCODONE HCL 5 MG/5ML PO SOLN: 5.0000 mg | ORAL | Status: DC | PRN

## 2013-10-06 MED ORDER — WARFARIN SODIUM 5 MG PO TABS
5.0000 mg | ORAL_TABLET | Freq: Once | ORAL | Status: DC
  Filled 2013-10-06: qty 1

## 2013-10-06 MED ORDER — ACETAMINOPHEN 160 MG/5ML PO SOLN: 650.0000 mg | ORAL | Status: DC | PRN

## 2013-10-06 MED ORDER — METOPROLOL SUCCINATE ER 25 MG PO TB24: 25.0000 mg | ORAL_TABLET | Freq: Every evening | ORAL | Status: AC

## 2013-10-06 NOTE — Discharge Summary (Signed)
Physician Discharge Summary  Zachary [name] NWG:[id number] DOB: 06-Oct-1941 DOA: 09/25/2013  PCP: Aura Dials, MD  Consultation: Cardiology(Dr. Orpah Cobb)    Critical Care  Admit date: 09/25/2013 Discharge date: 10/06/2013  Recommendations for Outpatient Follow-up:  Follow-up Information   Follow up with Aura Dials, MD.   Specialty:  Family Medicine   Contact information:   [address] [place] [address] [phone]       Follow up with [facility] S, MD. Schedule an appointment as soon as possible for a visit in 1 month. (INR recheck and f/u)    Specialty:  Cardiology   Contact information:   [address] [place] [address] [phone]       Follow up with Liz Malady, MD On 10/22/2013. (APPOINTMENT TIME: 9:50AM, ARRIVE EARLY)    Specialty:  General Surgery   Contact information:   [address] [place] [address] [phone]      Discharge Diagnoses:  1. Sigmoid perforation 2/2 chicken bone 2. Post operative respiratory failure 3. Peritonitis  4. Paroxysmal atrial fibrillation 5. Copd 6. CAD 7. Hypertension 8. Anemia 9. Depression  10. Alcohol abuse 11. Cirrhosis    Surgical Procedure: Exploratory Laparotomy with lysis of adhesions, partial sigmoid colectomy, colostomy Dr. Violeta Gelinas 09/26/13   Discharge Condition: stable Disposition: SNF  Diet recommendation: high fiber  Filed Weights   09/25/13 2015 09/26/13 0415  Weight: 145 lb (65.772 kg) 156 lb 1.4 oz (70.8 kg)     Filed Vitals:   10/06/13 0500  BP: 114/60  Pulse: 56  Temp: 98.3 F (36.8 C)  Resp: 20     Hospital Course:  [name] is a 72 year old male recently admitted under our trauma service due to a fall with multiple left sided rib fractures, alcohol withdraw, VATS procedure was done during this admission.  He was discharged and readmitted on 09/26/13 with a bowel perforation secondary to a chicken bone.  He  underwent a exploratory laparotomy urgently.  Post operatively, he developed respiratory failure and CCM was consulted for vent management.  Post operatively, he was kept NPO, NGT suction.  He was treated with IV antibiotics for the peritonitis.  He was extubated the following day and transferred to SDU.  He was mobilized.  The NGT was removed when bowel function returned/ileus resolved.  His wound was open and required BID wet to dry dressing changes.  Due to history of PAF, cardiology was consulted to help with medication and anticoagulation.  His medication were changed and coumadin was started for a goal INR of 2-3.  His vital signs remained stable.  White count trended down. He was started on a diet and tolerating well.  His ostomy had adequate output.  His INR this morning was subtherapeutic at 1.83 and therefore lovenox was restarted.  This needs to be stopped when the INR >2. His wound is clean, it needs to be changed BID, normal saline dressing changes.  His ostomy is pink and viable, this needs to be changed every 3 days.  He can mobilize as tolerated.  He is scheduled to follow up with Dr. [name] on the 19th for a post operative check.  He needs to follow up with his cardiologist as well.   Physical Exam: General appearance: alert, cooperative, appears older than stated age and no distress  Resp: clear to auscultation bilaterally  Cardio: regular rate and rhythm, S1, S2 normal, no murmur, click, rub or gallop  GI: +bs,  abdomen is non distended.  He has a large reducible ventral and right groin hernia.  Midline incision is beefy red, there is minimal fibrinous exudate, no signs of infection.  Stoma is pink and viable.  Liquid stool in ostomy bag. Extremities: extremities normal, atraumatic, no cyanosis or edema     DRESSING CHANGE Bid normal saline wet to dry dressing changes Ostomy to be changed every 3 days   Discharge Instructions   Future Appointments Provider Department Dept Phone    10/13/2013 8:30 AM Louis Meckel, MD [facility] [phone]   10/22/2013 9:50 AM Liz Malady, MD [facility], [place] [phone]       Medication List    STOP taking these medications       metoprolol tartrate 25 MG tablet  Commonly known as:  LOPRESSOR     naproxen 500 MG tablet  Commonly known as:  NAPROSYN      TAKE these medications       acetaminophen 160 MG/5ML solution  Commonly known as:  TYLENOL  Take 20.3 mLs (650 mg total) by mouth every 4 (four) hours as needed.     albuterol 108 (90 BASE) MCG/ACT inhaler  Commonly known as:  PROVENTIL HFA;VENTOLIN HFA  Inhale 2 puffs into the lungs every 6 (six) hours as needed for wheezing.     diltiazem 120 MG 24 hr capsule  Commonly known as:  DILACOR XR  Take 1 capsule (120 mg total) by mouth daily.     dronabinol 2.5 MG capsule  Commonly known as:  MARINOL  Take 1 capsule (2.5 mg total) by mouth 2 (two) times daily before lunch and supper.     enoxaparin 40 MG/0.4ML injection  Commonly known as:  LOVENOX  Inject 0.4 mLs (40 mg total) into the skin daily. PLEASE STOP WHEN INR IS >2     feeding supplement (PRO-STAT SUGAR FREE 64) Liqd  Take 30 mLs by mouth 2 (two) times daily.     gabapentin 100 MG capsule  Commonly known as:  NEURONTIN  Take 100 mg by mouth 3 (three) times daily.     metoprolol succinate 25 MG 24 hr tablet  Commonly known as:  TOPROL-XL  Take 1 tablet (25 mg total) by mouth every evening.     oxyCODONE 5 MG/5ML solution  Commonly known as:  ROXICODONE  Take 5-10 mLs (5-10 mg total) by mouth every 4 (four) hours as needed.     pantoprazole 40 MG tablet  Commonly known as:  PROTONIX  Take 40 mg by mouth daily.     traMADol 50 MG tablet  Commonly known as:  ULTRAM  Take 50-100 mg by mouth every 6 (six) hours as needed for pain.     traZODone 50 MG tablet  Commonly known as:  DESYREL  Take 50-100 mg by mouth at bedtime as needed for sleep. *takes  an extra 50mg  if need help sleeping     Vitamin D (Ergocalciferol) 50000 UNITS Caps capsule  Commonly known as:  DRISDOL  Take 50,000 Units by mouth every 30 (thirty) days.     warfarin 5 MG tablet  Commonly known as:  COUMADIN  Take 1 tablet (5 mg total) by mouth one time only at 6 PM.           Follow-up Information   Follow up with Aura Dials, MD.   Specialty:  Family Medicine   Contact information:   [address] [place] [address] [phone]  Follow up with [facility] S, MD. Schedule an appointment as soon as possible for a visit in 1 month. (INR recheck and f/u)    Specialty:  Cardiology   Contact information:   [address] [place] [address] [phone]       Follow up with Liz Malady, MD On 10/22/2013. (APPOINTMENT TIME: 9:50AM, ARRIVE EARLY)    Specialty:  General Surgery   Contact information:   [address] [place] [address] [phone]        The results of significant diagnostics from this hospitalization (including imaging, microbiology, ancillary and laboratory) are listed below for reference.    Significant Diagnostic Studies: Ct Abdomen Pelvis W Contrast  09/25/2013   CLINICAL DATA:  Diffuse abdominal pain.  EXAM: CT ABDOMEN AND PELVIS WITH CONTRAST  TECHNIQUE: Multidetector CT imaging of the abdomen and pelvis was performed using the standard protocol following bolus administration of intravenous contrast.  CONTRAST:  OMNIPAQUE IOHEXOL 300 MG/ML  SOLN  COMPARISON:  06/30/2013  FINDINGS: Lung bases demonstrate interval resolution or evacuation of the previously demonstrated hemoperitoneum with residual scarring or atelectasis in both lung bases. Small residual left pleural effusion. Mild cardiac enlargement. Old right rib fractures.  Since the previous study, there is interval development of free intra-abdominal air and of subcutaneous emphysema in the anterior abdominal wall.  There is an abnormal thick-walled bowel loop in the pelvis with free air in the adjacent mesentery and mesenteric infiltration. The no contrast extravasation is noted. Findings are worrisome for bowel necrosis and perforation. Small mesenteric abscess measuring about 2.3 cm diameter. No portal venous gas or pneumatosis.  Heterogeneous low-attenuation lesion in the posterior segment right lobe of the liver measures approximately 2.7 cm diameter. This is not have typical features of a cavernous hemangioma, although there is fill-in on the delayed imaging. The lesion has a cystic appearance on the previous study. This could represent atypical hemangioma possibly hemorrhage within a cyst. Metastasis is not entirely excluded if the patient has history of primary cancer. No other focal liver lesions are demonstrated. Cholelithiasis without gallbladder wall thickening. No bile duct dilatation. Pancreas, spleen, adrenal glands, kidneys, inferior vena cava, and retroperitoneal lymph nodes are unremarkable. Calcification of the abdominal aorta without aneurysm. Minimal calcification of the origin of the superior mesenteric artery. No free fluid in the pelvis as that no free fluid in the abdomen. Multiple anterior abdominal wall hernias containing fat and transverse colon. No obstruction is suggested. The stomach is decompressed. Stool-filled colon without distention.  Pelvis: There is a moderate size right inguinal hernia containing fat. Gas collections are present in the hernia which may be arising from the abdomen although infectious process is not excluded. The appendix is normal in extends partially into the hernia. Diffuse bladder wall thickening suggesting cystitis or hypertrophy. Colonic diverticula without. The the abscess and abnormal small bowel loop are adjacent to the sigmoid colon but there appears to be intervening fat. A perforated colonic diverticulitis with reactive inflammation of the small bowel loop could  also potentially have this appearance. No significant pelvic lymphadenopathy. Degenerative changes in the lumbar spine. No destructive bone lesions are appreciated.  IMPRESSION: Free intra-abdominal air. Thick-walled bowel loop in the pelvis with adjacent abscess and gas in the mesentery. The findings are worrisome for bowel ischemia with perforation although inflammatory process such as perforated diverticulitis could also have this appearance. Multiple anterior abdominal wall hernias are similar to previous study. Indeterminate lesion in the right lobe  of the liver. Cholelithiasis.  Results were discussed by telephone with Dr. Bebe Shaggy at 2332 hr on 09/25/2013.   Electronically Signed   By: Burman Nieves M.D.   On: 09/25/2013 23:38   Dg Chest Port 1 View  09/26/2013   CLINICAL DATA:  Intubated patient.  Evaluate endotracheal tube.  EXAM: PORTABLE CHEST - 1 VIEW  COMPARISON:  09/25/2013  FINDINGS: Endotracheal tube is 5.3 cm above the carina. Nasogastric tube extends into the abdomen. Again noted are healing left rib fractures. Few densities at the lung bases are suggestive for atelectasis. Negative for airspace disease or edema. Heart size is normal.  IMPRESSION: Support apparatuses as described.  No focal chest disease.   Electronically Signed   By: Richarda Overlie M.D.   On: 09/26/2013 07:43   Dg Chest Portable 1 View  09/25/2013   CLINICAL DATA:  Fever, smoker  EXAM: PORTABLE CHEST - 1 VIEW  COMPARISON:  07/13/2013  FINDINGS: Normal heart size and vascularity. Monitor leads overlie the chest. Minor basilar atelectasis versus scarring. Negative for CHF or pneumonia. No large effusion or pneumothorax. Left costophrenic angle is incompletely visualized. Trachea is midline. Overall stable exam.  IMPRESSION: No acute chest process. Stable exam.   Electronically Signed   By: Ruel Favors M.D.   On: 09/25/2013 21:11    Microbiology: No results found for this or any previous visit (from the past 240  hour(s)).   Labs: Basic Metabolic Panel:  Recent Labs Lab 10/01/13 0451  NA 133*  K 3.7  CL 100  CO2 27  GLUCOSE 106*  BUN 9  CREATININE 1.05  CALCIUM 8.8  MG 1.7   CBC:  Recent Labs Lab 10/04/13 0555  WBC 11.6*  HGB 9.4*  HCT 29.7*  MCV 85.3  PLT 287   CBG:  Recent Labs Lab 09/30/13 2202  GLUCAP 102*    Principal Problem:   Perforation of sigmoid colon Active Problems:   HYPERTENSION   COPD UNSPECIFIED   HEPATIC CIRRHOSIS, ALCOHOLIC   Acute blood loss anemia   Respiratory failure, post-operative   Ileus, postoperative   PAF (paroxysmal atrial fibrillation)   Time coordinating discharge: <30 mins  Signed:  Yaroslav Gombos, ANP-BC

## 2013-10-06 NOTE — Progress Notes (Signed)
Patient discharged to SNF in stable condition.  

## 2013-10-06 NOTE — Consult Note (Signed)
WOC has followed patient peripherally, this patient has not expressed interest in learning to care for his ostomy.  He will be discharged to a SNF for care.   Davina Poke RN,CWOCN

## 2013-10-06 NOTE — Progress Notes (Signed)
ANTICOAGULATION CONSULT NOTE - Follow Up Consult  Pharmacy Consult for Warfarin Indication: Atrial Fibrillation  No Known Allergies  Patient Measurements: Height: 5' 6.5" (168.9 cm) Weight: 156 lb 1.4 oz (70.8 kg) IBW/kg (Calculated) : 64.95  Vital Signs: Temp: 98.3 F (36.8 C) (11/03 0500) Temp src: Oral (11/03 0500) BP: 114/60 mmHg (11/03 0500) Pulse Rate: 56 (11/03 0500)  Labs:  Recent Labs  10/04/13 0555 10/05/13 0621 10/06/13 0446  HGB 9.4*  --   --   HCT 29.7*  --   --   PLT 287  --   --   LABPROT 20.6* 19.5* 20.6*  INR 1.83* 1.70* 1.83*    Estimated Creatinine Clearance: 60.2 ml/min (by C-G formula based on Cr of 1.05).   Assessment: 72 y.o. M who continues on warfarin for atrial fibrillation. INR this morning still remains slightly SUBtherapeutic at 1.83. Hgb/Hct/Plt stable. No overt s/sx of bleeding noted.  Goal of Therapy:  INR 2-3   Plan:  1. Warfarin 5 mg x 1 today 2. Consider restarting Lovenox 40 mg SQ for VTE prophylaxis while INR <2 3. Will continue to monitor for any signs/symptoms of bleeding 4. Daily PT/INR while in the hospital 5. If discharged to SNF today, recommend warfarin 5mg  daily until next INR check- recommend checking Thursday 11/6.  Louisa Favaro D. Sammy Douthitt, PharmD Clinical Pharmacist Pager: [phone] 10/06/2013 8:37 AM

## 2013-10-06 NOTE — Discharge Summary (Signed)
I have seen and examined the patient and agree with the assessment and plans.  Elijah Phommachanh A. Kurk Corniel  MD, FACS  

## 2013-10-07 ENCOUNTER — Non-Acute Institutional Stay (SKILLED_NURSING_FACILITY): Payer: Medicare Other | Admitting: Internal Medicine

## 2013-10-07 ENCOUNTER — Other Ambulatory Visit: Payer: Self-pay | Admitting: *Deleted

## 2013-10-07 DIAGNOSIS — J449 Chronic obstructive pulmonary disease, unspecified: Secondary | ICD-10-CM

## 2013-10-07 DIAGNOSIS — I4891 Unspecified atrial fibrillation: Secondary | ICD-10-CM

## 2013-10-07 DIAGNOSIS — K631 Perforation of intestine (nontraumatic): Secondary | ICD-10-CM

## 2013-10-07 DIAGNOSIS — I2510 Atherosclerotic heart disease of native coronary artery without angina pectoris: Secondary | ICD-10-CM

## 2013-10-07 MED ORDER — OXYCODONE HCL 5 MG/5ML PO SOLN: 5.0000 mg | ORAL | Status: DC | PRN

## 2013-10-13 ENCOUNTER — Ambulatory Visit (INDEPENDENT_AMBULATORY_CARE_PROVIDER_SITE_OTHER): Payer: Medicare Other | Admitting: Gastroenterology

## 2013-10-13 ENCOUNTER — Encounter: Payer: Self-pay | Admitting: Gastroenterology

## 2013-10-13 VITALS — BP 124/60 | HR 58 | Ht 66.5 in | Wt 147.0 lb

## 2013-10-13 DIAGNOSIS — K4090 Unilateral inguinal hernia, without obstruction or gangrene, not specified as recurrent: Secondary | ICD-10-CM | POA: Insufficient documentation

## 2013-10-13 NOTE — Progress Notes (Signed)
History of Present Illness: 72 year old white male with history of coronary artery disease, COPD, atrial fibrillation, on Coumadin, here for evaluation of a hernia.  He's had right inguinal hernia for quite some time.  She complains of pain in the area.  He recently underwent exploratory laparotomy for a sigmoid perforation secondary to chicken bone.  A colostomy is in place.    Past Medical History  Diagnosis Date  . Hypertension   . Coronary artery disease   . Hyperlipemia   . Angina   . Shortness of breath   . Mental disorder   . Arthritis   . COPD (chronic obstructive pulmonary disease)   . Hiatal hernia   . Depression   . Gout   . Joint pain   . Depression   . Anxiety   . Ileus, postoperative 09/29/2013  . PAF (paroxysmal atrial fibrillation) 09/29/2013    On lopressor and Cardizem at home, no anticoagulation.   Past Surgical History  Procedure Laterality Date  . Abdominal surgery    . Laparotomy N/A 09/26/2013    Procedure: EXPLORATORY LAPAROTOMY;  Surgeon: Liz Malady, MD;  Location: [facility] OR;  Service: General;  Laterality: N/A;   family history is not on file. Current Outpatient Prescriptions  Medication Sig Dispense Refill  . acetaminophen (TYLENOL) 160 MG/5ML solution Take 20.3 mLs (650 mg total) by mouth every 4 (four) hours as needed.  120 mL  0  . albuterol (PROVENTIL HFA;VENTOLIN HFA) 108 (90 BASE) MCG/ACT inhaler Inhale 2 puffs into the lungs every 6 (six) hours as needed for wheezing.      . Amino Acids-Protein Hydrolys (FEEDING SUPPLEMENT, PRO-STAT SUGAR FREE 64,) LIQD Take 30 mLs by mouth 2 (two) times daily.      Marland Kitchen diltiazem (DILACOR XR) 120 MG 24 hr capsule Take 1 capsule (120 mg total) by mouth daily.      Marland Kitchen dronabinol (MARINOL) 2.5 MG capsule Take 1 capsule (2.5 mg total) by mouth 2 (two) times daily before lunch and supper.      . enoxaparin (LOVENOX) 40 MG/0.4ML injection Inject 0.4 mLs (40 mg total) into the skin daily. PLEASE STOP WHEN INR IS >2  0  Syringe    . gabapentin (NEURONTIN) 100 MG capsule Take 100 mg by mouth 3 (three) times daily.      . metoprolol succinate (TOPROL-XL) 25 MG 24 hr tablet Take 1 tablet (25 mg total) by mouth every evening.      . Misc Natural Products (PROSTATE SUPPORT) 300-15 MG TABS Take by mouth 2 (two) times daily.      Marland Kitchen oxyCODONE (ROXICODONE) 5 MG/5ML solution Take 5-10 mLs (5-10 mg total) by mouth every 4 (four) hours as needed.  50 mL  0  . pantoprazole (PROTONIX) 40 MG tablet Take 40 mg by mouth daily.      . traMADol (ULTRAM) 50 MG tablet Take 50-100 mg by mouth every 6 (six) hours as needed for pain.      . traZODone (DESYREL) 50 MG tablet Take 50-100 mg by mouth at bedtime as needed for sleep. *takes an extra 50mg  if need help sleeping      . Vitamin D, Ergocalciferol, (DRISDOL) 50000 UNITS CAPS capsule Take 50,000 Units by mouth every 30 (thirty) days.      Marland Kitchen warfarin (COUMADIN) 5 MG tablet Take 1 tablet (5 mg total) by mouth one time only at 6 PM.       No current facility-administered medications for this visit.   Allergies  as of 10/13/2013  . (No Known Allergies)    reports that he has been smoking Cigarettes.  He has a 15 pack-year smoking history. He has quit using smokeless tobacco. He reports that he drinks about 7.2 ounces of alcohol per week. He reports that he does not use illicit drugs.     Review of Systems: Pertinent positive and negative review of systems were noted in the above HPI section. All other review of systems were otherwise negative.  Vital signs were reviewed in today's medical record Physical Exam: General: Well developed , well nourished, no acute distress Skin: anicteric Head: Normocephalic and atraumatic Eyes:  sclerae anicteric, EOMI Ears: Normal auditory acuity Mouth: No deformity or lesions Neck: Supple, no masses or thyromegaly Lungs: There are a few expiratory wheezes Heart: Regular rate and rhythm; no murmurs, rubs or bruits Abdomen: Soft, non tender  and non distended. No masses, hepatosplenomegaly or hernias noted. Normal Bowel sounds.  There is  right inguinal hernia  but I am unable to reduce.  A colostomy is in place Rectal:deferred Musculoskeletal: Symmetrical with no gross deformities  Skin: No lesions on visible extremities Pulses:  Normal pulses noted Extremities: No clubbing, cyanosis, edema or deformities noted Neurological: Alert oriented x 4, grossly nonfocal Cervical Nodes:  No significant cervical adenopathy Inguinal Nodes: No significant inguinal adenopathy Psychological:  Alert and cooperative. Normal mood and affect

## 2013-10-13 NOTE — Assessment & Plan Note (Signed)
Patient has a symptomatic hernia that will require repair.  Recommendations #1 refer back to general surgery for evaluation and therapy of the hernia

## 2013-10-13 NOTE — Patient Instructions (Signed)
Please schedule a follow up appointment with Dr [name]

## 2013-10-14 ENCOUNTER — Non-Acute Institutional Stay (SKILLED_NURSING_FACILITY): Payer: Medicare Other | Admitting: Internal Medicine

## 2013-10-14 DIAGNOSIS — D473 Essential (hemorrhagic) thrombocythemia: Secondary | ICD-10-CM

## 2013-10-14 DIAGNOSIS — D638 Anemia in other chronic diseases classified elsewhere: Secondary | ICD-10-CM

## 2013-10-15 ENCOUNTER — Other Ambulatory Visit: Payer: Self-pay | Admitting: *Deleted

## 2013-10-15 ENCOUNTER — Telehealth: Payer: Self-pay | Admitting: Gastroenterology

## 2013-10-15 MED ORDER — OXYCODONE HCL 5 MG/5ML PO SOLN: 5.0000 mg | ORAL | Status: DC | PRN

## 2013-10-15 NOTE — Telephone Encounter (Signed)
Printed office notes and faxed to nursing home to the number provided

## 2013-10-20 ENCOUNTER — Other Ambulatory Visit: Payer: Self-pay | Admitting: *Deleted

## 2013-10-20 MED ORDER — OXYCODONE HCL 5 MG/5ML PO SOLN: 5.0000 mg | ORAL | Status: DC | PRN

## 2013-10-21 ENCOUNTER — Non-Acute Institutional Stay (SKILLED_NURSING_FACILITY): Payer: Medicare Other | Admitting: Internal Medicine

## 2013-10-21 DIAGNOSIS — R252 Cramp and spasm: Secondary | ICD-10-CM

## 2013-10-22 ENCOUNTER — Encounter (INDEPENDENT_AMBULATORY_CARE_PROVIDER_SITE_OTHER): Payer: Self-pay | Admitting: General Surgery

## 2013-10-22 ENCOUNTER — Ambulatory Visit (INDEPENDENT_AMBULATORY_CARE_PROVIDER_SITE_OTHER): Payer: Medicare Other | Admitting: General Surgery

## 2013-10-22 VITALS — BP 110/64 | HR 72 | Temp 99.0°F | Resp 20 | Ht 66.5 in | Wt 146.6 lb

## 2013-10-22 DIAGNOSIS — Z9049 Acquired absence of other specified parts of digestive tract: Secondary | ICD-10-CM | POA: Insufficient documentation

## 2013-10-22 DIAGNOSIS — Z933 Colostomy status: Secondary | ICD-10-CM

## 2013-10-22 DIAGNOSIS — Z9889 Other specified postprocedural states: Secondary | ICD-10-CM

## 2013-10-22 NOTE — Progress Notes (Signed)
Subjective:     Patient ID: Zachary [name], male   DOB: August 09, 1941, 72 y.o.   MRN: [id number]  HPI Patient is status post sigmoid colectomy and colostomy for jejunal perforation. He continues to be in skilled nursing facility. He is doing well. His appetite has not fully returned. His ostomy is functioning well.  Review of Systems     Objective:   Physical Exam Abdomen is soft and nontender. Midline wound is almost healed. A small gauze dressing was applied. Stoma is functional with soft stool.    Assessment:     Status post a colectomy and colostomy for chicken Bone perforation    Plan:     Change with care to just dry gauze daily on midline until it heals. I will see him back after the first of the year. We will discuss potential for colostomy takedown further at that time. We'll

## 2013-10-23 ENCOUNTER — Other Ambulatory Visit: Payer: Self-pay

## 2013-10-23 MED ORDER — DRONABINOL 2.5 MG PO CAPS: 2.5000 mg | ORAL_CAPSULE | Freq: Two times a day (BID) | ORAL | Status: DC

## 2013-10-24 ENCOUNTER — Other Ambulatory Visit: Payer: Self-pay | Admitting: *Deleted

## 2013-10-24 MED ORDER — OXYCODONE HCL 5 MG/5ML PO SOLN: ORAL | Status: DC

## 2013-10-24 NOTE — Telephone Encounter (Signed)
rx filled per protocol  

## 2013-10-28 ENCOUNTER — Other Ambulatory Visit: Payer: Self-pay

## 2013-10-28 MED ORDER — OXYCODONE HCL 5 MG/5ML PO SOLN: ORAL | Status: DC

## 2013-11-03 ENCOUNTER — Non-Acute Institutional Stay (SKILLED_NURSING_FACILITY): Payer: Medicare Other | Admitting: Family

## 2013-11-03 DIAGNOSIS — K4090 Unilateral inguinal hernia, without obstruction or gangrene, not specified as recurrent: Secondary | ICD-10-CM

## 2013-11-04 ENCOUNTER — Encounter: Payer: Self-pay | Admitting: Family

## 2013-11-04 ENCOUNTER — Non-Acute Institutional Stay (SKILLED_NURSING_FACILITY): Payer: Medicare Other | Admitting: Internal Medicine

## 2013-11-04 DIAGNOSIS — I10 Essential (primary) hypertension: Secondary | ICD-10-CM

## 2013-11-04 DIAGNOSIS — D7589 Other specified diseases of blood and blood-forming organs: Secondary | ICD-10-CM

## 2013-11-04 DIAGNOSIS — I4891 Unspecified atrial fibrillation: Secondary | ICD-10-CM

## 2013-11-04 DIAGNOSIS — J449 Chronic obstructive pulmonary disease, unspecified: Secondary | ICD-10-CM

## 2013-11-04 DIAGNOSIS — J4489 Other specified chronic obstructive pulmonary disease: Secondary | ICD-10-CM

## 2013-11-04 NOTE — Progress Notes (Signed)
Patient ID: Zachary [name], male   DOB: 1941/10/15, 72 y.o.   MRN: [id number]  Date: 11/03/13 Facility: [facility]    Chief Complaint  Patient presents with  . Acute Visit    Abddominal Pain    HPI: Pt presents with c/o acute on chronic abdominal pain. Pt denies N/V/fever, chills; however endorses increased intermittent pain of R suprapubic region. Pt describes pain as an ache and reports it is mitigated by prn oxycodone. Pt reports h/o hernia x 2 years; pt further endorses recent hospitalization for ex lap procedure and colostomy. Pt denies discomfort at colostomy site and/or surgical incision site.  Pt denies change in hernia position and size. Pt denies further issues and concerns at present.      No Known Allergies   Medication List       This list is accurate as of: 11/03/13 11:59 PM.  Always use your most recent med list.               acetaminophen 160 MG/5ML solution  Commonly known as:  TYLENOL  Take 500 mg by mouth.     albuterol 108 (90 BASE) MCG/ACT inhaler  Commonly known as:  PROVENTIL HFA;VENTOLIN HFA  Inhale 2 puffs into the lungs every 6 (six) hours as needed for wheezing.     diltiazem 120 MG 24 hr capsule  Commonly known as:  DILACOR XR  Take 1 capsule (120 mg total) by mouth daily.     diltiazem 120 MG 24 hr capsule  Commonly known as:  CARDIZEM CD     dronabinol 2.5 MG capsule  Commonly known as:  MARINOL  Take 1 capsule (2.5 mg total) by mouth 2 (two) times daily before lunch and supper.     enoxaparin 40 MG/0.4ML injection  Commonly known as:  LOVENOX  Inject 0.4 mLs (40 mg total) into the skin daily. PLEASE STOP WHEN INR IS >2     feeding supplement (PRO-STAT SUGAR FREE 64) Liqd  Take 30 mLs by mouth 2 (two) times daily.     gabapentin 100 MG capsule  Commonly known as:  NEURONTIN  Take 100 mg by mouth 3 (three) times daily.     metoprolol succinate 25 MG 24 hr tablet  Commonly known as:  TOPROL-XL  Take 1 tablet (25 mg total)  by mouth every evening.     naproxen 500 MG tablet  Commonly known as:  NAPROSYN     oxyCODONE 5 MG/5ML solution  Commonly known as:  ROXICODONE  Administer 5-10 ml (5-10 mg) by mouth every 4 hours as needed     pantoprazole 40 MG tablet  Commonly known as:  PROTONIX  Take 40 mg by mouth daily.     SYMBICORT 160-4.5 MCG/ACT inhaler  Generic drug:  budesonide-formoterol     traMADol 50 MG tablet  Commonly known as:  ULTRAM  Take 50-100 mg by mouth every 6 (six) hours as needed for pain.     traZODone 50 MG tablet  Commonly known as:  DESYREL  Take 50-100 mg by mouth at bedtime as needed for sleep. *takes an extra 50mg  if need help sleeping     Vitamin D (Ergocalciferol) 50000 UNITS Caps capsule  Commonly known as:  DRISDOL  Take 50,000 Units by mouth every 30 (thirty) days.     warfarin 5 MG tablet  Commonly known as:  COUMADIN  Take 1 tablet (5 mg total) by mouth one time only at 6 PM.  DATA REVIEWED   Laboratory Studies: Reviewed     Past Medical History  Diagnosis Date  . Hypertension   . Coronary artery disease   . Hyperlipemia   . Angina   . Shortness of breath   . Mental disorder   . Arthritis   . COPD (chronic obstructive pulmonary disease)   . Hiatal hernia   . Depression   . Gout   . Joint pain   . Depression   . Anxiety   . Ileus, postoperative 09/29/2013  . PAF (paroxysmal atrial fibrillation) 09/29/2013    On lopressor and Cardizem at home, no anticoagulation.  . S/P colostomy      Past Surgical History  Procedure Laterality Date  . Abdominal surgery    . Laparotomy N/A 09/26/2013    Procedure: EXPLORATORY LAPAROTOMY;  Surgeon: Liz Malady, MD;  Location: [facility] OR;  Service: General;  Laterality: N/A;     History   Social History  . Marital Status: Single    Spouse Name: N/A    Number of Children: N/A  . Years of Education: N/A   Occupational History  . Not on file.   Social History Main Topics  . Smoking status:  Current Every Day Smoker -- 1.00 packs/day for 15 years    Types: Cigarettes  . Smokeless tobacco: Former Neurosurgeon  . Alcohol Use: 7.2 oz/week    12 Cans of beer per week     Comment: pt heavy drinker, pt stets at lest 2-3 40oz per day  . Drug Use: No     Comment: HX OF COCAINE USE STATES HE QUIT THAT 2012  . Sexual Activity: Not on file   Other Topics Concern  . Not on file   Social History Narrative  . No narrative on file     Review of Systems  Constitutional: Negative.   Respiratory:       Occassional use of oxygen with SOB  Cardiovascular: Negative.   Gastrointestinal: Positive for abdominal pain. Negative for blood in stool.       S/p colostomy, s/p ex-lap, Hernia x 2 years  Genitourinary: Negative.   Musculoskeletal: Positive for back pain and joint pain.  Skin: Negative.   Psychiatric/Behavioral: Negative.      Physical Exam Filed Vitals:   11/04/13 1324  BP: 138/76  Pulse: 60  Temp: 98.1 F (36.7 C)  Resp: 24   There is no weight on file to calculate BMI. Physical Exam  Constitutional: He is oriented to person, place, and time.  Cardiovascular: Normal rate, regular rhythm and normal heart sounds.   Pulmonary/Chest: Effort normal and breath sounds normal.  Abdominal:  Hernia noted to R suprapubic region, Witness LPN, Colostomy to Left Quadrant, Midline abdominal dsg  Musculoskeletal:  Self-propelled in wheelchair  Neurological: He is alert and oriented to person, place, and time.    ASSESSMENT/PLAN  KUB-r/o ileus, f/u with acute pain Hernia-appt with surgery in progress, prn pain medication provided   Follow up:prn

## 2013-11-07 ENCOUNTER — Encounter: Payer: Self-pay | Admitting: Internal Medicine

## 2013-11-07 DIAGNOSIS — I2510 Atherosclerotic heart disease of native coronary artery without angina pectoris: Secondary | ICD-10-CM | POA: Insufficient documentation

## 2013-11-07 NOTE — Progress Notes (Signed)
Patient ID: Zachary [name], male   DOB: [birth year], 72 y.o.   MRN: [id number]        HISTORY & PHYSICAL  DATE: 10/07/2013     FACILITY: [facility]  LEVEL OF CARE: SNF (31)  ALLERGIES:  No Known Allergies  CHIEF COMPLAINT:  Manage sigmoid perforation, atrial fibrillation, and COPD.    HISTORY OF PRESENT ILLNESS:  The patient is a 72 year-old, male.    SIGMOID PERFORATION:  The patient had sigmoid perforation secondary to a chicken bone.  He underwent exploratory laparotomy, lysis of adhesions, partial colectomy, and colostomy.  He tolerated the procedure well and is readmitted back to this facility for long-term care management.  He denies abdominal pain, nausea or vomiting.    ATRIAL FIBRILLATION: the patients atrial fibrillation remains stable.  The patient denies DOE, tachycardia, orthopnea, transient neurological sx, pedal edema, palpitations, & PNDs.  No complications noted from the medications currently being used.    COPD: the COPD remains stable.  Pt denies sob, cough, wheezing or declining exercise tolerance.  No complications from the medications presently being used.    PAST MEDICAL HISTORY :  Past Medical History  Diagnosis Date  . Hypertension   . Coronary artery disease   . Hyperlipemia   . Angina   . Shortness of breath   . Mental disorder   . Arthritis   . COPD (chronic obstructive pulmonary disease)   . Hiatal hernia   . Depression   . Gout   . Joint pain   . Depression   . Anxiety   . Ileus, postoperative 09/29/2013  . PAF (paroxysmal atrial fibrillation) 09/29/2013    On lopressor and Cardizem at home, no anticoagulation.  . S/P colostomy     PAST SURGICAL HISTORY: Past Surgical History  Procedure Laterality Date  . Abdominal surgery    . Laparotomy N/A 09/26/2013    Procedure: EXPLORATORY LAPAROTOMY;  Surgeon: Liz Malady, MD;  Location: [facility] OR;  Service: General;  Laterality: N/A;    SOCIAL HISTORY:  reports that he has  been smoking Cigarettes.  He has a 15 pack-year smoking history. He has quit using smokeless tobacco. He reports that he drinks about 7.2 ounces of alcohol per week. He reports that he does not use illicit drugs.  FAMILY HISTORY: None  CURRENT MEDICATIONS: Reviewed per MAR  REVIEW OF SYSTEMS:  See HPI otherwise 14 point ROS is negative.  PHYSICAL EXAMINATION  VS:  T 98.3       P 56      RR 20      BP 114/60      POX%        WT (Lb) 156    GENERAL: no acute distress, normal body habitus EYES: conjunctivae normal, sclerae normal, normal eye lids MOUTH/THROAT: lips without lesions,no lesions in the mouth,tongue is without lesions,uvula elevates in midline NECK: supple, trachea midline, no neck masses, no thyroid tenderness, no thyromegaly LYMPHATICS: no LAN in the neck, no supraclavicular LAN RESPIRATORY: breathing is even & unlabored, BS CTAB CARDIAC: heart rate is irregular irregular, no murmur,no extra heart sounds, no edema GI:  ABDOMEN: intact transverse dressing and left lower quadrant colostomy, bowel sounds diminished, abdomen mildly tender to palpation      LIVER/SPLEEN: no hepatomegaly, no splenomegaly MUSCULOSKELETAL: HEAD: normal to inspection & palpation BACK: no kyphosis, scoliosis or spinal processes tenderness EXTREMITIES: LEFT UPPER EXTREMITY: full range of motion, normal strength & tone RIGHT UPPER EXTREMITY:  full  range of motion, normal strength & tone LEFT LOWER EXTREMITY:  full range of motion, normal strength & tone RIGHT LOWER EXTREMITY:  full range of motion, normal strength & tone PSYCHIATRIC: the patient is alert & oriented to person, affect & behavior appropriate  LABS/RADIOLOGY: BMP normal.    WBC 10.9, hemoglobin 9.5, MCV 85.1, platelets 235.    ASSESSMENT/PLAN:  Sigmoid perforation.  Status post surgery.  Continue wound care.    Atrial fibrillation.  Rate controlled.    COPD.  Well compensated.    CAD.  Stable.    Hypertension.  Well  controlled.    Cirrhosis.  Compensated.    GERD.  Well controlled.     Check CBC and BMP.    I have reviewed patient's medical records received at admission/from hospitalization.  CPT CODE: 16109

## 2013-11-07 NOTE — Progress Notes (Signed)
PROGRESS NOTE  DATE: 82 -2-14  FACILITY: Nursing Home Location: [facility]  LEVEL OF CARE: SNF (31)  Routine Visit  CHIEF COMPLAINT:  Manage atrial fibrillation, COPD and hypertension  HISTORY OF PRESENT ILLNESS:  REASSESSMENT OF ONGOING PROBLEM(S):  ATRIAL FIBRILLATION: the patients atrial fibrillation remains stable.  The staff deny DOE, tachycardia, orthopnea, transient neurological sx, pedal edema, palpitations, & PNDs.  No complications noted from the medications currently being used.  COPD: the COPD remains stable.  Staff  deny sob, cough, wheezing or declining exercise tolerance.  No complications from the medications presently being used.  HTN: Pt 's HTN remains stable.  The staff Deny CP, sob, DOE, pedal edema, headaches, dizziness or visual disturbances.  No complications from the medications currently being used.  Last BP : 106/60, 122/68  PAST MEDICAL HISTORY : Reviewed.  No changes.  CURRENT MEDICATIONS: Reviewed per [facility]  REVIEW OF SYSTEMS: Unobtainable, patient is a poor historian  PHYSICAL EXAMINATION  VS:  T 97.4     P 90     RR 18      BP 122/68     POX %     WT (Lb) 151  GENERAL: no acute distress, normal body habitus EYES: conjunctivae normal, sclerae normal, normal eye lids NECK: supple, trachea midline, no neck masses, no thyroid tenderness, no thyromegaly LYMPHATICS: no LAN in the neck, no supraclavicular LAN RESPIRATORY: breathing is even & unlabored, BS CTAB CARDIAC: Heart rate is irregularly irregular, no murmur,no extra heart sounds, no edema GI: abdomen soft, normal BS, no masses, no tenderness, no hepatomegaly, no splenomegaly PSYCHIATRIC: the patient is alert & disoriented, affect & behavior appropriate  LABS/RADIOLOGY:  11-14 potassium 4.4, hemoglobin 9.2, hemoglobin 8.9, MCV 83, platelets 465, WBC 8.3, BMP normal  9-14 albumin 3.3, AST 51 otherwise liver profile normal, vitamin B12 level 606, folate 15  8-14 WBC  9.6, hemoglobin 11.4, MCV 98, platelets 434, creatinine 2.07 otherwise BMP normal  ASSESSMENT/PLAN:  COPD-well compensated Atrial fibrillation-rate controlled hypertension-well-controlled Macrocytosis- vitamin B12 and RBC folate level normal CAD-stable GERD-stable  CPT CODE: 96045

## 2013-11-08 ENCOUNTER — Observation Stay (HOSPITAL_COMMUNITY): Payer: PRIVATE HEALTH INSURANCE | Admitting: Anesthesiology

## 2013-11-08 ENCOUNTER — Encounter (HOSPITAL_COMMUNITY): Payer: Self-pay | Admitting: Emergency Medicine

## 2013-11-08 ENCOUNTER — Encounter (HOSPITAL_COMMUNITY): Admission: EM | Disposition: A | Discharge status: Skilled Nursing Facility | Payer: Self-pay | Source: Home / Self Care

## 2013-11-08 ENCOUNTER — Inpatient Hospital Stay (HOSPITAL_COMMUNITY)
Admission: EM | Admit: 2013-11-08 | Discharge: 2013-11-13 | DRG: 350 | Disposition: A | Discharge status: Skilled Nursing Facility | Payer: PRIVATE HEALTH INSURANCE | Attending: General Surgery | Admitting: General Surgery

## 2013-11-08 ENCOUNTER — Encounter (HOSPITAL_COMMUNITY): Payer: PRIVATE HEALTH INSURANCE | Admitting: Anesthesiology

## 2013-11-08 DIAGNOSIS — F10939 Alcohol use, unspecified with withdrawal, unspecified: Secondary | ICD-10-CM | POA: Diagnosis not present

## 2013-11-08 DIAGNOSIS — K4030 Unilateral inguinal hernia, with obstruction, without gangrene, not specified as recurrent: Principal | ICD-10-CM | POA: Diagnosis present

## 2013-11-08 DIAGNOSIS — K4090 Unilateral inguinal hernia, without obstruction or gangrene, not specified as recurrent: Secondary | ICD-10-CM | POA: Diagnosis present

## 2013-11-08 DIAGNOSIS — J4489 Other specified chronic obstructive pulmonary disease: Secondary | ICD-10-CM

## 2013-11-08 DIAGNOSIS — F411 Generalized anxiety disorder: Secondary | ICD-10-CM | POA: Diagnosis present

## 2013-11-08 DIAGNOSIS — I10 Essential (primary) hypertension: Secondary | ICD-10-CM | POA: Diagnosis present

## 2013-11-08 DIAGNOSIS — I4891 Unspecified atrial fibrillation: Secondary | ICD-10-CM | POA: Diagnosis present

## 2013-11-08 DIAGNOSIS — E43 Unspecified severe protein-calorie malnutrition: Secondary | ICD-10-CM | POA: Diagnosis present

## 2013-11-08 DIAGNOSIS — Z9049 Acquired absence of other specified parts of digestive tract: Secondary | ICD-10-CM

## 2013-11-08 DIAGNOSIS — K460 Unspecified abdominal hernia with obstruction, without gangrene: Secondary | ICD-10-CM

## 2013-11-08 DIAGNOSIS — F329 Major depressive disorder, single episode, unspecified: Secondary | ICD-10-CM | POA: Diagnosis present

## 2013-11-08 DIAGNOSIS — F10239 Alcohol dependence with withdrawal, unspecified: Secondary | ICD-10-CM | POA: Diagnosis not present

## 2013-11-08 DIAGNOSIS — F1020 Alcohol dependence, uncomplicated: Secondary | ICD-10-CM | POA: Diagnosis present

## 2013-11-08 DIAGNOSIS — IMO0002 Reserved for concepts with insufficient information to code with codable children: Secondary | ICD-10-CM

## 2013-11-08 DIAGNOSIS — F3289 Other specified depressive episodes: Secondary | ICD-10-CM | POA: Diagnosis present

## 2013-11-08 DIAGNOSIS — J449 Chronic obstructive pulmonary disease, unspecified: Secondary | ICD-10-CM | POA: Diagnosis present

## 2013-11-08 DIAGNOSIS — Z933 Colostomy status: Secondary | ICD-10-CM

## 2013-11-08 DIAGNOSIS — M129 Arthropathy, unspecified: Secondary | ICD-10-CM | POA: Diagnosis present

## 2013-11-08 DIAGNOSIS — I2510 Atherosclerotic heart disease of native coronary artery without angina pectoris: Secondary | ICD-10-CM | POA: Diagnosis present

## 2013-11-08 DIAGNOSIS — E785 Hyperlipidemia, unspecified: Secondary | ICD-10-CM | POA: Diagnosis present

## 2013-11-08 DIAGNOSIS — F17200 Nicotine dependence, unspecified, uncomplicated: Secondary | ICD-10-CM | POA: Diagnosis present

## 2013-11-08 DIAGNOSIS — M109 Gout, unspecified: Secondary | ICD-10-CM | POA: Diagnosis present

## 2013-11-08 HISTORY — PX: ORCHIECTOMY: SHX2116

## 2013-11-08 HISTORY — PX: INGUINAL HERNIA REPAIR: SHX194

## 2013-11-08 LAB — CBC WITH DIFFERENTIAL/PLATELET
Basophils Absolute: 0 10*3/uL (ref 0.0–0.1)
Basophils Relative: 0 % (ref 0–1)
Eosinophils Absolute: 0 10*3/uL (ref 0.0–0.7)
Eosinophils Relative: 0 % (ref 0–5)
HCT: 33.4 % — ABNORMAL LOW (ref 39.0–52.0)
MCH: 25.8 pg — ABNORMAL LOW (ref 26.0–34.0)
MCHC: 31.1 g/dL (ref 30.0–36.0)
MCV: 82.9 fL (ref 78.0–100.0)
Monocytes Absolute: 1.6 10*3/uL — ABNORMAL HIGH (ref 0.1–1.0)
Neutrophils Relative %: 78 % — ABNORMAL HIGH (ref 43–77)
Platelets: 321 10*3/uL (ref 150–400)
RDW: 15.8 % — ABNORMAL HIGH (ref 11.5–15.5)
WBC: 17.5 10*3/uL — ABNORMAL HIGH (ref 4.0–10.5)

## 2013-11-08 LAB — URINALYSIS, ROUTINE W REFLEX MICROSCOPIC
Bilirubin Urine: NEGATIVE
Ketones, ur: NEGATIVE mg/dL
Leukocytes, UA: NEGATIVE
Nitrite: NEGATIVE
Protein, ur: NEGATIVE mg/dL
Specific Gravity, Urine: 1.021 (ref 1.005–1.030)

## 2013-11-08 LAB — COMPREHENSIVE METABOLIC PANEL
ALT: 15 U/L (ref 0–53)
AST: 21 U/L (ref 0–37)
Albumin: 2.3 g/dL — ABNORMAL LOW (ref 3.5–5.2)
Alkaline Phosphatase: 87 U/L (ref 39–117)
BUN: 14 mg/dL (ref 6–23)
Chloride: 99 mEq/L (ref 96–112)
GFR calc non Af Amer: 82 mL/min — ABNORMAL LOW (ref >90)
Potassium: 3.8 mEq/L (ref 3.5–5.1)
Sodium: 135 mEq/L (ref 135–145)
Total Bilirubin: 0.5 mg/dL (ref 0.3–1.2)

## 2013-11-08 LAB — TYPE AND SCREEN: ABO/RH(D): O POS

## 2013-11-08 LAB — CBC
HCT: 35.5 % — ABNORMAL LOW (ref 39.0–52.0)
Hemoglobin: 11.4 g/dL — ABNORMAL LOW (ref 13.0–17.0)
Platelets: 293 10*3/uL (ref 150–400)
RBC: 4.23 MIL/uL (ref 4.22–5.81)
WBC: 19.4 10*3/uL — ABNORMAL HIGH (ref 4.0–10.5)

## 2013-11-08 SURGERY — REPAIR, HERNIA, INGUINAL, ADULT
Anesthesia: General | Site: Scrotum | Laterality: Right

## 2013-11-08 MED ORDER — HEPARIN SODIUM (PORCINE) 5000 UNIT/ML IJ SOLN
5000.0000 [IU] | Freq: Three times a day (TID) | INTRAMUSCULAR | Status: DC
  Administered 2013-11-09 – 2013-11-10 (×4): 5000 [IU] via SUBCUTANEOUS
  Filled 2013-11-08 (×6): qty 1

## 2013-11-08 MED ORDER — ONDANSETRON HCL 4 MG/2ML IJ SOLN
4.0000 mg | Freq: Four times a day (QID) | INTRAMUSCULAR | Status: DC | PRN
  Administered 2013-11-09: 4 mg via INTRAVENOUS

## 2013-11-08 MED ORDER — LACTATED RINGERS IV SOLN
INTRAVENOUS | Status: DC | PRN
  Administered 2013-11-08 (×2): via INTRAVENOUS

## 2013-11-08 MED ORDER — POTASSIUM CHLORIDE IN NACL 20-0.45 MEQ/L-% IV SOLN
INTRAVENOUS | Status: DC
  Administered 2013-11-08 – 2013-11-11 (×5): via INTRAVENOUS
  Administered 2013-11-12: 50 mL/h via INTRAVENOUS
  Filled 2013-11-08 (×11): qty 1000

## 2013-11-08 MED ORDER — FENTANYL CITRATE 0.05 MG/ML IJ SOLN
INTRAMUSCULAR | Status: AC
  Administered 2013-11-08: 100 ug via INTRAVENOUS
  Filled 2013-11-08: qty 2

## 2013-11-08 MED ORDER — LACTATED RINGERS IV SOLN
INTRAVENOUS | Status: DC | PRN
  Administered 2013-11-08: 18:00:00 via INTRAVENOUS

## 2013-11-08 MED ORDER — FENTANYL CITRATE 0.05 MG/ML IJ SOLN
INTRAMUSCULAR | Status: AC
  Filled 2013-11-08: qty 2

## 2013-11-08 MED ORDER — BUPIVACAINE-EPINEPHRINE (PF) 0.25% -1:200000 IJ SOLN
INTRAMUSCULAR | Status: AC
  Filled 2013-11-08: qty 30

## 2013-11-08 MED ORDER — SODIUM CHLORIDE 0.9 % IV SOLN
1.0000 g | INTRAVENOUS | Status: DC
  Administered 2013-11-08 – 2013-11-12 (×5): 1 g via INTRAVENOUS
  Filled 2013-11-08 (×7): qty 1

## 2013-11-08 MED ORDER — FENTANYL CITRATE 0.05 MG/ML IJ SOLN
INTRAMUSCULAR | Status: DC | PRN
  Administered 2013-11-08 (×3): 50 ug via INTRAVENOUS
  Administered 2013-11-08: 100 ug via INTRAVENOUS

## 2013-11-08 MED ORDER — HYDROMORPHONE HCL PF 1 MG/ML IJ SOLN
INTRAMUSCULAR | Status: AC
  Filled 2013-11-08: qty 1

## 2013-11-08 MED ORDER — ALBUTEROL SULFATE HFA 108 (90 BASE) MCG/ACT IN AERS
2.0000 | INHALATION_SPRAY | Freq: Four times a day (QID) | RESPIRATORY_TRACT | Status: DC | PRN
  Filled 2013-11-08 (×2): qty 6.7

## 2013-11-08 MED ORDER — HYDROMORPHONE HCL PF 1 MG/ML IJ SOLN
1.0000 mg | INTRAMUSCULAR | Status: DC | PRN
  Administered 2013-11-09 – 2013-11-11 (×10): 1 mg via INTRAVENOUS
  Filled 2013-11-08 (×10): qty 1

## 2013-11-08 MED ORDER — CEFAZOLIN SODIUM-DEXTROSE 2-3 GM-% IV SOLR
INTRAVENOUS | Status: AC
  Administered 2013-11-08: 2 g via INTRAVENOUS
  Filled 2013-11-08: qty 50

## 2013-11-08 MED ORDER — DEXTROSE-NACL 5-0.9 % IV SOLN: INTRAVENOUS | Status: DC

## 2013-11-08 MED ORDER — FENTANYL CITRATE 0.05 MG/ML IJ SOLN
100.0000 ug | Freq: Once | INTRAMUSCULAR | Status: AC
  Administered 2013-11-08: 100 ug via INTRAVENOUS

## 2013-11-08 MED ORDER — PROPOFOL 10 MG/ML IV BOLUS
INTRAVENOUS | Status: DC | PRN
  Administered 2013-11-08: 140 mg via INTRAVENOUS
  Administered 2013-11-08: 20 mg via INTRAVENOUS

## 2013-11-08 MED ORDER — VITAMIN K1 10 MG/ML IJ SOLN
1.0000 mg | Freq: Once | INTRAVENOUS | Status: DC
  Filled 2013-11-08 (×2): qty 0.1

## 2013-11-08 MED ORDER — HYDROMORPHONE HCL PF 1 MG/ML IJ SOLN
0.2500 mg | INTRAMUSCULAR | Status: DC | PRN
  Administered 2013-11-08 (×4): 0.5 mg via INTRAVENOUS

## 2013-11-08 MED ORDER — PHENYLEPHRINE HCL 10 MG/ML IJ SOLN
INTRAMUSCULAR | Status: DC | PRN
  Administered 2013-11-08: 80 ug via INTRAVENOUS

## 2013-11-08 MED ORDER — ARTIFICIAL TEARS OP OINT
TOPICAL_OINTMENT | OPHTHALMIC | Status: DC | PRN
  Administered 2013-11-08: 1 via OPHTHALMIC

## 2013-11-08 MED ORDER — FENTANYL CITRATE 0.05 MG/ML IJ SOLN
50.0000 ug | Freq: Once | INTRAMUSCULAR | Status: AC
  Administered 2013-11-08: 50 ug via INTRAVENOUS
  Filled 2013-11-08: qty 2

## 2013-11-08 MED ORDER — ONDANSETRON HCL 4 MG/2ML IJ SOLN: 4.0000 mg | Freq: Once | INTRAMUSCULAR | Status: DC | PRN

## 2013-11-08 MED ORDER — GLYCOPYRROLATE 0.2 MG/ML IJ SOLN
INTRAMUSCULAR | Status: DC | PRN
  Administered 2013-11-08: 0.6 mg via INTRAVENOUS

## 2013-11-08 MED ORDER — ONDANSETRON HCL 4 MG/2ML IJ SOLN
4.0000 mg | Freq: Once | INTRAMUSCULAR | Status: AC
  Administered 2013-11-08: 4 mg via INTRAVENOUS
  Filled 2013-11-08: qty 2

## 2013-11-08 MED ORDER — BUPIVACAINE-EPINEPHRINE 0.25% -1:200000 IJ SOLN
INTRAMUSCULAR | Status: DC | PRN
  Administered 2013-11-08: 3 mL

## 2013-11-08 MED ORDER — LIDOCAINE HCL (CARDIAC) 20 MG/ML IV SOLN
INTRAVENOUS | Status: DC | PRN
  Administered 2013-11-08: 40 mg via INTRAVENOUS

## 2013-11-08 MED ORDER — SUCCINYLCHOLINE CHLORIDE 20 MG/ML IJ SOLN
INTRAMUSCULAR | Status: DC | PRN
  Administered 2013-11-08: 100 mg via INTRAVENOUS

## 2013-11-08 MED ORDER — PANTOPRAZOLE SODIUM 40 MG IV SOLR
40.0000 mg | Freq: Every day | INTRAVENOUS | Status: DC
  Administered 2013-11-09 – 2013-11-12 (×4): 40 mg via INTRAVENOUS
  Filled 2013-11-08 (×6): qty 40

## 2013-11-08 MED ORDER — NEOSTIGMINE METHYLSULFATE 1 MG/ML IJ SOLN
INTRAMUSCULAR | Status: DC | PRN
  Administered 2013-11-08: 4 mg via INTRAVENOUS

## 2013-11-08 MED ORDER — ONDANSETRON HCL 4 MG/2ML IJ SOLN
4.0000 mg | Freq: Four times a day (QID) | INTRAMUSCULAR | Status: DC | PRN
  Filled 2013-11-08: qty 2

## 2013-11-08 MED ORDER — ROCURONIUM BROMIDE 100 MG/10ML IV SOLN
INTRAVENOUS | Status: DC | PRN
  Administered 2013-11-08: 30 mg via INTRAVENOUS

## 2013-11-08 MED ORDER — PANTOPRAZOLE SODIUM 40 MG IV SOLR
40.0000 mg | Freq: Every day | INTRAVENOUS | Status: DC
  Administered 2013-11-08: 40 mg via INTRAVENOUS
  Filled 2013-11-08 (×2): qty 40

## 2013-11-08 SURGICAL SUPPLY — 76 items
ADH SKN CLS APL DERMABOND .7 (GAUZE/BANDAGES/DRESSINGS) ×3
BLADE SURG 10 STRL SS (BLADE) ×4 IMPLANT
BLADE SURG 15 STRL LF DISP TIS (BLADE) ×3 IMPLANT
BLADE SURG 15 STRL SS (BLADE) ×4
BLADE SURG ROTATE 9660 (MISCELLANEOUS) IMPLANT
CANISTER SUCTION 2500CC (MISCELLANEOUS) ×4 IMPLANT
CHLORAPREP W/TINT 26ML (MISCELLANEOUS) ×4 IMPLANT
COVER MAYO STAND STRL (DRAPES) IMPLANT
COVER SURGICAL LIGHT HANDLE (MISCELLANEOUS) ×4 IMPLANT
DERMABOND ADVANCED (GAUZE/BANDAGES/DRESSINGS) ×1
DERMABOND ADVANCED .7 DNX12 (GAUZE/BANDAGES/DRESSINGS) ×3 IMPLANT
DRAIN PENROSE 1/2X12 LTX STRL (WOUND CARE) ×2 IMPLANT
DRAPE LAPAROSCOPIC ABDOMINAL (DRAPES) ×4 IMPLANT
DRAPE LAPAROTOMY TRNSV 102X78 (DRAPE) ×4 IMPLANT
DRAPE PROXIMA HALF (DRAPES) IMPLANT
DRAPE UTILITY 15X26 W/TAPE STR (DRAPE) ×8 IMPLANT
DRAPE WARM FLUID 44X44 (DRAPE) ×4 IMPLANT
DRSG OPSITE POSTOP 4X10 (GAUZE/BANDAGES/DRESSINGS) IMPLANT
DRSG OPSITE POSTOP 4X8 (GAUZE/BANDAGES/DRESSINGS) IMPLANT
ELECT BLADE 6.5 EXT (BLADE) IMPLANT
ELECT CAUTERY BLADE 6.4 (BLADE) ×6 IMPLANT
ELECT REM PT RETURN 9FT ADLT (ELECTROSURGICAL) ×4
ELECTRODE REM PT RTRN 9FT ADLT (ELECTROSURGICAL) ×3 IMPLANT
GLOVE BIO SURGEON STRL SZ7.5 (GLOVE) ×6 IMPLANT
GLOVE BIOGEL PI IND STRL 8 (GLOVE) ×3 IMPLANT
GLOVE BIOGEL PI INDICATOR 8 (GLOVE) ×1
GOWN PREVENTION PLUS XXLARGE (GOWN DISPOSABLE) ×2 IMPLANT
GOWN STRL NON-REIN LRG LVL3 (GOWN DISPOSABLE) ×8 IMPLANT
GOWN STRL REIN XL XLG (GOWN DISPOSABLE) ×4 IMPLANT
KIT BASIN OR (CUSTOM PROCEDURE TRAY) ×4 IMPLANT
KIT OSTOMY DRAINABLE 2.75 STR (WOUND CARE) ×2 IMPLANT
KIT ROOM TURNOVER OR (KITS) ×4 IMPLANT
LIGASURE IMPACT 36 18CM CVD LR (INSTRUMENTS) IMPLANT
NDL HYPO 25GX1X1/2 BEV (NEEDLE) ×2 IMPLANT
NEEDLE HYPO 25GX1X1/2 BEV (NEEDLE) ×4 IMPLANT
NS IRRIG 1000ML POUR BTL (IV SOLUTION) ×8 IMPLANT
PACK GENERAL/GYN (CUSTOM PROCEDURE TRAY) ×4 IMPLANT
PACK SURGICAL SETUP 50X90 (CUSTOM PROCEDURE TRAY) ×4 IMPLANT
PAD ARMBOARD 7.5X6 YLW CONV (MISCELLANEOUS) ×8 IMPLANT
PENCIL BUTTON HOLSTER BLD 10FT (ELECTRODE) ×4 IMPLANT
SPECIMEN JAR LARGE (MISCELLANEOUS) IMPLANT
SPECIMEN JAR SMALL (MISCELLANEOUS) IMPLANT
SPONGE GAUZE 4X4 12PLY (GAUZE/BANDAGES/DRESSINGS) ×2 IMPLANT
SPONGE INTESTINAL PEANUT (DISPOSABLE) IMPLANT
SPONGE LAP 18X18 X RAY DECT (DISPOSABLE) ×4 IMPLANT
STAPLER VISISTAT 35W (STAPLE) ×4 IMPLANT
SUCTION POOLE TIP (SUCTIONS) ×4 IMPLANT
SUT MNCRL AB 4-0 PS2 18 (SUTURE) ×4 IMPLANT
SUT PDS AB 0 CT 36 (SUTURE) IMPLANT
SUT PDS AB 1 TP1 96 (SUTURE) ×8 IMPLANT
SUT PROLENE 2 0 SH DA (SUTURE) ×4 IMPLANT
SUT SILK 0 FSL (SUTURE) ×2 IMPLANT
SUT SILK 2 0 SH (SUTURE) ×2 IMPLANT
SUT SILK 2 0 SH CR/8 (SUTURE) ×4 IMPLANT
SUT SILK 2 0 TIES 10X30 (SUTURE) ×4 IMPLANT
SUT SILK 3 0 (SUTURE) ×4
SUT SILK 3 0 SH CR/8 (SUTURE) ×4 IMPLANT
SUT SILK 3 0 TIES 10X30 (SUTURE) ×4 IMPLANT
SUT SILK 3-0 18XBRD TIE 12 (SUTURE) ×3 IMPLANT
SUT VIC AB 0 CT2 27 (SUTURE) IMPLANT
SUT VIC AB 2-0 CT1 36 (SUTURE) ×2 IMPLANT
SUT VIC AB 2-0 SH 27 (SUTURE) ×12
SUT VIC AB 2-0 SH 27X BRD (SUTURE) ×3 IMPLANT
SUT VIC AB 2-0 SH 27XBRD (SUTURE) ×2 IMPLANT
SUT VIC AB 3-0 SH 27 (SUTURE) ×4
SUT VIC AB 3-0 SH 27XBRD (SUTURE) ×3 IMPLANT
SUT VIC AB 3-0 X1 27 (SUTURE) ×2 IMPLANT
SYR CONTROL 10ML LL (SYRINGE) ×4 IMPLANT
TAPE CLOTH SURG 6X10 WHT LF (GAUZE/BANDAGES/DRESSINGS) ×2 IMPLANT
TISSUE MATRIX STRATTICE 10X10 (Tissue) ×2 IMPLANT
TOWEL OR 17X24 6PK STRL BLUE (TOWEL DISPOSABLE) ×4 IMPLANT
TOWEL OR 17X26 10 PK STRL BLUE (TOWEL DISPOSABLE) ×4 IMPLANT
TRAY FOLEY CATH 16FR SILVER (SET/KITS/TRAYS/PACK) IMPLANT
TRAY FOLEY CATH 16FRSI W/METER (SET/KITS/TRAYS/PACK) IMPLANT
TUBE CONNECTING 12X1/4 (SUCTIONS) IMPLANT
YANKAUER SUCT BULB TIP NO VENT (SUCTIONS) IMPLANT

## 2013-11-08 NOTE — Preoperative (Signed)
Beta Blockers   Reason not to administer Beta Blockers:Not Applicable 

## 2013-11-08 NOTE — H&P (Signed)
I have seen and examined the pt and agree with PA-Jeffery's  H&P note. Check INR, will trx FFP if needed To OR for [facility] after INR WNL All risks and benefits were discussed with the patient, to generally include infection, bleeding, damage to surrounding structures, acute and chronic nerve pain, and recurrence. Alternatives were offered and described.  All questions were answered and the patient voiced understanding of the procedure and wishes to proceed at this point.

## 2013-11-08 NOTE — Anesthesia Postprocedure Evaluation (Signed)
  Anesthesia Post-op Note  Patient: Zachary [name]  Procedure(s) Performed: Procedure(s): inguinal hernia repair with biologic mesh (Right) ORCHIECTOMY (Right)  Patient Location: PACU  Anesthesia Type:General  Level of Consciousness: awake, alert  and oriented  Airway and Oxygen Therapy: Patient Spontanous Breathing and Patient connected to nasal cannula oxygen  Post-op Pain: mild  Post-op Assessment: Post-op Vital signs reviewed, Patient's Cardiovascular Status Stable, Respiratory Function Stable, Patent Airway, No signs of Nausea or vomiting and Pain level controlled  Post-op Vital Signs: stable  Complications: No apparent anesthesia complications

## 2013-11-08 NOTE — Anesthesia Preprocedure Evaluation (Addendum)
Anesthesia Evaluation  Patient identified by MRN, date of birth, ID band Patient awake    Reviewed: Allergy & Precautions, H&P , NPO status , Patient's Chart, lab work & pertinent test results  Airway Mallampati: II  Neck ROM: Full    Dental  (+) Upper Dentures, Lower Dentures and Dental Advisory Given   Pulmonary shortness of breath and with exertion, COPD COPD inhaler, Current Smoker,  + rhonchi   + decreased breath sounds      Cardiovascular hypertension, + dysrhythmias Rhythm:Irregular Rate:Normal     Neuro/Psych PSYCHIATRIC DISORDERS Anxiety Depression    GI/Hepatic hiatal hernia, (+) Cirrhosis -       , Hepatitis -, B  Endo/Other    Renal/GU      Musculoskeletal   Abdominal   Peds  Hematology  (+) anemia ,   Anesthesia Other Findings   Reproductive/Obstetrics                       Anesthesia Physical Anesthesia Plan  ASA: III  Anesthesia Plan: General   Post-op Pain Management:    Induction: Intravenous, Rapid sequence and Cricoid pressure planned  Airway Management Planned:   Additional Equipment:   Intra-op Plan:   Post-operative Plan: Possible Post-op intubation/ventilation  Informed Consent: I have reviewed the patients History and Physical, chart, labs and discussed the procedure including the risks, benefits and alternatives for the proposed anesthesia with the patient or authorized representative who has indicated his/her understanding and acceptance.   Dental advisory given  Plan Discussed with: CRNA, Anesthesiologist and Surgeon  Anesthesia Plan Comments: (Incarcerated R. Inguinal hernia H/O aFib on coumadin PT/INR 2.3 CAD per records Nuclear stress testt 11/20/12  No evidence of myocardial ischemia or infarction.Normal left ventricular wall motion..  Estimated Q G S ejection fraction 55%. Smoker Htn H/O Hepatitis B ETH abuse  Plan GA with RSI  Kipp Brood, MD)      Anesthesia Quick Evaluation

## 2013-11-08 NOTE — Progress Notes (Signed)
Patient requested something for pain Dr. Noreene Larsson notified orders received.

## 2013-11-08 NOTE — Progress Notes (Signed)
Late entry D; transferred pt to unit via bed by Rapid responds RN.

## 2013-11-08 NOTE — ED Notes (Signed)
Patient presents to ED via EMS from [facility] with complaints of hernia, redness and fever.

## 2013-11-08 NOTE — Transfer of Care (Signed)
Immediate Anesthesia Transfer of Care Note  Patient: Zachary [name]  Procedure(s) Performed: Procedure(s): inguinal hernia repair with biologic mesh (Right) ORCHIECTOMY (Right)  Patient Location: PACU  Anesthesia Type:General  Level of Consciousness: awake and alert   Airway & Oxygen Therapy: Patient Spontanous Breathing and Patient connected to nasal cannula oxygen  Post-op Assessment: Report given to PACU -  Yu RN  Post vital signs: Reviewed and stable  Complications: No apparent anesthesia complications

## 2013-11-08 NOTE — H&P (Signed)
Zachary [name] is an 72 y.o. male.   Chief Complaint: Pelvic pain HPI: Zachary [name] is well known to [place]. He presents with a subacute exacerbation of a long-standing right inguinal hernia. He notes that it has been swollen and more painful for about 2 weeks. He denies fever, chills, sweats, N/V. He was seen by NP and MD at the nursing home but not referred for acute management.  Past Medical History  Diagnosis Date  . Hypertension   . Coronary artery disease   . Hyperlipemia   . Angina   . Shortness of breath   . Mental disorder   . Arthritis   . COPD (chronic obstructive pulmonary disease)   . Hiatal hernia   . Depression   . Gout   . Joint pain   . Depression   . Anxiety   . Ileus, postoperative 09/29/2013  . PAF (paroxysmal atrial fibrillation) 09/29/2013    On lopressor and Cardizem at home, no anticoagulation.  . S/P colostomy     Past Surgical History  Procedure Laterality Date  . Abdominal surgery    . Laparotomy N/A 09/26/2013    Procedure: EXPLORATORY LAPAROTOMY;  Surgeon: Liz Malady, MD;  Location: [facility] OR;  Service: General;  Laterality: N/A;    History reviewed. No pertinent family history. Social History:  reports that he has been smoking Cigarettes.  He has a 15 pack-year smoking history. He has quit using smokeless tobacco. He reports that he drinks about 7.2 ounces of alcohol per week. He reports that he does not use illicit drugs.  Allergies: No Known Allergies   (Not in a hospital admission)  Results for orders placed during the hospital encounter of 11/08/13 (from the past 48 hour(s))  COMPREHENSIVE METABOLIC PANEL     Status: Abnormal   Collection Time    11/08/13  9:30 AM      Result Value Range   Sodium 135  135 - 145 mEq/L   Potassium 3.8  3.5 - 5.1 mEq/L   Chloride 99  96 - 112 mEq/L   CO2 30  19 - 32 mEq/L   Glucose, Bld 96  70 - 99 mg/dL   BUN 14  6 - 23 mg/dL   Creatinine, Ser 8.46  0.50 - 1.35 mg/dL   Calcium 9.1  8.4 - 96.2 mg/dL   Total Protein 7.2  6.0 - 8.3 g/dL   Albumin 2.3 (*) 3.5 - 5.2 g/dL   AST 21  0 - 37 U/L   ALT 15  0 - 53 U/L   Alkaline Phosphatase 87  39 - 117 U/L   Total Bilirubin 0.5  0.3 - 1.2 mg/dL   GFR calc non Af Amer 82 (*) >90 mL/min   GFR calc Af Amer >90  >90 mL/min   Comment: (NOTE)     The eGFR has been calculated using the CKD EPI equation.     This calculation has not been validated in all clinical situations.     eGFR's persistently <90 mL/min signify possible Chronic Kidney     Disease.  CBC WITH DIFFERENTIAL     Status: Abnormal   Collection Time    11/08/13  9:30 AM      Result Value Range   WBC 17.5 (*) 4.0 - 10.5 K/uL   RBC 4.03 (*) 4.22 - 5.81 MIL/uL   Hemoglobin 10.4 (*) 13.0 - 17.0 g/dL   HCT 95.2 (*) 84.1 - 32.4 %   MCV 82.9  78.0 - 100.0 fL  MCH 25.8 (*) 26.0 - 34.0 pg   MCHC 31.1  30.0 - 36.0 g/dL   RDW 96.0 (*) 45.4 - 09.8 %   Platelets 321  150 - 400 K/uL   Neutrophils Relative % 78 (*) 43 - 77 %   Neutro Abs 13.7 (*) 1.7 - 7.7 K/uL   Lymphocytes Relative 13  12 - 46 %   Lymphs Abs 2.2  0.7 - 4.0 K/uL   Monocytes Relative 9  3 - 12 %   Monocytes Absolute 1.6 (*) 0.1 - 1.0 K/uL   Eosinophils Relative 0  0 - 5 %   Eosinophils Absolute 0.0  0.0 - 0.7 K/uL   Basophils Relative 0  0 - 1 %   Basophils Absolute 0.0  0.0 - 0.1 K/uL  URINALYSIS, ROUTINE W REFLEX MICROSCOPIC     Status: Abnormal   Collection Time    11/08/13  9:40 AM      Result Value Range   Color, Urine YELLOW  YELLOW   APPearance CLEAR  CLEAR   Specific Gravity, Urine 1.021  1.005 - 1.030   pH 7.5  5.0 - 8.0   Glucose, UA NEGATIVE  NEGATIVE mg/dL   Hgb urine dipstick NEGATIVE  NEGATIVE   Bilirubin Urine NEGATIVE  NEGATIVE   Ketones, ur NEGATIVE  NEGATIVE mg/dL   Protein, ur NEGATIVE  NEGATIVE mg/dL   Urobilinogen, UA 4.0 (*) 0.0 - 1.0 mg/dL   Nitrite NEGATIVE  NEGATIVE   Leukocytes, UA NEGATIVE  NEGATIVE   Comment: MICROSCOPIC NOT DONE ON URINES WITH NEGATIVE PROTEIN, BLOOD, LEUKOCYTES,  NITRITE, OR GLUCOSE <1000 mg/dL.   No results found.  Review of Systems  Constitutional: Negative for fever, chills, weight loss and diaphoresis.  Respiratory: Negative for cough and shortness of breath.   Cardiovascular: Negative for chest pain.  Gastrointestinal: Positive for abdominal pain. Negative for nausea and vomiting.  Genitourinary: Positive for urgency and frequency.  Musculoskeletal: Positive for myalgias (Right thigh).  All other systems reviewed and are negative.    Blood pressure 121/61, pulse 61, temperature 98.4 F (36.9 C), temperature source Oral, resp. rate 18, SpO2 90.00%. Physical Exam  Constitutional: He appears well-developed. He appears cachectic. No distress.  HENT:  Head: Normocephalic and atraumatic.  Right Ear: External ear normal.  Left Ear: External ear normal.  Mouth/Throat: No oropharyngeal exudate.  Eyes: Conjunctivae are normal. Pupils are equal, round, and reactive to light. Right eye exhibits no discharge. Left eye exhibits no discharge. No scleral icterus.  Neck: Normal range of motion. Neck supple.  Cardiovascular: Normal rate, normal heart sounds and intact distal pulses.  Exam reveals no gallop and no friction rub.   No murmur heard. Respiratory: Effort normal. No respiratory distress. He has wheezes. He has no rales. He exhibits no tenderness.  GI: Soft. He exhibits no distension. There is no tenderness. There is no rebound and no guarding. A hernia is present. Hernia confirmed positive in the right inguinal area.  Genitourinary: Right testis shows tenderness.  Musculoskeletal: Normal range of motion. He exhibits no edema and no tenderness.  Lymphadenopathy:    He has no cervical adenopathy.  Neurological: He is alert.  Skin: Skin is warm and dry. He is not diaphoretic.  Psychiatric: He has a normal mood and affect. His behavior is normal. Judgment and thought content normal.     Assessment/Plan Incarcerated [facility] -- Will need operative  intervention at this point. Checking a stat INR, may need to have his coumadin reversed. His  medical problems were evaluated by MD this past week and noted to be stable so I don't think IM needs to admit. Risks and benefits of surgery were explained to the patient who wishes to proceed.    Freeman Caldron, PA-C Pager: [phone] 11/08/2013, 11:05 AM

## 2013-11-08 NOTE — Op Note (Signed)
11/08/2013  7:05 PM  PATIENT:  Zachary [name]  72 y.o. male  PRE-OPERATIVE DIAGNOSIS:  Incarcerated R IH  POST-OPERATIVE DIAGNOSIS:  Incarcerated, purulent indirect right inguinal hernia, with strangulated omentum  PROCEDURE:  Procedure(s): HERNIA REPAIR INGUINAL ADULT (Right) repair with biologic mesh, and right orchiectomy  SURGEON:  Surgeon(s) and Role:    * Axel Filler, MD - Primary    ASSISTANTS: none   ANESTHESIA:   general  EBL:     BLOOD ADMINISTERED:none  DRAINS: none   LOCAL MEDICATIONS USED:  MARCAINE     SPECIMEN:  Source of Specimen:  Right hernia sac, right testicle  DISPOSITION OF SPECIMEN:  PATHOLOGY  COUNTS:  YES  TOURNIQUET:  * No tourniquets in log *  DICTATION: .Dragon Dictation  Details of the procedure: The patient was taken back to the operating room. The patient was placed in supine position with bilateral SCDs in place.  His colostomy was sutured closed with a figure-of-eight silk suture. The patient was then prepped and draped in the usual sterile fashion. The colostomy site had a Tegaderm and 4 x 4 gauze to protect it from the operative field. After appropriate anitbiotics were confirmed, a time-out was confirmed and all facts were verified.  Quarter percent Marcaine was used to infiltrate the area of the incision.  A 5 cm incision was made just 1 cm superior to the inguinal ligament. Bovie cautery was used to maintain hemostasis dissection is carried down to the external oblique.  A standard incision was made laterally, and the external oblique was bluntly dissected away from the surrounding tissue with Metzenbaum scissors. Incision was made through the external oblique and lateral stab wound through the external ring. The external oblique was elevated in the spermatic cord was bluntly dissected away from the surrounding tissue.  The ilioinguinal nerve was identified and ligated proximally with a 2-0 Vicryl.   The spermatic cord and the hernia  were then bluntly dissected away from the pubic tubercle and a Penrose was placed around the hernia sac in the spermatic cord.  the hernia contents were very tense. There was a large amount of chronic inflammation to the hernia sac, and dense adhesions to the hernia sac. Upon mobilizing the hernia sac from the scrotum the hernia sac burst with purulent fluid. It was approximately 75 cc of milky foul-smelling purulence. At this time the hernia sac was blunted dissected away from the surrounding tissue. There was also a large amount of blood loss at this time approximately 100 cc. Secondary to the thickness of the hernia sac and the chronic scarring of the testicular vessels , it was decided to sacrifice the right testicle. The testicular vessels were then doubly ligated using a goal silk. The vas deferens was ligated using a 2-0 Vicryl. The testicle was then sent to pathology. There was a large amount of necrotic/ischemic omentum in the hernia sac. This was debrided away. There was not any communication that could be palpated to be abdominal cavity. At this time the hernia was ligated using 2-0 Vicryl in a running fashion. This retracted to the abdomen appropriately. The floor of the hernia was then reapproximated using a 2-0 running Vicryl.  The air was irrigated out with several liters saline.  At this time a piece of Stratus 10 x 10 cm biologic mesh was then cut to shape. This was anchored to the pubic tube we'll use a 2-0 Prolene. This was run along the shelving edge of the external oblique. An along  the conjoint tendon superiorly. The lateral portion was brought up to the conjoint tendon and tied down. A 2-0 Vicryl was used to reapproximate the processes vaginalis.   At this time I checked for hemostasis which was excellent this portion of the case. A 2-0 Vicryl was then used to reapproximate the external oblique fascia. A 3:00 was used in a running fashion the reapproximate Scarpa's fascia. The skin was left  open with a wet-to-dry dressing.  The patient tolerated the procedure well was taken to recovery in stable condition.   PLAN OF CARE: Admit to inpatient   PATIENT DISPOSITION:  PACU - hemodynamically stable.   Delay start of Pharmacological VTE agent (>24hrs) due to surgical blood loss or risk of bleeding: yes

## 2013-11-08 NOTE — Progress Notes (Signed)
Per Dr. Derrell Lolling at bedside request FFP to go in at fastest rate possible rate increased to 999 mL/ hr per order

## 2013-11-08 NOTE — ED Notes (Signed)
Pt undressed, in gown, on continuous pulse oximetry and blood pressure cuff; warm blanket given 

## 2013-11-08 NOTE — Anesthesia Procedure Notes (Signed)
Procedure Name: Intubation Date/Time: 11/08/2013 5:47 PM Performed by: Brien Mates D Pre-anesthesia Checklist: Patient identified, Emergency Drugs available, Suction available, Timeout performed and Patient being monitored Patient Re-evaluated:Patient Re-evaluated prior to inductionOxygen Delivery Method: Circle system utilized Preoxygenation: Pre-oxygenation with 100% oxygen Intubation Type: IV induction, Rapid sequence and Cricoid Pressure applied Laryngoscope Size: Miller and 2 Grade View: Grade I Tube type: Oral Tube size: 8.0 mm Number of attempts: 1 Airway Equipment and Method: Stylet Placement Confirmation: ETT inserted through vocal cords under direct vision,  positive ETCO2 and breath sounds checked- equal and bilateral Secured at: 22 cm Tube secured with: Tape Dental Injury: Teeth and Oropharynx as per pre-operative assessment

## 2013-11-09 LAB — PREPARE FRESH FROZEN PLASMA: Unit division: 0

## 2013-11-09 LAB — CBC
HCT: 31.4 % — ABNORMAL LOW (ref 39.0–52.0)
Hemoglobin: 9.6 g/dL — ABNORMAL LOW (ref 13.0–17.0)
MCV: 82.8 fL (ref 78.0–100.0)
Platelets: 286 10*3/uL (ref 150–400)
RBC: 3.79 MIL/uL — ABNORMAL LOW (ref 4.22–5.81)
RDW: 15.9 % — ABNORMAL HIGH (ref 11.5–15.5)
WBC: 18.1 10*3/uL — ABNORMAL HIGH (ref 4.0–10.5)

## 2013-11-09 LAB — BASIC METABOLIC PANEL
BUN: 12 mg/dL (ref 6–23)
CO2: 29 mEq/L (ref 19–32)
Chloride: 98 mEq/L (ref 96–112)
Creatinine, Ser: 0.91 mg/dL (ref 0.50–1.35)
GFR calc Af Amer: >90 mL/min (ref >90)
Glucose, Bld: 73 mg/dL (ref 70–99)
Potassium: 4.1 mEq/L (ref 3.5–5.1)

## 2013-11-09 LAB — MRSA PCR SCREENING: MRSA by PCR: NEGATIVE

## 2013-11-09 MED ORDER — ADULT MULTIVITAMIN W/MINERALS CH
1.0000 | ORAL_TABLET | Freq: Every day | ORAL | Status: DC
  Administered 2013-11-09 – 2013-11-13 (×5): 1 via ORAL
  Filled 2013-11-09 (×5): qty 1

## 2013-11-09 MED ORDER — LORAZEPAM 1 MG PO TABS
1.0000 mg | ORAL_TABLET | Freq: Four times a day (QID) | ORAL | Status: DC | PRN
  Administered 2013-11-11 – 2013-11-12 (×3): 1 mg via ORAL
  Filled 2013-11-09 (×4): qty 1

## 2013-11-09 MED ORDER — LORAZEPAM 2 MG/ML IJ SOLN
1.0000 mg | Freq: Four times a day (QID) | INTRAMUSCULAR | Status: DC | PRN
  Administered 2013-11-11 – 2013-11-13 (×3): 1 mg via INTRAVENOUS
  Filled 2013-11-09 (×3): qty 1

## 2013-11-09 MED ORDER — VITAMIN B-1 100 MG PO TABS
100.0000 mg | ORAL_TABLET | Freq: Every day | ORAL | Status: DC
  Administered 2013-11-09 – 2013-11-13 (×5): 100 mg via ORAL
  Filled 2013-11-09 (×5): qty 1

## 2013-11-09 MED ORDER — FOLIC ACID 1 MG PO TABS
1.0000 mg | ORAL_TABLET | Freq: Every day | ORAL | Status: DC
  Administered 2013-11-09 – 2013-11-13 (×5): 1 mg via ORAL
  Filled 2013-11-09 (×6): qty 1

## 2013-11-09 MED ORDER — THIAMINE HCL 100 MG/ML IJ SOLN
100.0000 mg | Freq: Every day | INTRAMUSCULAR | Status: DC
  Filled 2013-11-09 (×5): qty 1

## 2013-11-09 NOTE — Progress Notes (Signed)
D/c'd foley at 1515 per MD order. Pt tolerated procedure well. Condom cath applied. Will continue to monitor.

## 2013-11-09 NOTE — ED Provider Notes (Signed)
CSN: [id number]     Arrival date & time 11/08/13  4098 History   First MD Initiated Contact with Patient 11/08/13 [phone]     Chief Complaint  Patient presents with  . Hernia    HPI Patient presented from nursing home with increasing pain, tenderness, or redness to right inguinal hernia which been present for some time.  Patient states that the pain and swelling has gotten larger.  Patient denies vomiting or bowel obstruction symptoms.  Patient has long complicated history of abdominal surgeries with colostomy which is currently draining. Past Medical History  Diagnosis Date  . Hypertension   . Coronary artery disease   . Hyperlipemia   . Angina   . Shortness of breath   . Mental disorder   . Arthritis   . COPD (chronic obstructive pulmonary disease)   . Hiatal hernia   . Depression   . Gout   . Joint pain   . Depression   . Anxiety   . Ileus, postoperative 09/29/2013  . PAF (paroxysmal atrial fibrillation) 09/29/2013    On lopressor and Cardizem at home, no anticoagulation.  . S/P colostomy    Past Surgical History  Procedure Laterality Date  . Abdominal surgery    . Laparotomy N/A 09/26/2013    Procedure: EXPLORATORY LAPAROTOMY;  Surgeon: Liz Malady, MD;  Location: [facility] OR;  Service: General;  Laterality: N/A;   History reviewed. No pertinent family history. History  Substance Use Topics  . Smoking status: Current Every Day Smoker -- 1.00 packs/day for 15 years    Types: Cigarettes  . Smokeless tobacco: Former Neurosurgeon  . Alcohol Use: 7.2 oz/week    12 Cans of beer per week     Comment: pt heavy drinker, pt stets at lest 2-3 40oz per day    Review of Systems  Constitutional: Negative for fever and chills.  All other systems reviewed and are negative.    Allergies  Review of patient's allergies indicates no known allergies.  Home Medications  No current outpatient prescriptions on file. BP 130/64  Pulse 86  Temp(Src) 100.2 F (37.9 C) (Oral)  Resp 23  Ht  5' 6.53" (1.69 m)  Wt 146 lb 9.7 oz (66.5 kg)  BMI 23.28 kg/m2  SpO2 100% Physical Exam  Nursing note and vitals reviewed. Constitutional: He is oriented to person, place, and time. He appears well-developed and well-nourished. No distress.  HENT:  Head: Normocephalic and atraumatic.  Eyes: Pupils are equal, round, and reactive to light.  Neck: Normal range of motion.  Cardiovascular: Normal rate and intact distal pulses.   Pulmonary/Chest: No respiratory distress.  Abdominal: Normal appearance and bowel sounds are normal. He exhibits no distension. There is tenderness. A hernia (Right inguinal which is prominent with surrounding erythema and tender to exam.) is present.  Musculoskeletal: Normal range of motion.  Neurological: He is alert and oriented to person, place, and time. No cranial nerve deficit.  Skin: Skin is warm and dry. No rash noted.  Psychiatric: He has a normal mood and affect. His behavior is normal.    ED Course  Procedures (including critical care time) Labs Review Labs Reviewed  COMPREHENSIVE METABOLIC PANEL - Abnormal; Notable for the following:    Albumin 2.3 (*)    GFR calc non Af Amer 82 (*)    All other components within normal limits  CBC WITH DIFFERENTIAL - Abnormal; Notable for the following:    WBC 17.5 (*)    RBC 4.03 (*)  Hemoglobin 10.4 (*)    HCT 33.4 (*)    MCH 25.8 (*)    RDW 15.8 (*)    Neutrophils Relative % 78 (*)    Neutro Abs 13.7 (*)    Monocytes Absolute 1.6 (*)    All other components within normal limits  URINALYSIS, ROUTINE W REFLEX MICROSCOPIC - Abnormal; Notable for the following:    Urobilinogen, UA 4.0 (*)    All other components within normal limits  PROTIME-INR - Abnormal; Notable for the following:    Prothrombin Time 23.7 (*)    INR 2.20 (*)    All other components within normal limits  CBC - Abnormal; Notable for the following:    WBC 19.4 (*)    Hemoglobin 11.4 (*)    HCT 35.5 (*)    RDW 15.9 (*)    All other  components within normal limits  CBC - Abnormal; Notable for the following:    WBC 18.1 (*)    RBC 3.79 (*)    Hemoglobin 9.6 (*)    HCT 31.4 (*)    MCH 25.3 (*)    RDW 15.9 (*)    All other components within normal limits  BASIC METABOLIC PANEL - Abnormal; Notable for the following:    GFR calc non Af Amer 84 (*)    All other components within normal limits  PROTIME-INR - Abnormal; Notable for the following:    Prothrombin Time 22.3 (*)    INR 2.03 (*)    All other components within normal limits  MRSA PCR SCREENING  TYPE AND SCREEN  PREPARE FRESH FROZEN PLASMA   general surgeon with consult in for evaluation.  After evaluation I felt patient needed to go to the operating room for reduction of incarcerated hernia.  Medications  albuterol (PROVENTIL HFA;VENTOLIN HFA) 108 (90 BASE) MCG/ACT inhaler 2 puff (not administered)  heparin injection 5,000 Units (not administered)  0.45 % NaCl with KCl 20 mEq / L infusion ( Intravenous New Bag/Given 11/08/13 2258)  ondansetron (ZOFRAN) injection 4 mg (not administered)  fentaNYL (SUBLIMAZE) 0.05 MG/ML injection (not administered)  ertapenem (INVANZ) 1 g in sodium chloride 0.9 % 50 mL IVPB (1 g Intravenous Given 11/08/13 2130)  HYDROmorphone (DILAUDID) injection 1 mg (1 mg Intravenous Given 11/09/13 0402)  ondansetron (ZOFRAN) injection 4 mg (not administered)  pantoprazole (PROTONIX) injection 40 mg (not administered)  HYDROmorphone (DILAUDID) 1 MG/ML injection (not administered)  HYDROmorphone (DILAUDID) 1 MG/ML injection (not administered)  ondansetron (ZOFRAN) injection 4 mg (4 mg Intravenous Given 11/08/13 0936)  fentaNYL (SUBLIMAZE) injection 50 mcg (50 mcg Intravenous Given 11/08/13 0936)  fentaNYL (SUBLIMAZE) injection 100 mcg (100 mcg Intravenous Given 11/08/13 1230)  fentaNYL (SUBLIMAZE) injection 100 mcg (100 mcg Intravenous Given 11/08/13 1620)  ceFAZolin (ANCEF) 2-3 GM-% IVPB SOLR (2 g Intravenous Given 11/08/13 1731)    Imaging  Review No results found.    MDM   1. Incarcerated hernia        Nelia Shi, MD 11/09/13 [phone]

## 2013-11-09 NOTE — Plan of Care (Signed)
Problem: Phase II Progression Outcomes Goal: Pain controlled Outcome: Progressing Pt says he has minimal pain  1/10 at this time will continue to monitor Goal: Vital signs stable Outcome: Progressing vss will continue to monitor Goal: Surgical site without signs of infection Outcome: Progressing Surgical site has some oozing , dressing was reinforced in pacu will continue to monitor Goal: Dressings dry/intact Outcome: Progressing Some shadowing noted will continue to monitor

## 2013-11-09 NOTE — Progress Notes (Addendum)
1 Day Post-Op  Subjective: Pt feels rough.   Objective: Vital signs in last 24 hours: Temp:  [98.3 F (36.8 C)-101.7 F (38.7 C)] 100.2 F (37.9 C) (12/07 0736) Pulse Rate:  [30-97] 86 (12/07 0736) Resp:  [15-26] 23 (12/07 0736) BP: (99-154)/(38-78) 130/64 mmHg (12/07 0736) SpO2:  [83 %-100 %] 100 % (12/07 0736) Weight:  [146 lb 9.7 oz (66.5 kg)] 146 lb 9.7 oz (66.5 kg) (12/06 1943) Last BM Date: 11/07/13  Intake/Output from previous day: 12/06 0701 - 12/07 0700 In: 3823.3 [I.V.:3350; Blood:473.3] Out: 1550 [Urine:1450; Blood:100] Intake/Output this shift: Total I/O In: -  Out: 200 [Urine:200]  Incision/Wound:right groin dressing with serosanguinous drainage. Ostomy pink and viable.Marland Kitchenabdomen soft ND no peritonitis.   Lab Results:   Recent Labs  11/08/13 2000 11/09/13 0520  WBC 19.4* 18.1*  HGB 11.4* 9.6*  HCT 35.5* 31.4*  PLT 293 286   BMET  Recent Labs  11/08/13 0930 11/09/13 0520  NA 135 135  K 3.8 4.1  CL 99 98  CO2 30 29  GLUCOSE 96 73  BUN 14 12  CREATININE 0.96 0.91  CALCIUM 9.1 8.5   PT/INR  Recent Labs  11/08/13 1120 11/09/13 0520  LABPROT 23.7* 22.3*  INR 2.20* 2.03*   ABG No results found for this basename: PHART, PCO2, PO2, HCO3,  in the last 72 hours  Studies/Results: No results found.  Anti-infectives: Anti-infectives   Start     Dose/Rate Route Frequency Ordered Stop   11/08/13 2000  ertapenem (INVANZ) 1 g in sodium chloride 0.9 % 50 mL IVPB     1 g 100 mL/hr over 30 Minutes Intravenous Every 24 hours 11/08/13 1924     11/08/13 1707  ceFAZolin (ANCEF) 2-3 GM-% IVPB SOLR    Comments:  Brien Mates   : cabinet override      11/08/13 1707 11/08/13 1731      Assessment/Plan: s/p Procedure(s): inguinal hernia repair with biologic mesh (Right) ORCHIECTOMY (Right) Keep in SDU  Clear liquid diet Hold coumadin until Monday D/C foley am Monday for strict I and O management OOB.  Patient Active Problem List   Diagnosis  Date Noted  . Incarcerated inguinal hernia 11/08/2013  . Right inguinal hernia 11/08/2013  . Coronary atherosclerosis of native coronary artery 11/07/2013  . S/P partial colectomy 10/22/2013  . Inguinal hernia 10/13/2013  . Ileus, postoperative 09/29/2013  . PAF (paroxysmal atrial fibrillation) 09/29/2013  . Respiratory failure, post-operative 09/26/2013  . Perforation of sigmoid colon 09/26/2013  . Macrocytosis 08/25/2013  . Atrial fibrillation 08/12/2013  . Severe protein-calorie malnutrition 07/09/2013  . Pneumonia 07/09/2013  . Traumatic hemothorax 07/01/2013  . Acute blood loss anemia 07/01/2013  . Fall 06/23/2013  . Multiple fractures of ribs of left side 06/23/2013  . Alcohol dependence with acute alcoholic intoxication 02/26/2012    Class: Acute  . Alcohol dependence 10/17/2011  . HEPATITIS B 12/01/2008  . HYPERTENSION 12/01/2008  . COPD UNSPECIFIED 12/01/2008  . HEPATIC CIRRHOSIS, ALCOHOLIC 12/01/2008  . ARTHRITIS 12/01/2008  . HERNIA, HX OF 12/01/2008    LOS: 1 day    Everson Mott A. 11/09/2013

## 2013-11-10 LAB — COMPREHENSIVE METABOLIC PANEL
Albumin: 1.9 g/dL — ABNORMAL LOW (ref 3.5–5.2)
Alkaline Phosphatase: 73 U/L (ref 39–117)
BUN: 10 mg/dL (ref 6–23)
CO2: 28 mEq/L (ref 19–32)
Calcium: 8.4 mg/dL (ref 8.4–10.5)
Chloride: 98 mEq/L (ref 96–112)
GFR calc Af Amer: >90 mL/min (ref >90)
Glucose, Bld: 79 mg/dL (ref 70–99)
Potassium: 4.3 mEq/L (ref 3.5–5.1)
Total Bilirubin: 0.7 mg/dL (ref 0.3–1.2)

## 2013-11-10 LAB — CBC
HCT: 28.4 % — ABNORMAL LOW (ref 39.0–52.0)
Hemoglobin: 8.9 g/dL — ABNORMAL LOW (ref 13.0–17.0)
RBC: 3.52 MIL/uL — ABNORMAL LOW (ref 4.22–5.81)
RDW: 15.7 % — ABNORMAL HIGH (ref 11.5–15.5)
WBC: 15.8 10*3/uL — ABNORMAL HIGH (ref 4.0–10.5)

## 2013-11-10 LAB — PROTIME-INR: Prothrombin Time: 19.7 seconds — ABNORMAL HIGH (ref 11.6–15.2)

## 2013-11-10 MED ORDER — WARFARIN - PHARMACIST DOSING INPATIENT: Freq: Every day | Status: DC

## 2013-11-10 MED ORDER — WARFARIN SODIUM 2 MG PO TABS
2.0000 mg | ORAL_TABLET | Freq: Once | ORAL | Status: AC
  Administered 2013-11-10: 2 mg via ORAL
  Filled 2013-11-10 (×2): qty 1

## 2013-11-10 NOTE — Progress Notes (Signed)
ANTICOAGULATION CONSULT NOTE - Initial Consult  Pharmacy Consult for Coumadin Indication: atrial fibrillation  No Known Allergies  Patient Measurements: Height: 5' 6.53" (169 cm) Weight: 146 lb 9.7 oz (66.5 kg) IBW/kg (Calculated) : 65.03 Heparin Dosing Weight: n/a  Vital Signs: Temp: 98 F (36.7 C) (12/08 1153) Temp src: Oral (12/08 0709) BP: 140/70 mmHg (12/08 1153) Pulse Rate: 91 (12/08 0709)  Labs:  Recent Labs  11/08/13 0930 11/08/13 1120 11/08/13 2000 11/09/13 0520 11/10/13 0050  HGB 10.4*  --  11.4* 9.6* 8.9*  HCT 33.4*  --  35.5* 31.4* 28.4*  PLT 321  --  293 286 287  LABPROT  --  23.7*  --  22.3* 19.7*  INR  --  2.20*  --  2.03* 1.72*  CREATININE 0.96  --   --  0.91 0.90    Estimated Creatinine Clearance: 70.2 ml/min (by C-G formula based on Cr of 0.9).   Medical History: Past Medical History  Diagnosis Date  . Hypertension   . Coronary artery disease   . Hyperlipemia   . Angina   . Shortness of breath   . Mental disorder   . Arthritis   . COPD (chronic obstructive pulmonary disease)   . Hiatal hernia   . Depression   . Gout   . Joint pain   . Depression   . Anxiety   . Ileus, postoperative 09/29/2013  . PAF (paroxysmal atrial fibrillation) 09/29/2013    On lopressor and Cardizem at home, no anticoagulation.  . S/P colostomy     Medications:  Prescriptions prior to admission  Medication Sig Dispense Refill  . acetaminophen (TYLENOL) 325 MG tablet Take 650 mg by mouth every 6 (six) hours as needed.      Marland Kitchen albuterol (PROVENTIL HFA;VENTOLIN HFA) 108 (90 BASE) MCG/ACT inhaler Inhale 2 puffs into the lungs every 6 (six) hours as needed for wheezing.      . Amino Acids-Protein Hydrolys (FEEDING SUPPLEMENT, PRO-STAT SUGAR FREE 64,) LIQD Take 30 mLs by mouth 2 (two) times daily.      Marland Kitchen diltiazem (DILACOR XR) 120 MG 24 hr capsule Take 1 capsule (120 mg total) by mouth daily.      Marland Kitchen dronabinol (MARINOL) 2.5 MG capsule Take 1 capsule (2.5 mg total)  by mouth 2 (two) times daily before lunch and supper.  60 capsule  5  . gabapentin (NEURONTIN) 100 MG capsule Take 100 mg by mouth 3 (three) times daily.      Marland Kitchen lamoTRIgine (LAMICTAL) 25 MG tablet Take 50 mg by mouth at bedtime.      . metaxalone (SKELAXIN) 800 MG tablet Take 800 mg by mouth 3 (three) times daily as needed for muscle spasms.      . metoprolol succinate (TOPROL-XL) 25 MG 24 hr tablet Take 1 tablet (25 mg total) by mouth every evening.      Marland Kitchen oxyCODONE (ROXICODONE) 5 MG/5ML solution Administer 5-10 ml (5-10 mg) by mouth every 4 hours as needed  50 mL  0  . pantoprazole (PROTONIX) 40 MG tablet Take 40 mg by mouth daily.      . traMADol (ULTRAM) 50 MG tablet Take 50-100 mg by mouth every 6 (six) hours as needed for pain.      . traZODone (DESYREL) 100 MG tablet Take 100 mg by mouth at bedtime.      . traZODone (DESYREL) 50 MG tablet Take 50-100 mg by mouth every 6 (six) hours as needed for sleep (for pain). *takes an extra  50mg  if need help sleeping      . Vitamin D, Ergocalciferol, (DRISDOL) 50000 UNITS CAPS capsule Take 50,000 Units by mouth every 30 (thirty) days.      Marland Kitchen warfarin (COUMADIN) 4 MG tablet Take 4 mg by mouth daily at 6 PM.      . naproxen (NAPROSYN) 500 MG tablet         Assessment: 72 yo M s/p inguinal hernia repair with mesh 12/6. Coumadin was held for surgery. INR is currently subtherapeutic at 1.72 on post-op day 2. H/H has trended down to 8.9/28.4, Plt remain stable. Per RN, patient not eating well. On clear liquid diets. Will dose coumadin conservatively. Talked to patient today who reports no signs of bleeding.   Home coumadin dose: 4 mg PO daily   Goal of Therapy:  INR 2-3 Monitor platelets by anticoagulation protocol: Yes   Plan:  1) Coumadin 2 mg x 1 dose tonight since patient not eating 2) F/u daily INR, CBC, and s/s of bleeding   Vinnie Level, PharmD.  Clinical Pharmacist Pager [phone]

## 2013-11-10 NOTE — Clinical Social Work Psychosocial (Signed)
Clinical Social Work Department BRIEF PSYCHOSOCIAL ASSESSMENT 11/10/2013  Patient:  Schueller,Donn     Account Number:  [id number]     Admit date:  11/08/2013  Clinical Social Worker:  Lavell Luster  Date/Time:  11/10/2013 03:39 PM  Referred by:  Physician  Date Referred:  11/10/2013 Referred for  SNF Placement   Other Referral:   Interview type:  Patient Other interview type:   Patient alert and oriented at time of assessment.    PSYCHOSOCIAL DATA Living Status:  FACILITY Admitted from facility:  [facility] Level of care:  Skilled Nursing Facility Primary support name:  Lorina Rabon Primary support relationship to patient:  SIBLING Degree of support available:   Support is adequate.    CURRENT CONCERNS Current Concerns  Post-Acute Placement   Other Concerns:    SOCIAL WORK ASSESSMENT / PLAN CSW met with patient at bedside to discuss discharge plan. Patient is admitted from [facility]. Patient plans to return to [facility] upon discharge. Patient states that he has family and friends in the area, but has nowhere else to live besides [employer]. CSW will continue to follow.   Assessment/plan status:  Psychosocial Support/Ongoing Assessment of Needs Other assessment/ plan:   Complete FL2, Fax   Information/referral to community resources:   CSW contact information given to patient.    PATIENT'S/FAMILY'S RESPONSE TO PLAN OF CARE: Patient plans to return to [facility] upon DC. Patient was pleasant and appreciative of CSW contact. CSW will continue to assist in DC needs.      Roddie Mc, [place], [place], [phone]

## 2013-11-10 NOTE — Progress Notes (Signed)
Patient ID: Zachary [name], male   DOB: [birth year], 72 y.o.   MRN: [id number]  General Surgery - [facility] - Progress Note  POD# 2  Subjective: Patient up in chair.  Complains of pain when he coughs.  Not eating very much per nursing - on clear liquids.  Objective: Vital signs in last 24 hours: Temp:  [98.3 F (36.8 C)-101.4 F (38.6 C)] 98.3 F (36.8 C) (12/08 0709) Pulse Rate:  [87-221] 91 (12/08 0709) Resp:  [21-31] 31 (12/08 0709) BP: (132-139)/(62-78) 139/78 mmHg (12/08 0709) SpO2:  [98 %-100 %] 98 % (12/08 0709) Last BM Date: 11/07/13  Intake/Output from previous day: 12/07 0701 - 12/08 0700 In: 2420 [P.O.:120; I.V.:2300] Out: 1375 [Urine:1375]  Exam: HEENT - clear, not icteric Abd - mild distension; right groin wound with separation of skin edges, no drainage, dressing changed Ext - no significant edema Neuro - grossly intact, no focal deficits  Lab Results:   Recent Labs  11/09/13 0520 11/10/13 0050  WBC 18.1* 15.8*  HGB 9.6* 8.9*  HCT 31.4* 28.4*  PLT 286 287     Recent Labs  11/09/13 0520 11/10/13 0050  NA 135 131*  K 4.1 4.3  CL 98 98  CO2 29 28  GLUCOSE 73 79  BUN 12 10  CREATININE 0.91 0.90  CALCIUM 8.5 8.4    Studies/Results: No results found.  Assessment / Plan: 1.  Status post repair incarcerated RIH with biologic mesh secondary to suspected infection  Follow wound closely  Continue clear liquid diet today  Transfer from stepdown to floor 6N today  Encourage OOB, ambulation  Velora Heckler, MD, [facility] Office: [phone]  11/10/2013

## 2013-11-10 NOTE — Progress Notes (Signed)
Report called to 6N-belongs packed and sent with patient, VSS, meds current to time.

## 2013-11-10 NOTE — Care Management Note (Signed)
    Page 1 of 1   11/10/2013     10:46:19 AM   CARE MANAGEMENT NOTE 11/10/2013  Patient:  Mcintyre,Jozef   Account Number:  [id number]  Date Initiated:  11/10/2013  Documentation initiated by:  Donn Pierini  Subjective/Objective Assessment:   Pt admitted s/p inguinal hernia repair     Action/Plan:   PTA pt was at [facility]- CSW following for d/c needs to return to SNF   Anticipated DC Date:  11/12/2013   Anticipated DC Plan:  SKILLED NURSING FACILITY  In-house referral  Clinical Social Worker      DC Planning Services  CM consult      Choice offered to / List presented to:             Status of service:  In process, will continue to follow Medicare Important Message given?   (If response is "NO", the following Medicare IM given date fields will be blank) Date Medicare IM given:   Date Additional Medicare IM given:    Discharge Disposition:    Per UR Regulation:  Reviewed for med. necessity/level of care/duration of stay  If discussed at Long Length of Stay Meetings, dates discussed:    Comments:

## 2013-11-11 ENCOUNTER — Encounter (HOSPITAL_COMMUNITY): Payer: Self-pay | Admitting: General Surgery

## 2013-11-11 LAB — PROTIME-INR: Prothrombin Time: 17.7 seconds — ABNORMAL HIGH (ref 11.6–15.2)

## 2013-11-11 MED ORDER — ENOXAPARIN SODIUM 40 MG/0.4ML ~~LOC~~ SOLN
40.0000 mg | SUBCUTANEOUS | Status: DC
  Administered 2013-11-11 – 2013-11-13 (×3): 40 mg via SUBCUTANEOUS
  Filled 2013-11-11 (×4): qty 0.4

## 2013-11-11 MED ORDER — HYDROCODONE-ACETAMINOPHEN 5-325 MG PO TABS
1.0000 | ORAL_TABLET | ORAL | Status: DC | PRN
  Administered 2013-11-11 – 2013-11-13 (×6): 2 via ORAL
  Filled 2013-11-11 (×6): qty 2

## 2013-11-11 MED ORDER — WARFARIN SODIUM 4 MG PO TABS
4.0000 mg | ORAL_TABLET | Freq: Once | ORAL | Status: AC
  Administered 2013-11-11: 4 mg via ORAL
  Filled 2013-11-11: qty 1

## 2013-11-11 NOTE — Clinical Social Work Note (Signed)
CSW received handoff from pt's previous unit CSW. Pt is to be discharged to [facility] once medically stable for discharge from [facility]. CSW contacted admissions coordinator with [facility] on 11/11/2013 to inform facility of pt's possible discharge for 11/12/2013. CSW to continue to follow and assist with discharge planning needs.  Darlyn Chamber, LCSWA Clinical Social Worker [phone]

## 2013-11-11 NOTE — Progress Notes (Signed)
ANTICOAGULATION CONSULT NOTE - Follow Up Consult  Pharmacy Consult for Coumadin Indication: atrial fibrillation  No Known Allergies  Patient Measurements: Height: 5' 6.53" (169 cm) Weight: 146 lb 9.7 oz (66.5 kg) IBW/kg (Calculated) : 65.03  Vital Signs: Temp: 98.3 F (36.8 C) (12/09 0503) Temp src: Oral (12/09 0503) BP: 134/59 mmHg (12/09 0503) Pulse Rate: 71 (12/09 0503)  Labs:  Recent Labs  11/08/13 2000 11/09/13 0520 11/10/13 0050 11/11/13 0635  HGB 11.4* 9.6* 8.9*  --   HCT 35.5* 31.4* 28.4*  --   PLT 293 286 287  --   LABPROT  --  22.3* 19.7* 17.7*  INR  --  2.03* 1.72* 1.50*  CREATININE  --  0.91 0.90  --     Estimated Creatinine Clearance: 70.2 ml/min (by C-G formula based on Cr of 0.9).   Medications:  Prescriptions prior to admission  Medication Sig Dispense Refill  . acetaminophen (TYLENOL) 325 MG tablet Take 650 mg by mouth every 6 (six) hours as needed.      Marland Kitchen albuterol (PROVENTIL HFA;VENTOLIN HFA) 108 (90 BASE) MCG/ACT inhaler Inhale 2 puffs into the lungs every 6 (six) hours as needed for wheezing.      . Amino Acids-Protein Hydrolys (FEEDING SUPPLEMENT, PRO-STAT SUGAR FREE 64,) LIQD Take 30 mLs by mouth 2 (two) times daily.      Marland Kitchen diltiazem (DILACOR XR) 120 MG 24 hr capsule Take 1 capsule (120 mg total) by mouth daily.      Marland Kitchen dronabinol (MARINOL) 2.5 MG capsule Take 1 capsule (2.5 mg total) by mouth 2 (two) times daily before lunch and supper.  60 capsule  5  . gabapentin (NEURONTIN) 100 MG capsule Take 100 mg by mouth 3 (three) times daily.      Marland Kitchen lamoTRIgine (LAMICTAL) 25 MG tablet Take 50 mg by mouth at bedtime.      . metaxalone (SKELAXIN) 800 MG tablet Take 800 mg by mouth 3 (three) times daily as needed for muscle spasms.      . metoprolol succinate (TOPROL-XL) 25 MG 24 hr tablet Take 1 tablet (25 mg total) by mouth every evening.      Marland Kitchen oxyCODONE (ROXICODONE) 5 MG/5ML solution Administer 5-10 ml (5-10 mg) by mouth every 4 hours as needed  50 mL   0  . pantoprazole (PROTONIX) 40 MG tablet Take 40 mg by mouth daily.      . traMADol (ULTRAM) 50 MG tablet Take 50-100 mg by mouth every 6 (six) hours as needed for pain.      . traZODone (DESYREL) 100 MG tablet Take 100 mg by mouth at bedtime.      . traZODone (DESYREL) 50 MG tablet Take 50-100 mg by mouth every 6 (six) hours as needed for sleep (for pain). *takes an extra 50mg  if need help sleeping      . Vitamin D, Ergocalciferol, (DRISDOL) 50000 UNITS CAPS capsule Take 50,000 Units by mouth every 30 (thirty) days.      Marland Kitchen warfarin (COUMADIN) 4 MG tablet Take 4 mg by mouth daily at 6 PM.      . naproxen (NAPROSYN) 500 MG tablet         Assessment: 72 yo M s/p inguinal hernia repair with mesh 12/6. Coumadin was held for surgery. INR decreased to 1.5 - post-op day 3. H/H trended down post-op, Plts remain stable. Per surgery note, patient is tolerating diet however no intake amount is charted. No overt bleeding noted. Also on prophylactic Lovenox. Home coumadin  dose: 4 mg PO daily   Goal of Therapy:  INR 2-3 Monitor platelets by anticoagulation protocol: Yes   Plan:  1. Warfarin 4mg  po x1 tonight 2. CBC tomorrow morning, daily INR 3. Follow for s/s bleeding  Zayaan Kozak D. Euell Schiff, PharmD, BCPS Clinical Pharmacist Pager: [phone] 11/11/2013 10:34 AM

## 2013-11-11 NOTE — Progress Notes (Signed)
3 Days Post-Op  Subjective: Pt doing well.  +flatus in bag, no ostomy output yet.  Tolerating clear liquid diet.  Minimal pain.  No N/V.  Ambulating OOB, currently up to chair.    Objective: Vital signs in last 24 hours: Temp:  [98 F (36.7 C)-98.7 F (37.1 C)] 98.3 F (36.8 C) (12/09 0503) Pulse Rate:  [71-97] 71 (12/09 0503) Resp:  [20-25] 20 (12/09 0503) BP: (130-192)/(57-80) 134/59 mmHg (12/09 0503) SpO2:  [90 %-99 %] 96 % (12/09 0503) Last BM Date: 11/07/13  Intake/Output from previous day: 12/08 0701 - 12/09 0700 In: 2880 [P.O.:480; I.V.:2400] Out: 1775 [Urine:1775] Intake/Output this shift:    PE: Gen:  Alert, NAD, pleasant Abd: Soft, mild tenderness, ND, +BS, no HSM, incisions C/D/I, ostomy with flatus, but no BM in bag yet  Lab Results:   Recent Labs  11/09/13 0520 11/10/13 0050  WBC 18.1* 15.8*  HGB 9.6* 8.9*  HCT 31.4* 28.4*  PLT 286 287   BMET  Recent Labs  11/09/13 0520 11/10/13 0050  NA 135 131*  K 4.1 4.3  CL 98 98  CO2 29 28  GLUCOSE 73 79  BUN 12 10  CREATININE 0.91 0.90  CALCIUM 8.5 8.4   PT/INR  Recent Labs  11/10/13 0050 11/11/13 0635  LABPROT 19.7* 17.7*  INR 1.72* 1.50*   CMP     Component Value Date/Time   NA 131* 11/10/2013 0050   K 4.3 11/10/2013 0050   CL 98 11/10/2013 0050   CO2 28 11/10/2013 0050   GLUCOSE 79 11/10/2013 0050   BUN 10 11/10/2013 0050   CREATININE 0.90 11/10/2013 0050   CALCIUM 8.4 11/10/2013 0050   PROT 6.2 11/10/2013 0050   ALBUMIN 1.9* 11/10/2013 0050   AST 18 11/10/2013 0050   ALT 10 11/10/2013 0050   ALKPHOS 73 11/10/2013 0050   BILITOT 0.7 11/10/2013 0050   GFRNONAA 84* 11/10/2013 0050   GFRAA >90 11/10/2013 0050   Lipase     Component Value Date/Time   LIPASE 32 09/25/2013 2030       Studies/Results: No results found.  Anti-infectives: Anti-infectives   Start     Dose/Rate Route Frequency Ordered Stop   11/08/13 2000  ertapenem (INVANZ) 1 g in sodium chloride 0.9 % 50 mL IVPB     1  g 100 mL/hr over 30 Minutes Intravenous Every 24 hours 11/08/13 1924     11/08/13 1707  ceFAZolin (ANCEF) 2-3 GM-% IVPB SOLR    Comments:  Brien Mates   : cabinet override      11/08/13 1707 11/08/13 1731       Assessment/Plan POD #3 Status post repair incarcerated RIH with biologic mesh secondary to suspected infection, right orchiectomy 1.  Follow wound closely  2.  Advance to full liquid diet today  3.  Encourage OOB, ambulation and IS 4.  SCD's and coumadin (day 2), with lovenox bridge 5.  Home when tolerating regular diet and having BM's, hopefully tomorrow?    LOS: 3 days    Aris Georgia 11/11/2013, 8:19 AM Pager: [phone]

## 2013-11-11 NOTE — Progress Notes (Signed)
I have seen and examined the pt and agree with PA-Dort's progress note. Adv diet as tol Encourage ambulation Home soon

## 2013-11-12 DIAGNOSIS — F10939 Alcohol use, unspecified with withdrawal, unspecified: Secondary | ICD-10-CM

## 2013-11-12 DIAGNOSIS — F10239 Alcohol dependence with withdrawal, unspecified: Secondary | ICD-10-CM

## 2013-11-12 LAB — CBC
HCT: 28.6 % — ABNORMAL LOW (ref 39.0–52.0)
Platelets: 288 10*3/uL (ref 150–400)
RBC: 3.49 MIL/uL — ABNORMAL LOW (ref 4.22–5.81)
RDW: 16.4 % — ABNORMAL HIGH (ref 11.5–15.5)
WBC: 8.3 10*3/uL (ref 4.0–10.5)

## 2013-11-12 LAB — PROTIME-INR
INR: 1.43 (ref 0.00–1.49)
Prothrombin Time: 17.1 seconds — ABNORMAL HIGH (ref 11.6–15.2)

## 2013-11-12 MED ORDER — SPIRITUS FRUMENTI
1.0000 | Freq: Three times a day (TID) | ORAL | Status: DC
  Administered 2013-11-12 (×3): 1 via ORAL
  Filled 2013-11-12 (×9): qty 1

## 2013-11-12 MED ORDER — WARFARIN SODIUM 4 MG PO TABS
4.0000 mg | ORAL_TABLET | Freq: Once | ORAL | Status: AC
  Administered 2013-11-12: 4 mg via ORAL
  Filled 2013-11-12: qty 1

## 2013-11-12 NOTE — Progress Notes (Signed)
ANTICOAGULATION CONSULT NOTE - Follow Up Consult  Pharmacy Consult for Coumadin Indication: atrial fibrillation  No Known Allergies  Patient Measurements: Height: 5' 6.53" (169 cm) Weight: 146 lb 9.7 oz (66.5 kg) IBW/kg (Calculated) : 65.03  Vital Signs: Temp: 97.3 F (36.3 C) (12/10 0453) Temp src: Oral (12/10 0453) BP: 137/65 mmHg (12/10 0453) Pulse Rate: 92 (12/10 0453)  Labs:  Recent Labs  11/10/13 0050 11/11/13 0635 11/12/13 0657  HGB 8.9*  --  9.0*  HCT 28.4*  --  28.6*  PLT 287  --  288  LABPROT 19.7* 17.7* 17.1*  INR 1.72* 1.50* 1.43  CREATININE 0.90  --   --     Estimated Creatinine Clearance: 70.2 ml/min (by C-G formula based on Cr of 0.9).   Assessment: 72 yo M s/p inguinal hernia repair with mesh 12/6. Coumadin was held for surgery. INR decreased to 1.4 - post-op day 4. Hgb and plts remain stable post-op. Per surgery note, patient is tolerating diet however no intake amount is charted. No overt bleeding noted. Also on prophylactic Lovenox. Home coumadin dose: 4 mg PO daily  Noted patient has become more agitated and is requiring Ativan for CIWA protocol. Surgery has prescribed 1 beer TID.  Goal of Therapy:  INR 2-3 Monitor platelets by anticoagulation protocol: Yes   Plan:  1. Warfarin 4mg  po x1 tonight- thought INR has decreased, alcohol can potentiate INR 2. Daily INR and CBC 3. Follow for s/s bleeding  Marvie Calender D. Jazara Swiney, PharmD, BCPS Clinical Pharmacist Pager: [phone] 11/12/2013 9:57 AM

## 2013-11-12 NOTE — Progress Notes (Signed)
General Surgery [facility]  Patient seen and examined.  OOB.  Wants to go home.  Pleasantly confused.  Actively managing for alcohol withdrawal.  Plan discharge to SNF soon.  Coumadin started with Lovenox bridge.  Advancing diet.  Velora Heckler, MD, [facility] Office: [phone]

## 2013-11-12 NOTE — Progress Notes (Signed)
4 Days Post-Op  Subjective: Nurses note he's more confused, pulled out IV twice.  CIWA 8-9.  Getting ativan.  Tolerating diet.  No BM yet.  Hungry.  Last time he required beer for alcohol withdrawal.  Appears to be withdrawaling now as well.    Objective: Vital signs in last 24 hours: Temp:  [97.3 F (36.3 C)-98.2 F (36.8 C)] 97.3 F (36.3 C) (12/10 0453) Pulse Rate:  [87-92] 92 (12/10 0453) Resp:  [20] 20 (12/10 0453) BP: (137-149)/(65-82) 137/65 mmHg (12/10 0453) SpO2:  [92 %-97 %] 97 % (12/10 0453) Last BM Date: 11/07/13  Intake/Output from previous day: 12/09 0701 - 12/10 0700 In: 2273.3 [I.V.:2273.3] Out: 950 [Urine:950] Intake/Output this shift:    PE: Gen:  Alert, NAD, pleasant Abd: Soft, minimally distended, minimal tenderness over incision in RLQ/groin, skin edges are separated, dry gauze overtop, +BS, no HSM, no significant drainage  Lab Results:   Recent Labs  11/10/13 0050 11/12/13 0657  WBC 15.8* 8.3  HGB 8.9* 9.0*  HCT 28.4* 28.6*  PLT 287 288   BMET  Recent Labs  11/10/13 0050  NA 131*  K 4.3  CL 98  CO2 28  GLUCOSE 79  BUN 10  CREATININE 0.90  CALCIUM 8.4   PT/INR  Recent Labs  11/11/13 0635 11/12/13 0657  LABPROT 17.7* 17.1*  INR 1.50* 1.43   CMP     Component Value Date/Time   NA 131* 11/10/2013 0050   K 4.3 11/10/2013 0050   CL 98 11/10/2013 0050   CO2 28 11/10/2013 0050   GLUCOSE 79 11/10/2013 0050   BUN 10 11/10/2013 0050   CREATININE 0.90 11/10/2013 0050   CALCIUM 8.4 11/10/2013 0050   PROT 6.2 11/10/2013 0050   ALBUMIN 1.9* 11/10/2013 0050   AST 18 11/10/2013 0050   ALT 10 11/10/2013 0050   ALKPHOS 73 11/10/2013 0050   BILITOT 0.7 11/10/2013 0050   GFRNONAA 84* 11/10/2013 0050   GFRAA >90 11/10/2013 0050   Lipase     Component Value Date/Time   LIPASE 32 09/25/2013 2030       Studies/Results: No results found.  Anti-infectives: Anti-infectives   Start     Dose/Rate Route Frequency Ordered Stop   11/08/13 2000   ertapenem (INVANZ) 1 g in sodium chloride 0.9 % 50 mL IVPB     1 g 100 mL/hr over 30 Minutes Intravenous Every 24 hours 11/08/13 1924     11/08/13 1707  ceFAZolin (ANCEF) 2-3 GM-% IVPB SOLR    Comments:  Brien Mates   : cabinet override      11/08/13 1707 11/08/13 1731       Assessment/Plan POD #4 Status post repair incarcerated RIH with biologic mesh secondary to suspected infection, right orchiectomy  Alcohol Withdrawal ABL anemia - stable Anticoagulation - INR 1.43 today, adding beer so may need adjusted, pharmacy dosing  1. Follow wound closely  2. Cont full liquids until bowel function returns 3. Encourage OOB, ambulation and IS  4. SCD's and coumadin (day 3), with lovenox bridge  5. He's scoring 8-9 on CIWA and requiring ativan, start 1 beer TID 6. Home when tolerating regular diet and having BM's, and confusion improved     LOS: 4 days    DORT, Shaylen Nephew 11/12/2013, 7:42 AM Pager: [phone]

## 2013-11-13 LAB — PROTIME-INR: INR: 2.49 — ABNORMAL HIGH (ref 0.00–1.49)

## 2013-11-13 MED ORDER — DRONABINOL 2.5 MG PO CAPS
2.5000 mg | ORAL_CAPSULE | Freq: Two times a day (BID) | ORAL | Status: DC
  Administered 2013-11-13: 2.5 mg via ORAL
  Filled 2013-11-13: qty 1

## 2013-11-13 MED ORDER — TRAZODONE HCL 100 MG PO TABS
100.0000 mg | ORAL_TABLET | Freq: Every day | ORAL | Status: DC
  Filled 2013-11-13: qty 1

## 2013-11-13 MED ORDER — TRAMADOL HCL 50 MG PO TABS: 50.0000 mg | ORAL_TABLET | Freq: Four times a day (QID) | ORAL | Status: DC | PRN

## 2013-11-13 MED ORDER — GABAPENTIN 100 MG PO CAPS
100.0000 mg | ORAL_CAPSULE | Freq: Three times a day (TID) | ORAL | Status: DC
  Administered 2013-11-13 (×2): 100 mg via ORAL
  Filled 2013-11-13 (×2): qty 1

## 2013-11-13 MED ORDER — ACETAMINOPHEN 325 MG PO TABS
650.0000 mg | ORAL_TABLET | Freq: Four times a day (QID) | ORAL | Status: DC | PRN
  Administered 2013-11-13: 650 mg via ORAL
  Filled 2013-11-13: qty 2

## 2013-11-13 MED ORDER — WARFARIN SODIUM 2 MG PO TABS
2.0000 mg | ORAL_TABLET | Freq: Once | ORAL | Status: AC
  Administered 2013-11-13: 2 mg via ORAL
  Filled 2013-11-13: qty 1

## 2013-11-13 MED ORDER — PANTOPRAZOLE SODIUM 40 MG PO TBEC
40.0000 mg | DELAYED_RELEASE_TABLET | Freq: Every day | ORAL | Status: DC
  Administered 2013-11-13: 40 mg via ORAL
  Filled 2013-11-13: qty 1

## 2013-11-13 MED ORDER — LAMOTRIGINE 25 MG PO TABS
50.0000 mg | ORAL_TABLET | Freq: Every day | ORAL | Status: DC
  Filled 2013-11-13: qty 2

## 2013-11-13 MED ORDER — METOPROLOL SUCCINATE ER 25 MG PO TB24
25.0000 mg | ORAL_TABLET | Freq: Every evening | ORAL | Status: DC
Start: 2013-11-13 — End: 2013-11-13

## 2013-11-13 MED ORDER — DILTIAZEM HCL ER 120 MG PO CP24
120.0000 mg | ORAL_CAPSULE | Freq: Every day | ORAL | Status: DC
  Administered 2013-11-13: 120 mg via ORAL
  Filled 2013-11-13: qty 1

## 2013-11-13 MED ORDER — TRAMADOL HCL 50 MG PO TABS
50.0000 mg | ORAL_TABLET | Freq: Four times a day (QID) | ORAL | Status: DC | PRN
Start: 2013-11-13 — End: 2013-11-13
  Administered 2013-11-13: 100 mg via ORAL
  Filled 2013-11-13: qty 2

## 2013-11-13 NOTE — H&P (Signed)
Patient transferred to [facility], report given to [facility], Charity fundraiser. Patient was alert and oriented X 1-2 prior to transfer.

## 2013-11-13 NOTE — Progress Notes (Signed)
ANTICOAGULATION CONSULT NOTE - Follow Up Consult  Pharmacy Consult for Coumadin Indication: atrial fibrillation  No Known Allergies  Patient Measurements: Height: 5' 6.53" (169 cm) Weight: 146 lb 9.7 oz (66.5 kg) IBW/kg (Calculated) : 65.03  Vital Signs: Temp: 98.1 F (36.7 C) (12/11 0522) Temp src: Oral (12/11 0522) BP: 130/68 mmHg (12/11 0522) Pulse Rate: 68 (12/11 0522)  Labs:  Recent Labs  11/11/13 0635 11/12/13 0657 11/13/13 0435  HGB  --  9.0*  --   HCT  --  28.6*  --   PLT  --  288  --   LABPROT 17.7* 17.1* 26.1*  INR 1.50* 1.43 2.49*    Estimated Creatinine Clearance: 70.2 ml/min (by C-G formula based on Cr of 0.9).   Assessment: 72 yo M s/p inguinal hernia repair with mesh 12/6. Coumadin was held for surgery. INR increased dramatically today to 2.49. Hgb and plts remain stable post-op. Per surgery note, patient is tolerating diet however no intake amount is charted. No overt bleeding noted. Also on prophylactic Lovenox. Beer has been stopped as patient cannot go to SNF on it. Home coumadin dose: 4 mg PO daily   Goal of Therapy:  INR 2-3 Monitor platelets by anticoagulation protocol: Yes   Plan:  1. Warfarin 2mg  po x1 tonight with drastic increase in INR 2. Daily INR and CBC 3. Follow for s/s bleeding and dispo  Samie Reasons D. Crissa Sowder, PharmD, BCPS Clinical Pharmacist Pager: [phone] 11/13/2013 12:25 PM

## 2013-11-13 NOTE — Clinical Social Work Note (Signed)
CSW has completed discharge packet and placed on shadow chart. Discharge paperwork has been faxed to [facility]. CSW contacted pt's sister regarding pt's return to [facility] on 11/13/2013. [facility] has been called to transport pt to SNF.  Darlyn Chamber, LCSWA Clinical Social Worker [phone]

## 2013-11-13 NOTE — Discharge Summary (Signed)
Physician Discharge Summary  Patient ID: Zachary [name] MRN: [id number] DOB/AGE: 04-03-1941 72 y.o.  Admit date: 11/08/2013 Discharge date: 11/13/2013  Admitting Diagnosis: Incarcerated right inguinal hernia S/p partial colectomy and colostomy Delerium /? ETOH withdrawal   Discharge Diagnosis Patient Active Problem List   Diagnosis Date Noted  . Incarcerated inguinal hernia 11/08/2013  . Right inguinal hernia 11/08/2013  . Coronary atherosclerosis of native coronary artery 11/07/2013  . S/P partial colectomy 10/22/2013  . Inguinal hernia 10/13/2013  . Ileus, postoperative 09/29/2013  . PAF (paroxysmal atrial fibrillation) 09/29/2013  . Respiratory failure, post-operative 09/26/2013  . Perforation of sigmoid colon 09/26/2013  . Macrocytosis 08/25/2013  . Atrial fibrillation 08/12/2013  . Severe protein-calorie malnutrition 07/09/2013  . Pneumonia 07/09/2013  . Traumatic hemothorax 07/01/2013  . Acute blood loss anemia 07/01/2013  . Fall 06/23/2013  . Multiple fractures of ribs of left side 06/23/2013  . Alcohol dependence with acute alcoholic intoxication 02/26/2012    Class: Acute  . Alcohol dependence 10/17/2011  . HEPATITIS B 12/01/2008  . HYPERTENSION 12/01/2008  . COPD UNSPECIFIED 12/01/2008  . HEPATIC CIRRHOSIS, ALCOHOLIC 12/01/2008  . ARTHRITIS 12/01/2008  . HERNIA, HX OF 12/01/2008    Consultants None  Imaging: No results found.  Procedures Dr. Derrell Lolling (11/08/13) - HERNIA REPAIR INGUINAL ADULT (Right) repair with biologic mesh, and right orchiectomy  Hospital Course:  72 y/o male presents with a subacute exacerbation of a long-standing right inguinal hernia. He notes that it has been swollen and more painful for about 2 weeks. He denies fever, chills, sweats, N/V. He was seen by NP and MD at the nursing home [facility]), but not referred for acute management.  Workup showed Incarcerated right inguinal hernia.  Patient was admitted and underwent procedure  listed above.  He was found to have an incarcerated purulent indirect right inguinal hernia with strangulated omentum.  Therefore he required a right orchiectomy in addition to the hernia repair and mesh.  Tolerated procedure well and was transferred to the floor.  Diet was advanced as tolerated.  On POD #4 his colostomy started to put out stool.  On POD#4, the patient was voiding well, tolerating diet, ambulating well, pain well controlled, vital signs stable, incisions c/d/i and felt stable for discharge to SNF ([facility]).  Patient will follow up in our office in 2-3 weeks and knows to call with questions or concerns.  He will require wet to dry dressing changes to his Right groin wound BID until the skin edges are more approximated then dry dressings until eschar forms.  Monitor closely for signs of infection.  After discussing with pharmacy he can discontinue his Lovenox bridge.  He will get his 2mg  dosing of coumadin prior to discharge and his home dose of 4mg  can be resumed tomorrow as usual.  He will need his INR checked daily until stable on 4mg  dosage, then per protocols of the facility.       Medication List         acetaminophen 325 MG tablet  Commonly known as:  TYLENOL  Take 650 mg by mouth every 6 (six) hours as needed.     albuterol 108 (90 BASE) MCG/ACT inhaler  Commonly known as:  PROVENTIL HFA;VENTOLIN HFA  Inhale 2 puffs into the lungs every 6 (six) hours as needed for wheezing.     diltiazem 120 MG 24 hr capsule  Commonly known as:  DILACOR XR  Take 1 capsule (120 mg total) by mouth daily.  dronabinol 2.5 MG capsule  Commonly known as:  MARINOL  Take 1 capsule (2.5 mg total) by mouth 2 (two) times daily before lunch and supper.     feeding supplement (PRO-STAT SUGAR FREE 64) Liqd  Take 30 mLs by mouth 2 (two) times daily.     gabapentin 100 MG capsule  Commonly known as:  NEURONTIN  Take 100 mg by mouth 3 (three) times daily.     lamoTRIgine 25 MG tablet   Commonly known as:  LAMICTAL  Take 50 mg by mouth at bedtime.     metaxalone 800 MG tablet  Commonly known as:  SKELAXIN  Take 800 mg by mouth 3 (three) times daily as needed for muscle spasms.     metoprolol succinate 25 MG 24 hr tablet  Commonly known as:  TOPROL-XL  Take 1 tablet (25 mg total) by mouth every evening.     naproxen 500 MG tablet  Commonly known as:  NAPROSYN     oxyCODONE 5 MG/5ML solution  Commonly known as:  ROXICODONE  Administer 5-10 ml (5-10 mg) by mouth every 4 hours as needed     pantoprazole 40 MG tablet  Commonly known as:  PROTONIX  Take 40 mg by mouth daily.     traMADol 50 MG tablet  Commonly known as:  ULTRAM  Take 1-2 tablets (50-100 mg total) by mouth every 6 (six) hours as needed for moderate pain or severe pain.     traZODone 50 MG tablet  Commonly known as:  DESYREL  Take 50-100 mg by mouth every 6 (six) hours as needed for sleep (for pain). *takes an extra 50mg  if need help sleeping     traZODone 100 MG tablet  Commonly known as:  DESYREL  Take 100 mg by mouth at bedtime.     Vitamin D (Ergocalciferol) 50000 UNITS Caps capsule  Commonly known as:  DRISDOL  Take 50,000 Units by mouth every 30 (thirty) days.     warfarin 4 MG tablet  Commonly known as:  COUMADIN  Take 4 mg by mouth daily at 6 PM.             Follow-up Information   Follow up with Lajean Saver, MD. Schedule an appointment as soon as possible for a visit in 2 weeks.   Specialty:  General Surgery   Contact information:   [address] [place] [address] [phone]       Follow up with Aura Dials, MD.   Specialty:  Family Medicine   Contact information:   [address] [place] [address] [phone]       Future Appointments Date Time Provider Department Center  12/11/2013 2:00 PM Liz Malady, MD [facility] None    Signed: Aris Georgia [facility] [phone]  11/13/2013, 2:30 PM

## 2013-11-13 NOTE — Discharge Summary (Signed)
General Surgery [facility]  Agree with discharge summary.  FL2 signed.  Follow up at [facility] office arranged.  Velora Heckler, MD, [facility] Office: [phone]

## 2013-11-13 NOTE — Progress Notes (Signed)
Made aware that the patient can not go back to SNF with beer, so will d/c this and continue ativan PO PRN.  He had 2 sips of his beer this am before discontinued.

## 2013-11-13 NOTE — Progress Notes (Signed)
General Surgery [facility]  Patient seen.  Agree with plans for discharge to care facility with wound care.  Follow up at [facility] office to be arranged.  Velora Heckler, MD, [facility] Office: [phone]

## 2013-11-13 NOTE — Progress Notes (Signed)
5 Days Post-Op  Subjective: Pt doing well.  Was a bit confused last night and required 1 dose of ativan.  Improved this am.  CIWA improved on beer.  No pain N/V.  Tolerating diet well and having BM's in colostomy bag.    Objective: Vital signs in last 24 hours: Temp:  [97.6 F (36.4 C)-98.1 F (36.7 C)] 98.1 F (36.7 C) (12/11 0522) Pulse Rate:  [66-95] 68 (12/11 0522) Resp:  [20] 20 (12/11 0522) BP: (130-162)/(55-70) 130/68 mmHg (12/11 0522) SpO2:  [95 %-99 %] 98 % (12/11 0522) Last BM Date: 11/07/13  Intake/Output from previous day: 12/10 0701 - 12/11 0700 In: 1736.7 [P.O.:240; I.V.:1246.7; IV Piggyback:250] Out: 2625 [Urine:2625] Intake/Output this shift:    PE: Gen:  Alert, NAD, pleasant Abd: Soft, ND, minimal tenderness over incision in RLQ/groin, skin edges are separated, dry gauze overtop, +BS, no HSM, no significant drainage, ostomy with brownish yellow output and flatus    Lab Results:   Recent Labs  11/12/13 0657  WBC 8.3  HGB 9.0*  HCT 28.6*  PLT 288   BMET No results found for this basename: NA, K, CL, CO2, GLUCOSE, BUN, CREATININE, CALCIUM,  in the last 72 hours PT/INR  Recent Labs  11/12/13 0657 11/13/13 0435  LABPROT 17.1* 26.1*  INR 1.43 2.49*   CMP     Component Value Date/Time   NA 131* 11/10/2013 0050   K 4.3 11/10/2013 0050   CL 98 11/10/2013 0050   CO2 28 11/10/2013 0050   GLUCOSE 79 11/10/2013 0050   BUN 10 11/10/2013 0050   CREATININE 0.90 11/10/2013 0050   CALCIUM 8.4 11/10/2013 0050   PROT 6.2 11/10/2013 0050   ALBUMIN 1.9* 11/10/2013 0050   AST 18 11/10/2013 0050   ALT 10 11/10/2013 0050   ALKPHOS 73 11/10/2013 0050   BILITOT 0.7 11/10/2013 0050   GFRNONAA 84* 11/10/2013 0050   GFRAA >90 11/10/2013 0050   Lipase     Component Value Date/Time   LIPASE 32 09/25/2013 2030       Studies/Results: No results found.  Anti-infectives: Anti-infectives   Start     Dose/Rate Route Frequency Ordered Stop   11/08/13 2000  ertapenem  (INVANZ) 1 g in sodium chloride 0.9 % 50 mL IVPB     1 g 100 mL/hr over 30 Minutes Intravenous Every 24 hours 11/08/13 1924     11/08/13 1707  ceFAZolin (ANCEF) 2-3 GM-% IVPB SOLR    Comments:  Brien Mates   : cabinet override      11/08/13 1707 11/08/13 1731       Assessment/Plan POD #5 Status post repair incarcerated RIH with biologic mesh secondary to suspected infection, right orchiectomy  Alcohol Withdrawal  ABL anemia - stable  Anticoagulation - INR 2.49 today, pharmacy dosing   1. Follow wound closely  2. Advance diet to regular diet 3. Encourage OOB, ambulation and IS  4. SCD's and coumadin (day 4), with lovenox bridge  5. He's scoring 2-7 on CIWA, improved from yesterday, continue 1 beer TID  6. Back on all home meds 7.  Likely home today, will need daily dressing changes to incision site and coumadin monitoring      LOS: 5 days    DORT, Serrina Minogue 11/13/2013, 7:41 AM Pager: [phone]

## 2013-11-14 ENCOUNTER — Other Ambulatory Visit: Payer: Self-pay | Admitting: *Deleted

## 2013-11-14 MED ORDER — DRONABINOL 2.5 MG PO CAPS: 2.5000 mg | ORAL_CAPSULE | Freq: Two times a day (BID) | ORAL | Status: DC

## 2013-11-14 MED ORDER — OXYCODONE HCL 5 MG/5ML PO SOLN: ORAL | Status: DC

## 2013-11-18 ENCOUNTER — Encounter: Payer: Self-pay | Admitting: Internal Medicine

## 2013-11-18 ENCOUNTER — Non-Acute Institutional Stay (SKILLED_NURSING_FACILITY): Payer: Medicare Other | Admitting: Internal Medicine

## 2013-11-18 DIAGNOSIS — I2510 Atherosclerotic heart disease of native coronary artery without angina pectoris: Secondary | ICD-10-CM

## 2013-11-18 DIAGNOSIS — K4090 Unilateral inguinal hernia, without obstruction or gangrene, not specified as recurrent: Secondary | ICD-10-CM

## 2013-11-18 DIAGNOSIS — I4891 Unspecified atrial fibrillation: Secondary | ICD-10-CM

## 2013-11-18 DIAGNOSIS — J449 Chronic obstructive pulmonary disease, unspecified: Secondary | ICD-10-CM

## 2013-11-18 NOTE — Progress Notes (Signed)
Patient ID: Zachary [name], male   DOB: 1941/07/07, 72 y.o.   MRN: [id number]        HISTORY & PHYSICAL  DATE: 11/18/2013     FACILITY: [facility]  LEVEL OF CARE: SNF (31)  ALLERGIES:  No Known Allergies  CHIEF COMPLAINT:  Manage inguinal hernia, atrial fibrillation, and COPD.    HISTORY OF PRESENT ILLNESS:  The patient is a 72 year-old, male who is hospitalized for a subacute exacerbation of a long-standing right inguinal hernia.  INGUINAL HERNIA: She had a right sided long-standing indwelling and hernia. He started having swelling and increasing pain. He was diagnosed with incarcerated purulent indirect right inguinal hernia with strangulated omentum. He underwent hernia repair with biologic mesh and right orchiectomy. He tolerated the procedure well and readmitted back to the facility. He denies further pain.  ATRIAL FIBRILLATION: the patients atrial fibrillation remains stable.  The patient denies DOE, tachycardia, orthopnea, transient neurological sx, pedal edema, palpitations, & PNDs.  No complications noted from the medications currently being used.    COPD: the COPD remains stable.  Pt denies sob, cough, wheezing or declining exercise tolerance.  No complications from the medications presently being used.    PAST MEDICAL HISTORY :  Past Medical History  Diagnosis Date  . Hypertension   . Coronary artery disease   . Hyperlipemia   . Angina   . Shortness of breath   . Mental disorder   . Arthritis   . COPD (chronic obstructive pulmonary disease)   . Hiatal hernia   . Depression   . Gout   . Joint pain   . Depression   . Anxiety   . Ileus, postoperative 09/29/2013  . PAF (paroxysmal atrial fibrillation) 09/29/2013    On lopressor and Cardizem at home, no anticoagulation.  . S/P colostomy     PAST SURGICAL HISTORY: Past Surgical History  Procedure Laterality Date  . Abdominal surgery    . Laparotomy N/A 09/26/2013    Procedure: EXPLORATORY  LAPAROTOMY;  Surgeon: Liz Malady, MD;  Location: [facility] OR;  Service: General;  Laterality: N/A;  . Inguinal hernia repair Right 11/08/2013    Procedure: inguinal hernia repair with biologic mesh;  Surgeon: Axel Filler, MD;  Location: [facility] OR;  Service: General;  Laterality: Right;  . Orchiectomy Right 11/08/2013    Procedure: ORCHIECTOMY;  Surgeon: Axel Filler, MD;  Location: [facility] OR;  Service: General;  Laterality: Right;    SOCIAL HISTORY:  reports that he has been smoking Cigarettes.  He has a 15 pack-year smoking history. He has quit using smokeless tobacco. He reports that he drinks about 7.2 ounces of alcohol per week. He reports that he does not use illicit drugs.  FAMILY HISTORY: None  CURRENT MEDICATIONS: Reviewed per MAR  REVIEW OF SYSTEMS:  See HPI otherwise 14 point ROS is negative.  PHYSICAL EXAMINATION  VS:  T 98.7       P 72      RR 20      BP 130/74      POX%  85      WT (Lb) 147    GENERAL: no acute distress, normal body habitus EYES: conjunctivae normal, sclerae normal, normal eye lids MOUTH/THROAT: lips without lesions,no lesions in the mouth,tongue is without lesions,uvula elevates in midline NECK: supple, trachea midline, no neck masses, no thyroid tenderness, no thyromegaly LYMPHATICS: no LAN in the neck, no supraclavicular LAN RESPIRATORY: breathing is even & unlabored, BS CTAB CARDIAC: heart  rate is irregular irregular, no murmur,no extra heart sounds, no edema GI:  ABDOMEN: intact transverse dressing and left lower quadrant colostomy, bowel sounds diminished, abdomen mildly tender to palpation      LIVER/SPLEEN: no hepatomegaly, no splenomegaly MUSCULOSKELETAL: HEAD: normal to inspection & palpation BACK: no kyphosis, scoliosis or spinal processes tenderness EXTREMITIES: LEFT UPPER EXTREMITY: full range of motion, normal strength & tone RIGHT UPPER EXTREMITY:  full range of motion, normal strength & tone LEFT LOWER EXTREMITY:  full range of motion,  normal strength & tone RIGHT LOWER EXTREMITY:  Minimal range of motion due to inguinal surgery, normal strength & tone PSYCHIATRIC: the patient is alert & oriented to person, affect & behavior appropriate  LABS/RADIOLOGY:  12-14 total protein 5.7, albumin 2.5 otherwise CMP normal 11-14 BMP normal.    WBC 10.9, hemoglobin 9.5, MCV 85.1, platelets 235.    ASSESSMENT/PLAN:  Right inguinal hernia with incarceration-status post repair.    Atrial fibrillation.  Rate controlled.    COPD.  Well compensated.    CAD.  Stable.    Hypertension.  Well controlled.    Cirrhosis.  Compensated.    GERD.  Well controlled.      I have reviewed patient's medical records received at admission/from hospitalization.  CPT CODE: 09811

## 2013-11-19 DIAGNOSIS — D473 Essential (hemorrhagic) thrombocythemia: Secondary | ICD-10-CM | POA: Insufficient documentation

## 2013-11-19 DIAGNOSIS — D638 Anemia in other chronic diseases classified elsewhere: Secondary | ICD-10-CM | POA: Insufficient documentation

## 2013-11-19 NOTE — Progress Notes (Signed)
Patient ID: Zachary [name], male   DOB: 08-29-1941, 72 y.o.   MRN: [id number]        PROGRESS NOTE  DATE: 10/14/2013    FACILITY:  [facility]  LEVEL OF CARE: SNF (31)  Acute Visit  CHIEF COMPLAINT:  Manage anemia of chronic disease and thrombocytosis.    HISTORY OF PRESENT ILLNESS: I was requested by the staff to assess the patient regarding above problem(s):  ANEMIA: The anemia is unstable. The patient denies fatigue, melena or hematochezia.  The patient is currently not on iron.   On 10/08/2013:  Hemoglobin 8.9, MCV 83.  In 07/2013:  Hemoglobin 11.4.    THROMBOCYTOSIS:  Unstable problem.  On 10/08/2013:  Platelets 465.  In 07/2013:  Platelet count 434.  Patient denies numbness, tingling, or weakness.    PAST MEDICAL HISTORY : Reviewed.  No changes.  CURRENT MEDICATIONS: Reviewed per [facility]  REVIEW OF SYSTEMS:  GENERAL: no change in appetite, no fatigue, no weight changes, no fever, chills or weakness RESPIRATORY: no cough, SOB, DOE,, wheezing, hemoptysis CARDIAC: no chest pain, edema or palpitations GI: no abdominal pain, diarrhea, constipation, heart burn, nausea or vomiting  PHYSICAL EXAMINATION  GENERAL: no acute distress, normal body habitus NECK: supple, trachea midline, no neck masses, no thyroid tenderness, no thyromegaly RESPIRATORY: breathing is even & unlabored, BS CTAB CARDIAC: RRR, no murmur,no extra heart sounds, no edema GI: abdomen soft, diminished BS, no masses, no tenderness, no hepatomegaly, no splenomegaly, patient has a midline abdominal dressing and a colostomy bag   PSYCHIATRIC: the patient is alert & oriented to person, affect & behavior appropriate  ASSESSMENT/PLAN:  Anemia of chronic disease.  Unstable problem.  Hemoglobin declined.  Recheck.    Thrombocytosis.  Unstable problem.  Worsening platelet count.  Likely acute phase reactant.  We will monitor.    CPT CODE: 41324

## 2013-12-02 ENCOUNTER — Encounter: Payer: Self-pay | Admitting: Internal Medicine

## 2013-12-02 DIAGNOSIS — R252 Cramp and spasm: Secondary | ICD-10-CM | POA: Insufficient documentation

## 2013-12-02 NOTE — Progress Notes (Signed)
Patient ID: Zachary [name], male   DOB: 02-28-1941, 72 y.o.   MRN: [id number]        PROGRESS NOTE  DATE: 10/21/2013    FACILITY:  [facility]  LEVEL OF CARE: SNF (31)  Acute Visit  CHIEF COMPLAINT:  Manage lower extremity cramping.    HISTORY OF PRESENT ILLNESS: I was requested by the staff to assess the patient regarding above problem(s):  Patient is complaining of bilateral lower extremity cramping most of the time.  Symptoms are intermittent.  He cannot identify precipitating or alleviating factors.  There is no temporal relationship.  There are no other associated signs and symptoms.    PAST MEDICAL HISTORY : Reviewed.  No changes.  CURRENT MEDICATIONS: Reviewed per [facility]  REVIEW OF SYSTEMS:  GENERAL: no change in appetite, no fatigue, no weight changes, no fever, chills or weakness RESPIRATORY: no cough, SOB, DOE,, wheezing, hemoptysis CARDIAC: no chest pain, edema or palpitations GI: no abdominal pain, diarrhea, constipation, heart burn, nausea or vomiting  PHYSICAL EXAMINATION  VS:  T 97.8       P 68      RR 20      BP 120/60    POX %       WT (Lb)  GENERAL: no acute distress, normal body habitus NECK: supple, trachea midline, no neck masses, no thyroid tenderness, no thyromegaly RESPIRATORY: breathing is even & unlabored, BS CTAB CARDIAC: RRR, no murmur,no extra heart sounds, no edema GI: abdomen soft, normal BS, no masses, no tenderness, no hepatomegaly, no splenomegaly PSYCHIATRIC: the patient is alert & oriented to person, affect & behavior appropriate  LABS/RADIOLOGY: On 10/08/2013:  Potassium 4.    ASSESSMENT/PLAN:  Bilateral lower extremity cramping.  New problem.  Recheck potassium level.  Start Skelaxin 800 mg  t.i.d. p.r.n.    THN Metrics:   Not on aspirin.  Current smoker.    CPT CODE: 78469

## 2013-12-11 ENCOUNTER — Encounter (INDEPENDENT_AMBULATORY_CARE_PROVIDER_SITE_OTHER): Payer: Self-pay | Admitting: General Surgery

## 2013-12-11 ENCOUNTER — Ambulatory Visit (INDEPENDENT_AMBULATORY_CARE_PROVIDER_SITE_OTHER): Payer: Medicare Other | Admitting: General Surgery

## 2013-12-11 ENCOUNTER — Encounter (INDEPENDENT_AMBULATORY_CARE_PROVIDER_SITE_OTHER): Payer: Self-pay

## 2013-12-11 VITALS — BP 130/80 | HR 66 | Temp 97.8°F | Resp 24 | Wt 145.0 lb

## 2013-12-11 DIAGNOSIS — Z9889 Other specified postprocedural states: Secondary | ICD-10-CM

## 2013-12-11 DIAGNOSIS — Z8719 Personal history of other diseases of the digestive system: Secondary | ICD-10-CM

## 2013-12-11 DIAGNOSIS — Z9049 Acquired absence of other specified parts of digestive tract: Secondary | ICD-10-CM
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
## 2013-12-23 ENCOUNTER — Non-Acute Institutional Stay (SKILLED_NURSING_FACILITY): Payer: PRIVATE HEALTH INSURANCE | Admitting: Internal Medicine

## 2013-12-23 DIAGNOSIS — I10 Essential (primary) hypertension: Secondary | ICD-10-CM

## 2013-12-23 DIAGNOSIS — J449 Chronic obstructive pulmonary disease, unspecified: Secondary | ICD-10-CM

## 2013-12-23 DIAGNOSIS — I2510 Atherosclerotic heart disease of native coronary artery without angina pectoris: Secondary | ICD-10-CM

## 2013-12-23 DIAGNOSIS — I4891 Unspecified atrial fibrillation: Secondary | ICD-10-CM

## 2013-12-23 NOTE — Progress Notes (Signed)
         PROGRESS NOTE  DATE: 1 -20-15  FACILITY: Nursing Home Location: [facility]  LEVEL OF CARE: SNF (31)  Routine Visit  CHIEF COMPLAINT:  Manage atrial fibrillation, COPD and hypertension  HISTORY OF PRESENT ILLNESS:  REASSESSMENT OF ONGOING PROBLEM(S):  ATRIAL FIBRILLATION: the patients atrial fibrillation remains stable.  The staff deny DOE, tachycardia, orthopnea, transient neurological sx, pedal edema, palpitations, & PNDs.  No complications noted from the medications currently being used.  COPD: the COPD remains stable.  Staff  deny sob, cough, wheezing or declining exercise tolerance.  No complications from the medications presently being used.  HTN: Pt 's HTN remains stable.  The staff Deny CP, sob, DOE, pedal edema, headaches, dizziness or visual disturbances.  No complications from the medications currently being used.  Last BP : 106/60, 122/68, 134/64  PAST MEDICAL HISTORY : Reviewed.  No changes.  CURRENT MEDICATIONS: Reviewed per [facility]  REVIEW OF SYSTEMS: Unobtainable, patient is a poor historian  PHYSICAL EXAMINATION  VS:  T 97.5     P 78     RR 20      BP 134/64     POX %     WT (Lb) 147  GENERAL: no acute distress, normal body habitus EYES: conjunctivae normal, sclerae normal, normal eye lids NECK: supple, trachea midline, no neck masses, no thyroid tenderness, no thyromegaly LYMPHATICS: no LAN in the neck, no supraclavicular LAN RESPIRATORY: breathing is even & unlabored, BS CTAB CARDIAC: Heart rate is irregularly irregular, no murmur,no extra heart sounds, no edema GI: abdomen soft, normal BS, no masses, no tenderness, no hepatomegaly, no splenomegaly PSYCHIATRIC: the patient is alert & disoriented, affect & behavior appropriate  LABS/RADIOLOGY:  1-15 hemoglobin 8.9, MCV 82 otherwise CBC normal, total protein 5.8, albumin is 2.8 otherwise CMP normal, ferritin 56, TIBC 308, serum iron level 28, percent saturation 9, vitamin B12 level  72, folate 17.1  11-14 potassium 4.4, hemoglobin 9.2, hemoglobin 8.9, MCV 83, platelets 465, WBC 8.3, BMP normal  9-14 albumin 3.3, AST 51 otherwise liver profile normal, vitamin B12 level 606, folate 15  8-14 WBC 9.6, hemoglobin 11.4, MCV 98, platelets 434, creatinine 2.07 otherwise BMP normal  ASSESSMENT/PLAN:  COPD-well compensated Atrial fibrillation-rate controlled hypertension-well-controlled CAD-stable GERD-stable  CPT CODE: 93267

## 2013-12-31 ENCOUNTER — Emergency Department (HOSPITAL_COMMUNITY)
Admission: EM | Admit: 2013-12-31 | Discharge: 2013-12-31 | Disposition: A | Discharge status: Home / Self Care | Payer: PRIVATE HEALTH INSURANCE | Attending: Emergency Medicine | Admitting: Emergency Medicine

## 2013-12-31 ENCOUNTER — Emergency Department (HOSPITAL_COMMUNITY): Payer: PRIVATE HEALTH INSURANCE

## 2013-12-31 ENCOUNTER — Encounter (HOSPITAL_COMMUNITY): Payer: Self-pay | Admitting: Emergency Medicine

## 2013-12-31 DIAGNOSIS — F17200 Nicotine dependence, unspecified, uncomplicated: Secondary | ICD-10-CM | POA: Insufficient documentation

## 2013-12-31 DIAGNOSIS — I2510 Atherosclerotic heart disease of native coronary artery without angina pectoris: Secondary | ICD-10-CM | POA: Insufficient documentation

## 2013-12-31 DIAGNOSIS — Z792 Long term (current) use of antibiotics: Secondary | ICD-10-CM | POA: Insufficient documentation

## 2013-12-31 DIAGNOSIS — I209 Angina pectoris, unspecified: Secondary | ICD-10-CM | POA: Insufficient documentation

## 2013-12-31 DIAGNOSIS — J4489 Other specified chronic obstructive pulmonary disease: Secondary | ICD-10-CM | POA: Insufficient documentation

## 2013-12-31 DIAGNOSIS — J449 Chronic obstructive pulmonary disease, unspecified: Secondary | ICD-10-CM | POA: Insufficient documentation

## 2013-12-31 DIAGNOSIS — I10 Essential (primary) hypertension: Secondary | ICD-10-CM | POA: Insufficient documentation

## 2013-12-31 DIAGNOSIS — Z79899 Other long term (current) drug therapy: Secondary | ICD-10-CM | POA: Insufficient documentation

## 2013-12-31 DIAGNOSIS — Z7901 Long term (current) use of anticoagulants: Secondary | ICD-10-CM | POA: Insufficient documentation

## 2013-12-31 DIAGNOSIS — F329 Major depressive disorder, single episode, unspecified: Secondary | ICD-10-CM | POA: Insufficient documentation

## 2013-12-31 DIAGNOSIS — I4891 Unspecified atrial fibrillation: Secondary | ICD-10-CM | POA: Insufficient documentation

## 2013-12-31 DIAGNOSIS — Z8719 Personal history of other diseases of the digestive system: Secondary | ICD-10-CM | POA: Insufficient documentation

## 2013-12-31 DIAGNOSIS — R51 Headache: Secondary | ICD-10-CM | POA: Insufficient documentation

## 2013-12-31 DIAGNOSIS — J189 Pneumonia, unspecified organism: Secondary | ICD-10-CM

## 2013-12-31 DIAGNOSIS — M129 Arthropathy, unspecified: Secondary | ICD-10-CM | POA: Insufficient documentation

## 2013-12-31 DIAGNOSIS — F3289 Other specified depressive episodes: Secondary | ICD-10-CM | POA: Insufficient documentation

## 2013-12-31 DIAGNOSIS — J159 Unspecified bacterial pneumonia: Secondary | ICD-10-CM | POA: Insufficient documentation

## 2013-12-31 DIAGNOSIS — Z933 Colostomy status: Secondary | ICD-10-CM | POA: Insufficient documentation

## 2013-12-31 DIAGNOSIS — E785 Hyperlipidemia, unspecified: Secondary | ICD-10-CM | POA: Insufficient documentation

## 2013-12-31 LAB — BASIC METABOLIC PANEL
BUN: 13 mg/dL (ref 6–23)
CHLORIDE: 96 meq/L (ref 96–112)
CO2: 27 meq/L (ref 19–32)
Calcium: 9.1 mg/dL (ref 8.4–10.5)
Creatinine, Ser: 0.94 mg/dL (ref 0.50–1.35)
GFR calc Af Amer: >90 mL/min (ref >90)
GFR calc non Af Amer: 82 mL/min — ABNORMAL LOW (ref >90)
Glucose, Bld: 81 mg/dL (ref 70–99)
Potassium: 4.2 mEq/L (ref 3.7–5.3)
Sodium: 133 mEq/L — ABNORMAL LOW (ref 137–147)

## 2013-12-31 LAB — CBC
HCT: 36.8 % — ABNORMAL LOW (ref 39.0–52.0)
HEMOGLOBIN: 11.7 g/dL — AB (ref 13.0–17.0)
MCH: 27.7 pg (ref 26.0–34.0)
MCHC: 31.8 g/dL (ref 30.0–36.0)
MCV: 87.2 fL (ref 78.0–100.0)
PLATELETS: 223 10*3/uL (ref 150–400)
RBC: 4.22 MIL/uL (ref 4.22–5.81)
RDW: 23.1 % — ABNORMAL HIGH (ref 11.5–15.5)
WBC: 11.3 10*3/uL — AB (ref 4.0–10.5)

## 2013-12-31 LAB — POCT I-STAT TROPONIN I: TROPONIN I, POC: 0.02 ng/mL (ref 0.00–0.08)

## 2013-12-31 LAB — PROTIME-INR
INR: 1.3 (ref 0.00–1.49)
Prothrombin Time: 15.9 seconds — ABNORMAL HIGH (ref 11.6–15.2)

## 2013-12-31 LAB — PRO B NATRIURETIC PEPTIDE: PRO B NATRI PEPTIDE: 1552 pg/mL — AB (ref 0–125)

## 2013-12-31 MED ORDER — HYDROCODONE-ACETAMINOPHEN 5-325 MG PO TABS
2.0000 | ORAL_TABLET | Freq: Once | ORAL | Status: AC
  Administered 2013-12-31: 2 via ORAL
  Filled 2013-12-31: qty 2

## 2013-12-31 MED ORDER — LEVOFLOXACIN 750 MG PO TABS: 750.0000 mg | ORAL_TABLET | Freq: Every day | ORAL | Status: DC

## 2013-12-31 MED ORDER — AEROCHAMBER Z-STAT PLUS/MEDIUM MISC
1.0000 | Freq: Once | Status: AC
  Administered 2013-12-31: 1
  Filled 2013-12-31: qty 1

## 2013-12-31 MED ORDER — ALBUTEROL SULFATE HFA 108 (90 BASE) MCG/ACT IN AERS
2.0000 | INHALATION_SPRAY | RESPIRATORY_TRACT | Status: DC | PRN
  Administered 2013-12-31: 2 via RESPIRATORY_TRACT
  Filled 2013-12-31: qty 6.7

## 2013-12-31 MED ORDER — LEVOFLOXACIN 750 MG PO TABS
750.0000 mg | ORAL_TABLET | Freq: Once | ORAL | Status: AC
  Administered 2013-12-31: 750 mg via ORAL
  Filled 2013-12-31: qty 1

## 2013-12-31 MED ORDER — ALBUTEROL SULFATE (2.5 MG/3ML) 0.083% IN NEBU: 2.5000 mg | INHALATION_SOLUTION | RESPIRATORY_TRACT | Status: DC | PRN

## 2013-12-31 NOTE — ED Notes (Signed)
[facility] contacted for transport 

## 2013-12-31 NOTE — ED Notes (Signed)
Pt given 4 nitros little relief, 324 ASA by ems

## 2013-12-31 NOTE — ED Notes (Signed)
Per ems, [place], pt has been trying to quit smoking, started nicotine patch 3 days ago. Yesterday started having headache, still there. This mroning started having chest pain, subsided with rest, but it came back this afternoon. Complaining of generalized weakness all over. Pt states he had an episode a couple years ago, was seen for it by PCP, couldn't find anything, pt c/o CP left side, no N/V/D, radiating to left arm. On o2 prn. Sats low 80's upon arrival, pt now on 4L [place] with 94% on montior. Pt has had cough for 3 months.

## 2013-12-31 NOTE — Discharge Instructions (Signed)
Use the inhaler, 2 puffs every 3 hours for breathing, or coughing. Continue to avoid tobacco products. Start the antibiotic prescription, tomorrow. Return here, if needed, for problems.    Pneumonia, Adult Pneumonia is an infection of the lungs. It may be caused by a germ (virus or bacteria). Some types of pneumonia can spread easily from person to person. This can happen when you cough or sneeze. HOME CARE  Only take medicine as told by your doctor.  Take your medicine (antibiotics) as told. Finish it even if you start to feel better.  Do not smoke.  You may use a vaporizer or humidifier in your room. This can help loosen thick spit (mucus).  Sleep so you are almost sitting up (semi-upright). This helps reduce coughing.  Rest. A shot (vaccine) can help prevent pneumonia. Shots are often advised for:  People over 69 years old.  Patients on chemotherapy.  People with long-term (chronic) lung problems.  People with immune system problems. GET HELP RIGHT AWAY IF:   You are getting worse.  You cannot control your cough, and you are losing sleep.  You cough up blood.  Your pain gets worse, even with medicine.  You have a fever.  Any of your problems are getting worse, not better.  You have shortness of breath or chest pain. MAKE SURE YOU:   Understand these instructions.  Will watch your condition.  Will get help right away if you are not doing well or get worse. Document Released: 05/08/2008 Document Revised: 02/12/2012 Document Reviewed: 02/10/2011 [facility] Patient Information 2014 [place].

## 2013-12-31 NOTE — ED Notes (Signed)
Pt speaking with his sister on the telephone at this time in NAD

## 2013-12-31 NOTE — ED Provider Notes (Signed)
CSN: [id number]     Arrival date & time 12/31/13  1703 History   First MD Initiated Contact with Patient 12/31/13 1826     Chief Complaint  Patient presents with  . Headache  . Chest Pain   (Consider location/radiation/quality/duration/timing/severity/associated sxs/prior Treatment) Patient is a 73 y.o. male presenting with headaches and chest pain. The history is provided by the patient.  Headache Chest Pain Associated symptoms: headache    He is here for evaluation of chest pain, cough, weakness, and hypoxia, present for 2 days. He began using a nicotine patch, 3 days ago. He removes the patch today, because it gave him headaches and caused the chest pain, by his report. He is trying to stop smoking cigarettes. Cardiac stress test last year that was normal. The chest pain was yesterday and lasted just a few minutes. He did not take anything to make it better. He does not have shortness of breath at rest. He uses oxygen when necessary. His he is more than usual. His only current medicine for COPD, is Symbicort inhaler. He has not had a recent pneumonia, bronchitis, fever, or chills, weakness, or dizziness. He's taking his medications, as prescribed. There are no other known modifying factors.  Past Medical History  Diagnosis Date  . Hypertension   . Coronary artery disease   . Hyperlipemia   . Angina   . Shortness of breath   . Mental disorder   . Arthritis   . COPD (chronic obstructive pulmonary disease)   . Hiatal hernia   . Depression   . Gout   . Joint pain   . Depression   . Anxiety   . Ileus, postoperative 09/29/2013  . PAF (paroxysmal atrial fibrillation) 09/29/2013    On lopressor and Cardizem at home, no anticoagulation.  . S/P colostomy    Past Surgical History  Procedure Laterality Date  . Abdominal surgery    . Laparotomy N/A 09/26/2013    Procedure: EXPLORATORY LAPAROTOMY;  Surgeon: Zenovia Jarred, MD;  Location: [place];  Service: General;  Laterality: N/A;  .  Inguinal hernia repair Right 11/08/2013    Procedure: inguinal hernia repair with biologic mesh;  Surgeon: Ralene Ok, MD;  Location: [place];  Service: General;  Laterality: Right;  . Orchiectomy Right 11/08/2013    Procedure: ORCHIECTOMY;  Surgeon: Ralene Ok, MD;  Location: [place];  Service: General;  Laterality: Right;   No family history on file. History  Substance Use Topics  . Smoking status: Current Every Day Smoker -- 1.00 packs/day for 15 years    Types: Cigarettes  . Smokeless tobacco: Former Systems developer  . Alcohol Use: 7.2 oz/week    12 Cans of beer per week     Comment: pt heavy drinker, pt stets at lest 2-3 40oz per day    Review of Systems  Cardiovascular: Positive for chest pain.  Neurological: Positive for headaches.  All other systems reviewed and are negative.    Allergies  Review of patient's allergies indicates no known allergies.  Home Medications   Current Outpatient Rx  Name  Route  Sig  Dispense  Refill  . acetaminophen (TYLENOL) 325 MG tablet   Oral   Take 650 mg by mouth every 6 (six) hours as needed.         Marland Kitchen albuterol (PROVENTIL HFA;VENTOLIN HFA) 108 (90 BASE) MCG/ACT inhaler   Inhalation   Inhale 2 puffs into the lungs every 6 (six) hours as needed for wheezing.         Marland Kitchen  Amino Acids-Protein Hydrolys (FEEDING SUPPLEMENT, PRO-STAT SUGAR FREE 64,) LIQD   Oral   Take 30 mLs by mouth 2 (two) times daily.         Marland Kitchen diltiazem (DILACOR XR) 120 MG 24 hr capsule   Oral   Take 1 capsule (120 mg total) by mouth daily.         Marland Kitchen dronabinol (MARINOL) 2.5 MG capsule   Oral   Take 1 capsule (2.5 mg total) by mouth 2 (two) times daily before lunch and supper.   60 capsule   5   . gabapentin (NEURONTIN) 100 MG capsule   Oral   Take 100 mg by mouth 3 (three) times daily.         Marland Kitchen lamoTRIgine (LAMICTAL) 25 MG tablet   Oral   Take 50 mg by mouth at bedtime.         . metoprolol succinate (TOPROL-XL) 25 MG 24 hr tablet   Oral   Take 1  tablet (25 mg total) by mouth every evening.         . pantoprazole (PROTONIX) 40 MG tablet   Oral   Take 40 mg by mouth daily.         . traMADol (ULTRAM) 50 MG tablet   Oral   Take 1-2 tablets (50-100 mg total) by mouth every 6 (six) hours as needed for moderate pain or severe pain.   40 tablet   0   . traZODone (DESYREL) 50 MG tablet   Oral   Take 50-100 mg by mouth every 6 (six) hours as needed for sleep (for pain). *takes an extra 50mg  if need help sleeping         . Vitamin D, Ergocalciferol, (DRISDOL) 50000 UNITS CAPS capsule   Oral   Take 50,000 Units by mouth every 30 (thirty) days.         Marland Kitchen warfarin (COUMADIN) 4 MG tablet   Oral   Take 4 mg by mouth daily at 6 PM.         . levofloxacin (LEVAQUIN) 750 MG tablet   Oral   Take 1 tablet (750 mg total) by mouth daily. X 7 days   7 tablet   0   . oxyCODONE (ROXICODONE) 5 MG/5ML solution      Administer 5-10 ml (5-10 mg) by mouth every 4 hours as needed   50 mL   0    BP 154/71  Pulse 60  Temp(Src) 98.7 F (37.1 C) (Oral)  Resp 19  SpO2 92% Physical Exam  Nursing note and vitals reviewed. Constitutional: He is oriented to person, place, and time. He appears well-developed.  Elderly, vigorous  HENT:  Head: Normocephalic and atraumatic.  Right Ear: External ear normal.  Left Ear: External ear normal.  Eyes: Conjunctivae and EOM are normal. Pupils are equal, round, and reactive to light.  Neck: Normal range of motion and phonation normal. Neck supple.  Cardiovascular: Normal rate, regular rhythm, normal heart sounds and intact distal pulses.   Pulmonary/Chest: Effort normal and breath sounds normal. No respiratory distress. He exhibits no bony tenderness.  Prolonged expirations, and poor air movement, bilaterally. Scattered, expiratory wheezes. No audible rhonchi, or rales.  Abdominal: Soft. Normal appearance. He exhibits no distension. There is no tenderness.  Musculoskeletal: Normal range of  motion. He exhibits no edema and no tenderness.  Neurological: He is alert and oriented to person, place, and time. No cranial nerve deficit or sensory deficit. He exhibits normal muscle tone.  Coordination normal.  Skin: Skin is warm, dry and intact.  Psychiatric: He has a normal mood and affect. His behavior is normal. Judgment and thought content normal.    ED Course  Procedures (including critical care time) Medications  albuterol (PROVENTIL) (2.5 MG/3ML) 0.083% nebulizer solution 2.5 mg (not administered)  levofloxacin (LEVAQUIN) tablet 750 mg (750 mg Oral Given 12/31/13 1926)  aerochamber Z-Stat Plus/medium 1 each (1 each Other Given 12/31/13 2009)  HYDROcodone-acetaminophen (NORCO/VICODIN) 5-325 MG per tablet 2 tablet (2 tablets Oral Given 12/31/13 1927)    Patient Vitals for the past 24 hrs:  BP Temp Temp src Pulse Resp SpO2  12/31/13 2230 154/71 mmHg - - 60 19 92 %  12/31/13 2227 147/72 mmHg - - 69 27 90 %  12/31/13 2215 147/72 mmHg - - 61 21 92 %  12/31/13 2200 151/72 mmHg - - 61 29 90 %  12/31/13 2145 125/71 mmHg - - 82 24 99 %  12/31/13 2130 156/77 mmHg - - 66 25 93 %  12/31/13 1854 146/77 mmHg 98.7 F (37.1 C) Oral 69 33 98 %  12/31/13 1845 146/77 mmHg - - 63 24 97 %  12/31/13 1815 151/71 mmHg - - 65 23 98 %  12/31/13 1730 119/55 mmHg - - 62 29 95 %  12/31/13 1707 - - - - - 96 %  12/31/13 1704 133/72 mmHg 100.1 F (37.8 C) Oral 65 26 94 %      Labs Review Labs Reviewed  CBC - Abnormal; Notable for the following:    WBC 11.3 (*)    Hemoglobin 11.7 (*)    HCT 36.8 (*)    RDW 23.1 (*)    All other components within normal limits  BASIC METABOLIC PANEL - Abnormal; Notable for the following:    Sodium 133 (*)    GFR calc non Af Amer 82 (*)    All other components within normal limits  PRO B NATRIURETIC PEPTIDE - Abnormal; Notable for the following:    Pro B Natriuretic peptide (BNP) 1552.0 (*)    All other components within normal limits  PROTIME-INR - Abnormal;  Notable for the following:    Prothrombin Time 15.9 (*)    All other components within normal limits  POCT I-STAT TROPONIN I   Imaging Review Dg Chest 2 View  12/31/2013   CLINICAL DATA:  Chest pain, shortness of breath, COPD  EXAM: CHEST  2 VIEW  COMPARISON:  09/26/2013  FINDINGS: Chronic interstitial markings/emphysematous changes. Mild left lower lobe opacity, atelectasis versus pneumonia. No pleural effusion or pneumothorax.  The heart is normal in size.  Degenerative changes of the visualized thoracolumbar spine.  IMPRESSION: Left lower lobe opacity, atelectasis versus pneumonia.  Underlying chronic interstitial markings/emphysematous changes.   Electronically Signed   By: Julian Hy M.D.   On: 12/31/2013 18:15    EKG Interpretation    Date/Time:  Wednesday December 31 2013 17:04:21 EST Ventricular Rate:  69 PR Interval:  160 QRS Duration: 97 QT Interval:  422 QTC Calculation: 452 R Axis:   31 Text Interpretation:  Sinus rhythm since last tracing no significant change Confirmed by Preslee Regas  MD, Fran Mcree ([id number]) on 12/31/2013 5:35:34 PM            MDM   1. Community acquired pneumonia    Nonspecific chest pain with headache. Symptoms likely related to recent use of nicotine patch. He has an incidental pneumonia without hemodynamic instability, or worrisome findings on history or exam.\  Nursing  Notes Reviewed/ Care Coordinated Applicable Imaging Reviewed Interpretation of Laboratory Data incorporated into ED treatment  The patient appears reasonably screened and/or stabilized for discharge and I doubt any other medical condition or other [facility] requiring further screening, evaluation, or treatment in the ED at this time prior to discharge.  Plan: Home Medications- Levaquin and Albuterol; Home Treatments- Avoid tobacco; return here if the recommended treatment, does not improve the symptoms; Recommended follow up- PCP 1 week    Richarda Blade, MD 01/01/14 [phone]

## 2013-12-31 NOTE — ED Notes (Signed)
Pt comfortable with d/c and f/u instructions. Report given to [facility] and to [facility]. Prescriptions x1.

## 2014-01-19 ENCOUNTER — Other Ambulatory Visit: Payer: Self-pay | Admitting: *Deleted

## 2014-01-19 ENCOUNTER — Encounter: Payer: Self-pay | Admitting: *Deleted

## 2014-01-19 MED ORDER — TRAMADOL HCL 50 MG PO TABS: ORAL_TABLET | ORAL | Status: DC

## 2014-01-19 NOTE — Telephone Encounter (Signed)
[facility] 

## 2014-01-20 ENCOUNTER — Other Ambulatory Visit: Payer: Self-pay | Admitting: *Deleted

## 2014-01-20 MED ORDER — OXYCODONE HCL 5 MG PO TABS: ORAL_TABLET | ORAL | Status: DC

## 2014-01-20 NOTE — Telephone Encounter (Signed)
[facility] 

## 2014-01-22 ENCOUNTER — Encounter (HOSPITAL_COMMUNITY): Payer: Self-pay | Admitting: Emergency Medicine

## 2014-01-22 ENCOUNTER — Emergency Department (HOSPITAL_COMMUNITY)
Admission: EM | Admit: 2014-01-22 | Discharge: 2014-01-23 | Disposition: A | Discharge status: Skilled Nursing Facility | Payer: PRIVATE HEALTH INSURANCE | Attending: Emergency Medicine | Admitting: Emergency Medicine

## 2014-01-22 ENCOUNTER — Emergency Department (HOSPITAL_COMMUNITY): Payer: PRIVATE HEALTH INSURANCE

## 2014-01-22 DIAGNOSIS — I209 Angina pectoris, unspecified: Secondary | ICD-10-CM | POA: Insufficient documentation

## 2014-01-22 DIAGNOSIS — K5900 Constipation, unspecified: Secondary | ICD-10-CM | POA: Insufficient documentation

## 2014-01-22 DIAGNOSIS — I10 Essential (primary) hypertension: Secondary | ICD-10-CM | POA: Insufficient documentation

## 2014-01-22 DIAGNOSIS — F3289 Other specified depressive episodes: Secondary | ICD-10-CM | POA: Insufficient documentation

## 2014-01-22 DIAGNOSIS — Z933 Colostomy status: Secondary | ICD-10-CM | POA: Insufficient documentation

## 2014-01-22 DIAGNOSIS — M129 Arthropathy, unspecified: Secondary | ICD-10-CM | POA: Insufficient documentation

## 2014-01-22 DIAGNOSIS — F17200 Nicotine dependence, unspecified, uncomplicated: Secondary | ICD-10-CM | POA: Insufficient documentation

## 2014-01-22 DIAGNOSIS — IMO0002 Reserved for concepts with insufficient information to code with codable children: Secondary | ICD-10-CM | POA: Insufficient documentation

## 2014-01-22 DIAGNOSIS — Z7901 Long term (current) use of anticoagulants: Secondary | ICD-10-CM | POA: Insufficient documentation

## 2014-01-22 DIAGNOSIS — J449 Chronic obstructive pulmonary disease, unspecified: Secondary | ICD-10-CM | POA: Insufficient documentation

## 2014-01-22 DIAGNOSIS — Z79899 Other long term (current) drug therapy: Secondary | ICD-10-CM | POA: Insufficient documentation

## 2014-01-22 DIAGNOSIS — J4489 Other specified chronic obstructive pulmonary disease: Secondary | ICD-10-CM | POA: Insufficient documentation

## 2014-01-22 DIAGNOSIS — I4891 Unspecified atrial fibrillation: Secondary | ICD-10-CM | POA: Insufficient documentation

## 2014-01-22 DIAGNOSIS — F329 Major depressive disorder, single episode, unspecified: Secondary | ICD-10-CM | POA: Insufficient documentation

## 2014-01-22 DIAGNOSIS — F411 Generalized anxiety disorder: Secondary | ICD-10-CM | POA: Insufficient documentation

## 2014-01-22 DIAGNOSIS — R109 Unspecified abdominal pain: Secondary | ICD-10-CM

## 2014-01-22 DIAGNOSIS — F489 Nonpsychotic mental disorder, unspecified: Secondary | ICD-10-CM | POA: Insufficient documentation

## 2014-01-22 DIAGNOSIS — I2510 Atherosclerotic heart disease of native coronary artery without angina pectoris: Secondary | ICD-10-CM | POA: Insufficient documentation

## 2014-01-22 DIAGNOSIS — M109 Gout, unspecified: Secondary | ICD-10-CM | POA: Insufficient documentation

## 2014-01-22 DIAGNOSIS — Z9889 Other specified postprocedural states: Secondary | ICD-10-CM | POA: Insufficient documentation

## 2014-01-22 DIAGNOSIS — E785 Hyperlipidemia, unspecified: Secondary | ICD-10-CM | POA: Insufficient documentation

## 2014-01-22 MED ORDER — TRAMADOL HCL 50 MG PO TABS
50.0000 mg | ORAL_TABLET | Freq: Once | ORAL | Status: AC
  Administered 2014-01-22: 50 mg via ORAL
  Filled 2014-01-22: qty 1

## 2014-01-22 NOTE — ED Provider Notes (Signed)
CSN: [id number]     Arrival date & time 01/22/14  2009 History   First MD Initiated Contact with Patient 01/22/14 2112     Chief Complaint  Patient presents with  . Abdominal Pain  . Constipation     Patient is a 73 y.o. male presenting with abdominal pain and constipation. The history is provided by the patient.  Abdominal Pain Pain location:  LLQ Pain quality: aching   Pain severity:  Mild Onset quality:  Gradual Duration:  6 days Timing:  Intermittent Progression:  Worsening Chronicity:  Recurrent Relieved by:  Nothing Worsened by:  Palpation Associated symptoms: constipation   Associated symptoms: no fever and no vomiting   Constipation Associated symptoms: abdominal pain   Associated symptoms: no fever and no vomiting   pt reports for past 6 days he has had pain around his colostomy site in his LLQ with associated hard stools.  No bloody stools.  No vomiting.  No weakness.    Past Medical History  Diagnosis Date  . Hypertension   . Coronary artery disease   . Hyperlipemia   . Angina   . Shortness of breath   . Mental disorder   . Arthritis   . COPD (chronic obstructive pulmonary disease)   . Hiatal hernia   . Depression   . Gout   . Joint pain   . Depression   . Anxiety   . Ileus, postoperative 09/29/2013  . PAF (paroxysmal atrial fibrillation) 09/29/2013    On lopressor and Cardizem at home, no anticoagulation.  . S/P colostomy    Past Surgical History  Procedure Laterality Date  . Abdominal surgery    . Laparotomy N/A 09/26/2013    Procedure: EXPLORATORY LAPAROTOMY;  Surgeon: Zenovia Jarred, MD;  Location: [place];  Service: General;  Laterality: N/A;  . Inguinal hernia repair Right 11/08/2013    Procedure: inguinal hernia repair with biologic mesh;  Surgeon: Ralene Ok, MD;  Location: [place];  Service: General;  Laterality: Right;  . Orchiectomy Right 11/08/2013    Procedure: ORCHIECTOMY;  Surgeon: Ralene Ok, MD;  Location: [place];  Service:  General;  Laterality: Right;   History reviewed. No pertinent family history. History  Substance Use Topics  . Smoking status: Current Every Day Smoker -- 1.00 packs/day for 15 years    Types: Cigarettes  . Smokeless tobacco: Former Systems developer  . Alcohol Use: 7.2 oz/week    12 Cans of beer per week     Comment: Pt reports has not had a drink in 3 months, states used to be heavy drinker    Review of Systems  Constitutional: Negative for fever.  Gastrointestinal: Positive for abdominal pain and constipation. Negative for vomiting.  All other systems reviewed and are negative.      Allergies  Review of patient's allergies indicates no known allergies.  Home Medications   Current Outpatient Rx  Name  Route  Sig  Dispense  Refill  . acetaminophen (TYLENOL) 325 MG tablet   Oral   Take 650 mg by mouth every 6 (six) hours as needed.         Marland Kitchen albuterol (PROVENTIL HFA;VENTOLIN HFA) 108 (90 BASE) MCG/ACT inhaler   Inhalation   Inhale 2 puffs into the lungs every 6 (six) hours as needed for wheezing.         . Amino Acids-Protein Hydrolys (FEEDING SUPPLEMENT, PRO-STAT SUGAR FREE 64,) LIQD   Oral   Take 30 mLs by mouth 2 (two) times daily.         Marland Kitchen  Colchicine 0.6 MG CAPS   Oral   Take 0.6 mg by mouth every other day.          . diltiazem (CARDIZEM CD) 120 MG 24 hr capsule   Oral   Take 120 mg by mouth every morning.          . docusate sodium (COLACE) 100 MG capsule   Oral   Take 100 mg by mouth daily.         Marland Kitchen dronabinol (MARINOL) 2.5 MG capsule   Oral   Take 1 capsule (2.5 mg total) by mouth 2 (two) times daily before lunch and supper.   60 capsule   5   . ferrous fumarate (HEMOCYTE - 106 MG FE) 325 (106 FE) MG TABS tablet   Oral   Take 1 tablet by mouth 2 (two) times daily.         . Fluticasone-Salmeterol (ADVAIR) 250-50 MCG/DOSE AEPB   Inhalation   Inhale 1 puff into the lungs every 12 (twelve) hours.         . gabapentin (NEURONTIN) 100 MG  capsule   Oral   Take 100 mg by mouth 3 (three) times daily.         Marland Kitchen ipratropium-albuterol (DUONEB) 0.5-2.5 (3) MG/3ML SOLN   Nebulization   Take 3 mLs by nebulization every 6 (six) hours as needed (wheezing, shortness of breath).         . lactose free nutrition (BOOST PLUS) LIQD   Oral   Take 237 mLs by mouth 3 (three) times daily with meals.         . lamoTRIgine (LAMICTAL) 25 MG tablet   Oral   Take 50 mg by mouth at bedtime.         . metoprolol succinate (TOPROL-XL) 25 MG 24 hr tablet   Oral   Take 1 tablet (25 mg total) by mouth every evening.         . nicotine (NICODERM CQ - DOSED IN MG/24 HOURS) 14 mg/24hr patch   Transdermal   Place 14 mg onto the skin daily.         Marland Kitchen oxyCODONE (ROXICODONE) 5 MG immediate release tablet      Take one tablet by mouth every 6 hours as needed for moderate to severe pain   120 tablet   0   . oxyCODONE (ROXICODONE) 5 MG/5ML solution   Oral   Take 10 mg by mouth every 4 (four) hours as needed for moderate pain or severe pain.         . pantoprazole (PROTONIX) 40 MG tablet   Oral   Take 40 mg by mouth daily.         . Probiotic Product (PROBIOTIC DAILY PO)   Oral   Take 1 tablet by mouth 2 (two) times daily.         . traMADol (ULTRAM) 50 MG tablet      Take one tablet by mouth every 6 hours as needed for pain   120 tablet   5   . traZODone (DESYREL) 100 MG tablet   Oral   Take 100 mg by mouth at bedtime.         . traZODone (DESYREL) 100 MG tablet   Oral   Take 100 mg by mouth at bedtime.         . traZODone (DESYREL) 50 MG tablet   Oral   Take 50 mg by mouth every 6 (six) hours as needed  for sleep (anxiety).         . Vitamin D, Ergocalciferol, (DRISDOL) 50000 UNITS CAPS capsule   Oral   Take 50,000 Units by mouth every 30 (thirty) days.         Tomi Bamberger SODIUM PO   Oral   Take 5.5 mg by mouth daily.          BP 128/66  Pulse 69  Temp(Src) 98.7 F (37.1 C) (Oral)  Resp 20   Ht 5' 6.5" (1.689 m)  SpO2 93% Physical Exam CONSTITUTIONAL: Well developed/well nourished HEAD: Normocephalic/atraumatic EYES: EOMI/PERRL ENMT: Mucous membranes moist NECK: supple no meningeal signs CV: S1/S2 noted, no murmurs/rubs/gallops noted LUNGS: Lungs are clear to auscultation bilaterally, no apparent distress ABDOMEN: soft, nontender, no rebound or guarding Colostomy noted to LLQ.  Hard stool noted at stoma.  No bloody or dark stool.  +bs noted.  No focal tenderness noted NEURO: Pt is awake/alert, moves all extremitiesx4 EXTREMITIES: pulses normal, full ROM SKIN: warm, color normal PSYCH: no abnormalities of mood noted  ED Course  Procedures .Imaging Review Dg Abd Acute W/chest  01/22/2014   CLINICAL DATA:  Abdominal pain, constipation.  EXAM: ACUTE ABDOMEN SERIES (ABDOMEN 2 VIEW & CHEST 1 VIEW)  COMPARISON:  DG CHEST 2 VIEW dated 12/31/2013; CT ABD/PELVIS W CM dated 09/25/2013  FINDINGS: There is hyperinflation of the lungs compatible with COPD. Heart is normal size. No confluent airspace opacities or effusions.  Nonobstructive bowel gas pattern. Moderate stool burden. No free air organomegaly. Extensive vascular calcifications. Degenerative changes in the lumbar spine. No acute bony abnormality.  IMPRESSION: No acute cardiopulmonary disease.  COPD.  No bowel obstruction or free air.  Moderate stool burden.   Electronically Signed   By: Rolm Baptise M.D.   On: 01/22/2014 22:32   Pt well appearing, no distress, abdomen is soft.  On re-evaluation, his stoma is pink and his stool appears softer/nonbloody, no signs of bowel obstruction and xray is reassuring.  I feel he is safe/stable for d/c back to nursing facility   MDM   Final diagnoses:  Abdominal pain  Constipation    Nursing notes including past medical history and social history reviewed and considered in documentation xrays reviewed and considered     Sharyon Cable, MD 01/22/14 2325

## 2014-01-22 NOTE — ED Notes (Signed)
Pt presents from [facility] via [facility] with c/o abdominal pain and constipation. Pt has ostomy in LLQ and reports that his stool has been coming out in "hard clumps" x6 days. Pt states that [facility] has given MOM x3 without relief. Pt was also given tramadol PTA for LLQ pain. Pt is A&Ox4.

## 2014-01-22 NOTE — ED Notes (Signed)
Pt transported to Xray. 

## 2014-01-23 NOTE — ED Notes (Signed)
Patient transfer  To c 30  Nurse states report done pt awaits transport from [facility], report received from [place]

## 2014-01-23 NOTE — ED Notes (Signed)
Report called to [place] 

## 2014-01-26 ENCOUNTER — Encounter (HOSPITAL_COMMUNITY): Payer: Self-pay | Admitting: Emergency Medicine

## 2014-01-26 ENCOUNTER — Emergency Department (HOSPITAL_COMMUNITY)
Admission: EM | Admit: 2014-01-26 | Discharge: 2014-01-26 | Disposition: A | Discharge status: Home / Self Care | Payer: Medicare Other | Attending: Emergency Medicine | Admitting: Emergency Medicine

## 2014-01-26 DIAGNOSIS — Z7901 Long term (current) use of anticoagulants: Secondary | ICD-10-CM | POA: Insufficient documentation

## 2014-01-26 DIAGNOSIS — J449 Chronic obstructive pulmonary disease, unspecified: Secondary | ICD-10-CM | POA: Insufficient documentation

## 2014-01-26 DIAGNOSIS — Z8719 Personal history of other diseases of the digestive system: Secondary | ICD-10-CM | POA: Insufficient documentation

## 2014-01-26 DIAGNOSIS — I2510 Atherosclerotic heart disease of native coronary artery without angina pectoris: Secondary | ICD-10-CM | POA: Insufficient documentation

## 2014-01-26 DIAGNOSIS — J4489 Other specified chronic obstructive pulmonary disease: Secondary | ICD-10-CM | POA: Insufficient documentation

## 2014-01-26 DIAGNOSIS — I209 Angina pectoris, unspecified: Secondary | ICD-10-CM | POA: Insufficient documentation

## 2014-01-26 DIAGNOSIS — B370 Candidal stomatitis: Secondary | ICD-10-CM | POA: Insufficient documentation

## 2014-01-26 DIAGNOSIS — H6122 Impacted cerumen, left ear: Secondary | ICD-10-CM

## 2014-01-26 DIAGNOSIS — F3289 Other specified depressive episodes: Secondary | ICD-10-CM | POA: Insufficient documentation

## 2014-01-26 DIAGNOSIS — Z8739 Personal history of other diseases of the musculoskeletal system and connective tissue: Secondary | ICD-10-CM | POA: Insufficient documentation

## 2014-01-26 DIAGNOSIS — M109 Gout, unspecified: Secondary | ICD-10-CM | POA: Insufficient documentation

## 2014-01-26 DIAGNOSIS — Z79899 Other long term (current) drug therapy: Secondary | ICD-10-CM | POA: Insufficient documentation

## 2014-01-26 DIAGNOSIS — F17200 Nicotine dependence, unspecified, uncomplicated: Secondary | ICD-10-CM | POA: Insufficient documentation

## 2014-01-26 DIAGNOSIS — I10 Essential (primary) hypertension: Secondary | ICD-10-CM | POA: Insufficient documentation

## 2014-01-26 DIAGNOSIS — F329 Major depressive disorder, single episode, unspecified: Secondary | ICD-10-CM | POA: Insufficient documentation

## 2014-01-26 DIAGNOSIS — F411 Generalized anxiety disorder: Secondary | ICD-10-CM | POA: Insufficient documentation

## 2014-01-26 DIAGNOSIS — H6120 Impacted cerumen, unspecified ear: Secondary | ICD-10-CM | POA: Insufficient documentation

## 2014-01-26 DIAGNOSIS — IMO0002 Reserved for concepts with insufficient information to code with codable children: Secondary | ICD-10-CM | POA: Insufficient documentation

## 2014-01-26 DIAGNOSIS — I4891 Unspecified atrial fibrillation: Secondary | ICD-10-CM | POA: Insufficient documentation

## 2014-01-26 MED ORDER — CARBAMIDE PEROXIDE 6.5 % OT SOLN: 5.0000 [drp] | Freq: Two times a day (BID) | OTIC | Status: DC

## 2014-01-26 MED ORDER — NYSTATIN 100000 UNIT/ML MT SUSP: 500000.0000 [IU] | Freq: Four times a day (QID) | OROMUCOSAL | Status: DC

## 2014-01-26 MED ORDER — DOCUSATE SODIUM 50 MG/5ML PO LIQD
100.0000 mg | Freq: Once | ORAL | Status: AC
  Administered 2014-01-26: 100 mg via OTIC
  Filled 2014-01-26: qty 10

## 2014-01-26 MED ORDER — ACETAMINOPHEN 325 MG PO TABS
650.0000 mg | ORAL_TABLET | Freq: Once | ORAL | Status: AC
  Administered 2014-01-26: 650 mg via ORAL
  Filled 2014-01-26: qty 2

## 2014-01-26 NOTE — ED Notes (Signed)
Notified [facility] for transportation back to [facility]

## 2014-01-26 NOTE — ED Provider Notes (Signed)
Medical screening examination/treatment/procedure(s) were performed by non-physician practitioner and as supervising physician I was immediately available for consultation/collaboration.    Paxton Binns R Pacey Altizer, MD 01/26/14 2355 

## 2014-01-26 NOTE — Discharge Instructions (Signed)
Please use Debrox as prescribed to help soften and loosen hardened ear wax and help with ear pain.  Be sure to follow up later this week with primary care provider for recheck of left ear.   Cerumen Impaction A cerumen impaction is when the wax in your ear forms a plug. This plug usually causes reduced hearing. Sometimes it also causes an earache or dizziness. Removing a cerumen impaction can be difficult and painful. The wax sticks to the ear canal. The canal is sensitive and bleeds easily. If you try to remove a heavy wax buildup with a cotton tipped swab, you may push it in further. Irrigation with water, suction, and small ear curettes may be used to clear out the wax. If the impaction is fixed to the skin in the ear canal, ear drops may be needed for a few days to loosen the wax. People who build up a lot of wax frequently can use ear wax removal products available in your local drugstore. SEEK MEDICAL CARE IF:  You develop an earache, increased hearing loss, or marked dizziness. Document Released: 12/28/2004 Document Revised: 02/12/2012 Document Reviewed: 02/17/2010 [facility] Patient Information 2014 [place], [place].

## 2014-01-26 NOTE — ED Notes (Signed)
PT lives at [facility] . Pt reports LT ear pain for 2 days.

## 2014-01-26 NOTE — ED Notes (Signed)
REport called to [facility].

## 2014-01-26 NOTE — ED Provider Notes (Signed)
CSN: [id number]     Arrival date & time 01/26/14  1717 History  This chart was scribed for Zachary Fordyce, PA-C, non-physician practitioner working with Kathalene Frames, MD by Vernell Barrier, ED scribe. This patient was seen in room TR08C/TR08C and the patient's care was started at 5:27 PM.  Chief Complaint  Patient presents with  . Otalgia   The history is provided by the patient. No language interpreter was used.   HPI Comments: Zachary [name] is a 73 y.o. male who presents to the Emergency Department via EMS from [facility] complaining of throbbing, left ear pain, onset 3 days ago, gradually worsening. Rates pain 10/10. States he feels pain in his left tonsil when he swallows. Pt says he was seen 1.5 years ago with the same ear pain, he states was an infection. Reports he was prescribed ear drops that he used daily to treat that infection. Pt has COPD. Denies fever, cough, congestion, or sore throat.  Past Medical History  Diagnosis Date  . Hypertension   . Coronary artery disease   . Hyperlipemia   . Angina   . Shortness of breath   . Mental disorder   . Arthritis   . COPD (chronic obstructive pulmonary disease)   . Hiatal hernia   . Depression   . Gout   . Joint pain   . Depression   . Anxiety   . Ileus, postoperative 09/29/2013  . PAF (paroxysmal atrial fibrillation) 09/29/2013    On lopressor and Cardizem at home, no anticoagulation.  . S/P colostomy    Past Surgical History  Procedure Laterality Date  . Abdominal surgery    . Laparotomy N/A 09/26/2013    Procedure: EXPLORATORY LAPAROTOMY;  Surgeon: Zenovia Jarred, MD;  Location: [place];  Service: General;  Laterality: N/A;  . Inguinal hernia repair Right 11/08/2013    Procedure: inguinal hernia repair with biologic mesh;  Surgeon: Ralene Ok, MD;  Location: [place];  Service: General;  Laterality: Right;  . Orchiectomy Right 11/08/2013    Procedure: ORCHIECTOMY;  Surgeon: Ralene Ok, MD;  Location: [place];   Service: General;  Laterality: Right;   No family history on file. History  Substance Use Topics  . Smoking status: Current Every Day Smoker -- 1.00 packs/day for 15 years    Types: Cigarettes  . Smokeless tobacco: Former Systems developer  . Alcohol Use: 7.2 oz/week    12 Cans of beer per week     Comment: Pt reports has not had a drink in 3 months, states used to be heavy drinker    Review of Systems  Constitutional: Negative for fever.  HENT: Positive for ear pain. Negative for congestion and sore throat.   Respiratory: Negative for cough.   All other systems reviewed and are negative.    Allergies  Review of patient's allergies indicates no known allergies.  Home Medications   Current Outpatient Rx  Name  Route  Sig  Dispense  Refill  . acetaminophen (TYLENOL) 325 MG tablet   Oral   Take 650 mg by mouth every 6 (six) hours as needed.         Marland Kitchen albuterol (PROVENTIL HFA;VENTOLIN HFA) 108 (90 BASE) MCG/ACT inhaler   Inhalation   Inhale 2 puffs into the lungs every 6 (six) hours as needed for wheezing.         . Colchicine 0.6 MG CAPS   Oral   Take 0.6 mg by mouth every other day.          Marland Kitchen  diltiazem (CARDIZEM CD) 120 MG 24 hr capsule   Oral   Take 120 mg by mouth every morning.          . Fluticasone-Salmeterol (ADVAIR) 250-50 MCG/DOSE AEPB   Inhalation   Inhale 1 puff into the lungs every 12 (twelve) hours.         . gabapentin (NEURONTIN) 100 MG capsule   Oral   Take 100 mg by mouth 3 (three) times daily.         Marland Kitchen ipratropium-albuterol (DUONEB) 0.5-2.5 (3) MG/3ML SOLN   Nebulization   Take 3 mLs by nebulization every 6 (six) hours as needed (wheezing, shortness of breath).         . lamoTRIgine (LAMICTAL) 25 MG tablet   Oral   Take 50 mg by mouth at bedtime.         . metoprolol succinate (TOPROL-XL) 25 MG 24 hr tablet   Oral   Take 1 tablet (25 mg total) by mouth every evening.         . nicotine (NICODERM CQ - DOSED IN MG/24 HOURS) 14  mg/24hr patch   Transdermal   Place 14 mg onto the skin daily.         Marland Kitchen oxyCODONE (ROXICODONE) 5 MG/5ML solution   Oral   Take 10 mg by mouth every 4 (four) hours as needed for moderate pain or severe pain.         . pantoprazole (PROTONIX) 40 MG tablet   Oral   Take 40 mg by mouth daily.         . Probiotic Product (PROBIOTIC DAILY PO)   Oral   Take 1 tablet by mouth 2 (two) times daily.         . traMADol (ULTRAM) 50 MG tablet      Take one tablet by mouth every 6 hours as needed for pain   120 tablet   5   . traZODone (DESYREL) 100 MG tablet   Oral   Take 100 mg by mouth at bedtime.         . Vitamin D, Ergocalciferol, (DRISDOL) 50000 UNITS CAPS capsule   Oral   Take 50,000 Units by mouth every 30 (thirty) days.         Marland Kitchen warfarin (COUMADIN) 1 MG tablet   Oral   Take 0.5 mg by mouth daily. Take with 5mg  tablet for a total of 5.5mg .         . warfarin (COUMADIN) 5 MG tablet   Oral   Take 5 mg by mouth daily. Take with 0.5 mg for a total of 5.5mg  daily         . carbamide peroxide (DEBROX) 6.5 % otic solution   Left Ear   Place 5 drops into the left ear 2 (two) times daily.   15 mL   0   . nystatin (MYCOSTATIN) 100000 UNIT/ML suspension   Oral   Take 5 mLs (500,000 Units total) by mouth 4 (four) times daily.   60 mL   0    BP 134/66  Pulse 54  Temp(Src) 98.2 F (36.8 C) (Oral)  Resp 12  Ht 5' 6.5" (1.689 m)  SpO2 97% Physical Exam  Nursing note and vitals reviewed. Constitutional: He is oriented to person, place, and time. He appears well-developed and well-nourished.  HENT:  Head: Normocephalic and atraumatic.  Right Ear: Hearing, tympanic membrane, external ear and ear canal normal.  Left Ear: Hearing and ear canal normal. There  is tenderness. No mastoid tenderness.  Mouth/Throat: Oropharynx is clear and moist.  Right Ear: canal clear, TM-normal.   Left Ear: external ear tenderness, no mastoid tenderness, erythema or edema.   Cerumen impaction. TM unable to be visualized due to cerumen.   Oral Pharynx: Soft palate-white diffuse patches. Buccal mucosa. White patches. No tonsillar erythema, edema, or exudate. No lesion on tongue.  Lower lip-ulceration    Eyes: EOM are normal.  Neck: Normal range of motion.  Cardiovascular: Normal rate.   Pulmonary/Chest: Effort normal.  Musculoskeletal: Normal range of motion.  Neurological: He is alert and oriented to person, place, and time.  Skin: Skin is warm and dry.  Psychiatric: He has a normal mood and affect. His behavior is normal.   ED Course  Procedures (including critical care time) DIAGNOSTIC STUDIES: Oxygen Saturation is 97% on room air, normal by my interpretation.    COORDINATION OF CARE: At 5:31 PM: Discussed treatment plan with patient. Patient agrees.   Labs Review Labs Reviewed - No data to display Imaging Review No results found.  EKG Interpretation   None       MDM   Final diagnoses:  Impacted cerumen of left ear  Oral thrush   On exam, pt appears well, non-toxic, normal hearing. Pt does have cerumen impaction of left ear. TM unable to be visualized.  Pt also appears to have oral thrush, likely due to use of Advair. No evidence of mastoiditis or tonsillar abscess.   According to pharmacy, pt has order of ciprofloxacin ear drops just ordered for him today at [facility]. Cerumen removal attempted in ED, unsuccessful, will discharge pt home with Debrox.  Advised this medication with take several days to take affect. F/u with PCP later this week for recheck of left ear.   I personally performed the services described in this documentation, which was scribed in my presence. The recorded information has been reviewed and is accurate.      Zachary Fordyce, PA-C 01/26/14 1919

## 2014-02-04 ENCOUNTER — Ambulatory Visit (INDEPENDENT_AMBULATORY_CARE_PROVIDER_SITE_OTHER): Payer: Medicare Other | Admitting: General Surgery

## 2014-02-04 ENCOUNTER — Encounter (INDEPENDENT_AMBULATORY_CARE_PROVIDER_SITE_OTHER): Payer: Self-pay | Admitting: General Surgery

## 2014-02-04 VITALS — BP 126/74 | HR 75 | Temp 98.4°F | Resp 16 | Ht 66.0 in | Wt 166.0 lb

## 2014-02-04 DIAGNOSIS — Z9889 Other specified postprocedural states: Secondary | ICD-10-CM

## 2014-02-04 DIAGNOSIS — Z9049 Acquired absence of other specified parts of digestive tract: Secondary | ICD-10-CM

## 2014-02-04 DIAGNOSIS — K432 Incisional hernia without obstruction or gangrene: Secondary | ICD-10-CM

## 2014-02-04 DIAGNOSIS — Z8719 Personal history of other diseases of the digestive system: Secondary | ICD-10-CM

## 2014-02-04 NOTE — Progress Notes (Signed)
Subjective:     Patient ID: Zachary [name], male   DOB: 1941-07-31, 73 y.o.   MRN: [id number]  HPI Patient returns for followup status post emergent right inguinal hernia repair and emergent colectomy with colostomy for colon perforation. He has quit smoking. He continues to live at [facility]. He requires oxygen and presents in a wheelchair. He is able to be angulated at times with a walker. He expresses some interest in colostomy takedown.  Review of Systems     Objective:   Physical Exam  Constitutional: He is oriented to person, place, and time. No distress.  HENT:  Head: Normocephalic.  Neck: Normal range of motion. Neck supple.  Cardiovascular: Normal rate and normal heart sounds.   Pulmonary/Chest: Effort normal and breath sounds normal.  Abdominal: Soft. He exhibits no distension. There is no tenderness. There is no rebound.    Upper midline incisional hernia reducible associated with old scar, new or lower midline scar intact, left lower quadrant colostomy pink and viable with good stool output, right inguinal incision clean dry and intact with no evidence of hernia recurrence  Neurological: He is alert and oriented to person, place, and time.  Skin: Skin is warm.       Assessment:     Status post emergent repair of right inguinal hernia, doing well. Status post emergent colectomy and colostomy. Doing well. Stable old upper midline incisional hernia. Multiple medical problems including pulmonary dysfunction.    Plan:     I do not feel he is a good candidate medically for colostomy takedown. I will treat it with his primary care physician and see if pulmonary function tests would be of any benefit to finalize this decision.

## 2014-03-08 ENCOUNTER — Emergency Department (HOSPITAL_COMMUNITY)
Admission: EM | Admit: 2014-03-08 | Discharge: 2014-03-08 | Disposition: A | Discharge status: Home / Self Care | Payer: PRIVATE HEALTH INSURANCE | Attending: Emergency Medicine | Admitting: Emergency Medicine

## 2014-03-08 ENCOUNTER — Emergency Department (HOSPITAL_COMMUNITY): Payer: PRIVATE HEALTH INSURANCE

## 2014-03-08 ENCOUNTER — Encounter (HOSPITAL_COMMUNITY): Payer: Self-pay | Admitting: Emergency Medicine

## 2014-03-08 DIAGNOSIS — F329 Major depressive disorder, single episode, unspecified: Secondary | ICD-10-CM | POA: Diagnosis not present

## 2014-03-08 DIAGNOSIS — F17200 Nicotine dependence, unspecified, uncomplicated: Secondary | ICD-10-CM | POA: Diagnosis not present

## 2014-03-08 DIAGNOSIS — J449 Chronic obstructive pulmonary disease, unspecified: Secondary | ICD-10-CM | POA: Diagnosis not present

## 2014-03-08 DIAGNOSIS — K921 Melena: Secondary | ICD-10-CM | POA: Diagnosis not present

## 2014-03-08 DIAGNOSIS — F3289 Other specified depressive episodes: Secondary | ICD-10-CM | POA: Insufficient documentation

## 2014-03-08 DIAGNOSIS — Z9889 Other specified postprocedural states: Secondary | ICD-10-CM | POA: Insufficient documentation

## 2014-03-08 DIAGNOSIS — M109 Gout, unspecified: Secondary | ICD-10-CM | POA: Diagnosis not present

## 2014-03-08 DIAGNOSIS — J4489 Other specified chronic obstructive pulmonary disease: Secondary | ICD-10-CM | POA: Insufficient documentation

## 2014-03-08 DIAGNOSIS — F411 Generalized anxiety disorder: Secondary | ICD-10-CM | POA: Diagnosis not present

## 2014-03-08 DIAGNOSIS — I10 Essential (primary) hypertension: Secondary | ICD-10-CM | POA: Insufficient documentation

## 2014-03-08 DIAGNOSIS — IMO0002 Reserved for concepts with insufficient information to code with codable children: Secondary | ICD-10-CM | POA: Diagnosis not present

## 2014-03-08 DIAGNOSIS — Z7901 Long term (current) use of anticoagulants: Secondary | ICD-10-CM | POA: Insufficient documentation

## 2014-03-08 DIAGNOSIS — I4891 Unspecified atrial fibrillation: Secondary | ICD-10-CM | POA: Insufficient documentation

## 2014-03-08 DIAGNOSIS — E785 Hyperlipidemia, unspecified: Secondary | ICD-10-CM | POA: Diagnosis not present

## 2014-03-08 DIAGNOSIS — Z933 Colostomy status: Secondary | ICD-10-CM | POA: Diagnosis not present

## 2014-03-08 DIAGNOSIS — I2510 Atherosclerotic heart disease of native coronary artery without angina pectoris: Secondary | ICD-10-CM | POA: Diagnosis not present

## 2014-03-08 DIAGNOSIS — I209 Angina pectoris, unspecified: Secondary | ICD-10-CM | POA: Diagnosis not present

## 2014-03-08 DIAGNOSIS — Z79899 Other long term (current) drug therapy: Secondary | ICD-10-CM | POA: Insufficient documentation

## 2014-03-08 DIAGNOSIS — R58 Hemorrhage, not elsewhere classified: Secondary | ICD-10-CM | POA: Diagnosis present

## 2014-03-08 LAB — COMPREHENSIVE METABOLIC PANEL
ALT: 91 U/L — ABNORMAL HIGH (ref 0–53)
AST: 87 U/L — AB (ref 0–37)
Albumin: 2.7 g/dL — ABNORMAL LOW (ref 3.5–5.2)
Alkaline Phosphatase: 107 U/L (ref 39–117)
BUN: 13 mg/dL (ref 6–23)
CALCIUM: 9.4 mg/dL (ref 8.4–10.5)
CO2: 26 meq/L (ref 19–32)
Chloride: 101 mEq/L (ref 96–112)
Creatinine, Ser: 1.12 mg/dL (ref 0.50–1.35)
GFR calc Af Amer: 74 mL/min — ABNORMAL LOW (ref >90)
GFR calc non Af Amer: 64 mL/min — ABNORMAL LOW (ref >90)
Glucose, Bld: 69 mg/dL — ABNORMAL LOW (ref 70–99)
POTASSIUM: 4.3 meq/L (ref 3.7–5.3)
SODIUM: 138 meq/L (ref 137–147)
Total Bilirubin: 0.4 mg/dL (ref 0.3–1.2)
Total Protein: 7 g/dL (ref 6.0–8.3)

## 2014-03-08 LAB — CBC WITH DIFFERENTIAL/PLATELET
BASOS ABS: 0 10*3/uL (ref 0.0–0.1)
Basophils Relative: 0 % (ref 0–1)
EOS PCT: 1 % (ref 0–5)
Eosinophils Absolute: 0.1 10*3/uL (ref 0.0–0.7)
HCT: 42.3 % (ref 39.0–52.0)
Hemoglobin: 14.3 g/dL (ref 13.0–17.0)
LYMPHS PCT: 31 % (ref 12–46)
Lymphs Abs: 2.6 10*3/uL (ref 0.7–4.0)
MCH: 32 pg (ref 26.0–34.0)
MCHC: 33.8 g/dL (ref 30.0–36.0)
MCV: 94.6 fL (ref 78.0–100.0)
Monocytes Absolute: 0.8 10*3/uL (ref 0.1–1.0)
Monocytes Relative: 9 % (ref 3–12)
NEUTROS PCT: 59 % (ref 43–77)
Neutro Abs: 4.8 10*3/uL (ref 1.7–7.7)
PLATELETS: 167 10*3/uL (ref 150–400)
RBC: 4.47 MIL/uL (ref 4.22–5.81)
RDW: 16.5 % — AB (ref 11.5–15.5)
WBC: 8.2 10*3/uL (ref 4.0–10.5)

## 2014-03-08 LAB — TYPE AND SCREEN
ABO/RH(D): O POS
Antibody Screen: NEGATIVE

## 2014-03-08 LAB — PROTIME-INR
INR: 2.22 — ABNORMAL HIGH (ref 0.00–1.49)
Prothrombin Time: 23.9 seconds — ABNORMAL HIGH (ref 11.6–15.2)

## 2014-03-08 LAB — APTT: APTT: 39 s — AB (ref 24–37)

## 2014-03-08 MED ORDER — OXYCODONE HCL 5 MG PO TABS
5.0000 mg | ORAL_TABLET | Freq: Once | ORAL | Status: AC
  Administered 2014-03-08: 5 mg via ORAL
  Filled 2014-03-08: qty 1

## 2014-03-08 MED ORDER — SODIUM CHLORIDE 0.9 % IV BOLUS (SEPSIS)
1000.0000 mL | Freq: Once | INTRAVENOUS | Status: AC
  Administered 2014-03-08: 1000 mL via INTRAVENOUS

## 2014-03-08 NOTE — ED Notes (Signed)
Pt presents with bright red blood to his colostomy bag since 11:45. Pt is from [place]. Per EMS the RN states that patient had diarrhea on Friday and "had inflammation" to the stoma since this time. Pt has had a new type of bag that he has previously "irritated by"

## 2014-03-08 NOTE — ED Notes (Signed)
Report called to Velva Harman at [facility].

## 2014-03-08 NOTE — Discharge Instructions (Signed)

## 2014-03-08 NOTE — ED Provider Notes (Signed)
CSN: [id number]     Arrival date & time 03/08/14  1354 History   First MD Initiated Contact with Patient 03/08/14 1401     No chief complaint on file.    (Consider location/radiation/quality/duration/timing/severity/associated sxs/prior Treatment) HPI Pt with complex history of multiple abdominal surgeries has a LLQ colostomy, currently living at [facility]. He states he asked that his ostomy be changed from a 'two piece to a one piece' yesterday due to the smell. He noticed some irritation from the prior ostomy at the time of the change. This morning he has had some bright red blood mixed with soft stool in his ostomy bag. Denies any vomiting. No abdominal pain, but has felt bloated recently. He is on coumadin for chronic afib.  Past Medical History  Diagnosis Date  . Hypertension   . Coronary artery disease   . Hyperlipemia   . Angina   . Shortness of breath   . Mental disorder   . Arthritis   . COPD (chronic obstructive pulmonary disease)   . Hiatal hernia   . Depression   . Gout   . Joint pain   . Depression   . Anxiety   . Ileus, postoperative 09/29/2013  . PAF (paroxysmal atrial fibrillation) 09/29/2013    On lopressor and Cardizem at home, no anticoagulation.  . S/P colostomy    Past Surgical History  Procedure Laterality Date  . Abdominal surgery    . Laparotomy N/A 09/26/2013    Procedure: EXPLORATORY LAPAROTOMY;  Surgeon: Zenovia Jarred, MD;  Location: [place];  Service: General;  Laterality: N/A;  . Inguinal hernia repair Right 11/08/2013    Procedure: inguinal hernia repair with biologic mesh;  Surgeon: Ralene Ok, MD;  Location: [place];  Service: General;  Laterality: Right;  . Orchiectomy Right 11/08/2013    Procedure: ORCHIECTOMY;  Surgeon: Ralene Ok, MD;  Location: [place];  Service: General;  Laterality: Right;   History reviewed. No pertinent family history. History  Substance Use Topics  . Smoking status: Current Every Day Smoker -- 1.00 packs/day  for 15 years    Types: Cigarettes  . Smokeless tobacco: Former Systems developer  . Alcohol Use: 7.2 oz/week    12 Cans of beer per week     Comment: Pt reports has not had a drink in 3 months, states used to be heavy drinker    Review of Systems All other systems reviewed and are negative except as noted in HPI.     Allergies  Review of patient's allergies indicates no known allergies.  Home Medications   Current Outpatient Rx  Name  Route  Sig  Dispense  Refill  . acetaminophen (TYLENOL) 325 MG tablet   Oral   Take 650 mg by mouth every 6 (six) hours as needed.         Marland Kitchen albuterol (PROVENTIL HFA;VENTOLIN HFA) 108 (90 BASE) MCG/ACT inhaler   Inhalation   Inhale 2 puffs into the lungs every 6 (six) hours as needed for wheezing.         . carbamide peroxide (DEBROX) 6.5 % otic solution   Left Ear   Place 5 drops into the left ear 2 (two) times daily.   15 mL   0   . Colchicine 0.6 MG CAPS   Oral   Take 0.6 mg by mouth every other day.          . diltiazem (CARDIZEM CD) 120 MG 24 hr capsule   Oral   Take 120  mg by mouth every morning.          . Fluticasone-Salmeterol (ADVAIR) 250-50 MCG/DOSE AEPB   Inhalation   Inhale 1 puff into the lungs every 12 (twelve) hours.         . gabapentin (NEURONTIN) 100 MG capsule   Oral   Take 100 mg by mouth 3 (three) times daily.         Marland Kitchen ipratropium-albuterol (DUONEB) 0.5-2.5 (3) MG/3ML SOLN   Nebulization   Take 3 mLs by nebulization every 6 (six) hours as needed (wheezing, shortness of breath).         . lamoTRIgine (LAMICTAL) 25 MG tablet   Oral   Take 50 mg by mouth at bedtime.         . metoprolol succinate (TOPROL-XL) 25 MG 24 hr tablet   Oral   Take 1 tablet (25 mg total) by mouth every evening.         . nicotine (NICODERM CQ - DOSED IN MG/24 HOURS) 14 mg/24hr patch   Transdermal   Place 14 mg onto the skin daily.         Marland Kitchen nystatin (MYCOSTATIN) 100000 UNIT/ML suspension   Oral   Take 5 mLs (500,000  Units total) by mouth 4 (four) times daily.   60 mL   0   . oxyCODONE (ROXICODONE) 5 MG/5ML solution   Oral   Take 10 mg by mouth every 4 (four) hours as needed for moderate pain or severe pain.         . pantoprazole (PROTONIX) 40 MG tablet   Oral   Take 40 mg by mouth daily.         . Probiotic Product (PROBIOTIC DAILY PO)   Oral   Take 1 tablet by mouth 2 (two) times daily.         . traMADol (ULTRAM) 50 MG tablet      Take one tablet by mouth every 6 hours as needed for pain   120 tablet   5   . traZODone (DESYREL) 100 MG tablet   Oral   Take 100 mg by mouth at bedtime.         . Vitamin D, Ergocalciferol, (DRISDOL) 50000 UNITS CAPS capsule   Oral   Take 50,000 Units by mouth every 30 (thirty) days.         Marland Kitchen warfarin (COUMADIN) 1 MG tablet   Oral   Take 0.5 mg by mouth daily. Take with 5mg  tablet for a total of 5.5mg .         . warfarin (COUMADIN) 5 MG tablet   Oral   Take 5 mg by mouth daily. Take with 0.5 mg for a total of 5.5mg  daily          BP 152/60  Pulse 57  Temp(Src) 98.8 F (37.1 C) (Oral)  Resp 16  Ht 5\' 6"  (1.676 m)  Wt 165 lb (74.844 kg)  BMI 26.64 kg/m2  SpO2 97% Physical Exam  Nursing note and vitals reviewed. Constitutional: He is oriented to person, place, and time. He appears well-developed and well-nourished.  HENT:  Head: Normocephalic and atraumatic.  Eyes: EOM are normal. Pupils are equal, round, and reactive to light.  Neck: Normal range of motion. Neck supple.  Cardiovascular: Normal rate, normal heart sounds and intact distal pulses.   Pulmonary/Chest: Effort normal and breath sounds normal.  Abdominal: Soft. Bowel sounds are normal. He exhibits distension (bloated by not tight). There is no tenderness. There  is no rebound and no guarding.  Ostomy in LLQ pink, no active bleeding, but small amount of red blood in the bag with normal soft stool  Musculoskeletal: Normal range of motion. He exhibits no edema and no  tenderness.  Neurological: He is alert and oriented to person, place, and time. He has normal strength. No cranial nerve deficit or sensory deficit.  Skin: Skin is warm and dry. No rash noted.  Psychiatric: He has a normal mood and affect.    ED Course  Procedures (including critical care time) Labs Review Labs Reviewed  CBC WITH DIFFERENTIAL - Abnormal; Notable for the following:    RDW 16.5 (*)    All other components within normal limits  COMPREHENSIVE METABOLIC PANEL - Abnormal; Notable for the following:    Glucose, Bld 69 (*)    Albumin 2.7 (*)    AST 87 (*)    ALT 91 (*)    GFR calc non Af Amer 64 (*)    GFR calc Af Amer 74 (*)    All other components within normal limits  PROTIME-INR - Abnormal; Notable for the following:    Prothrombin Time 23.9 (*)    INR 2.22 (*)    All other components within normal limits  APTT - Abnormal; Notable for the following:    aPTT 39 (*)    All other components within normal limits  TYPE AND SCREEN   Imaging Review Dg Abd Acute W/chest  03/08/2014   CLINICAL DATA:  Abdominal distention and tenderness. Blood in the left lower quadrant ostomy.  EXAM: ACUTE ABDOMEN SERIES (ABDOMEN 2 VIEW & CHEST 1 VIEW)  COMPARISON:  01/22/2014  FINDINGS: Heart size and vascularity are normal and the lungs are clear.  No free air or free fluid in the abdomen. Bowel gas pattern is normal. No acute osseous abnormalities. No worrisome abdominal calcifications.  IMPRESSION: Benign appearing abdomen and chest.   Electronically Signed   By: Rozetta Nunnery M.D.   On: 03/08/2014 16:29     EKG Interpretation None      MDM   Final diagnoses:  Blood in stool   Labs and imaging reviewed. Discussed with Dr. Grandville Silos on call for Gen Surg who agrees with workup, does not feel any utility in taking down ostomy bag. Recommends outpatient followup.     Charles B. Karle Starch, MD 03/08/14 1719

## 2014-03-08 NOTE — ED Notes (Signed)
[facility] called to transport pt back to [facility]

## 2014-03-08 NOTE — ED Notes (Signed)
MD at bedside. 

## 2014-03-08 NOTE — ED Notes (Signed)
Pt voided in urinal.  Resting quietly at this time with no complaints.  Placed back on cardiac monitor after returning from x-ray.

## 2014-03-23 ENCOUNTER — Telehealth (INDEPENDENT_AMBULATORY_CARE_PROVIDER_SITE_OTHER): Payer: Self-pay

## 2014-03-23 NOTE — Telephone Encounter (Signed)
Facility calling to make a new patient appointment for blood in stool.  Advised the nurse that we would need the patient to be seen by his PCP or GI for an initial workup before being scheduled to see a surgeon.  They will contact his PCP.

## 2014-04-01 ENCOUNTER — Non-Acute Institutional Stay (SKILLED_NURSING_FACILITY): Payer: PRIVATE HEALTH INSURANCE | Admitting: Internal Medicine

## 2014-04-01 DIAGNOSIS — I2510 Atherosclerotic heart disease of native coronary artery without angina pectoris: Secondary | ICD-10-CM

## 2014-04-01 DIAGNOSIS — I4891 Unspecified atrial fibrillation: Secondary | ICD-10-CM

## 2014-04-01 DIAGNOSIS — I10 Essential (primary) hypertension: Secondary | ICD-10-CM

## 2014-04-01 DIAGNOSIS — J449 Chronic obstructive pulmonary disease, unspecified: Secondary | ICD-10-CM

## 2014-04-02 NOTE — Progress Notes (Signed)
         PROGRESS NOTE  DATE: 4 -29-15  FACILITY: Nursing Home Location: [facility]  LEVEL OF CARE: SNF (31)  Routine Visit  CHIEF COMPLAINT:  Manage atrial fibrillation, COPD and hypertension  HISTORY OF PRESENT ILLNESS:  REASSESSMENT OF ONGOING PROBLEM(S):  ATRIAL FIBRILLATION: the patients atrial fibrillation remains stable.  The staff deny DOE, tachycardia, orthopnea, transient neurological sx, pedal edema, palpitations, & PNDs.  No complications noted from the medications currently being used.  COPD: the COPD remains stable.  Staff  deny sob, cough, wheezing or declining exercise tolerance.  No complications from the medications presently being used.  HTN: Pt 's HTN remains stable.  The staff Deny CP, sob, DOE, pedal edema, headaches, dizziness or visual disturbances.  No complications from the medications currently being used.  Last BP : 106/60, 122/68, 134/64, 119/62  PAST MEDICAL HISTORY : Reviewed.  No changes.  CURRENT MEDICATIONS: Reviewed per [facility]  REVIEW OF SYSTEMS: Unobtainable, patient is a poor historian  PHYSICAL EXAMINATION  VS:  See vital sign section  GENERAL: no acute distress, normal body habitus EYES: conjunctivae normal, sclerae normal, normal eye lids NECK: supple, trachea midline, no neck masses, no thyroid tenderness, no thyromegaly LYMPHATICS: no LAN in the neck, no supraclavicular LAN RESPIRATORY: breathing is even & unlabored, BS CTAB CARDIAC: Heart rate is irregularly irregular, no murmur,no extra heart sounds, no edema GI: abdomen soft, normal BS, no masses, no tenderness, no hepatomegaly, no splenomegaly, left lower quadrant colostomy in place PSYCHIATRIC: the patient is alert & disoriented, affect & behavior appropriate  LABS/RADIOLOGY: 4-15 CBC normal, AST 73, ALT 68 otherwise CMP normal 1-15 hemoglobin 8.9, MCV 82 otherwise CBC normal, total protein 5.8, albumin is 2.8 otherwise CMP normal, ferritin 56, TIBC 308, serum  iron level 28, percent saturation 9, vitamin B12 level 72, folate 17.1  11-14 potassium 4.4, hemoglobin 9.2, hemoglobin 8.9, MCV 83, platelets 465, WBC 8.3, BMP normal  9-14 albumin 3.3, AST 51 otherwise liver profile normal, vitamin B12 level 606, folate 15  8-14 WBC 9.6, hemoglobin 11.4, MCV 98, platelets 434, creatinine 2.07 otherwise BMP normal  ASSESSMENT/PLAN:  COPD-well compensated Atrial fibrillation-rate controlled hypertension-well-controlled CAD-stable GERD-stable Elevated liver functions-improved  CPT CODE: 44628  Edgar Frisk. [name], [place] [phone]

## 2014-04-10 ENCOUNTER — Other Ambulatory Visit (HOSPITAL_COMMUNITY): Payer: Self-pay

## 2014-04-10 DIAGNOSIS — J441 Chronic obstructive pulmonary disease with (acute) exacerbation: Secondary | ICD-10-CM

## 2014-04-13 ENCOUNTER — Ambulatory Visit (HOSPITAL_COMMUNITY)
Admission: RE | Admit: 2014-04-13 | Discharge: 2014-04-13 | Disposition: A | Discharge status: Home / Self Care | Payer: PRIVATE HEALTH INSURANCE | Source: Ambulatory Visit | Attending: Internal Medicine | Admitting: Internal Medicine

## 2014-04-13 DIAGNOSIS — J4489 Other specified chronic obstructive pulmonary disease: Secondary | ICD-10-CM | POA: Insufficient documentation

## 2014-04-13 DIAGNOSIS — J449 Chronic obstructive pulmonary disease, unspecified: Secondary | ICD-10-CM | POA: Insufficient documentation

## 2014-04-13 MED ORDER — ALBUTEROL SULFATE (2.5 MG/3ML) 0.083% IN NEBU
2.5000 mg | INHALATION_SOLUTION | Freq: Once | RESPIRATORY_TRACT | Status: AC
  Administered 2014-04-13: 2.5 mg via RESPIRATORY_TRACT

## 2014-04-17 LAB — PULMONARY FUNCTION TEST
DL/VA % PRED: 98 %
DL/VA: 4.33 ml/min/mmHg/L
DLCO COR: 14.4 ml/min/mmHg
DLCO UNC % PRED: 52 %
DLCO UNC: 14.4 ml/min/mmHg
DLCO cor % pred: 52 %
FEF 25-75 POST: 0.4 L/s
FEF 25-75 PRE: 0.42 L/s
FEF2575-%Change-Post: -5 %
FEF2575-%PRED-PRE: 19 %
FEF2575-%Pred-Post: 18 %
FEV1-%Change-Post: -11 %
FEV1-%PRED-POST: 38 %
FEV1-%Pred-Pre: 43 %
FEV1-PRE: 1.07 L
FEV1-Post: 0.95 L
FEV1FVC-%Change-Post: -10 %
FEV1FVC-%PRED-PRE: 66 %
FEV6-%CHANGE-POST: 0 %
FEV6-%PRED-POST: 64 %
FEV6-%Pred-Pre: 64 %
FEV6-POST: 2.02 L
FEV6-Pre: 2.01 L
FEV6FVC-%CHANGE-POST: 1 %
FEV6FVC-%PRED-POST: 100 %
FEV6FVC-%Pred-Pre: 99 %
FVC-%Change-Post: 0 %
FVC-%Pred-Post: 64 %
FVC-%Pred-Pre: 64 %
FVC-POST: 2.11 L
FVC-Pre: 2.12 L
Post FEV1/FVC ratio: 45 %
Post FEV6/FVC ratio: 96 %
Pre FEV1/FVC ratio: 50 %
Pre FEV6/FVC Ratio: 95 %
RV % pred: 125 %
RV: 2.87 L
TLC % pred: 82 %
TLC: 5.24 L

## 2014-04-22 ENCOUNTER — Non-Acute Institutional Stay (SKILLED_NURSING_FACILITY): Payer: PRIVATE HEALTH INSURANCE | Admitting: Internal Medicine

## 2014-04-22 DIAGNOSIS — J4489 Other specified chronic obstructive pulmonary disease: Secondary | ICD-10-CM

## 2014-04-22 DIAGNOSIS — I4891 Unspecified atrial fibrillation: Secondary | ICD-10-CM

## 2014-04-22 DIAGNOSIS — I2510 Atherosclerotic heart disease of native coronary artery without angina pectoris: Secondary | ICD-10-CM

## 2014-04-22 DIAGNOSIS — I10 Essential (primary) hypertension: Secondary | ICD-10-CM

## 2014-04-22 DIAGNOSIS — J449 Chronic obstructive pulmonary disease, unspecified: Secondary | ICD-10-CM

## 2014-04-23 NOTE — Progress Notes (Signed)
          PROGRESS NOTE  DATE: 5 -20-15  FACILITY: Nursing Home Location: [facility]  LEVEL OF CARE: SNF (31)  Routine Visit  CHIEF COMPLAINT:  Manage atrial fibrillation, COPD and hypertension  HISTORY OF PRESENT ILLNESS:  REASSESSMENT OF ONGOING PROBLEM(S):  ATRIAL FIBRILLATION: the patients atrial fibrillation remains stable.  The staff deny DOE, tachycardia, orthopnea, transient neurological sx, pedal edema, palpitations, & PNDs.  No complications noted from the medications currently being used.  COPD: the COPD remains stable.  Staff  deny sob, cough, wheezing or declining exercise tolerance.  No complications from the medications presently being used.  HTN: Pt 's HTN remains stable.  The staff Deny CP, sob, DOE, pedal edema, headaches, dizziness or visual disturbances.  No complications from the medications currently being used.  Last BP : 106/60, 122/68, 134/64, 119/62, 136/68  PAST MEDICAL HISTORY : Reviewed.  No changes.  CURRENT MEDICATIONS: Reviewed per [facility]  REVIEW OF SYSTEMS: Unobtainable, patient is a poor historian  PHYSICAL EXAMINATION  VS:  See vital sign section  GENERAL: no acute distress, normal body habitus EYES: conjunctivae normal, sclerae normal, normal eye lids NECK: supple, trachea midline, no neck masses, no thyroid tenderness, no thyromegaly LYMPHATICS: no LAN in the neck, no supraclavicular LAN RESPIRATORY: breathing is even & unlabored, BS CTAB CARDIAC: Heart rate is irregularly irregular, no murmur,no extra heart sounds, no edema GI: abdomen soft, normal BS, no masses, no tenderness, no hepatomegaly, no splenomegaly, left lower quadrant colostomy in place PSYCHIATRIC: the patient is alert & disoriented, affect & behavior appropriate  LABS/RADIOLOGY: 4-15 CBC normal, AST 73, ALT 68 otherwise CMP normal 1-15 hemoglobin 8.9, MCV 82 otherwise CBC normal, total protein 5.8, albumin is 2.8 otherwise CMP normal, ferritin 56, TIBC  308, serum iron level 28, percent saturation 9, vitamin B12 level 72, folate 17.1  11-14 potassium 4.4, hemoglobin 9.2, hemoglobin 8.9, MCV 83, platelets 465, WBC 8.3, BMP normal  9-14 albumin 3.3, AST 51 otherwise liver profile normal, vitamin B12 level 606, folate 15  8-14 WBC 9.6, hemoglobin 11.4, MCV 98, platelets 434, creatinine 2.07 otherwise BMP normal  ASSESSMENT/PLAN:  COPD-well compensated Atrial fibrillation-rate controlled hypertension-well-controlled CAD-stable GERD-stable Elevated liver functions-improved  CPT CODE: 70017  Zachary [name]. Zachary [name], [name] [phone]

## 2014-05-06 ENCOUNTER — Other Ambulatory Visit: Payer: Self-pay | Admitting: *Deleted

## 2014-05-06 MED ORDER — OXYCODONE HCL 5 MG PO TABS: ORAL_TABLET | ORAL | Status: DC

## 2014-05-06 NOTE — Telephone Encounter (Signed)
[facility] 

## 2014-05-13 ENCOUNTER — Non-Acute Institutional Stay (SKILLED_NURSING_FACILITY): Payer: PRIVATE HEALTH INSURANCE | Admitting: Internal Medicine

## 2014-05-13 DIAGNOSIS — I4891 Unspecified atrial fibrillation: Secondary | ICD-10-CM

## 2014-05-13 DIAGNOSIS — I2510 Atherosclerotic heart disease of native coronary artery without angina pectoris: Secondary | ICD-10-CM

## 2014-05-13 DIAGNOSIS — J449 Chronic obstructive pulmonary disease, unspecified: Secondary | ICD-10-CM

## 2014-05-13 DIAGNOSIS — I10 Essential (primary) hypertension: Secondary | ICD-10-CM

## 2014-05-13 NOTE — Progress Notes (Signed)
         PROGRESS NOTE  DATE: 6 -10-15  FACILITY: Nursing Home Location: [facility]  LEVEL OF CARE: SNF (31)  Routine Visit  CHIEF COMPLAINT:  Manage atrial fibrillation, COPD and hypertension  HISTORY OF PRESENT ILLNESS:  REASSESSMENT OF ONGOING PROBLEM(S):  ATRIAL FIBRILLATION: the patients atrial fibrillation remains stable.  The patient denies DOE, tachycardia, orthopnea, transient neurological sx, pedal edema, palpitations, & PNDs.  No complications noted from the medications currently being used.  COPD: the COPD remains stable.  Patient  denies sob, cough, wheezing or declining exercise tolerance.  No complications from the medications presently being used.  HTN: Pt 's HTN remains stable.  The patient Denies CP, sob, DOE, pedal edema, headaches, dizziness or visual disturbances.  No complications from the medications currently being used.  Last BP : 106/60, 122/68, 134/64, 119/62, 136/68, 140/66  PAST MEDICAL HISTORY : Reviewed.  No changes.  CURRENT MEDICATIONS: Reviewed per [facility]  REVIEW OF SYSTEMS:  GEN: No weakness, no weight loss RESPIRATORY: Denies shortness of breath, cough or wheezing CARDIAC: Denies chest pains or palpitations GI: Denies abdominal pain, nausea, vomiting, diarrhea or constipation   PHYSICAL EXAMINATION  VS:  See vital sign section  GENERAL: no acute distress, normal body habitus EYES: conjunctivae normal, sclerae normal, normal eye lids NECK: supple, trachea midline, no neck masses, no thyroid tenderness, no thyromegaly LYMPHATICS: no LAN in the neck, no supraclavicular LAN RESPIRATORY: breathing is even & unlabored, BS CTAB CARDIAC: Heart rate is irregularly irregular, no murmur,no extra heart sounds, no edema GI: abdomen soft, normal BS, no masses, no tenderness, no hepatomegaly, no splenomegaly, left lower quadrant colostomy in place PSYCHIATRIC: the patient is alert & disoriented, affect & behavior  appropriate  LABS/RADIOLOGY: 4-15 CBC normal, AST 73, ALT 68 otherwise CMP normal 1-15 hemoglobin 8.9, MCV 82 otherwise CBC normal, total protein 5.8, albumin is 2.8 otherwise CMP normal, ferritin 56, TIBC 308, serum iron level 28, percent saturation 9, vitamin B12 level 72, folate 17.1  11-14 potassium 4.4, hemoglobin 9.2, hemoglobin 8.9, MCV 83, platelets 465, WBC 8.3, BMP normal  9-14 albumin 3.3, AST 51 otherwise liver profile normal, vitamin B12 level 606, folate 15  8-14 WBC 9.6, hemoglobin 11.4, MCV 98, platelets 434, creatinine 2.07 otherwise BMP normal  ASSESSMENT/PLAN:  COPD-well compensated Atrial fibrillation-rate controlled hypertension-blood pressure borderline. Will monitor.  CAD-stable GERD-stable Elevated liver functions-improved  CPT CODE: 34196  Edgar Frisk. [name], [place] [phone]

## 2014-05-18 ENCOUNTER — Ambulatory Visit (INDEPENDENT_AMBULATORY_CARE_PROVIDER_SITE_OTHER): Payer: PRIVATE HEALTH INSURANCE

## 2014-05-18 ENCOUNTER — Encounter: Payer: Self-pay | Admitting: Gastroenterology

## 2014-05-18 ENCOUNTER — Ambulatory Visit (INDEPENDENT_AMBULATORY_CARE_PROVIDER_SITE_OTHER): Payer: Medicare Other | Admitting: Gastroenterology

## 2014-05-18 VITALS — BP 110/80 | HR 60 | Ht 66.5 in | Wt 173.8 lb

## 2014-05-18 DIAGNOSIS — R7989 Other specified abnormal findings of blood chemistry: Secondary | ICD-10-CM

## 2014-05-18 DIAGNOSIS — R945 Abnormal results of liver function studies: Secondary | ICD-10-CM | POA: Insufficient documentation

## 2014-05-18 DIAGNOSIS — I2510 Atherosclerotic heart disease of native coronary artery without angina pectoris: Secondary | ICD-10-CM

## 2014-05-18 LAB — COMPREHENSIVE METABOLIC PANEL
ALT: 63 U/L — AB (ref 0–53)
AST: 67 U/L — ABNORMAL HIGH (ref 0–37)
Albumin: 3.4 g/dL — ABNORMAL LOW (ref 3.5–5.2)
Alkaline Phosphatase: 100 U/L (ref 39–117)
BUN: 13 mg/dL (ref 6–23)
CALCIUM: 9.3 mg/dL (ref 8.4–10.5)
CHLORIDE: 105 meq/L (ref 96–112)
CO2: 26 meq/L (ref 19–32)
CREATININE: 1.2 mg/dL (ref 0.4–1.5)
GFR: 77.42 mL/min (ref >60.00)
Glucose, Bld: 106 mg/dL — ABNORMAL HIGH (ref 70–99)
Potassium: 4.1 mEq/L (ref 3.5–5.1)
SODIUM: 138 meq/L (ref 135–145)
TOTAL PROTEIN: 7.2 g/dL (ref 6.0–8.3)
Total Bilirubin: 0.7 mg/dL (ref 0.2–1.2)

## 2014-05-18 NOTE — Assessment & Plan Note (Signed)
The patient has history of alcoholic cirrhosis and hepatitis B.  LFT abnormalities may be reflective of this.  Hepatic steatosis is also a possibility.  No a possible low attenuation lesion in the right lobe of the liver previously described.   Recommendations #1 check serologies for hepatitis A., B., C. and HBV DNA #2 repeat CT scan

## 2014-05-18 NOTE — Progress Notes (Signed)
          History of Present Illness:  The patient has returned for evaluation of abnormal liver tests.  On routine testing AST was 87 and ALT 91 in April, 2015.  Patient has no complaints referable to his liver.  CT scan in October, 2014 demonstrated a heterogeneous low-attenuation lesion in the posterior segment of the right lobe of the liver.  Patient believes he had a blood transfusion 10-15 years ago.  There is no history of IV drug use.  He was a heavy drinker in the past.  There is a note in the chart that states that he had hepatitis B.   Review of Systems: Pertinent positive and negative review of systems were noted in the above HPI section. All other review of systems were otherwise negative.    Current Medications, Allergies, Past Medical History, Past Surgical History, Family History and Social History were reviewed in [place] record  Vital signs were reviewed in today's medical record. Physical Exam: General: Well developed , well nourished, no acute distress Skin: anicteric Head: Normocephalic and atraumatic Eyes:  sclerae anicteric, EOMI Ears: Normal auditory acuity Mouth: No deformity or lesions Lungs: Clear throughout to auscultation Heart: Regular rate and rhythm; no murmurs, rubs or bruits Abdomen: Soft, non tender and non distended. No masses, hepatosplenomegaly or hernias noted. Normal Bowel sounds.  There is a colostomy in the left lower quadrant Rectal:deferred Musculoskeletal: Symmetrical with no gross deformities  Pulses:  Normal pulses noted Extremities: No clubbing, cyanosis, edema or deformities noted Neurological: Alert oriented x 4, grossly nonfocal Psychological:  Alert and cooperative. Normal mood and affect  See Assessment and Plan under Problem List

## 2014-05-18 NOTE — Patient Instructions (Signed)
You have been scheduled for a CT scan of the abdomen and pelvis at [facility] ([address]---this is in the same building as [facility]).   You are scheduled on 05/21/2014 at 1:30p,. You should arrive 15 minutes prior to your appointment time for registration. Please follow the written instructions below on the day of your exam:  WARNING: IF YOU ARE ALLERGIC TO IODINE/X-RAY DYE, PLEASE NOTIFY RADIOLOGY IMMEDIATELY AT [phone]! YOU WILL BE GIVEN A 13 HOUR PREMEDICATION PREP.  1) Do not eat or drink anything after 9:30am (4 hours prior to your test) 2) You have been given 2 bottles of oral contrast to drink. The solution may taste               better if refrigerated, but do NOT add ice or any other liquid to this solution. Shake             well before drinking.    Drink 1 bottle of contrast @ 11:30am (2 hours prior to your exam)  Drink 1 bottle of contrast @ 12:30pm (1 hour prior to your exam)  You may take any medications as prescribed with a small amount of water except for the following: Metformin, Glucophage, Glucovance, Avandamet, Riomet, Fortamet, Actoplus Met, Janumet, Glumetza or Metaglip. The above medications must be held the day of the exam AND 48 hours after the exam.  The purpose of you drinking the oral contrast is to aid in the visualization of your intestinal tract. The contrast solution may cause some diarrhea. Before your exam is started, you will be given a small amount of fluid to drink. Depending on your individual set of symptoms, you may also receive an intravenous injection of x-ray contrast/dye. Plan on being at [facility] for 30 minutes or long, depending on the type of exam you are having performed.  This test typically takes 30-45 minutes to complete.  If you have any questions regarding your exam or if you need to reschedule, you may call the CT department at [phone] between the hours of 8:00 am and 5:00 pm,  Monday-Friday.  ________________________________________________________________________

## 2014-05-19 LAB — HEPATITIS B SURFACE ANTIGEN

## 2014-05-19 LAB — HEPATITIS B SURF AG CONFIRMATION: Hepatitis B Surf Ag Confirmation: POSITIVE — AB

## 2014-05-19 LAB — HEPATITIS A ANTIBODY, TOTAL: Hep A Total Ab: REACTIVE — AB

## 2014-05-19 LAB — HEPATITIS B SURFACE ANTIBODY,QUALITATIVE: Hep B S Ab: POSITIVE — AB

## 2014-05-19 LAB — HEPATITIS B DNA, ULTRAQUANTITATIVE, PCR
Hepatitis B DNA (Calc): >989400000 {copies}/mL
Hepatitis B DNA: >170000000 [IU]/mL — ABNORMAL HIGH

## 2014-05-19 LAB — HEPATITIS C ANTIBODY: HCV Ab: NEGATIVE

## 2014-05-20 ENCOUNTER — Emergency Department (HOSPITAL_COMMUNITY)
Admission: EM | Admit: 2014-05-20 | Discharge: 2014-05-21 | Disposition: A | Discharge status: Home / Self Care | Payer: PRIVATE HEALTH INSURANCE | Attending: Emergency Medicine | Admitting: Emergency Medicine

## 2014-05-20 ENCOUNTER — Encounter (HOSPITAL_COMMUNITY): Payer: Self-pay | Admitting: Emergency Medicine

## 2014-05-20 DIAGNOSIS — I10 Essential (primary) hypertension: Secondary | ICD-10-CM | POA: Diagnosis not present

## 2014-05-20 DIAGNOSIS — I2510 Atherosclerotic heart disease of native coronary artery without angina pectoris: Secondary | ICD-10-CM | POA: Diagnosis not present

## 2014-05-20 DIAGNOSIS — M109 Gout, unspecified: Secondary | ICD-10-CM | POA: Diagnosis not present

## 2014-05-20 DIAGNOSIS — K458 Other specified abdominal hernia without obstruction or gangrene: Secondary | ICD-10-CM | POA: Diagnosis present

## 2014-05-20 DIAGNOSIS — F17200 Nicotine dependence, unspecified, uncomplicated: Secondary | ICD-10-CM | POA: Diagnosis not present

## 2014-05-20 DIAGNOSIS — Z933 Colostomy status: Secondary | ICD-10-CM | POA: Diagnosis not present

## 2014-05-20 DIAGNOSIS — J4489 Other specified chronic obstructive pulmonary disease: Secondary | ICD-10-CM | POA: Insufficient documentation

## 2014-05-20 DIAGNOSIS — J449 Chronic obstructive pulmonary disease, unspecified: Secondary | ICD-10-CM | POA: Diagnosis not present

## 2014-05-20 DIAGNOSIS — F329 Major depressive disorder, single episode, unspecified: Secondary | ICD-10-CM | POA: Insufficient documentation

## 2014-05-20 DIAGNOSIS — Z79899 Other long term (current) drug therapy: Secondary | ICD-10-CM | POA: Insufficient documentation

## 2014-05-20 DIAGNOSIS — IMO0002 Reserved for concepts with insufficient information to code with codable children: Secondary | ICD-10-CM | POA: Insufficient documentation

## 2014-05-20 DIAGNOSIS — F3289 Other specified depressive episodes: Secondary | ICD-10-CM | POA: Insufficient documentation

## 2014-05-20 DIAGNOSIS — Z7901 Long term (current) use of anticoagulants: Secondary | ICD-10-CM | POA: Diagnosis not present

## 2014-05-20 DIAGNOSIS — E785 Hyperlipidemia, unspecified: Secondary | ICD-10-CM | POA: Diagnosis not present

## 2014-05-20 DIAGNOSIS — K439 Ventral hernia without obstruction or gangrene: Secondary | ICD-10-CM

## 2014-05-20 DIAGNOSIS — F411 Generalized anxiety disorder: Secondary | ICD-10-CM | POA: Diagnosis not present
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
## 2014-05-20 NOTE — Discharge Instructions (Signed)
Call for a follow up appointment with a Family or Primary Care Provider.  Called Dr. Grandville Silos for further evaluation of your abdominal hernia. Return if Symptoms worsen.   Take medication as prescribed.

## 2014-05-20 NOTE — ED Notes (Signed)
Bed: MA26 Expected date:  Expected time:  Means of arrival:  Comments: EMS/73 yo male-abnormal mass from colostomy

## 2014-05-20 NOTE — ED Provider Notes (Signed)
CSN: [id number]     Arrival date & time 05/20/14  2035 History   First MD Initiated Contact with Patient 05/20/14 2219     Chief Complaint  Patient presents with  . Hernia    Possible hernia at colostomy site.     (Consider location/radiation/quality/duration/timing/severity/associated sxs/prior Treatment) HPI Comments: The patient is a 73 year old male with past history of colostomy, currently a resident at [facility], presents emergency room chief complaint of abdominal hernia for several days. The patient reports the buldge self resolves. He reports history of a right inguinal hernia repair. Denies nausea or vomiting. The patient reports noticing a normal abdominal. He reports colostomy maintenance was done yesterday.  He reports he is on 2 L of oxygen when needed at baseline, reports chronic wheezing. PCP: Phineas Inches, MD Surgeon: Grandville Silos  The history is provided by the patient. No language interpreter was used.    Past Medical History  Diagnosis Date  . Hypertension   . Coronary artery disease   . Hyperlipemia   . Angina   . Shortness of breath   . Mental disorder   . Arthritis   . COPD (chronic obstructive pulmonary disease)   . Hiatal hernia   . Depression   . Gout   . Joint pain   . Depression   . Anxiety   . Ileus, postoperative 09/29/2013  . PAF (paroxysmal atrial fibrillation) 09/29/2013    On lopressor and Cardizem at home, no anticoagulation.  . S/P colostomy    Past Surgical History  Procedure Laterality Date  . Abdominal surgery    . Laparotomy N/A 09/26/2013    Procedure: EXPLORATORY LAPAROTOMY;  Surgeon: Zenovia Jarred, MD;  Location: [place];  Service: General;  Laterality: N/A;  . Inguinal hernia repair Right 11/08/2013    Procedure: inguinal hernia repair with biologic mesh;  Surgeon: Ralene Ok, MD;  Location: [place];  Service: General;  Laterality: Right;  . Orchiectomy Right 11/08/2013    Procedure: ORCHIECTOMY;  Surgeon: Ralene Ok,  MD;  Location: [place] [place];  Service: General;  Laterality: Right;   No family history on file. History  Substance Use Topics  . Smoking status: Current Every Day Smoker -- 1.00 packs/day for 15 years    Types: Cigarettes  . Smokeless tobacco: Former Systems developer  . Alcohol Use: 7.2 oz/week    12 Cans of beer per week     Comment: Pt reports has not had a drink in 3 months, states used to be heavy drinker    Review of Systems  Constitutional: Negative for fever and chills.  Gastrointestinal: Negative for nausea, vomiting and abdominal pain.      Allergies  Review of patient's allergies indicates no known allergies.  Home Medications   Prior to Admission medications   Medication Sig Start Date End Date Taking? Authorizing Provider  acetaminophen (TYLENOL) 325 MG tablet Take 650 mg by mouth every 6 (six) hours as needed.    Historical Provider, MD  albuterol (PROVENTIL HFA;VENTOLIN HFA) 108 (90 BASE) MCG/ACT inhaler Inhale 2 puffs into the lungs every 6 (six) hours as needed for wheezing.    Historical Provider, MD  carbamide peroxide (DEBROX) 6.5 % otic solution Place 5 drops into the left ear 2 (two) times daily. 01/26/14   Noland Fordyce, PA-C  Colchicine 0.6 MG CAPS Take 0.6 mg by mouth every other day.     Historical Provider, MD  diltiazem (CARDIZEM CD) 120 MG 24 hr capsule Take 120 mg by mouth every  morning.  12/21/13   Historical Provider, MD  Fluticasone-Salmeterol (ADVAIR) 250-50 MCG/DOSE AEPB Inhale 1 puff into the lungs every 12 (twelve) hours.    Historical Provider, MD  gabapentin (NEURONTIN) 100 MG capsule Take 100 mg by mouth 3 (three) times daily.    Historical Provider, MD  ipratropium-albuterol (DUONEB) 0.5-2.5 (3) MG/3ML SOLN Take 3 mLs by nebulization every 6 (six) hours as needed (wheezing, shortness of breath).    Historical Provider, MD  lamoTRIgine (LAMICTAL) 25 MG tablet Take 50 mg by mouth at bedtime.    Historical Provider, MD  metoprolol succinate (TOPROL-XL) 25 MG 24 hr  tablet Take 1 tablet (25 mg total) by mouth every evening. 10/06/13   Emina Riebock, NP  nicotine (NICODERM CQ - DOSED IN MG/24 HOURS) 14 mg/24hr patch Place 14 mg onto the skin daily.    Historical Provider, MD  pantoprazole (PROTONIX) 40 MG tablet Take 40 mg by mouth daily.    Historical Provider, MD  traZODone (DESYREL) 100 MG tablet Take 100 mg by mouth at bedtime.    Historical Provider, MD  Vitamin D, Ergocalciferol, (DRISDOL) 50000 UNITS CAPS capsule Take 50,000 Units by mouth every 30 (thirty) days.    Historical Provider, MD  warfarin (COUMADIN) 1 MG tablet Take 0.5 mg by mouth daily. Take with 5mg  tablet for a total of 5.5mg .    Historical Provider, MD  warfarin (COUMADIN) 5 MG tablet Take 5 mg by mouth daily. Take with 0.5 mg for a total of 5.5mg  daily    Historical Provider, MD   BP 115/55  Pulse 56  Temp(Src) 98.2 F (36.8 C) (Oral)  Resp 18  SpO2 93% Physical Exam  Nursing note and vitals reviewed. Constitutional: He is oriented to person, place, and time. He appears well-developed and well-nourished.  Non-toxic appearance. He does not have a sickly appearance. He does not appear ill. No distress.  HENT:  Head: Normocephalic and atraumatic.  Neck: Normal range of motion. Neck supple.  Cardiovascular: Normal rate and regular rhythm.   Pulmonary/Chest: Effort normal. No respiratory distress. He has wheezes. He has no rales.  Abdominal: Soft. Bowel sounds are normal. He exhibits mass. He exhibits no distension. There is no tenderness. There is no rebound and no guarding.  Large well healed midline incision, no signs of drainage.  Multiple well healed surgical scars. Left lower quadrant with ostomy site, brown stool and ostomy bag. Stoma is red-pink. Epigastric mass on cough, reducible nontender.  Musculoskeletal: Normal range of motion.  Neurological: He is alert and oriented to person, place, and time.  Skin: Skin is warm and dry. He is not diaphoretic.  Psychiatric: He has a  normal mood and affect. His behavior is normal.    ED Course  Procedures (including critical care time) Labs Review Labs Reviewed - No data to display  Imaging Review No results found.   EKG Interpretation None      MDM   Final diagnoses:  Abdominal wall hernia  History of colostomy   Patient presents with reproducible hernia, ostomy site pink no obvious herniation into the ostomy bag. Will discharge home with followup with Gen. Surgery.  Discussed incarcerated and strangulated, and obstructed signs and symptoms with the patient. The patient was also evaluated by Dr. Alvino Chapel during this encounter.      Lorrine Kin, PA-C 05/22/14 [phone]

## 2014-05-21 ENCOUNTER — Ambulatory Visit (INDEPENDENT_AMBULATORY_CARE_PROVIDER_SITE_OTHER)
Admission: RE | Admit: 2014-05-21 | Discharge: 2014-05-21 | Disposition: A | Discharge status: Home / Self Care | Payer: PRIVATE HEALTH INSURANCE | Source: Ambulatory Visit | Attending: Gastroenterology | Admitting: Gastroenterology

## 2014-05-21 DIAGNOSIS — R945 Abnormal results of liver function studies: Principal | ICD-10-CM

## 2014-05-21 DIAGNOSIS — R7989 Other specified abnormal findings of blood chemistry: Secondary | ICD-10-CM

## 2014-05-21 MED ORDER — IOHEXOL 300 MG/ML  SOLN
100.0000 mL | Freq: Once | INTRAMUSCULAR | Status: AC | PRN
  Administered 2014-05-21: 100 mL via INTRAVENOUS

## 2014-05-21 NOTE — ED Notes (Signed)
[facility] called for transport.  

## 2014-05-25 ENCOUNTER — Other Ambulatory Visit: Payer: Self-pay

## 2014-05-25 NOTE — ED Provider Notes (Signed)
I saw and evaluated the patient, reviewed the resident's note and I agree with the findings and plan.   EKG Interpretation None     Patient with ventral hernia. Reducible. Will followup with general surgery no obstruction.  Jasper Riling. Alvino Chapel, MD 05/25/14 [phone]

## 2014-05-26 ENCOUNTER — Other Ambulatory Visit: Payer: Self-pay

## 2014-05-26 DIAGNOSIS — C229 Malignant neoplasm of liver, not specified as primary or secondary: Secondary | ICD-10-CM

## 2014-05-26 DIAGNOSIS — K769 Liver disease, unspecified: Secondary | ICD-10-CM

## 2014-05-27 ENCOUNTER — Other Ambulatory Visit: Payer: Self-pay | Admitting: Gastroenterology

## 2014-06-03 ENCOUNTER — Ambulatory Visit (HOSPITAL_COMMUNITY)
Admission: RE | Admit: 2014-06-03 | Discharge: 2014-06-03 | Disposition: A | Discharge status: Home / Self Care | Payer: PRIVATE HEALTH INSURANCE | Source: Ambulatory Visit | Attending: Gastroenterology | Admitting: Gastroenterology

## 2014-06-03 ENCOUNTER — Other Ambulatory Visit: Payer: Self-pay | Admitting: Gastroenterology

## 2014-06-03 DIAGNOSIS — J4489 Other specified chronic obstructive pulmonary disease: Secondary | ICD-10-CM | POA: Insufficient documentation

## 2014-06-03 DIAGNOSIS — F1020 Alcohol dependence, uncomplicated: Secondary | ICD-10-CM | POA: Diagnosis not present

## 2014-06-03 DIAGNOSIS — J449 Chronic obstructive pulmonary disease, unspecified: Secondary | ICD-10-CM | POA: Insufficient documentation

## 2014-06-03 DIAGNOSIS — K7030 Alcoholic cirrhosis of liver without ascites: Secondary | ICD-10-CM | POA: Insufficient documentation

## 2014-06-03 DIAGNOSIS — I517 Cardiomegaly: Secondary | ICD-10-CM | POA: Insufficient documentation

## 2014-06-03 DIAGNOSIS — C229 Malignant neoplasm of liver, not specified as primary or secondary: Secondary | ICD-10-CM

## 2014-06-03 DIAGNOSIS — K769 Liver disease, unspecified: Secondary | ICD-10-CM

## 2014-06-03 DIAGNOSIS — K439 Ventral hernia without obstruction or gangrene: Secondary | ICD-10-CM | POA: Diagnosis not present

## 2014-06-03 DIAGNOSIS — N281 Cyst of kidney, acquired: Secondary | ICD-10-CM | POA: Insufficient documentation

## 2014-06-03 DIAGNOSIS — K8020 Calculus of gallbladder without cholecystitis without obstruction: Secondary | ICD-10-CM | POA: Diagnosis not present

## 2014-06-03 DIAGNOSIS — K449 Diaphragmatic hernia without obstruction or gangrene: Secondary | ICD-10-CM | POA: Insufficient documentation

## 2014-06-15 ENCOUNTER — Non-Acute Institutional Stay (SKILLED_NURSING_FACILITY): Payer: PRIVATE HEALTH INSURANCE | Admitting: Internal Medicine

## 2014-06-15 DIAGNOSIS — I4891 Unspecified atrial fibrillation: Secondary | ICD-10-CM

## 2014-06-15 DIAGNOSIS — I482 Chronic atrial fibrillation, unspecified: Secondary | ICD-10-CM

## 2014-06-15 DIAGNOSIS — I10 Essential (primary) hypertension: Secondary | ICD-10-CM

## 2014-06-15 DIAGNOSIS — I2510 Atherosclerotic heart disease of native coronary artery without angina pectoris: Secondary | ICD-10-CM

## 2014-06-15 DIAGNOSIS — J449 Chronic obstructive pulmonary disease, unspecified: Secondary | ICD-10-CM

## 2014-06-16 NOTE — Progress Notes (Signed)
         PROGRESS NOTE  DATE: 06-15-14  FACILITY: Nursing Home Location: [facility]  LEVEL OF CARE: SNF (31)  Routine Visit  CHIEF COMPLAINT:  Manage atrial fibrillation, COPD and hypertension  HISTORY OF PRESENT ILLNESS:  REASSESSMENT OF ONGOING PROBLEM(S):  ATRIAL FIBRILLATION: the patients atrial fibrillation remains stable.  The patient denies DOE, tachycardia, orthopnea, transient neurological sx, pedal edema, palpitations, & PNDs.  No complications noted from the medications currently being used.  COPD: the COPD remains stable.  Patient  denies sob, cough, wheezing or declining exercise tolerance.  No complications from the medications presently being used.  HTN: Pt 's HTN remains stable.  The patient Denies CP, sob, DOE, pedal edema, headaches, dizziness or visual disturbances.  No complications from the medications currently being used.  Last BP : 106/60, 122/68, 134/64, 119/62, 136/68, 140/66, 106/66  PAST MEDICAL HISTORY : Reviewed.  No changes.  CURRENT MEDICATIONS: Reviewed per [facility]  REVIEW OF SYSTEMS:  GEN: No weakness, no weight loss RESPIRATORY: Denies shortness of breath, cough or wheezing CARDIAC: Denies chest pains or palpitations GI: Denies abdominal pain, nausea, vomiting, diarrhea or constipation   PHYSICAL EXAMINATION  VS:  See vital sign section  GENERAL: no acute distress, normal body habitus EYES: conjunctivae normal, sclerae normal, normal eye lids NECK: supple, trachea midline, no neck masses, no thyroid tenderness, no thyromegaly LYMPHATICS: no LAN in the neck, no supraclavicular LAN RESPIRATORY: breathing is even & unlabored, BS CTAB CARDIAC: Heart rate is irregularly irregular, no murmur,no extra heart sounds, no edema GI: abdomen soft, normal BS, no masses, no tenderness, no hepatomegaly, no splenomegaly PSYCHIATRIC: the patient is alert & disoriented, affect & behavior appropriate  LABS/RADIOLOGY: 4-15 CBC normal, AST 73,  ALT 68 otherwise CMP normal 1-15 hemoglobin 8.9, MCV 82 otherwise CBC normal, total protein 5.8, albumin is 2.8 otherwise CMP normal, ferritin 56, TIBC 308, serum iron level 28, percent saturation 9, vitamin B12 level 72, folate 17.1  11-14 potassium 4.4, hemoglobin 9.2, hemoglobin 8.9, MCV 83, platelets 465, WBC 8.3, BMP normal  9-14 albumin 3.3, AST 51 otherwise liver profile normal, vitamin B12 level 606, folate 15  8-14 WBC 9.6, hemoglobin 11.4, MCV 98, platelets 434, creatinine 2.07 otherwise BMP normal  ASSESSMENT/PLAN:  COPD-well compensated Atrial fibrillation-rate controlled hypertension-blood pressure borderline. Will monitor.  CAD-stable GERD-stable Elevated liver functions-improved  CPT CODE: 96222  Edgar Frisk. [name], [place] [phone]

## 2014-07-03 ENCOUNTER — Encounter: Payer: Self-pay | Admitting: Gastroenterology

## 2014-07-03 ENCOUNTER — Ambulatory Visit (INDEPENDENT_AMBULATORY_CARE_PROVIDER_SITE_OTHER): Payer: PRIVATE HEALTH INSURANCE | Admitting: Gastroenterology

## 2014-07-03 VITALS — BP 130/72 | HR 60 | Ht 66.0 in | Wt 170.0 lb

## 2014-07-03 DIAGNOSIS — K7030 Alcoholic cirrhosis of liver without ascites: Secondary | ICD-10-CM

## 2014-07-03 DIAGNOSIS — C228 Malignant neoplasm of liver, primary, unspecified as to type: Secondary | ICD-10-CM

## 2014-07-03 DIAGNOSIS — K769 Liver disease, unspecified: Secondary | ICD-10-CM

## 2014-07-03 DIAGNOSIS — Z8505 Personal history of malignant neoplasm of liver: Secondary | ICD-10-CM

## 2014-07-03 DIAGNOSIS — Z9889 Other specified postprocedural states: Secondary | ICD-10-CM

## 2014-07-03 DIAGNOSIS — R160 Hepatomegaly, not elsewhere classified: Secondary | ICD-10-CM

## 2014-07-03 DIAGNOSIS — Z9049 Acquired absence of other specified parts of digestive tract: Secondary | ICD-10-CM

## 2014-07-03 DIAGNOSIS — B1910 Unspecified viral hepatitis B without hepatic coma: Secondary | ICD-10-CM

## 2014-07-03 DIAGNOSIS — C220 Liver cell carcinoma: Secondary | ICD-10-CM

## 2014-07-03 NOTE — Progress Notes (Signed)
      History of Present Illness:  [name] has returned for followup of a liver mass.  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  He is on Coumadin because of initial fibrillation.  He denies abdominal pain.    Review of Systems: Pertinent positive and negative review of systems were noted in the above HPI section. All other review of systems were otherwise negative.    Current Medications, Allergies, Past Medical History, Past Surgical History, Family History and Social History were reviewed in [place] record  Vital signs were reviewed in today's medical record. Physical Exam: General: Well developed , well nourished, no acute distress t  See Assessment and Plan under Problem List

## 2014-07-03 NOTE — Patient Instructions (Addendum)
We will be contacting you with your appointment at Interventional Radiology as soon as it becomes available

## 2014-07-03 NOTE — Assessment & Plan Note (Signed)
Patient has an unresectable hepatoma.  Plan to refer to interventional radiologist for consideration of local therapy including Y90

## 2014-07-07 ENCOUNTER — Ambulatory Visit
Admission: RE | Admit: 2014-07-07 | Discharge: 2014-07-07 | Disposition: A | Discharge status: Home / Self Care | Payer: PRIVATE HEALTH INSURANCE | Source: Ambulatory Visit | Attending: Gastroenterology | Admitting: Gastroenterology

## 2014-07-07 ENCOUNTER — Other Ambulatory Visit: Payer: Self-pay | Admitting: Emergency Medicine

## 2014-07-07 DIAGNOSIS — Z9049 Acquired absence of other specified parts of digestive tract: Secondary | ICD-10-CM

## 2014-07-07 DIAGNOSIS — C220 Liver cell carcinoma: Secondary | ICD-10-CM

## 2014-07-07 DIAGNOSIS — K7030 Alcoholic cirrhosis of liver without ascites: Secondary | ICD-10-CM

## 2014-07-07 DIAGNOSIS — Z8505 Personal history of malignant neoplasm of liver: Secondary | ICD-10-CM

## 2014-07-07 DIAGNOSIS — B1910 Unspecified viral hepatitis B without hepatic coma: Secondary | ICD-10-CM

## 2014-07-07 DIAGNOSIS — R160 Hepatomegaly, not elsewhere classified: Secondary | ICD-10-CM

## 2014-07-07 DIAGNOSIS — C228 Malignant neoplasm of liver, primary, unspecified as to type: Secondary | ICD-10-CM

## 2014-07-08 LAB — AFP TUMOR MARKER: AFP-Tumor Marker: 9.7 ng/mL — ABNORMAL HIGH (ref 0.0–8.0)

## 2014-07-10 ENCOUNTER — Telehealth: Payer: Self-pay | Admitting: *Deleted

## 2014-07-10 NOTE — Telephone Encounter (Signed)
[facility] [phone]). And spoke with ward clerk, Pam.  Patient is on [facility] section of the facility.  Gave appointment date and time with Dr. Benay Spice for 08/05/14 to [facility].  She confirmed and stated that the patient's family would be notified of appointment.  Contact names, numbers, and directions for [facility] were provided.

## 2014-07-20 ENCOUNTER — Non-Acute Institutional Stay (SKILLED_NURSING_FACILITY): Payer: PRIVATE HEALTH INSURANCE | Admitting: Internal Medicine

## 2014-07-20 DIAGNOSIS — J449 Chronic obstructive pulmonary disease, unspecified: Secondary | ICD-10-CM

## 2014-07-20 DIAGNOSIS — I4891 Unspecified atrial fibrillation: Secondary | ICD-10-CM

## 2014-07-20 DIAGNOSIS — I10 Essential (primary) hypertension: Secondary | ICD-10-CM

## 2014-07-20 DIAGNOSIS — J4489 Other specified chronic obstructive pulmonary disease: Secondary | ICD-10-CM

## 2014-07-20 DIAGNOSIS — I482 Chronic atrial fibrillation, unspecified: Secondary | ICD-10-CM

## 2014-07-20 DIAGNOSIS — I2510 Atherosclerotic heart disease of native coronary artery without angina pectoris: Secondary | ICD-10-CM

## 2014-07-21 NOTE — Progress Notes (Signed)
         PROGRESS NOTE  DATE: 07-20-14  FACILITY: Nursing Home Location: [facility]  LEVEL OF CARE: SNF (31)  Routine Visit  CHIEF COMPLAINT:  Manage atrial fibrillation, COPD and hypertension  HISTORY OF PRESENT ILLNESS:  REASSESSMENT OF ONGOING PROBLEM(S):  ATRIAL FIBRILLATION: the patients atrial fibrillation remains stable.  The patient denies DOE, tachycardia, orthopnea, transient neurological sx, pedal edema, palpitations, & PNDs.  No complications noted from the medications currently being used.  COPD: the COPD remains stable.  Patient  denies sob, cough, wheezing or declining exercise tolerance.  No complications from the medications presently being used.  HTN: Pt 's HTN remains stable.  The patient Denies CP, sob, DOE, pedal edema, headaches, dizziness or visual disturbances.  No complications from the medications currently being used.  Last BP : 106/60, 122/68, 134/64, 119/62, 136/68, 140/66, 106/66, 134/76  PAST MEDICAL HISTORY : Reviewed.  No changes.  CURRENT MEDICATIONS: Reviewed per [facility]  REVIEW OF SYSTEMS:  GEN: No weakness, no weight loss RESPIRATORY: Denies shortness of breath, cough or wheezing CARDIAC: Denies chest pains or palpitations GI: Denies abdominal pain, nausea, vomiting, diarrhea or constipation   PHYSICAL EXAMINATION  VS:  See vital sign section  GENERAL: no acute distress, normal body habitus EYES: conjunctivae normal, sclerae normal, normal eye lids NECK: supple, trachea midline, no neck masses, no thyroid tenderness, no thyromegaly LYMPHATICS: no LAN in the neck, no supraclavicular LAN RESPIRATORY: breathing is even & unlabored, BS CTAB CARDIAC: Heart rate is irregularly irregular, no murmur,no extra heart sounds, no edema GI: abdomen soft, normal BS, no masses, no tenderness, no hepatomegaly, no splenomegaly, colostomy present, abdomen distended PSYCHIATRIC: the patient is alert & disoriented, affect & behavior  appropriate  LABS/RADIOLOGY: 7-15 CBC normal, creatinine 1.37, albumin 3.3, he is 248 otherwise CMP normal, HDL 37 otherwise fasting lipid panel normal, hemoglobin A1c 5.7, TSH 0.805 4-15 CBC normal, AST 73, ALT 68 otherwise CMP normal 1-15 hemoglobin 8.9, MCV 82 otherwise CBC normal, total protein 5.8, albumin is 2.8 otherwise CMP normal, ferritin 56, TIBC 308, serum iron level 28, percent saturation 9, vitamin B12 level 72, folate 17.1  11-14 potassium 4.4, hemoglobin 9.2, hemoglobin 8.9, MCV 83, platelets 465, WBC 8.3, BMP normal  9-14 albumin 3.3, AST 51 otherwise liver profile normal, vitamin B12 level 606, folate 15  8-14 WBC 9.6, hemoglobin 11.4, MCV 98, platelets 434, creatinine 2.07 otherwise BMP normal  ASSESSMENT/PLAN:  COPD-well compensated Atrial fibrillation-rate controlled hypertension-well controlled CAD-stable GERD-stable  CPT CODE: 28786  Edgar Frisk. [name], [place] [phone]

## 2014-07-27 ENCOUNTER — Encounter: Payer: Self-pay | Admitting: *Deleted

## 2014-07-28 ENCOUNTER — Non-Acute Institutional Stay (SKILLED_NURSING_FACILITY): Payer: PRIVATE HEALTH INSURANCE | Admitting: Internal Medicine

## 2014-07-28 DIAGNOSIS — H00019 Hordeolum externum unspecified eye, unspecified eyelid: Secondary | ICD-10-CM

## 2014-08-03 NOTE — Progress Notes (Addendum)
Patient ID: Zachary [name], male   DOB: 1940/12/10, 73 y.o.   MRN: [id number]               PROGRESS NOTE  DATE:  07/28/2014      FACILITY: [facility]    LEVEL OF CARE:   SNF   Acute Visit   CHIEF COMPLAINT:  Swelling and pain in his right eye.    HISTORY OF PRESENT ILLNESS:  I was asked to look at this patient actually through the administrator of the building.    He has noted some swelling and inflammation of the lower aspect of his right eyelid.  He states he fell asleep in his glasses last night.  I do not know whether this has anything to do with this.    PHYSICAL EXAMINATION:   EYES:  Right eye:  He has a purulent inflammation on the inferior lid.  There is also a considerable degree of conjunctivitis here.  Vision is unaffected.  Extraocular movements are normal.    ASSESSMENT/PLAN:  Hordeolum.  I think there is also associated conjunctivitis.  I am going to give him topical antibiotics as well as warm compresses on this.

## 2014-08-05 ENCOUNTER — Telehealth: Payer: Self-pay | Admitting: Oncology

## 2014-08-05 ENCOUNTER — Ambulatory Visit (HOSPITAL_BASED_OUTPATIENT_CLINIC_OR_DEPARTMENT_OTHER): Payer: PRIVATE HEALTH INSURANCE | Admitting: Oncology

## 2014-08-05 ENCOUNTER — Ambulatory Visit: Payer: PRIVATE HEALTH INSURANCE

## 2014-08-05 ENCOUNTER — Encounter: Payer: Self-pay | Admitting: Oncology

## 2014-08-05 VITALS — BP 158/68 | HR 54 | Temp 98.1°F | Resp 17 | Ht 66.0 in | Wt 170.2 lb

## 2014-08-05 DIAGNOSIS — C228 Malignant neoplasm of liver, primary, unspecified as to type: Secondary | ICD-10-CM

## 2014-08-05 DIAGNOSIS — K7030 Alcoholic cirrhosis of liver without ascites: Secondary | ICD-10-CM

## 2014-08-05 DIAGNOSIS — I4891 Unspecified atrial fibrillation: Secondary | ICD-10-CM

## 2014-08-05 NOTE — Progress Notes (Signed)
Checked in new patient with care giver from home. No finanical issues prior to seeing the dr.
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
## 2014-08-05 NOTE — Telephone Encounter (Signed)
gv adn printed appt sched and avs for pt for NOV and pt sched to see Dr. Grandville Silos 10.7 @ 10:30am

## 2014-08-17 ENCOUNTER — Non-Acute Institutional Stay (SKILLED_NURSING_FACILITY): Payer: PRIVATE HEALTH INSURANCE | Admitting: Internal Medicine

## 2014-08-17 DIAGNOSIS — I2510 Atherosclerotic heart disease of native coronary artery without angina pectoris: Secondary | ICD-10-CM

## 2014-08-17 DIAGNOSIS — I10 Essential (primary) hypertension: Secondary | ICD-10-CM

## 2014-08-17 DIAGNOSIS — I482 Chronic atrial fibrillation, unspecified: Secondary | ICD-10-CM

## 2014-08-17 DIAGNOSIS — J449 Chronic obstructive pulmonary disease, unspecified: Secondary | ICD-10-CM

## 2014-08-17 DIAGNOSIS — I4891 Unspecified atrial fibrillation: Secondary | ICD-10-CM

## 2014-08-17 NOTE — Progress Notes (Signed)
         PROGRESS NOTE  DATE: 08-17-14  FACILITY: Nursing Home Location: [facility]  LEVEL OF CARE: SNF (31)  Routine Visit  CHIEF COMPLAINT:  Manage atrial fibrillation, COPD and hypertension  HISTORY OF PRESENT ILLNESS:  REASSESSMENT OF ONGOING PROBLEM(S):  ATRIAL FIBRILLATION: the patients atrial fibrillation remains stable.  The patient denies DOE, tachycardia, orthopnea, transient neurological sx, pedal edema, palpitations, & PNDs.  No complications noted from the medications currently being used.  COPD: the COPD remains stable.  Patient  denies sob, cough, wheezing or declining exercise tolerance.  No complications from the medications presently being used.  HTN: Pt 's HTN remains stable.  The patient Denies CP, sob, DOE, pedal edema, headaches, dizziness or visual disturbances.  No complications from the medications currently being used.  Last BP : 106/60, 122/68, 134/64, 119/62, 136/68, 140/66, 106/66, 134/76, 146/84  PAST MEDICAL HISTORY : Reviewed.  No changes.  CURRENT MEDICATIONS: Reviewed per [facility]  REVIEW OF SYSTEMS:  GEN: No weakness, no weight loss RESPIRATORY: Denies shortness of breath, cough or wheezing CARDIAC: Denies chest pains or palpitations GI: Denies abdominal pain, nausea, vomiting, diarrhea or constipation   PHYSICAL EXAMINATION  VS:  See vital sign section  GENERAL: no acute distress, normal body habitus EYES: conjunctivae normal, sclerae normal, normal eye lids NECK: supple, trachea midline, no neck masses, no thyroid tenderness, no thyromegaly LYMPHATICS: no LAN in the neck, no supraclavicular LAN RESPIRATORY: breathing is even & unlabored, BS CTAB CARDIAC: Heart rate is irregularly irregular, no murmur,no extra heart sounds, no edema GI: abdomen soft, normal BS, no masses, no tenderness, no hepatomegaly, no splenomegaly, colostomy present, abdomen distended PSYCHIATRIC: the patient is alert & disoriented, affect & behavior  appropriate  LABS/RADIOLOGY: 7-15 CBC normal, creatinine 1.37, albumin 3.3, he is 248 otherwise CMP normal, HDL 37 otherwise fasting lipid panel normal, hemoglobin A1c 5.7, TSH 0.805 4-15 CBC normal, AST 73, ALT 68 otherwise CMP normal 1-15 hemoglobin 8.9, MCV 82 otherwise CBC normal, total protein 5.8, albumin is 2.8 otherwise CMP normal, ferritin 56, TIBC 308, serum iron level 28, percent saturation 9, vitamin B12 level 72, folate 17.1  11-14 potassium 4.4, hemoglobin 9.2, hemoglobin 8.9, MCV 83, platelets 465, WBC 8.3, BMP normal  9-14 albumin 3.3, AST 51 otherwise liver profile normal, vitamin B12 level 606, folate 15  8-14 WBC 9.6, hemoglobin 11.4, MCV 98, platelets 434, creatinine 2.07 otherwise BMP normal  ASSESSMENT/PLAN:  COPD-well compensated Atrial fibrillation-rate controlled hypertension-well controlled CAD-stable GERD-stable Constipation-continue laxatives Insomnia-on trazodone  CPT CODE: 97989  Edgar Frisk. [name], [place] [phone]

## 2014-09-03 ENCOUNTER — Encounter: Payer: Self-pay | Admitting: *Deleted

## 2014-09-07 ENCOUNTER — Other Ambulatory Visit: Payer: Self-pay | Admitting: *Deleted

## 2014-09-07 MED ORDER — TRAMADOL HCL 50 MG PO TABS: 50.0000 mg | ORAL_TABLET | Freq: Four times a day (QID) | ORAL | Status: AC | PRN

## 2014-09-07 NOTE — Telephone Encounter (Signed)
[facility] 

## 2014-09-09 ENCOUNTER — Ambulatory Visit: Payer: Self-pay | Admitting: Gastroenterology

## 2014-09-10 ENCOUNTER — Ambulatory Visit (INDEPENDENT_AMBULATORY_CARE_PROVIDER_SITE_OTHER): Payer: PRIVATE HEALTH INSURANCE | Admitting: Physician Assistant

## 2014-09-10 ENCOUNTER — Encounter: Payer: Self-pay | Admitting: Physician Assistant

## 2014-09-10 ENCOUNTER — Other Ambulatory Visit (INDEPENDENT_AMBULATORY_CARE_PROVIDER_SITE_OTHER): Payer: PRIVATE HEALTH INSURANCE

## 2014-09-10 VITALS — BP 136/60 | HR 60 | Ht 66.0 in | Wt 148.2 lb

## 2014-09-10 DIAGNOSIS — C220 Liver cell carcinoma: Secondary | ICD-10-CM

## 2014-09-10 DIAGNOSIS — R1084 Generalized abdominal pain: Secondary | ICD-10-CM

## 2014-09-10 DIAGNOSIS — R1900 Intra-abdominal and pelvic swelling, mass and lump, unspecified site: Secondary | ICD-10-CM

## 2014-09-10 LAB — COMPREHENSIVE METABOLIC PANEL WITH GFR
ALT: 41 U/L (ref 0–53)
AST: 46 U/L — ABNORMAL HIGH (ref 0–37)
Albumin: 2.9 g/dL — ABNORMAL LOW (ref 3.5–5.2)
Alkaline Phosphatase: 96 U/L (ref 39–117)
BUN: 10 mg/dL (ref 6–23)
CO2: 31 meq/L (ref 19–32)
Calcium: 9.1 mg/dL (ref 8.4–10.5)
Chloride: 105 meq/L (ref 96–112)
Creatinine, Ser: 1.3 mg/dL (ref 0.4–1.5)
GFR: 68.63 mL/min
Glucose, Bld: 80 mg/dL (ref 70–99)
Potassium: 4 meq/L (ref 3.5–5.1)
Sodium: 141 meq/L (ref 135–145)
Total Bilirubin: 0.6 mg/dL (ref 0.2–1.2)
Total Protein: 7.4 g/dL (ref 6.0–8.3)

## 2014-09-10 LAB — CBC WITH DIFFERENTIAL/PLATELET
BASOS ABS: 0 10*3/uL (ref 0.0–0.1)
Basophils Relative: 0.4 % (ref 0.0–3.0)
Eosinophils Absolute: 0.1 10*3/uL (ref 0.0–0.7)
Eosinophils Relative: 0.8 % (ref 0.0–5.0)
HCT: 43.9 % (ref 39.0–52.0)
Hemoglobin: 14.4 g/dL (ref 13.0–17.0)
LYMPHS PCT: 34.5 % (ref 12.0–46.0)
Lymphs Abs: 2.7 10*3/uL (ref 0.7–4.0)
MCHC: 32.7 g/dL (ref 30.0–36.0)
MCV: 98 fl (ref 78.0–100.0)
MONO ABS: 0.7 10*3/uL (ref 0.1–1.0)
Monocytes Relative: 8.4 % (ref 3.0–12.0)
Neutro Abs: 4.4 10*3/uL (ref 1.4–7.7)
Neutrophils Relative %: 55.9 % (ref 43.0–77.0)
PLATELETS: 216 10*3/uL (ref 150.0–400.0)
RBC: 4.48 Mil/uL (ref 4.22–5.81)
RDW: 14.7 % (ref 11.5–15.5)
WBC: 7.8 10*3/uL (ref 4.0–10.5)

## 2014-09-10 MED ORDER — HYDROCODONE-ACETAMINOPHEN 5-325 MG PO TABS: 1.0000 | ORAL_TABLET | Freq: Four times a day (QID) | ORAL | Status: AC | PRN

## 2014-09-10 NOTE — Patient Instructions (Signed)
We have given you a prescription to take to [facility] for pain, Vicodin 5/325 mg.   We will fax the office note for today to [facility].   You have been scheduled for a CT scan of the abdomen and pelvis at [facility] Radiology department. Go to the front door of [facility], registration desk inside front door of hospital.   You are scheduled on Tuesday 10-13 at 8:30 am . You should arrive  At 8:15 am prior to your appointment time for registration. Please follow the written instructions below on the day of your exam:  WARNING: IF YOU ARE ALLERGIC TO IODINE/X-RAY DYE, PLEASE NOTIFY RADIOLOGY IMMEDIATELY AT [phone]! YOU WILL BE GIVEN A 13 HOUR PREMEDICATION PREP.  1) Do not eat or drink anything after 4:30 am (4 hours prior to your test) 2) You have been given 2 bottles of oral contrast to drink. The solution may taste  better if refrigerated, but do NOT add ice or any other liquid to this solution. Shake  well before drinking.    Drink 1 bottle of contrast @ 6:30 am  (2 hours prior to your exam)  Drink 1 bottle of contrast @ 7:30 am  (1 hour prior to your exam)  You may take any medications as prescribed with a small amount of water except for the following: Metformin, Glucophage, Glucovance, Avandamet, Riomet, Fortamet, Actoplus Met, Janumet, Glumetza or Metaglip. The above medications must be held the day of the exam AND 48 hours after the exam.  The purpose of you drinking the oral contrast is to aid in the visualization of your intestinal tract. The contrast solution may cause some diarrhea. Before your exam is started, you will be given a small amount of fluid to drink. Depending on your individual set of symptoms, you may also receive an intravenous injection of x-ray contrast/dye. Plan on being at [facility] for 30 minutes or long, depending on the type of exam you are having performed.  If you have any questions regarding your exam or if you need to reschedule,  you may call the CT department at [phone] between the hours of 8:00 am and 5:00 pm, Monday-Friday.  ________________________________________________________________________

## 2014-09-10 NOTE — Progress Notes (Signed)
Subjective:    Patient ID: Zachary [name], male    DOB: [birth year], 73 y.o.   MRN: [id number]  HPI  Avian is a 73 year old male known to Dr. 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 He had undergone laparotomy in October of 2014 do to his sigmoid colon perforation from a chicken bone and required sigmoid colectomy and colostomy. CT scan at that time showed an indeterminate lesion in the right lobe of the liver which was later found to be a hepatocellular carcinoma. He has been seen by Dr. Julieanne Manson  for oncology and also evaluated by interventional radiology and is not felt to be a candidate for chemoembolization secondary to the portal vein thrombosis. He was considered for radio ablation embolization but because he has a very poor performance status this has not been done. Last imaging was an MRI in July 2015 which confirmed multifocal areas of T2 hyperintensity in the right lobe of the liver consistent with hepatocellular carcinoma. The lesion in the central right lobe extended into the right portal vein including the portal vein bifurcation and central part of the left portal vein. Also had a prominent head porta hepatis node and a 1.3 cm gastric hepatic node. Patient has history of EtOH-induced cirrhosis and chronic alcoholism. He is now in a nursing home at [facility] and has not been drinking over the past few months. He also has history of hepatitis B, he has severe COPD and is O2 dependent, also with hypertension previous traumatic hemothorax and atrial fibrillation for which he is on Coumadin. He is brought into the office today from the nursing home with complaints of worsening abdominal pain and distention. Patient is a poor historian but says his abdomen feels bigger and swollen over the past month. He does not seem to be aware that he has a liver cancer. He says he has been in but perhaps not as much as he used to, no  nausea or vomiting no fever or chills no melena or hematochezia. He did not know which doctor's office he was at today and does not know who he had seen for oncology.    Review of Systems  HENT: Negative.   Respiratory: Positive for shortness of breath.   Cardiovascular: Negative.   Gastrointestinal: Positive for abdominal pain and abdominal distention.  Endocrine: Negative.   Genitourinary: Negative.   Musculoskeletal: Positive for gait problem.  Allergic/Immunologic: Negative.   Neurological: Positive for weakness.  Hematological: Negative.   Psychiatric/Behavioral: Negative.    Outpatient Prescriptions Prior to Visit  Medication Sig Dispense Refill  . acetaminophen (TYLENOL) 325 MG tablet Take 650 mg by mouth every 6 (six) hours as needed.      Marland Kitchen albuterol (PROVENTIL HFA;VENTOLIN HFA) 108 (90 BASE) MCG/ACT inhaler Inhale 2 puffs into the lungs every 6 (six) hours as needed for wheezing.      . Amino Acids-Protein Hydrolys (FEEDING SUPPLEMENT, PRO-STAT SUGAR FREE 64,) LIQD Take 30 mLs by mouth 2 (two) times daily.      . Colchicine 0.6 MG CAPS Take 0.6 mg by mouth as directed. Every other day as needed for gout flare.      . diltiazem (CARDIZEM CD) 120 MG 24 hr capsule Take 120 mg by mouth every morning.       . docusate sodium (COLACE) 100 MG capsule Take 100 mg by mouth 2 (two) times daily.      . ferrous sulfate 325 (65 FE) MG  tablet Take 325 mg by mouth 2 (two) times daily. For Anemia      . Fluticasone-Salmeterol (ADVAIR) 250-50 MCG/DOSE AEPB Inhale 1 puff into the lungs every 12 (twelve) hours.      . gabapentin (NEURONTIN) 100 MG capsule Take 100 mg by mouth 3 (three) times daily.      . interferon beta-1a (REBIF) 44 MCG/0.5ML injection Inject into the skin 3 (three) times a week. On Monday, Weds, Friday      . ipratropium-albuterol (DUONEB) 0.5-2.5 (3) MG/3ML SOLN Take 3 mLs by nebulization every 6 (six) hours.      Marland Kitchen lamoTRIgine (LAMICTAL) 25 MG tablet Take 75 mg by mouth 2  (two) times daily.       . metoprolol succinate (TOPROL-XL) 25 MG 24 hr tablet Take 1 tablet (25 mg total) by mouth every evening.      Marland Kitchen oxyCODONE (OXY IR/ROXICODONE) 5 MG immediate release tablet Take 5 mg by mouth every 6 (six) hours as needed (for moderate/severe pain).      . pantoprazole (PROTONIX) 40 MG tablet Take 40 mg by mouth daily.      . polyethylene glycol (MIRALAX / GLYCOLAX) packet Mix 17 gm in 4-8 oz of liquid & take by mouth daily      . traMADol (ULTRAM) 50 MG tablet Take 1 tablet (50 mg total) by mouth every 6 (six) hours as needed (for pain).  120 tablet  5  . traZODone (DESYREL) 50 MG tablet Take 1 tablet by mouth every 6 hours as needed for anxiety of sleep      . Vitamin D, Ergocalciferol, (DRISDOL) 50000 UNITS CAPS capsule Take 50,000 Units by mouth every 30 (thirty) days.      Marland Kitchen warfarin (COUMADIN) 2.5 MG tablet 2.5 mg. Take 1 tablet by mouth daily along with Warfarin 2 mg to equal 4.5 mg      . warfarin (COUMADIN) 2 MG tablet Take 2 mg by mouth daily. Take 1 tablet by mouth daily along with the warfarin 2.5 mg to equal 4.5 mg.       No facility-administered medications prior to visit.   No Known Allergies Patient Active Problem List   Diagnosis Date Noted  . Hepatoma 07/03/2014  . Incisional hernia, without obstruction or gangrene 02/04/2014  . Status post right inguinal hernia repair 12/11/2013  . Cramp in limb 12/02/2013  . Anemia of other chronic disease 11/19/2013  . Incarcerated inguinal hernia 11/08/2013  . Right inguinal hernia 11/08/2013  . Coronary atherosclerosis of native coronary artery 11/07/2013  . S/P partial colectomy 10/22/2013  . Inguinal hernia 10/13/2013  . Ileus, postoperative 09/29/2013  . PAF (paroxysmal atrial fibrillation) 09/29/2013  . Respiratory failure, post-operative 09/26/2013  . Perforation of sigmoid colon 09/26/2013  . Atrial fibrillation 08/12/2013  . Severe protein-calorie malnutrition 07/09/2013  . Pneumonia 07/09/2013    . Traumatic hemothorax 07/01/2013  . Acute blood loss anemia 07/01/2013  . Fall 06/23/2013  . Multiple fractures of ribs of left side 06/23/2013  . Alcohol dependence with acute alcoholic intoxication 03/47/4259    Class: Acute  . Alcohol dependence 10/17/2011  . HEPATITIS B 12/01/2008  . HYPERTENSION 12/01/2008  . COPD UNSPECIFIED 12/01/2008  . HEPATIC CIRRHOSIS, ALCOHOLIC 56/38/7564  . ARTHRITIS 12/01/2008  . HERNIA, HX OF 12/01/2008   History  Substance Use Topics  . Smoking status: Current Some Day Smoker -- 1.00 packs/day for 15 years    Types: Cigarettes  . Smokeless tobacco: Former Systems developer  . Alcohol Use: 7.2  oz/week    12 Cans of beer per week     Comment: Pt reports has not had a drink in 3 months, states used to be heavy drinker   family history is not on file.     Objective:   Physical Exam  well-developed older male in no acute distress, sitting in a wheelchair company by an aide from his nursing home. He is on portable oxygen. Blood pressure 136/60 pulse 60 height 5 foot 6 weight 148. HEENT; nontraumatic normocephalic EOMI PERRLA sclera anicteric, Supple ;no JVD, Cardiovascular; regular rate and rhythm from a with S1-S2 no murmur or gallop, Pulmonary; clear bilaterally,  Abdomen;protuberant,multiple incisional scars and colostomy in the left lower quadrant mild generalized tenderness no guarding or rebound no palpable mass no definite fluid wave but suspect ascites, Rectal ;exam not done, Extremities; no clubbing cyanosis or edema skin warm and dry, Psych; affect a bit odd and very poor historian I explained what I thought was causing his pain and 1 minute later he asked me again what is causing his pain.        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 He has multiple comorbidities including history of chronic EtOH abuse, cirrhosis, O2 dependent COPD, hepatitis B, atrial fibrillation for which he is  on Coumadin and probable underlying dementia. He has not and felt to be a surgical candidate and has not been a candidate for chemotherapy embolization. Radio embolization had been considered but do to poor performance status has not been done. Patient now complaining of increased abdominal pain and abdominal distention he may have ascites and/or carcinomatosis. Status post sigmoid colectomy with colostomy, he has incisional hernias as well.  Plan; CBC with differential and CMET Patient did not cooperate well with previous MRI and says he's claustrophobic therefore will proceed with CT of the abdomen and pelvis with contrast to assess for progression  of his cancer. If he has significant ascites he may benefit from therapeutic paracentesis. He has pain medication ordered at [facility] which appears to be on a when necessary basis with tramadol, will give him a prescription for hydrocodone 5/325 to be given on a more regular basis one every 6 hours.

## 2014-09-11 NOTE — Progress Notes (Signed)
Reviewed and agree with management.  Would consider palliative care consult.  We should not be responsible for his primary oncologic care. Sandy Salaam. Deatra Ina, M.D., [facility]

## 2014-09-14 ENCOUNTER — Non-Acute Institutional Stay (SKILLED_NURSING_FACILITY): Payer: PRIVATE HEALTH INSURANCE | Admitting: Internal Medicine

## 2014-09-14 DIAGNOSIS — I482 Chronic atrial fibrillation, unspecified: Secondary | ICD-10-CM

## 2014-09-14 DIAGNOSIS — J438 Other emphysema: Secondary | ICD-10-CM

## 2014-09-14 DIAGNOSIS — I2510 Atherosclerotic heart disease of native coronary artery without angina pectoris: Secondary | ICD-10-CM

## 2014-09-14 DIAGNOSIS — I10 Essential (primary) hypertension: Secondary | ICD-10-CM

## 2014-09-14 NOTE — Progress Notes (Signed)
         PROGRESS NOTE  DATE: 09-14-14  FACILITY: Nursing Home Location: [facility]  LEVEL OF CARE: SNF (31)  Routine Visit  CHIEF COMPLAINT:  Manage atrial fibrillation, COPD and hypertension  HISTORY OF PRESENT ILLNESS:  REASSESSMENT OF ONGOING PROBLEM(S):  ATRIAL FIBRILLATION: the patients atrial fibrillation remains stable.  The patient denies DOE, tachycardia, orthopnea, transient neurological sx, pedal edema, palpitations, & PNDs.  No complications noted from the medications currently being used.  COPD: the COPD remains stable.  Patient  denies sob, cough, wheezing or declining exercise tolerance.  No complications from the medications presently being used.  HTN: Pt 's HTN remains stable.  The patient Denies CP, sob, DOE, pedal edema, headaches, dizziness or visual disturbances.  No complications from the medications currently being used.  Last BP : 106/60, 122/68, 134/64, 119/62, 136/68, 140/66, 106/66, 134/76, 146/84, 136/74  PAST MEDICAL HISTORY : Reviewed.  No changes.  CURRENT MEDICATIONS: Reviewed per [facility]  REVIEW OF SYSTEMS:  GEN: No weakness, no weight loss RESPIRATORY: Denies shortness of breath, cough or wheezing CARDIAC: Denies chest pains or palpitations GI: Denies abdominal pain, nausea, vomiting, diarrhea or constipation   PHYSICAL EXAMINATION  VS:  See vital sign section  GENERAL: no acute distress, normal body habitus EYES: conjunctivae normal, sclerae normal, normal eye lids NECK: supple, trachea midline, no neck masses, no thyroid tenderness, no thyromegaly LYMPHATICS: no LAN in the neck, no supraclavicular LAN RESPIRATORY: breathing is even & unlabored, BS CTAB CARDIAC: Heart rate is irregularly irregular, no murmur,no extra heart sounds, no edema GI: abdomen soft, normal BS, no masses, no tenderness, no hepatomegaly, no splenomegaly, colostomy present PSYCHIATRIC: the patient is alert & disoriented, affect & behavior  appropriate  LABS/RADIOLOGY: 7-15 CBC normal, creatinine 1.37, albumin 3.3, he is 248 otherwise CMP normal, HDL 37 otherwise fasting lipid panel normal, hemoglobin A1c 5.7, TSH 0.805 4-15 CBC normal, AST 73, ALT 68 otherwise CMP normal 1-15 hemoglobin 8.9, MCV 82 otherwise CBC normal, total protein 5.8, albumin is 2.8 otherwise CMP normal, ferritin 56, TIBC 308, serum iron level 28, percent saturation 9, vitamin B12 level 72, folate 17.1  11-14 potassium 4.4, hemoglobin 9.2, hemoglobin 8.9, MCV 83, platelets 465, WBC 8.3, BMP normal  9-14 albumin 3.3, AST 51 otherwise liver profile normal, vitamin B12 level 606, folate 15  8-14 WBC 9.6, hemoglobin 11.4, MCV 98, platelets 434, creatinine 2.07 otherwise BMP normal  ASSESSMENT/PLAN:  COPD-well compensated Atrial fibrillation-rate controlled hypertension-well controlled CAD-stable GERD-stable Constipation-continue laxatives Insomnia-on trazodone  CPT CODE: 26333  Edgar Frisk. [name], [place] [phone]

## 2014-09-15 ENCOUNTER — Ambulatory Visit (HOSPITAL_COMMUNITY)
Admission: RE | Admit: 2014-09-15 | Discharge: 2014-09-15 | Disposition: A | Discharge status: Home / Self Care | Payer: PRIVATE HEALTH INSURANCE | Source: Ambulatory Visit | Attending: Physician Assistant | Admitting: Physician Assistant

## 2014-09-15 ENCOUNTER — Encounter (HOSPITAL_COMMUNITY): Payer: Self-pay

## 2014-09-15 DIAGNOSIS — R1900 Intra-abdominal and pelvic swelling, mass and lump, unspecified site: Secondary | ICD-10-CM

## 2014-09-15 DIAGNOSIS — C220 Liver cell carcinoma: Secondary | ICD-10-CM | POA: Diagnosis present

## 2014-09-15 DIAGNOSIS — R1084 Generalized abdominal pain: Secondary | ICD-10-CM

## 2014-09-15 MED ORDER — IOHEXOL 300 MG/ML  SOLN
100.0000 mL | Freq: Once | INTRAMUSCULAR | Status: AC | PRN
  Administered 2014-09-15: 100 mL via INTRAVENOUS

## 2014-09-22 ENCOUNTER — Non-Acute Institutional Stay (SKILLED_NURSING_FACILITY): Payer: PRIVATE HEALTH INSURANCE | Admitting: Internal Medicine

## 2014-09-22 DIAGNOSIS — M7061 Trochanteric bursitis, right hip: Secondary | ICD-10-CM

## 2014-09-29 ENCOUNTER — Other Ambulatory Visit: Payer: Self-pay

## 2014-09-30 NOTE — Progress Notes (Signed)
Patient ID: Zachary [name], male   DOB: June 10, 1941, 73 y.o.   MRN: [id number]               PROGRESS NOTE  DATE:  09/22/2014    FACILITY: [facility]    LEVEL OF CARE:   SNF   Acute Visit   CHIEF COMPLAINT:  Right hip pain.    HISTORY OF PRESENT ILLNESS:  Zachary [name] stopped me in the hallway to report pain in the right hip sufficient enough for him to have difficulty bearing weight.  This started sometime over the weekend.  Staff initially told me he had a fall, although the patient denies this.  It is clear that he leaves the premises with a walker and walks to a grocery store, which is a longer distance than I would have thought.  Whether he has had a fall or not at this point is unclear.    PHYSICAL EXAMINATION:   GENERAL APPEARANCE:  The patient is not in any distress.   MUSCULOSKELETAL:   EXTREMITIES:   RIGHT LOWER EXTREMITY:  Right hip:  There is tenderness over the greater trochanter.  When the hip is externally rotated, there is also pain in the same area.  However, internal and external rotation are generally well tolerated.    ASSESSMENT/PLAN:  I suspect he has trochanteric bursitis.  I have given him hot packs and five days of Celebrex.  He is not a candidate for acetaminophen routinely due to liver issues/cirrhosis.    I have also x-rayed the hip, although I am doubtful there is a fracture here.     CPT CODE: 62947

## 2014-10-03 ENCOUNTER — Encounter (HOSPITAL_COMMUNITY): Payer: Self-pay | Admitting: Emergency Medicine

## 2014-10-03 ENCOUNTER — Emergency Department (HOSPITAL_COMMUNITY): Payer: PRIVATE HEALTH INSURANCE

## 2014-10-03 ENCOUNTER — Emergency Department (HOSPITAL_COMMUNITY)
Admission: EM | Admit: 2014-10-03 | Discharge: 2014-10-03 | Disposition: A | Discharge status: Home / Self Care | Payer: PRIVATE HEALTH INSURANCE | Attending: Emergency Medicine | Admitting: Emergency Medicine

## 2014-10-03 DIAGNOSIS — F419 Anxiety disorder, unspecified: Secondary | ICD-10-CM | POA: Insufficient documentation

## 2014-10-03 DIAGNOSIS — F329 Major depressive disorder, single episode, unspecified: Secondary | ICD-10-CM | POA: Diagnosis not present

## 2014-10-03 DIAGNOSIS — Z7901 Long term (current) use of anticoagulants: Secondary | ICD-10-CM | POA: Diagnosis not present

## 2014-10-03 DIAGNOSIS — C220 Liver cell carcinoma: Secondary | ICD-10-CM | POA: Insufficient documentation

## 2014-10-03 DIAGNOSIS — Z79899 Other long term (current) drug therapy: Secondary | ICD-10-CM | POA: Diagnosis not present

## 2014-10-03 DIAGNOSIS — Z8639 Personal history of other endocrine, nutritional and metabolic disease: Secondary | ICD-10-CM | POA: Insufficient documentation

## 2014-10-03 DIAGNOSIS — Z720 Tobacco use: Secondary | ICD-10-CM | POA: Insufficient documentation

## 2014-10-03 DIAGNOSIS — M25551 Pain in right hip: Secondary | ICD-10-CM

## 2014-10-03 DIAGNOSIS — Z7952 Long term (current) use of systemic steroids: Secondary | ICD-10-CM | POA: Insufficient documentation

## 2014-10-03 DIAGNOSIS — I10 Essential (primary) hypertension: Secondary | ICD-10-CM | POA: Diagnosis not present

## 2014-10-03 DIAGNOSIS — I2510 Atherosclerotic heart disease of native coronary artery without angina pectoris: Secondary | ICD-10-CM | POA: Insufficient documentation

## 2014-10-03 DIAGNOSIS — J449 Chronic obstructive pulmonary disease, unspecified: Secondary | ICD-10-CM | POA: Insufficient documentation

## 2014-10-03 DIAGNOSIS — I480 Paroxysmal atrial fibrillation: Secondary | ICD-10-CM | POA: Insufficient documentation

## 2014-10-03 DIAGNOSIS — M1990 Unspecified osteoarthritis, unspecified site: Secondary | ICD-10-CM | POA: Insufficient documentation

## 2014-10-03 DIAGNOSIS — Z8719 Personal history of other diseases of the digestive system: Secondary | ICD-10-CM | POA: Insufficient documentation

## 2014-10-03 MED ORDER — HYDROCODONE-ACETAMINOPHEN 5-325 MG PO TABS
1.0000 | ORAL_TABLET | Freq: Once | ORAL | Status: AC
  Administered 2014-10-03: 1 via ORAL
  Filled 2014-10-03: qty 1

## 2014-10-03 NOTE — ED Provider Notes (Signed)
CSN: [id number]     Arrival date & time 10/03/14  1553 History   First MD Initiated Contact with Patient 10/03/14 1617     Chief Complaint  Patient presents with  . Hip Pain    right     (Consider location/radiation/quality/duration/timing/severity/associated sxs/prior Treatment) Patient is a 73 y.o. male presenting with hip pain. The history is provided by the patient.  Hip Pain This is a new problem. Episode onset: 3 days ago. The problem occurs constantly. The problem has not changed since onset.Pertinent negatives include no chest pain, no abdominal pain, no headaches and no shortness of breath. Exacerbated by: movement. Nothing relieves the symptoms. Treatments tried: pt states he got demerol. The treatment provided mild relief.    Past Medical History  Diagnosis Date  . Hypertension   . Coronary artery disease   . Hyperlipemia   . Angina   . Shortness of breath   . Mental disorder   . Arthritis   . COPD (chronic obstructive pulmonary disease)   . Hiatal hernia   . Depression   . Gout   . Joint pain   . Depression   . Anxiety   . Ileus, postoperative 09/29/2013  . PAF (paroxysmal atrial fibrillation) 09/29/2013    On lopressor and Cardizem at home, no anticoagulation.  . S/P colostomy   . Liver disease   . Alcohol abuse    Past Surgical History  Procedure Laterality Date  . Abdominal surgery    . Laparotomy N/A 09/26/2013    Procedure: EXPLORATORY LAPAROTOMY;  Surgeon: Zenovia Jarred, MD;  Location: [facility];  Service: General;  Laterality: N/A;  . Inguinal hernia repair Right 11/08/2013    Procedure: inguinal hernia repair with biologic mesh;  Surgeon: Ralene Ok, MD;  Location: [facility];  Service: General;  Laterality: Right;  . Orchiectomy Right 11/08/2013    Procedure: ORCHIECTOMY;  Surgeon: Ralene Ok, MD;  Location: [facility];  Service: General;  Laterality: Right;  . Colonoscopy  01/08/2010    Dr. Penelope Coop  . Esophagogastroduodenoscopy  01/08/2010    Dr.  Penelope Coop   No family history on file. History  Substance Use Topics  . Smoking status: Current Some Day Smoker -- 1.00 packs/day for 15 years    Types: Cigarettes  . Smokeless tobacco: Former Systems developer  . Alcohol Use: 7.2 oz/week    12 Cans of beer per week     Comment: Pt reports has not had a drink in 3 months, states used to be heavy drinker    Review of Systems  Constitutional: Negative for fever.  HENT: Negative for drooling and rhinorrhea.   Eyes: Negative for pain.  Respiratory: Negative for cough and shortness of breath.   Cardiovascular: Negative for chest pain and leg swelling.  Gastrointestinal: Negative for nausea, vomiting, abdominal pain and diarrhea.  Genitourinary: Negative for dysuria and hematuria.  Musculoskeletal: Negative for gait problem and neck pain.  Skin: Negative for color change.  Neurological: Negative for numbness and headaches.  Hematological: Negative for adenopathy.  Psychiatric/Behavioral: Negative for behavioral problems.  All other systems reviewed and are negative.     Allergies  Review of patient's allergies indicates no known allergies.  Home Medications   Prior to Admission medications   Medication Sig Start Date End Date Taking? Authorizing Provider  acetaminophen (TYLENOL) 325 MG tablet Take 650 mg by mouth every 6 (six) hours as needed.    Historical Provider, MD  albuterol (PROVENTIL HFA;VENTOLIN HFA) 108 (90 BASE) MCG/ACT inhaler  Inhale 2 puffs into the lungs every 6 (six) hours as needed for wheezing.    Historical Provider, MD  Amino Acids-Protein Hydrolys (FEEDING SUPPLEMENT, PRO-STAT SUGAR FREE 64,) LIQD Take 30 mLs by mouth 2 (two) times daily.    Historical Provider, MD  Colchicine 0.6 MG CAPS Take 0.6 mg by mouth as directed. Every other day as needed for gout flare.    Historical Provider, MD  diltiazem (CARDIZEM CD) 120 MG 24 hr capsule Take 120 mg by mouth every morning.  12/21/13   Historical Provider, MD  docusate sodium  (COLACE) 100 MG capsule Take 100 mg by mouth 2 (two) times daily.    Historical Provider, MD  ferrous sulfate 325 (65 FE) MG tablet Take 325 mg by mouth 2 (two) times daily. For Anemia    Historical Provider, MD  Fluticasone-Salmeterol (ADVAIR) 250-50 MCG/DOSE AEPB Inhale 1 puff into the lungs every 12 (twelve) hours.    Historical Provider, MD  gabapentin (NEURONTIN) 100 MG capsule Take 100 mg by mouth 3 (three) times daily.    Historical Provider, MD  HYDROcodone-acetaminophen (NORCO/VICODIN) 5-325 MG per tablet Take 1 tablet by mouth every 6 (six) hours as needed for moderate pain. 09/10/14   Amy S Esterwood, PA-C  interferon beta-1a (REBIF) 44 MCG/0.5ML injection Inject into the skin 3 (three) times a week. On Monday, Weds, Friday    Historical Provider, MD  ipratropium-albuterol (DUONEB) 0.5-2.5 (3) MG/3ML SOLN Take 3 mLs by nebulization every 6 (six) hours.    Historical Provider, MD  lamoTRIgine (LAMICTAL) 25 MG tablet Take 75 mg by mouth 2 (two) times daily.     Historical Provider, MD  metoprolol succinate (TOPROL-XL) 25 MG 24 hr tablet Take 1 tablet (25 mg total) by mouth every evening. 10/06/13   Emina Riebock, NP  oxyCODONE (OXY IR/ROXICODONE) 5 MG immediate release tablet Take 5 mg by mouth every 6 (six) hours as needed (for moderate/severe pain).    Historical Provider, MD  pantoprazole (PROTONIX) 40 MG tablet Take 40 mg by mouth daily.    Historical Provider, MD  polyethylene glycol (MIRALAX / GLYCOLAX) packet Mix 17 gm in 4-8 oz of liquid & take by mouth daily    Historical Provider, MD  traMADol (ULTRAM) 50 MG tablet Take 1 tablet (50 mg total) by mouth every 6 (six) hours as needed (for pain). 09/07/14   Tiffany L Reed, DO  traZODone (DESYREL) 50 MG tablet Take 1 tablet by mouth every 6 hours as needed for anxiety of sleep    Historical Provider, MD  Vitamin D, Ergocalciferol, (DRISDOL) 50000 UNITS CAPS capsule Take 50,000 Units by mouth every 30 (thirty) days.    Historical Provider, MD   warfarin (COUMADIN) 2.5 MG tablet 2.5 mg. Take 1 tablet by mouth daily along with Warfarin 2 mg to equal 4.5 mg    Historical Provider, MD   BP 168/68  Pulse 48  Temp(Src) 98.7 F (37.1 C) (Oral)  Resp 18  SpO2 94% Physical Exam  Nursing note and vitals reviewed. Constitutional: He is oriented to person, place, and time. He appears well-developed and well-nourished.  HENT:  Head: Normocephalic and atraumatic.  Right Ear: External ear normal.  Left Ear: External ear normal.  Nose: Nose normal.  Mouth/Throat: Oropharynx is clear and moist. No oropharyngeal exudate.  Eyes: Conjunctivae and EOM are normal. Pupils are equal, round, and reactive to light.  Neck: Normal range of motion. Neck supple.  Cardiovascular: Normal rate, regular rhythm, normal heart sounds and intact  distal pulses.  Exam reveals no gallop and no friction rub.   No murmur heard. Pulmonary/Chest: Effort normal and breath sounds normal. No respiratory distress. He has no wheezes.  Abdominal: Soft. Bowel sounds are normal. He exhibits no distension. There is no tenderness. There is no rebound and no guarding.  Normal appearing colostomy. Brown stool.   Genitourinary: Penis normal.  Normal appearance of palpation of the inguinal region bilaterally.  Musculoskeletal: Normal range of motion. He exhibits tenderness. He exhibits no edema.  Normal range of motion of the right hip. He did have mild reproduction of symptoms with extension and flexion of the hip. Mild tenderness to palpation of the right lateral hip.  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  Normal appearance of the right hip.  2+ pulses in the bilateral distal lower extremities.  Neurological: He is alert and oriented to person, place, and time.  Skin: Skin is warm and dry.  Psychiatric: He has a normal mood and affect. His behavior is normal.    ED Course  Procedures (including critical care time) Labs Review Labs Reviewed - No data to  display  Imaging Review Dg Hip Complete Right  10/03/2014   CLINICAL DATA:  Right hip pain x3 days.  No injury.  EXAM: RIGHT HIP - COMPLETE 2+ VIEW  COMPARISON:  CT 09/15/2014.  FINDINGS: Degenerative changes lumbar spine and both hips. No evidence of fracture or dislocation. Aortoiliac atherosclerotic vascular disease. Metallic fragment again noted over the right hip. Abdominal hernia again noted.  IMPRESSION: 1. Degenerative changes lumbar spine and both hips.  2.  Peripheral vascular disease.  3.  Abdominal hernia again noted.   Electronically Signed   By: Marcello Moores  Register   On: 10/03/2014 17:22     EKG Interpretation None      MDM   Final diagnoses:  Right hip pain    4:59 PM 73 y.o. 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 He has multiple comorbidities including history of chronic EtOH abuse, cirrhosis, O2 dependent COPD, hepatitis B, atrial fibrillation for which he is on Coumadin and probable underlying dementia. He presents today for right hip pain with occasional shooting pain down to his right foot which began 3 days ago after using his wheelchair to get to the store. He denies any fall or specific injury. He denies any fevers. He is afebrile and mildly bradycardic here. Vital signs otherwise unremarkable. He has normal range of motion of the right hip on exam with mild pain during flexion and extension of the hip. We'll get screening imaging and Norco for pain. Low suspicion for septic joint given the normal range of motion with minimal pain and normal appearance cutaneously of the hip.11   6:17 PM: Pt able to ambulate around the room. Will rec pt continue hydrocodone q 6hr at his facility as recently prescribed by his pcp on office visit 09/11/15.  I have discussed the diagnosis/risks/treatment options with the patient and believe the pt to be eligible for discharge home to follow-up with his pcp next week. We also discussed  returning to the ED immediately if new or worsening sx occur. We discussed the sx which are most concerning (e.g., worsening pain, fever) that necessitate immediate return. Medications administered to the patient during their visit and any new prescriptions provided to the patient are listed below.  Medications given during this visit Medications  HYDROcodone-acetaminophen (NORCO/VICODIN) 5-325 MG per tablet 1 tablet (1 tablet Oral Given 10/03/14  1728)    New Prescriptions   No medications on file     Pamella Pert, MD 10/03/14 4268

## 2014-10-03 NOTE — ED Notes (Signed)
Bed: PQ98 Expected date: 10/03/14 Expected time: 3:39 PM Means of arrival: Ambulance Comments: Hip pain

## 2014-10-03 NOTE — ED Notes (Addendum)
Upon entering room pt on 2 lpm [place]. Per triage note pt on 4 lpm [place] continuously. Facility called and report pt on 2 lpm  continuously.

## 2014-10-03 NOTE — Discharge Instructions (Signed)
The patient was seen and evaluated in the emergency department. He had noncontributory plain films of his right hip. He was given a Norco for pain. I saw that his primary care doctor had recently prescribed him  1 tablet of hydrocodone every 6 hours for pain. I would continue this regimen as long as he is having right hip pain. I would recommend that he follow-up with his primary care doctor for repeat evaluation of the right hip next week. He should return for any worsening pain, fever , or new concerning symptoms.

## 2014-10-03 NOTE — ED Notes (Signed)
Pt was walked around his room once

## 2014-10-03 NOTE — ED Notes (Addendum)
Per EMS (from [facility])-c/o right hip pain radiating to right foot x3 days. No recent injury/trauma. Was "using wheelchair and using feet to move wheelchair." C/o pain after this. Staff reports "patient wants pain medicine constantly-gets pain medicine every 6 hours (Oxycodone and Ultram)." No hip shortening or deformity noted. A&Ox4. Pulses strong. Was able to stand up and transfer to stretcher on scene. On continuous O2 therapy (4L). On Metoprolol and Diltiazem. Has colostomy on left quadrant. Not on blood thinners currently. VS: BP: 151/75 SpO2 97% on RA and on 4L RR 18-20 HR 43. Pain 10/10.

## 2014-10-06 ENCOUNTER — Ambulatory Visit (HOSPITAL_BASED_OUTPATIENT_CLINIC_OR_DEPARTMENT_OTHER): Payer: PRIVATE HEALTH INSURANCE | Admitting: Oncology

## 2014-10-06 ENCOUNTER — Telehealth: Payer: Self-pay | Admitting: Oncology

## 2014-10-06 VITALS — BP 154/54 | HR 50 | Temp 98.4°F | Resp 16 | Ht 66.0 in | Wt 162.8 lb

## 2014-10-06 DIAGNOSIS — C220 Liver cell carcinoma: Secondary | ICD-10-CM

## 2014-10-06 DIAGNOSIS — K7030 Alcoholic cirrhosis of liver without ascites: Secondary | ICD-10-CM

## 2014-10-06 MED ORDER — HYDROCODONE-ACETAMINOPHEN 5-325 MG PO TABS
ORAL_TABLET | ORAL | Status: AC
  Filled 2014-10-06: qty 1

## 2014-10-06 MED ORDER — HYDROCODONE-ACETAMINOPHEN 5-325 MG PO TABS
1.0000 | ORAL_TABLET | Freq: Once | ORAL | Status: AC
  Administered 2014-10-06: 1 via ORAL

## 2014-10-06 NOTE — Progress Notes (Signed)
  [facility] OFFICE PROGRESS NOTE   Diagnosis: hepatocellular carcinoma  INTERVAL HISTORY:   Zachary [name] returns as scheduled. He is not aware of the reason he is here. He complains of right "hip "pain. No trauma. He was seen in the emergency room for evaluation of the pain on 10/03/2014. A plain x-ray revealed degenerative changes at the hip.he remains in a skilled nursing facility.  Objective:  Vital signs in last 24 hours:  Blood pressure 154/54, pulse 50, temperature 98.4 F (36.9 C), temperature source Oral, resp. rate 16, height 5\' 6"  (1.676 m), weight 162 lb 12.8 oz (73.846 kg), SpO2 97 %.    Lymphatics: 1/2 cm left scalene node, no other cervical, supraclavicular, or axillary nodes Resp: distant breath sounds, scattered expiratory rhonchi, no respiratory distress Cardio: regular rate and rhythm GI: no hepatomegaly, nontender, no apparent ascites Vascular: no leg edema Musculoskeletal: Pain with motion at the right hip. The soft tissue overlying the right trochanter appears unremarkable.      Lab Results:  Lab Results  Component Value Date   WBC 7.8 09/10/2014   HGB 14.4 09/10/2014   HCT 43.9 09/10/2014   MCV 98.0 09/10/2014   PLT 216.0 09/10/2014   NEUTROABS 4.4 09/10/2014      Imaging:  Dg Hip Complete Right  10/03/2014   CLINICAL DATA:  Right hip pain x3 days.  No injury.  EXAM: RIGHT HIP - COMPLETE 2+ VIEW  COMPARISON:  CT 09/15/2014.  FINDINGS: Degenerative changes lumbar spine and both hips. No evidence of fracture or dislocation. Aortoiliac atherosclerotic vascular disease. Metallic fragment again noted over the right hip. Abdominal hernia again noted.  IMPRESSION: 1. Degenerative changes lumbar spine and both hips.  2.  Peripheral vascular disease.  3.  Abdominal hernia again noted.   Electronically Signed   By: Marcello Moores  Register   On: 10/03/2014 17:22    Medications: I have reviewed the patient's current  medications.  Assessment/Plan: 1. Hepatocellular carcinoma-clinical presentation, liver MRI, and elevated AFP consistent with this diagnosis  Not a hepatic resection or transplant candidate  2. hepatitis B  3. history of alcohol abuse  4. Cirrhosis  5. COPD-maintained on oxygen  6. colonic perforation secondary to a chicken bone, status post a colectomy/colostomy October 2014  7. incarcerated right inguinal hernia-status post right inguinal hernia repair and right orchiectomy 11/08/2013  8. atrial fibrillation  9.    Right hip discomfort-etiology unclear, this would be an unusual presentation for metastatic hepatocellular carcinoma  Disposition:  Zachary [name] has hepatocellular carcinoma and multiple comorbid conditions.He is not a candidate for hepatic directed therapy or systemic therapy. I recommend comfort care with a [facility] referral.  I think it is unlikely the right hip discomfort is related to hepatocellular carcinoma. He can continue further evaluation with his primary physician.  Mr. Conigliaro is not scheduled for a followup appointment at the [facility]. We will be glad to see him in the future as needed. We gave him a hydrocodone tablet for the right hip pain.  Betsy Coder, MD  10/06/2014  12:33 PM

## 2014-10-06 NOTE — Telephone Encounter (Signed)
Per 11/03 POF f/u TBA..... KJ

## 2014-10-29 IMAGING — CR DG CHEST 2V
2 series · 2 of 2 positions shown · non-contrast
Comparison: 11/24/2012.

CLINICAL DATA: Chest pain, shortness of breath, cough, chest
congestion.

CHEST - 2 VIEW

[w chest pa]
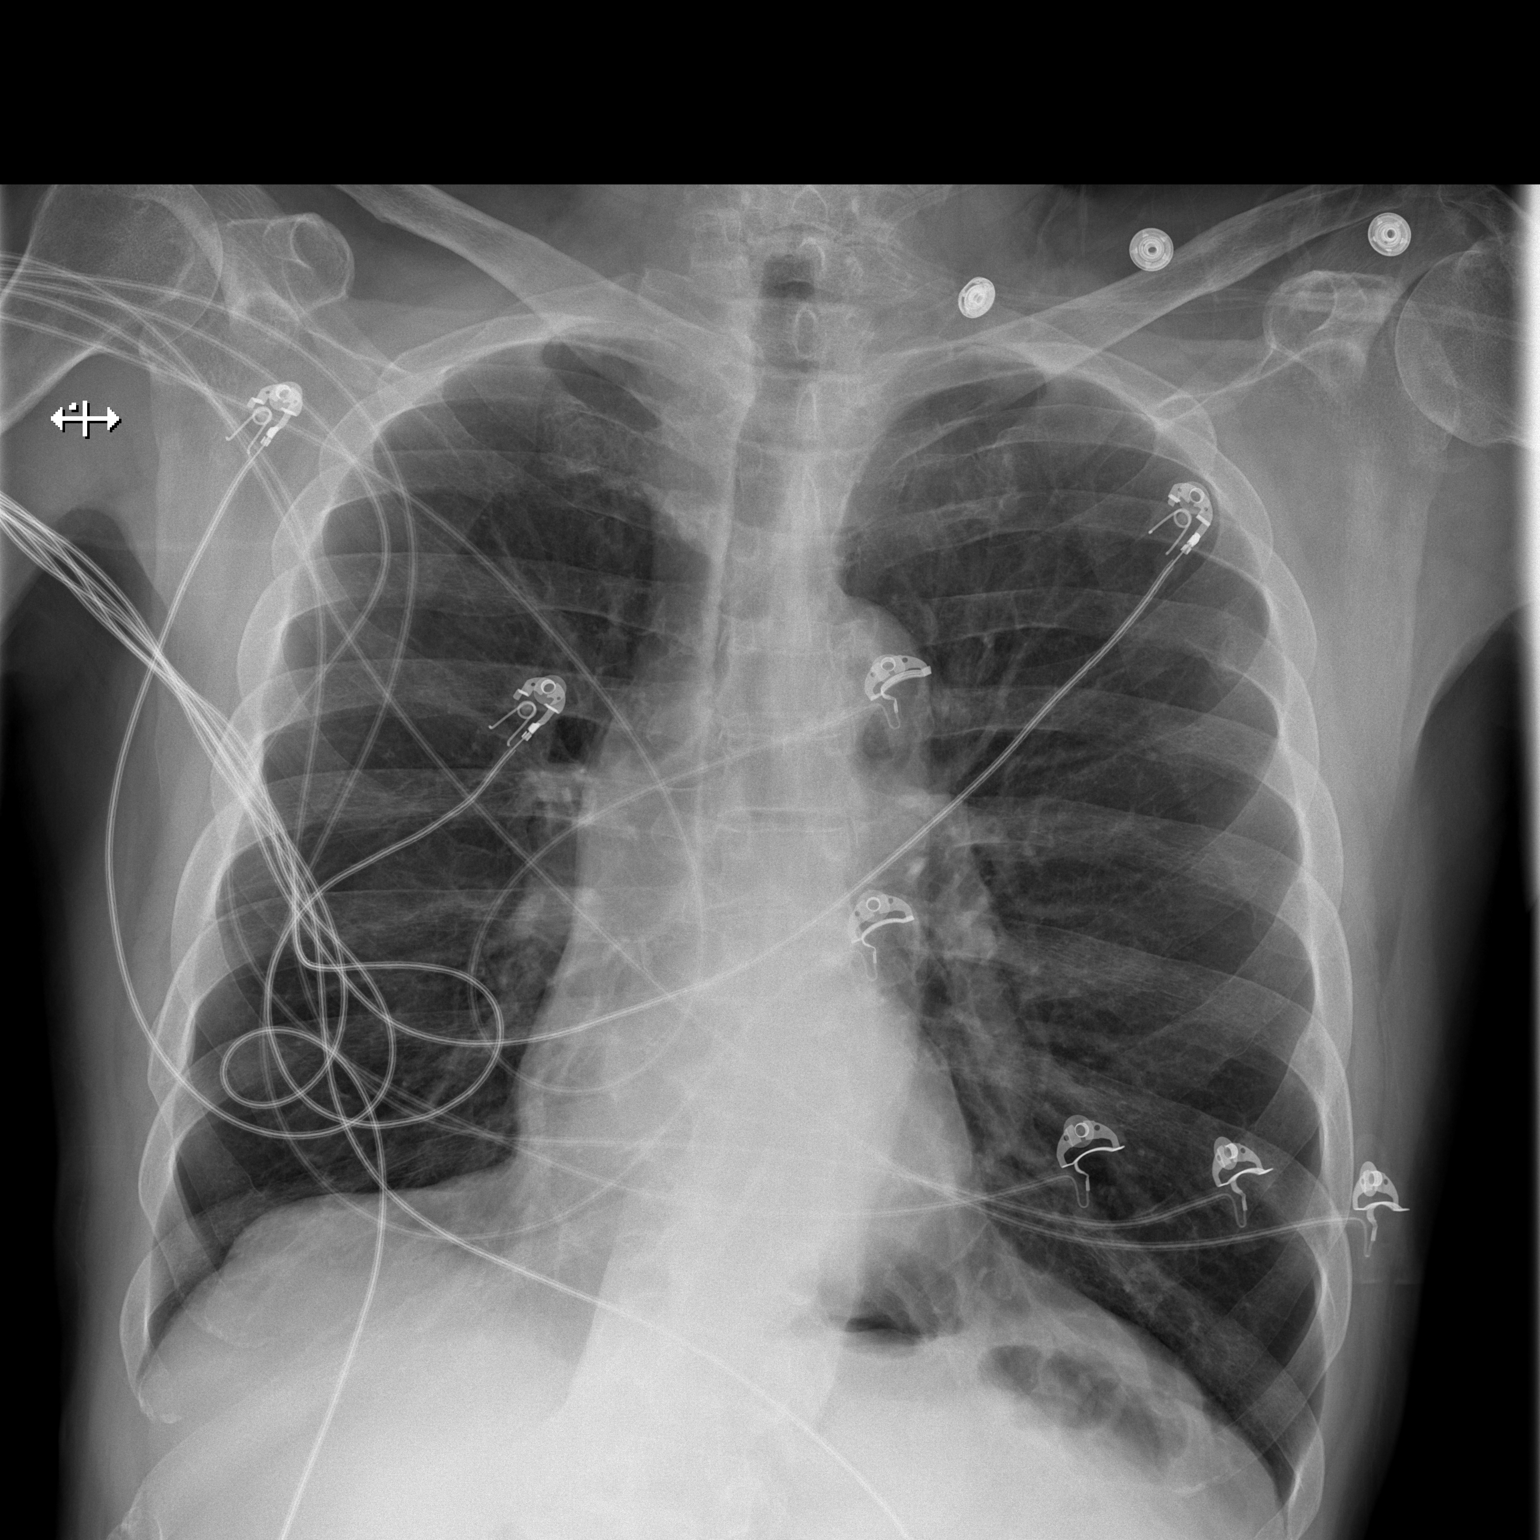

[w chest lat]
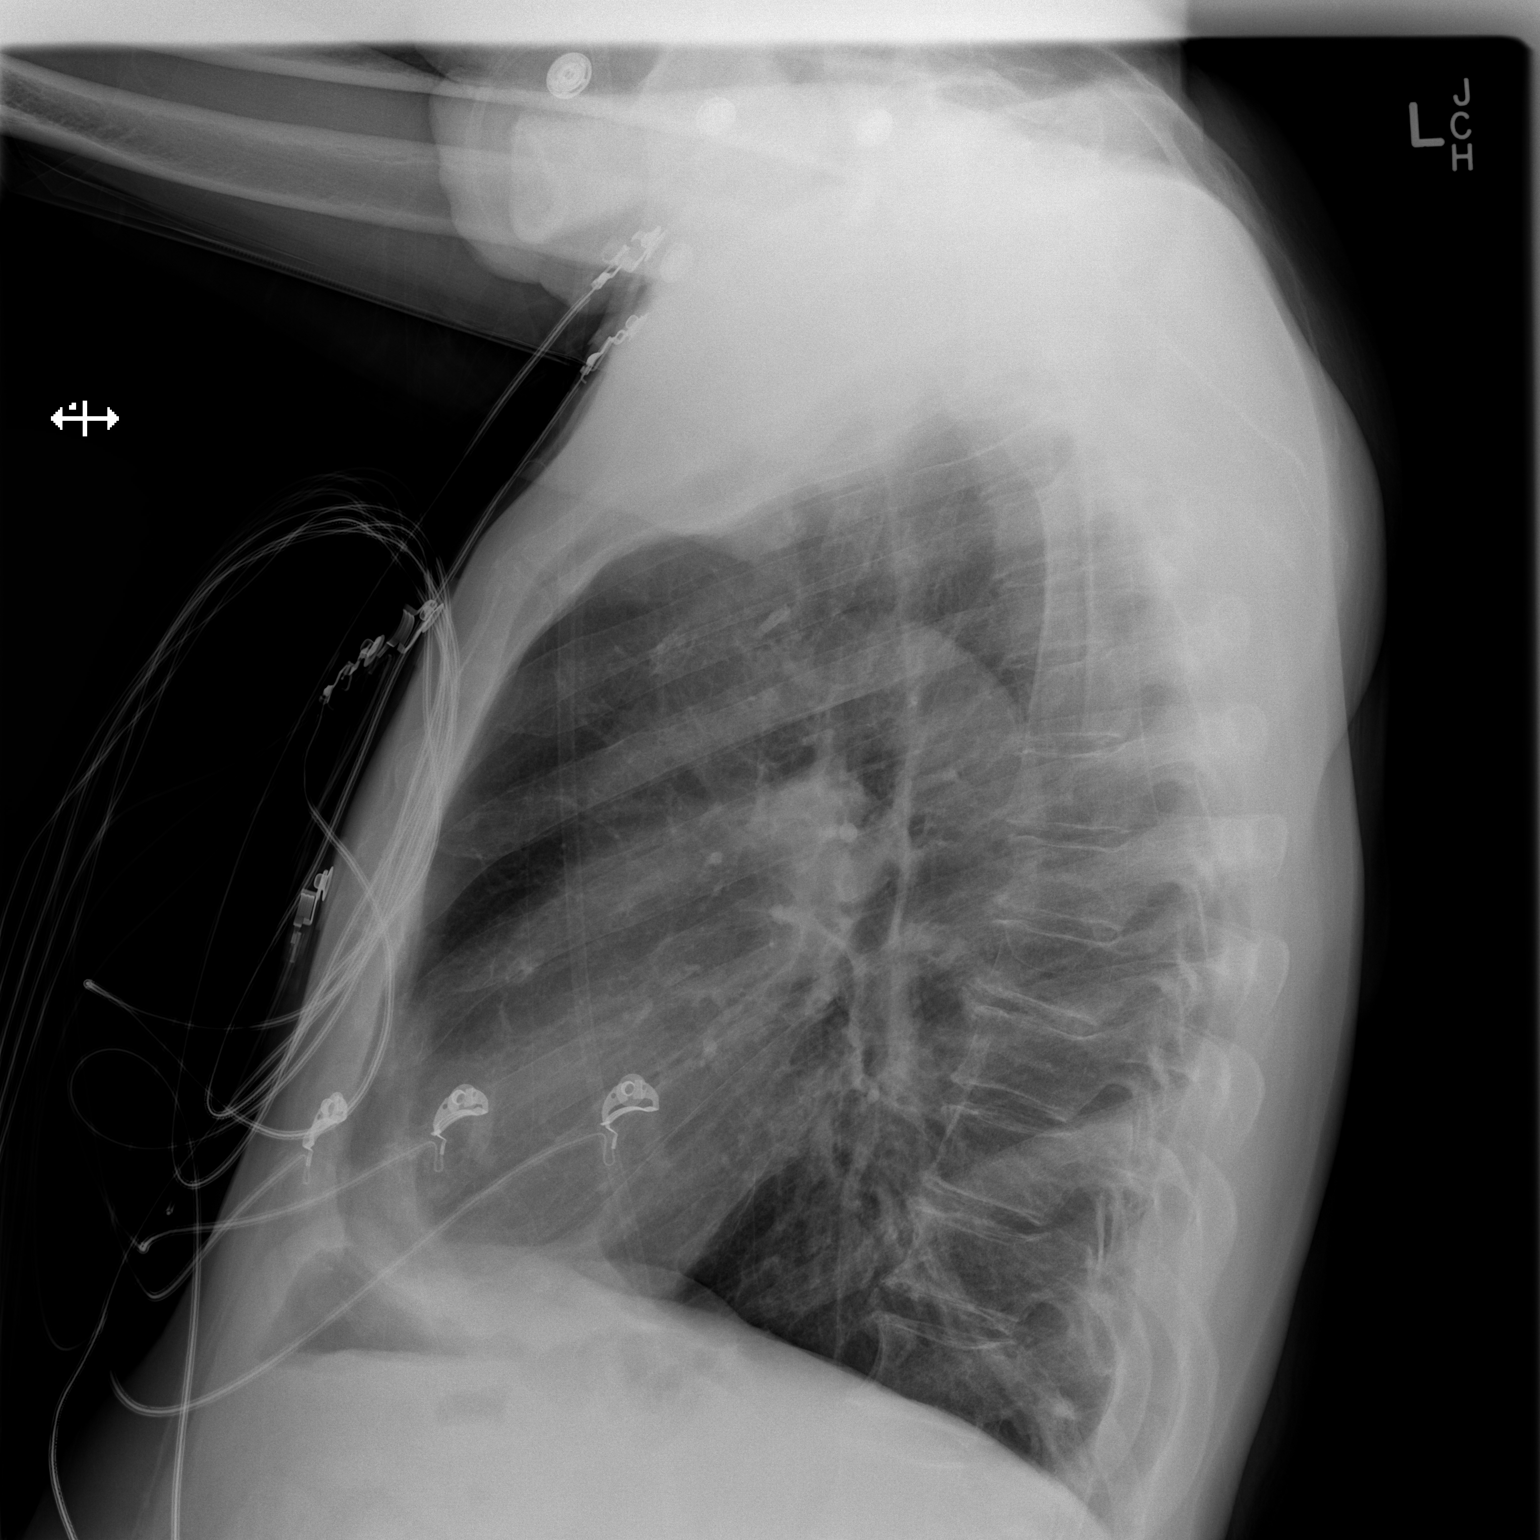

[2 of 2 positions shown; findings below may reference images not displayed]

FINDINGS: Normal sized heart.  Clear lungs.  The lungs are
hyperexpanded with flattening of the hemidiaphragms.  Mild thoracic
spine degenerative changes.
IMPRESSION: Mild changes of COPD.  No acute abnormality.

## 2014-11-20 ENCOUNTER — Telehealth: Payer: Self-pay | Admitting: *Deleted

## 2014-11-20 NOTE — Telephone Encounter (Signed)
Call from [place], nurse at [facility]: Pt having nausea/ vomiting and no output from colostomy x2 days. She also reports he has ascites. Asking if he should be seen in office for this. Reviewed with Dr. Benay Spice: Pt is a hospice candidate. May need to see surgeon if colostomy is not draining. Spoke with Docia Barrier, RN with these instructions. She reports pt is "a no hospitalization." They will contact surgeon for evaluation of colostomy.

## 2014-12-04 DEATH — deceased
